# Patient Record
Sex: Male | Born: 1960 | Race: Black or African American | Hispanic: No | Smoking: Current every day smoker
Health system: Southern US, Community
[De-identification: ages and names within clinical notes are randomized; demographics above are authoritative.]

## PROBLEM LIST (undated history)

## (undated) DIAGNOSIS — K219 Gastro-esophageal reflux disease without esophagitis: Secondary | ICD-10-CM

## (undated) DIAGNOSIS — I639 Cerebral infarction, unspecified: Secondary | ICD-10-CM

## (undated) DIAGNOSIS — I10 Essential (primary) hypertension: Secondary | ICD-10-CM

## (undated) DIAGNOSIS — E119 Type 2 diabetes mellitus without complications: Secondary | ICD-10-CM

## (undated) DIAGNOSIS — E7800 Pure hypercholesterolemia, unspecified: Secondary | ICD-10-CM

## (undated) DIAGNOSIS — E785 Hyperlipidemia, unspecified: Secondary | ICD-10-CM

## (undated) DIAGNOSIS — F32A Depression, unspecified: Secondary | ICD-10-CM

## (undated) DIAGNOSIS — F419 Anxiety disorder, unspecified: Secondary | ICD-10-CM

## (undated) DIAGNOSIS — Z87442 Personal history of urinary calculi: Secondary | ICD-10-CM

## (undated) DIAGNOSIS — R0600 Dyspnea, unspecified: Secondary | ICD-10-CM

## (undated) HISTORY — PX: CAROTID STENT: SHX1301

## (undated) HISTORY — PX: EYE SURGERY: SHX253

---

## 2019-09-04 DIAGNOSIS — I639 Cerebral infarction, unspecified: Secondary | ICD-10-CM

## 2019-09-04 HISTORY — DX: Cerebral infarction, unspecified: I63.9

## 2020-04-20 ENCOUNTER — Emergency Department (HOSPITAL_COMMUNITY): Payer: Medicaid Other

## 2020-04-20 ENCOUNTER — Emergency Department (HOSPITAL_COMMUNITY): Payer: Medicaid Other | Admitting: Registered Nurse

## 2020-04-20 ENCOUNTER — Encounter (HOSPITAL_COMMUNITY)
Admission: EM | Disposition: A | Discharge status: Rehabilitation Facility | Payer: Self-pay | Source: Home / Self Care | Attending: Neurology

## 2020-04-20 ENCOUNTER — Inpatient Hospital Stay (HOSPITAL_COMMUNITY)
Admission: EM | Admit: 2020-04-20 | Discharge: 2020-04-23 | DRG: 023 | Disposition: A | Discharge status: Rehabilitation Facility | Payer: Medicaid Other | Attending: Neurology | Admitting: Neurology

## 2020-04-20 ENCOUNTER — Encounter (HOSPITAL_COMMUNITY): Payer: Self-pay | Admitting: Neurology

## 2020-04-20 ENCOUNTER — Inpatient Hospital Stay (HOSPITAL_COMMUNITY): Payer: Medicaid Other

## 2020-04-20 DIAGNOSIS — E876 Hypokalemia: Secondary | ICD-10-CM | POA: Diagnosis not present

## 2020-04-20 DIAGNOSIS — E1122 Type 2 diabetes mellitus with diabetic chronic kidney disease: Secondary | ICD-10-CM | POA: Diagnosis present

## 2020-04-20 DIAGNOSIS — I129 Hypertensive chronic kidney disease with stage 1 through stage 4 chronic kidney disease, or unspecified chronic kidney disease: Secondary | ICD-10-CM | POA: Diagnosis present

## 2020-04-20 DIAGNOSIS — N1832 Chronic kidney disease, stage 3b: Secondary | ICD-10-CM | POA: Diagnosis present

## 2020-04-20 DIAGNOSIS — F1410 Cocaine abuse, uncomplicated: Secondary | ICD-10-CM | POA: Diagnosis present

## 2020-04-20 DIAGNOSIS — I63511 Cerebral infarction due to unspecified occlusion or stenosis of right middle cerebral artery: Principal | ICD-10-CM | POA: Diagnosis present

## 2020-04-20 DIAGNOSIS — H5347 Heteronymous bilateral field defects: Secondary | ICD-10-CM | POA: Diagnosis present

## 2020-04-20 DIAGNOSIS — F17200 Nicotine dependence, unspecified, uncomplicated: Secondary | ICD-10-CM | POA: Diagnosis present

## 2020-04-20 DIAGNOSIS — R29714 NIHSS score 14: Secondary | ICD-10-CM | POA: Diagnosis present

## 2020-04-20 DIAGNOSIS — G8194 Hemiplegia, unspecified affecting left nondominant side: Secondary | ICD-10-CM | POA: Diagnosis present

## 2020-04-20 DIAGNOSIS — Z20822 Contact with and (suspected) exposure to covid-19: Secondary | ICD-10-CM | POA: Diagnosis present

## 2020-04-20 DIAGNOSIS — E785 Hyperlipidemia, unspecified: Secondary | ICD-10-CM | POA: Diagnosis present

## 2020-04-20 DIAGNOSIS — R531 Weakness: Secondary | ICD-10-CM | POA: Diagnosis present

## 2020-04-20 DIAGNOSIS — I609 Nontraumatic subarachnoid hemorrhage, unspecified: Secondary | ICD-10-CM | POA: Diagnosis present

## 2020-04-20 DIAGNOSIS — R29810 Facial weakness: Secondary | ICD-10-CM | POA: Diagnosis present

## 2020-04-20 DIAGNOSIS — I69354 Hemiplegia and hemiparesis following cerebral infarction affecting left non-dominant side: Secondary | ICD-10-CM | POA: Diagnosis not present

## 2020-04-20 HISTORY — PX: IR CT HEAD LTD: IMG2386

## 2020-04-20 HISTORY — PX: IR US GUIDE VASC ACCESS RIGHT: IMG2390

## 2020-04-20 HISTORY — PX: IR ANGIO EXTRACRAN SEL COM CAROTID INNOMINATE UNI L MOD SED: IMG5355

## 2020-04-20 HISTORY — PX: IR PERCUTANEOUS ART THROMBECTOMY/INFUSION INTRACRANIAL INC DIAG ANGIO: IMG6087

## 2020-04-20 HISTORY — PX: RADIOLOGY WITH ANESTHESIA: SHX6223

## 2020-04-20 LAB — URINALYSIS, ROUTINE W REFLEX MICROSCOPIC
Bacteria, UA: NONE SEEN
Bilirubin Urine: NEGATIVE
Glucose, UA: 150 mg/dL — AB
Ketones, ur: NEGATIVE mg/dL
Leukocytes,Ua: NEGATIVE
Nitrite: NEGATIVE
Protein, ur: >=300 mg/dL — AB
RBC / HPF: >50 RBC/hpf — ABNORMAL HIGH (ref 0–5)
Specific Gravity, Urine: 1.032 — ABNORMAL HIGH (ref 1.005–1.030)
pH: 5 (ref 5.0–8.0)

## 2020-04-20 LAB — RAPID URINE DRUG SCREEN, HOSP PERFORMED
Amphetamines: NOT DETECTED
Barbiturates: NOT DETECTED
Benzodiazepines: NOT DETECTED
Cocaine: POSITIVE — AB
Opiates: NOT DETECTED
Tetrahydrocannabinol: POSITIVE — AB

## 2020-04-20 LAB — CBC
HCT: 39.8 % (ref 39.0–52.0)
HCT: 41.1 % (ref 39.0–52.0)
Hemoglobin: 13.4 g/dL (ref 13.0–17.0)
Hemoglobin: 13.6 g/dL (ref 13.0–17.0)
MCH: 31.4 pg (ref 26.0–34.0)
MCH: 31.7 pg (ref 26.0–34.0)
MCHC: 33.1 g/dL (ref 30.0–36.0)
MCHC: 33.7 g/dL (ref 30.0–36.0)
MCV: 94.1 fL (ref 80.0–100.0)
MCV: 94.9 fL (ref 80.0–100.0)
Platelets: 338 10*3/uL (ref 150–400)
Platelets: 356 10*3/uL (ref 150–400)
RBC: 4.23 MIL/uL (ref 4.22–5.81)
RBC: 4.33 MIL/uL (ref 4.22–5.81)
RDW: 13.3 % (ref 11.5–15.5)
RDW: 13.4 % (ref 11.5–15.5)
WBC: 10.4 10*3/uL (ref 4.0–10.5)
WBC: 7.2 10*3/uL (ref 4.0–10.5)
nRBC: 0 % (ref 0.0–0.2)
nRBC: 0 % (ref 0.0–0.2)

## 2020-04-20 LAB — COMPREHENSIVE METABOLIC PANEL
ALT: 18 U/L (ref 0–44)
ALT: 23 U/L (ref 0–44)
AST: 27 U/L (ref 15–41)
AST: 34 U/L (ref 15–41)
Albumin: 2.6 g/dL — ABNORMAL LOW (ref 3.5–5.0)
Albumin: 2.5 g/dL — ABNORMAL LOW (ref 3.5–5.0)
Alkaline Phosphatase: 67 U/L (ref 38–126)
Alkaline Phosphatase: 68 U/L (ref 38–126)
Anion gap: 7 (ref 5–15)
Anion gap: 7 (ref 5–15)
BUN: 17 mg/dL (ref 6–20)
BUN: 17 mg/dL (ref 6–20)
CO2: 26 mmol/L (ref 22–32)
CO2: 23 mmol/L (ref 22–32)
Calcium: 8.6 mg/dL — ABNORMAL LOW (ref 8.9–10.3)
Calcium: 8 mg/dL — ABNORMAL LOW (ref 8.9–10.3)
Chloride: 102 mmol/L (ref 98–111)
Chloride: 102 mmol/L (ref 98–111)
Creatinine, Ser: 2.1 mg/dL — ABNORMAL HIGH (ref 0.61–1.24)
Creatinine, Ser: 2.01 mg/dL — ABNORMAL HIGH (ref 0.61–1.24)
GFR, Estimated: 36 mL/min — ABNORMAL LOW (ref >60)
GFR, Estimated: 38 mL/min — ABNORMAL LOW (ref >60)
Glucose, Bld: 231 mg/dL — ABNORMAL HIGH (ref 70–99)
Glucose, Bld: 316 mg/dL — ABNORMAL HIGH (ref 70–99)
Potassium: 4.1 mmol/L (ref 3.5–5.1)
Potassium: 4.3 mmol/L (ref 3.5–5.1)
Sodium: 135 mmol/L (ref 135–145)
Sodium: 132 mmol/L — ABNORMAL LOW (ref 135–145)
Total Bilirubin: 0.7 mg/dL (ref 0.3–1.2)
Total Bilirubin: 0.6 mg/dL (ref 0.3–1.2)
Total Protein: 5.9 g/dL — ABNORMAL LOW (ref 6.5–8.1)
Total Protein: 5.4 g/dL — ABNORMAL LOW (ref 6.5–8.1)

## 2020-04-20 LAB — APTT: aPTT: 32 seconds (ref 24–36)

## 2020-04-20 LAB — RESP PANEL BY RT-PCR (FLU A&B, COVID) ARPGX2
Influenza A by PCR: NEGATIVE
Influenza B by PCR: NEGATIVE
SARS Coronavirus 2 by RT PCR: NEGATIVE

## 2020-04-20 LAB — ECHOCARDIOGRAM COMPLETE BUBBLE STUDY
Area-P 1/2: 2.87 cm2
Calc EF: 48.1 %
S' Lateral: 2.5 cm
Single Plane A2C EF: 49.6 %
Single Plane A4C EF: 47 %

## 2020-04-20 LAB — ETHANOL: Alcohol, Ethyl (B): <10 mg/dL (ref <10)

## 2020-04-20 LAB — LIPID PANEL
Cholesterol: 229 mg/dL — ABNORMAL HIGH (ref 0–200)
HDL: 83 mg/dL (ref >40)
LDL Cholesterol: 98 mg/dL (ref 0–99)
Total CHOL/HDL Ratio: 2.8 RATIO
Triglycerides: 238 mg/dL — ABNORMAL HIGH (ref <150)
VLDL: 48 mg/dL — ABNORMAL HIGH (ref 0–40)

## 2020-04-20 LAB — DIFFERENTIAL
Abs Immature Granulocytes: 0.02 10*3/uL (ref 0.00–0.07)
Basophils Absolute: 0 10*3/uL (ref 0.0–0.1)
Basophils Relative: 0 %
Eosinophils Absolute: 0.2 10*3/uL (ref 0.0–0.5)
Eosinophils Relative: 3 %
Immature Granulocytes: 0 %
Lymphocytes Relative: 22 %
Lymphs Abs: 1.6 10*3/uL (ref 0.7–4.0)
Monocytes Absolute: 0.5 10*3/uL (ref 0.1–1.0)
Monocytes Relative: 7 %
Neutro Abs: 4.8 10*3/uL (ref 1.7–7.7)
Neutrophils Relative %: 68 %

## 2020-04-20 LAB — I-STAT CHEM 8, ED
BUN: 19 mg/dL (ref 6–20)
Calcium, Ion: 1.05 mmol/L — ABNORMAL LOW (ref 1.15–1.40)
Chloride: 101 mmol/L (ref 98–111)
Creatinine, Ser: 1.9 mg/dL — ABNORMAL HIGH (ref 0.61–1.24)
Glucose, Bld: 227 mg/dL — ABNORMAL HIGH (ref 70–99)
HCT: 40 % (ref 39.0–52.0)
Hemoglobin: 13.6 g/dL (ref 13.0–17.0)
Potassium: 3.9 mmol/L (ref 3.5–5.1)
Sodium: 139 mmol/L (ref 135–145)
TCO2: 26 mmol/L (ref 22–32)

## 2020-04-20 LAB — GLUCOSE, CAPILLARY
Glucose-Capillary: 242 mg/dL — ABNORMAL HIGH (ref 70–99)
Glucose-Capillary: 278 mg/dL — ABNORMAL HIGH (ref 70–99)
Glucose-Capillary: 268 mg/dL — ABNORMAL HIGH (ref 70–99)
Glucose-Capillary: 170 mg/dL — ABNORMAL HIGH (ref 70–99)
Glucose-Capillary: 62 mg/dL — ABNORMAL LOW (ref 70–99)

## 2020-04-20 LAB — CBG MONITORING, ED: Glucose-Capillary: 208 mg/dL — ABNORMAL HIGH (ref 70–99)

## 2020-04-20 LAB — PROTIME-INR
INR: 0.9 (ref 0.8–1.2)
Prothrombin Time: 11.7 seconds (ref 11.4–15.2)

## 2020-04-20 LAB — HEMOGLOBIN A1C
Hgb A1c MFr Bld: 7.3 % — ABNORMAL HIGH (ref 4.8–5.6)
Mean Plasma Glucose: 162.81 mg/dL

## 2020-04-20 LAB — MRSA PCR SCREENING: MRSA by PCR: NEGATIVE

## 2020-04-20 SURGERY — IR WITH ANESTHESIA
Anesthesia: General

## 2020-04-20 MED ORDER — VERAPAMIL HCL 2.5 MG/ML IV SOLN
INTRAVENOUS | Status: AC
  Filled 2020-04-20: qty 2

## 2020-04-20 MED ORDER — NITROGLYCERIN 1 MG/10 ML FOR IR/CATH LAB
INTRA_ARTERIAL | Status: AC
  Filled 2020-04-20: qty 10

## 2020-04-20 MED ORDER — LACTATED RINGERS IV SOLN: INTRAVENOUS | Status: DC | PRN

## 2020-04-20 MED ORDER — ASPIRIN 81 MG PO CHEW
81.0000 mg | CHEWABLE_TABLET | Freq: Every day | ORAL | Status: DC
  Administered 2020-04-21: 81 mg via ORAL
  Filled 2020-04-20: qty 1

## 2020-04-20 MED ORDER — CLEVIDIPINE BUTYRATE 0.5 MG/ML IV EMUL
INTRAVENOUS | Status: AC
  Filled 2020-04-20: qty 50

## 2020-04-20 MED ORDER — ESMOLOL HCL 100 MG/10ML IV SOLN
INTRAVENOUS | Status: DC | PRN
  Administered 2020-04-20 (×3): 20 mg via INTRAVENOUS

## 2020-04-20 MED ORDER — IOHEXOL 300 MG/ML  SOLN: 150.0000 mL | Freq: Once | INTRAMUSCULAR | Status: DC | PRN

## 2020-04-20 MED ORDER — FENTANYL CITRATE (PF) 250 MCG/5ML IJ SOLN
INTRAMUSCULAR | Status: AC
  Filled 2020-04-20: qty 5

## 2020-04-20 MED ORDER — IOHEXOL 350 MG/ML SOLN
100.0000 mL | Freq: Once | INTRAVENOUS | Status: AC | PRN
  Administered 2020-04-20: 100 mL via INTRAVENOUS

## 2020-04-20 MED ORDER — ASPIRIN 300 MG RE SUPP: 300.0000 mg | Freq: Once | RECTAL | Status: DC

## 2020-04-20 MED ORDER — CHLORHEXIDINE GLUCONATE CLOTH 2 % EX PADS
6.0000 | MEDICATED_PAD | Freq: Every day | CUTANEOUS | Status: DC
  Administered 2020-04-20 – 2020-04-23 (×4): 6 via TOPICAL

## 2020-04-20 MED ORDER — ROCURONIUM BROMIDE 10 MG/ML (PF) SYRINGE
PREFILLED_SYRINGE | INTRAVENOUS | Status: DC | PRN
  Administered 2020-04-20: 20 mg via INTRAVENOUS
  Administered 2020-04-20: 10 mg via INTRAVENOUS
  Administered 2020-04-20: 50 mg via INTRAVENOUS

## 2020-04-20 MED ORDER — DEXAMETHASONE SODIUM PHOSPHATE 10 MG/ML IJ SOLN
INTRAMUSCULAR | Status: DC | PRN
  Administered 2020-04-20: 10 mg via INTRAVENOUS

## 2020-04-20 MED ORDER — CLOPIDOGREL BISULFATE 300 MG PO TABS
ORAL_TABLET | ORAL | Status: AC
  Filled 2020-04-20: qty 1

## 2020-04-20 MED ORDER — CLEVIDIPINE BUTYRATE 0.5 MG/ML IV EMUL
0.0000 mg/h | INTRAVENOUS | Status: DC
  Administered 2020-04-20: 16 mg/h via INTRAVENOUS
  Administered 2020-04-20: 18 mg/h via INTRAVENOUS
  Administered 2020-04-20: 16 mg/h via INTRAVENOUS
  Administered 2020-04-20: 15 mg/h via INTRAVENOUS
  Administered 2020-04-21 (×5): 16 mg/h via INTRAVENOUS
  Administered 2020-04-21: 4 mg/h via INTRAVENOUS
  Administered 2020-04-22: 1 mg/h via INTRAVENOUS
  Filled 2020-04-20 (×10): qty 100
  Filled 2020-04-20: qty 50
  Filled 2020-04-20 (×2): qty 100
  Filled 2020-04-20: qty 50
  Filled 2020-04-20: qty 100

## 2020-04-20 MED ORDER — SODIUM CHLORIDE 0.9 % IV SOLN: INTRAVENOUS | Status: DC | PRN

## 2020-04-20 MED ORDER — INSULIN ASPART 100 UNIT/ML ~~LOC~~ SOLN
0.0000 [IU] | SUBCUTANEOUS | Status: DC
  Administered 2020-04-20: 3 [IU] via SUBCUTANEOUS
  Administered 2020-04-20: 8 [IU] via SUBCUTANEOUS
  Administered 2020-04-21 (×4): 2 [IU] via SUBCUTANEOUS
  Administered 2020-04-22: 5 [IU] via SUBCUTANEOUS
  Administered 2020-04-22 – 2020-04-23 (×2): 3 [IU] via SUBCUTANEOUS
  Administered 2020-04-23 (×2): 2 [IU] via SUBCUTANEOUS

## 2020-04-20 MED ORDER — ACETAMINOPHEN 160 MG/5ML PO SOLN: 650.0000 mg | ORAL | Status: DC | PRN

## 2020-04-20 MED ORDER — IOHEXOL 240 MG/ML SOLN
150.0000 mL | Freq: Once | INTRAMUSCULAR | Status: AC | PRN
  Administered 2020-04-20: 25 mL via INTRA_ARTERIAL

## 2020-04-20 MED ORDER — SUGAMMADEX SODIUM 200 MG/2ML IV SOLN
INTRAVENOUS | Status: DC | PRN
  Administered 2020-04-20: 200 mg via INTRAVENOUS

## 2020-04-20 MED ORDER — FENTANYL CITRATE (PF) 100 MCG/2ML IJ SOLN: 25.0000 ug | INTRAMUSCULAR | Status: DC | PRN

## 2020-04-20 MED ORDER — SENNOSIDES-DOCUSATE SODIUM 8.6-50 MG PO TABS
1.0000 | ORAL_TABLET | Freq: Every evening | ORAL | Status: DC | PRN
  Administered 2020-04-21 – 2020-04-22 (×2): 1 via ORAL
  Filled 2020-04-20 (×2): qty 1

## 2020-04-20 MED ORDER — ACETAMINOPHEN 325 MG PO TABS
650.0000 mg | ORAL_TABLET | ORAL | Status: DC | PRN
  Administered 2020-04-21 – 2020-04-22 (×2): 650 mg via ORAL
  Filled 2020-04-20 (×2): qty 2

## 2020-04-20 MED ORDER — LIDOCAINE 2% (20 MG/ML) 5 ML SYRINGE
INTRAMUSCULAR | Status: DC | PRN
  Administered 2020-04-20: 50 mg via INTRAVENOUS

## 2020-04-20 MED ORDER — CLEVIDIPINE BUTYRATE 0.5 MG/ML IV EMUL
INTRAVENOUS | Status: DC | PRN
  Administered 2020-04-20: 1 mg/h via INTRAVENOUS

## 2020-04-20 MED ORDER — ACETAMINOPHEN 650 MG RE SUPP: 650.0000 mg | RECTAL | Status: DC | PRN

## 2020-04-20 MED ORDER — SODIUM CHLORIDE 0.9 % IV SOLN: INTRAVENOUS | Status: DC

## 2020-04-20 MED ORDER — TIROFIBAN HCL IN NACL 5-0.9 MG/100ML-% IV SOLN
INTRAVENOUS | Status: AC
  Filled 2020-04-20: qty 100

## 2020-04-20 MED ORDER — ONDANSETRON HCL 4 MG/2ML IJ SOLN
INTRAMUSCULAR | Status: DC | PRN
  Administered 2020-04-20: 4 mg via INTRAVENOUS

## 2020-04-20 MED ORDER — HYDRALAZINE HCL 25 MG PO TABS
25.0000 mg | ORAL_TABLET | Freq: Every day | ORAL | Status: DC
  Administered 2020-04-20 – 2020-04-21 (×2): 25 mg via ORAL
  Filled 2020-04-20 (×2): qty 1

## 2020-04-20 MED ORDER — SODIUM CHLORIDE 0.9% FLUSH
10.0000 mL | Freq: Once | INTRAVENOUS | Status: AC
  Administered 2020-04-20: 10 mL via INTRAVENOUS

## 2020-04-20 MED ORDER — DEXTROSE 50 % IV SOLN
INTRAVENOUS | Status: AC
  Administered 2020-04-20: 25 mL
  Filled 2020-04-20: qty 50

## 2020-04-20 MED ORDER — FENTANYL CITRATE (PF) 250 MCG/5ML IJ SOLN
INTRAMUSCULAR | Status: DC | PRN
  Administered 2020-04-20: 50 ug via INTRAVENOUS

## 2020-04-20 MED ORDER — PHENYLEPHRINE 40 MCG/ML (10ML) SYRINGE FOR IV PUSH (FOR BLOOD PRESSURE SUPPORT)
PREFILLED_SYRINGE | INTRAVENOUS | Status: DC | PRN
  Administered 2020-04-20 (×2): 80 ug via INTRAVENOUS

## 2020-04-20 MED ORDER — PHENYLEPHRINE HCL-NACL 10-0.9 MG/250ML-% IV SOLN
INTRAVENOUS | Status: DC | PRN
  Administered 2020-04-20: 25 ug/min via INTRAVENOUS

## 2020-04-20 MED ORDER — TICAGRELOR 90 MG PO TABS
ORAL_TABLET | ORAL | Status: AC
  Filled 2020-04-20: qty 2

## 2020-04-20 MED ORDER — CEFAZOLIN SODIUM-DEXTROSE 2-3 GM-%(50ML) IV SOLR
INTRAVENOUS | Status: DC | PRN
  Administered 2020-04-20: 2 g via INTRAVENOUS

## 2020-04-20 MED ORDER — CEFAZOLIN SODIUM-DEXTROSE 2-4 GM/100ML-% IV SOLN
INTRAVENOUS | Status: AC
  Filled 2020-04-20: qty 100

## 2020-04-20 MED ORDER — PROPOFOL 10 MG/ML IV BOLUS
INTRAVENOUS | Status: DC | PRN
  Administered 2020-04-20: 200 mg via INTRAVENOUS
  Administered 2020-04-20: 25 mg via INTRAVENOUS

## 2020-04-20 MED ORDER — STROKE: EARLY STAGES OF RECOVERY BOOK: Freq: Once | Status: DC

## 2020-04-20 MED ORDER — CANGRELOR TETRASODIUM 50 MG IV SOLR
INTRAVENOUS | Status: AC
  Filled 2020-04-20: qty 50

## 2020-04-20 MED ORDER — ASPIRIN 81 MG PO CHEW
CHEWABLE_TABLET | ORAL | Status: AC
  Filled 2020-04-20: qty 1

## 2020-04-20 MED ORDER — AMLODIPINE BESYLATE 10 MG PO TABS
10.0000 mg | ORAL_TABLET | Freq: Every day | ORAL | Status: DC
  Administered 2020-04-20 – 2020-04-23 (×4): 10 mg via ORAL
  Filled 2020-04-20 (×4): qty 1

## 2020-04-20 MED ORDER — IOHEXOL 240 MG/ML SOLN
150.0000 mL | Freq: Once | INTRAMUSCULAR | Status: AC | PRN
  Administered 2020-04-20: 75 mL via INTRA_ARTERIAL

## 2020-04-20 MED ORDER — EPTIFIBATIDE 20 MG/10ML IV SOLN
INTRAVENOUS | Status: AC
  Filled 2020-04-20: qty 10

## 2020-04-20 MED ORDER — IOHEXOL 240 MG/ML SOLN
INTRAMUSCULAR | Status: AC
  Filled 2020-04-20: qty 200

## 2020-04-20 MED ORDER — ENOXAPARIN SODIUM 40 MG/0.4ML ~~LOC~~ SOLN: 40.0000 mg | SUBCUTANEOUS | Status: DC

## 2020-04-20 MED ORDER — SUCCINYLCHOLINE CHLORIDE 200 MG/10ML IV SOSY
PREFILLED_SYRINGE | INTRAVENOUS | Status: DC | PRN
  Administered 2020-04-20: 100 mg via INTRAVENOUS

## 2020-04-20 MED ORDER — CLEVIDIPINE BUTYRATE 0.5 MG/ML IV EMUL
INTRAVENOUS | Status: AC
  Administered 2020-04-20: 15 mg
  Filled 2020-04-20: qty 50
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
## 2020-04-20 NOTE — ED Notes (Signed)
Patient taken to IR

## 2020-04-20 NOTE — Evaluation (Signed)
Clinical/Bedside Swallow Evaluation Patient Details  Name: Nataniel Pessolano MRN: [id number] Date of Birth: [birth year]  Today's Date: 04/20/2020 Time: SLP Start Time (ACUTE ONLY): [id number] SLP Stop Time (ACUTE ONLY): 1601 SLP Time Calculation (min) (ACUTE ONLY): 13.88 min  Past Medical History:  Past Medical History:  Diagnosis Date  . Stroke [facility]) 09/2019   Past Surgical History:  Past Surgical History:  Procedure Laterality Date  . IR ANGIO EXTRACRAN SEL COM CAROTID INNOMINATE UNI L MOD SED  04/20/2020  . IR CT HEAD LTD  04/20/2020  . IR PERCUTANEOUS ART THROMBECTOMY/INFUSION INTRACRANIAL INC DIAG ANGIO  04/20/2020  . IR US GUIDE VASC ACCESS RIGHT  04/20/2020   HPI:  Pt is a 60 y.o. male with PMH significant for prior stroke in august 2021 with very mild residual deficit after finishing rehab. Pt presented with acute onset left-sided weakness. CT head 3/18: Asymmetric hyperdensity involving right M2/M3 branches, concerning for thrombus. CTA 3/18: abrupt occlusion of the proximal right ICA just beyond the right bifurcation. 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 Pt s/p cerebral arteriogram with emergent mechanical thrombectomy of right ICA bifurcation occlusion and right MCA M2 occlusion and revascularization on 3/18. Pt passed the [facility] swallow screen on 3/18 and swallow evaluation was subsequently ordered due to anterior spillage of puree as noted by provider.   Assessment / Plan / Recommendation Clinical Impression  Pt was seen for bedside swallow evaluation. He reported anterior spillage and oral residue with since prior CVA, but denied any s/sx of aspiration with p.o. intake. Oral mechanism exam was significant for left-sided facial weakness and reduced lingual ROM. Dentition was reduced, but adequate for mastication.  He demonstrated symptoms of oral phase dysphagia characterized by left-sided anterior spillage of thin liquids via cup  and mild oral residue. Mastication was [facility] and complete oral clearance was facilitated with secondary swallows and/or a liquid wash. Pt reported that hard lettuce occasionally "gets hung up", but no symptoms of pharyngeal dysphagia were observed during the evaluation even when challenged. A regular texture diet with thin liquids is recommended at this time and SLP will follow to assess diet tolerance. SLP Visit Diagnosis: Dysphagia, unspecified (R13.10)    Aspiration Risk  Mild aspiration risk    Diet Recommendation Regular;Thin liquid   Liquid Administration via: Cup;Straw Medication Administration: Whole meds with puree Supervision: Staff to assist with self feeding Compensations: Slow rate;Small sips/bites Postural Changes: Seated upright at 90 degrees    Other  Recommendations Oral Care Recommendations: Oral care BID   Follow up Recommendations  (TBD)      Frequency and Duration min 1 x/week  1 week       Prognosis Prognosis for Safe Diet Advancement: Good Barriers to Reach Goals: Time post onset      Swallow Study   General Date of Onset: 04/19/20 HPI: Pt is a 60 y.o. male with PMH significant for prior stroke in august 2021 with very mild residual deficit after finishing rehab. Pt presented with acute onset left-sided weakness. CT head 3/18: Asymmetric hyperdensity involving right M2/M3 branches, concerning for thrombus. CTA 3/18: abrupt occlusion of the proximal right ICA just beyond the right bifurcation. 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 Pt s/p cerebral arteriogram with emergent mechanical thrombectomy of right ICA bifurcation occlusion and right MCA M2 occlusion and revascularization on 3/18. Pt passed the [facility] swallow screen on 3/18 and swallow evaluation was subsequently ordered due  to anterior spillage of puree as noted by provider. Type of Study: Bedside Swallow Evaluation Previous Swallow Assessment:  None Diet Prior to this Study: Regular;Thin liquids Temperature Spikes Noted: No Respiratory Status: Room air History of Recent Intubation: No Behavior/Cognition: Alert;Cooperative;Pleasant mood Oral Cavity Assessment: Within Functional Limits Oral Care Completed by SLP: No Oral Cavity - Dentition: Missing dentition Vision: Functional for self-feeding Self-Feeding Abilities: Needs assist Patient Positioning: Upright in bed Baseline Vocal Quality: Normal Volitional Cough: Strong Volitional Swallow: Able to elicit    Oral/Motor/Sensory Function Overall Oral Motor/Sensory Function: Mild impairment Facial ROM: Suspected CN VII (facial) dysfunction;Reduced left Facial Symmetry: Abnormal symmetry left;Suspected CN VII (facial) dysfunction Facial Strength: Reduced left;Suspected CN VII (facial) dysfunction Facial Sensation: Within Functional Limits Lingual ROM: Reduced left;Suspected CN XII (hypoglossal) dysfunction Lingual Symmetry: Abnormal symmetry left;Suspected CN XII (hypoglossal) dysfunction Lingual Strength: Reduced;Suspected CN XII (hypoglossal) dysfunction Lingual Sensation: Within Functional Limits Velum: Within Functional Limits Mandible: Within Functional Limits   Ice Chips Ice chips: Within functional limits Presentation: Spoon   Thin Liquid Thin Liquid: Impaired Presentation: Straw;Cup Oral Phase Impairments: Reduced labial seal Oral Phase Functional Implications: Left anterior spillage    Nectar Thick Nectar Thick Liquid: Not tested   Honey Thick Honey Thick Liquid: Not tested   Puree Puree: Impaired Presentation: Spoon Oral Phase Impairments: Reduced labial seal Oral Phase Functional Implications:  (residue in left lateral sulcus)   Solid     Solid: Within functional limits Presentation: Self Fed     Shanika I. Hardin Negus, [place], [place] Office number [phone] Pager [phone]   Horton Marshall 04/20/2020,4:43  PM

## 2020-04-20 NOTE — Progress Notes (Signed)
  Echocardiogram 2D Echocardiogram with bubble has been performed.  Bobby Branch M 04/20/2020, 3:49 PM

## 2020-04-20 NOTE — Anesthesia Preprocedure Evaluation (Addendum)
Anesthesia Evaluation  Patient identified by MRN, date of birth, ID band Patient awake    Reviewed: Allergy & Precautions, H&P , NPO status , Patient's Chart, lab work & pertinent test results  Airway Mallampati: II  TM Distance: >3 FB Neck ROM: Full    Dental no notable dental hx. (+) Teeth Intact, Dental Advisory Given   Pulmonary neg pulmonary ROS,    Pulmonary exam normal breath sounds clear to auscultation       Cardiovascular negative cardio ROS   Rhythm:Regular Rate:Normal     Neuro/Psych CVA, Residual Symptoms negative psych ROS   GI/Hepatic negative GI ROS, Neg liver ROS,   Endo/Other  negative endocrine ROS  Renal/GU negative Renal ROS  negative genitourinary   Musculoskeletal   Abdominal   Peds  Hematology negative hematology ROS (+)   Anesthesia Other Findings   Reproductive/Obstetrics negative OB ROS                            Anesthesia Physical Anesthesia Plan  ASA: III and emergent  Anesthesia Plan: General   Post-op Pain Management:    Induction: Intravenous, Rapid sequence and Cricoid pressure planned  PONV Risk Score and Plan: 2 and Ondansetron and Dexamethasone  Airway Management Planned: Oral ETT  Additional Equipment: Arterial line  Intra-op Plan:   Post-operative Plan: Extubation in OR  Informed Consent: I have reviewed the patients History and Physical, chart, labs and discussed the procedure including the risks, benefits and alternatives for the proposed anesthesia with the patient or authorized representative who has indicated his/her understanding and acceptance.     Dental advisory given  Plan Discussed with: CRNA  Anesthesia Plan Comments:        Anesthesia Quick Evaluation

## 2020-04-20 NOTE — Progress Notes (Signed)
NeuroInterventional Radiology  Pre-Procedure Note  History: Bobby Branch a 60 y.o.malewith PMH significant for prior stroke in august 2021 with very mild residual deficit after finishing rehab who presents with acute onset Left sided weakness with extinction and a LKW of 1830.  Patient was dropped off at his apartment at [place] and was found down by family and [employer] with left sided weakness. He was brought in as a stroke code.   NIHSS:  14  CT ASPECTS: 10 CTA:   Tandem occlusion, right ICA and right MCA/M2 CTP:   Core (CBF) = 15cc, Penumbra (Tmax) = 82cc   I have discussed the case with Dr. Lorrin Goodell of Stroke Neurology.  Given the patient's symptoms, imaging findings, baseline function, we agree they are an appropriate candidate for attempt for mechanical thrombectomy.    The risks and benefits of the procedure were discussed with the patient/patient's family, with specific risks including: bleeding, infection, arterial injury/dissection, contrast reaction, kidney injury, need for further procedure/surgery, neurologic deficit, 10-15% risk of intracranial hemorrhage, cardiopulmonary collapse, death. All questions were answered.  The patient/family would like to proceed with attempt at thrombectomy.   Given the right ICA occlusion, there will likely be need for acute carotid stent.   Plan for cerebral angiogram and attempt at mechanical thrombectomy.   Signed,   Dulcy Fanny. Earleen Newport, DO
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
## 2020-04-20 NOTE — Progress Notes (Signed)
OT Cancellation Note  Patient Details Name: Bobby Branch MRN: [id number] DOB: 06/16/1960   Cancelled Treatment:    Reason Eval/Treat Not Completed: Patient not medically ready;Patient at procedure or test/ unavailable (IR)  WARD,HILLARY 04/20/2020, 8:43 AM  Maurie Boettcher, OT/L   Acute OT Clinical Specialist Acute Rehabilitation Services Pager [phone] Office [phone]

## 2020-04-20 NOTE — Anesthesia Postprocedure Evaluation (Signed)
Anesthesia Post Note  Patient: Bobby Branch  Procedure(s) Performed: IR WITH ANESTHESIA (N/A )     Patient location during evaluation: PACU Anesthesia Type: General Level of consciousness: awake and alert Pain management: pain level controlled Vital Signs Assessment: post-procedure vital signs reviewed and stable Respiratory status: spontaneous breathing, nonlabored ventilation, respiratory function stable and patient connected to nasal cannula oxygen Cardiovascular status: blood pressure returned to baseline and stable Postop Assessment: no apparent nausea or vomiting Anesthetic complications: no   No complications documented.  Last Vitals:  Vitals:   04/20/20 0709 04/20/20 0724  BP: 136/82 137/78  Pulse: 84 86  Resp: 14 12  Temp:    SpO2: 100% 100%    Last Pain:  Vitals:   04/20/20 0709  TempSrc:   PainSc: 0-No pain                 Osie Merkin,W. EDMOND

## 2020-04-20 NOTE — Anesthesia Procedure Notes (Signed)
Arterial Line Insertion Start/End3/18/2022 1:25 AM, 04/20/2020 1:28 AM Performed by: Jearld Pies, CRNA, CRNA  Patient location: OOR procedure area. Emergency situation Patient sedated Right, radial was placed Catheter size: 20 G Hand hygiene performed  and Seldinger technique used Allen's test indicative of satisfactory collateral circulation Attempts: 1 Procedure performed without using ultrasound guided technique. Following insertion, dressing applied and Biopatch. Post procedure assessment: normal  Patient tolerated the procedure well with no immediate complications.

## 2020-04-20 NOTE — Progress Notes (Signed)
Referring Physician(s): Code stroke- Donnetta Simpers (neurology)  Supervising Physician: Corrie Mckusick  Patient Status:  [facility] - In-pt  Chief Complaint: Stroke follow-up  Subjective:  History of acute CVA s/p cerebral arteriogram with emergent mechanical thrombectomy of right ICA bifurcation occlusion and right MCA M2 occlusion achieving a TICI 3 revascularization via right femoral approach 04/20/2020 by Dr. Earleen Newport. Patient laying in bed resting comfortably. He responds to voice and follows simple commands. No complaints. Left facial droop. Can spontaneously move right side, minimal movements of left side (left hand grip intact, LLE withdraws from pain). Right femoral puncture site c/d/i.   Allergies: Patient has no known allergies.  Medications: Prior to Admission medications   Not on File     Vital Signs: BP 137/78 (BP Location: Right Arm)   Pulse 76   Temp 97.6 F (36.4 C)   Resp 14   SpO2 98%   Physical Exam Vitals and nursing note reviewed.  Constitutional:      General: He is not in acute distress. Pulmonary:     Effort: Pulmonary effort is normal. No respiratory distress.  Skin:    General: Skin is warm and dry.     Comments: Right femoral puncture site soft without active bleeding or hematoma.  Neurological:     Mental Status: He is alert.     Comments: Alert, awake, and oriented x3. Speech and comprehension intact. PERRL bilaterally. EOMs intact bilaterally without nystagmus or subjective diplopia. Left facial droop. minimal movements of left side (left hand grip intact, LLE withdraws from pain). Distal pulses (DPs) 2+ bilaterally.     Imaging: IR CT Head [facility]  Result Date: 04/20/2020 INDICATION: 60 year old male with history acute right MCA syndrome, presents for attempt at mechanical thrombectomy and treatment. EXAM: ULTRASOUND-GUIDED ACCESS RIGHT COMMON FEMORAL ARTERY RIGHT-SIDED CERVICAL AND CEREBRAL ANGIOGRAM LEFT-SIDED CERVICAL ANGIOGRAM  BALLOON ANGIOPLASTY OCCLUDED RIGHT CERVICAL ICA MECHANICAL THROMBECTOMY RIGHT MCA M2 DIVISION ANGIO-SEAL FOR HEMOSTASIS COMPARISON:  CT imaging same day MEDICATIONS: 300 mg rectal aspirin ANESTHESIA/SEDATION: The anesthesia team was present to provide general endotracheal tube anesthesia and for patient monitoring during the procedure. Intubation was performed in negative pressure Bay in neuro IR holding. Left radial arterial line was performed by the anesthesia team. Interventional neuro radiology nursing staff was also present. CONTRAST:  100 cc IV contrast FLUOROSCOPY TIME:  Fluoroscopy Time: 18 minutes 12 seconds (889 mGy). COMPLICATIONS: SIR LEVEL B - Normal therapy, includes overnight admission for observation. Right-sided sylvian subarachnoid hemorrhage TECHNIQUE: Informed written consent was obtained from the patient's family after a thorough discussion of the procedural risks, benefits and alternatives. Specific risks discussed include: Bleeding, infection, contrast reaction, kidney injury/failure, need for further procedure/surgery, arterial injury or dissection, embolization to new territory, intracranial hemorrhage (10-15% risk), neurologic deterioration, cardiopulmonary collapse, death. All questions were addressed. Maximal Sterile Barrier Technique was utilized including during the procedure including caps, mask, sterile gowns, sterile gloves, sterile drape, hand hygiene and skin antiseptic. A timeout was performed prior to the initiation of the procedure. The anesthesia team was present to provide general endotracheal tube anesthesia and for patient monitoring during the procedure. Interventional neuro radiology nursing staff was also present. FINDINGS: Initial Findings: Right common carotid artery:  Normal course caliber and contour. Right external carotid artery: Patent with antegrade flow. Right internal carotid artery: Right ICA occluded at the cervical ICA. Calcified plaque present at the bulb.  Right MCA: After initial crossing and angioplasty of the cervical ICA, repeat angiogram was performed at the skull  base. This demonstrated a transient M1 occlusion, which then again migrated distally into the inferior division. Initial perfusion of the targeted vessel is TICI 0: No perfusion or antegrade flow beyond site of occlusion Right ACA:  A 1 segment patent. Completion Findings: Right MCA: Patent. The targeted artery, inferior division, dominant artery to the parietal region, final score of TICI 3: Complete perfusion of the territory Flat panel CT demonstrates small volume subarachnoid hemorrhage in the right sylvian sulci. Final angiogram of the cervical ICA after balloon angioplasty demonstrates persisting stenosis, measured 42% by NASCET CT criteria. TICI 3 Flat panel CT Findings: Left common carotid artery:  Normal course caliber and contour. Left external carotid artery: Patent with antegrade flow. Left internal carotid artery: Irregular string sign at the proximal left cervical ICA, with high-grade stenosis. NASCET criteria measurement is 79% stenosis PROCEDURE: The anesthesia team was present to provide general endotracheal tube anesthesia and for patient monitoring during the procedure. Intubation was performed in negative pressure Bay in neuro IR holding. Interventional neuro radiology nursing staff was also present. Ultrasound survey of the right inguinal region was performed with images stored and sent to PACs. 11 blade scalpel was used to make a small incision. Blunt dissection was performed with US guidance. A micropuncture needle was used access the right common femoral artery under ultrasound. With excellent arterial blood flow returned, an .018 micro wire was passed through the needle, observed to enter the abdominal aorta under fluoroscopy. The needle was removed, and a micropuncture sheath was placed over the wire. The inner dilator and wire were removed, and an 035 wire was advanced under  fluoroscopy into the abdominal aorta. The sheath was removed and a 25cm 63F straight vascular sheath was placed. The dilator was removed and the sheath was flushed. Sheath was attached to pressurized and heparinized saline bag for constant forward flow. 125 cm parents stent diagnostic catheter was used to select the innominate artery. The catheter was advanced into the common carotid artery using a roadrunner diagnostic wire. Wire was removed and angiogram was performed in the distal common carotid artery. The catheter was then directed towards the external carotid artery and a rose in wire was placed. A 95cm 087 "Walrus" balloon guide was then advanced on the Rose an wire. Once the balloon guide was position in the distal cervical ICA, rose in wire was removed. Baseline angiogram was again performed with roadmap achieved. A rapid transit microcatheter with a coaxial synchro soft wire was used to probe and cross the proximal cervical ICA occlusion. The wire was advanced distally with the rapid transit catheter, to the distal cervical ICA. Wire was removed and initial angiogram was performed. This proved a luminal position. A exchange length, 300 cm transcend wire with a floppy tip was then placed and the rapid transit was removed. Standard balloon angioplasty with a 4 mm by 40 mm Viatrac was performed at the carotid bulb. The balloon was inflated to 10.2 atmosphere. The balloon was deflated and the balloon guide was then advanced on the wire and the balloon shaft. A distal position of the cervical ICA was achieved. The balloon and the transcend wire were removed. Adequate flush was achieved at the balloon guide and then a new diagnostic angiogram was performed for road mapping of the intracranial occlusion. We identified the target as inferior division, M2, with a dominant perfusion to the parietal lobe. Copious back flush was performed and the balloon catheter was attached to heparinized and pressurized saline bag  for  forward flow. A second coaxial system was then advanced through the balloon catheter, which included the selected intermediate catheter, microcatheter, and microwire. In this scenario, the set up included a zoom 55 intermediate catheter, a Trevo Provue18 microcatheter, and 014 synchro soft wire. This system was advanced through the balloon guide catheter under the road-map function, with adequate back-flush at the rotating hemostatic valve at that back end of the balloon guide. Microcatheter and the intermediate catheter system were advanced through the terminal ICA and MCA to the level of the inferior division M2 occlusion. The micro wire was then carefully advanced through the occluded segment. Microcatheter was then manipulated through the occluded segment and the wire was removed with saline drip at the hub. Blood was then aspirated through the hub of the microcatheter, and a gentle contrast injection was performed confirming intraluminal position. A rotating hemostatic valve was then attached to the back end of the microcatheter, and a pressurized and heparinized saline bag was attached to the catheter. 3 x 20 solitaire device was then selected. Back flush was achieved at the rotating hemostatic valve, and then the device was gently advanced through the microcatheter to the distal end. The retriever was then unsheathed by withdrawing the microcatheter under fluoroscopy. Once the retriever was completely unsheathed, the microcatheter was carefully stripped from the delivery device. Control angiogram was performed from the intermediate catheter. A 3 minute time interval was observed. The balloon at the balloon guide catheter was then inflated under fluoroscopy for proximal flow arrest. Constant aspiration using the proprietary engine was then performed at the intermediate catheter, as the retriever was gently and slowly withdrawn with fluoroscopic observation. Once the retriever was "corked" within the tip of  the intermediate catheter, both were removed from the system. Free aspiration was confirmed at the hub of the balloon guide catheter, with free blood return confirmed. The balloon was then deflated, and a control angiogram was performed. Persisting occlusion of the inferior division was observed. We elected to perform a second pass. Second pass: The same coaxial system was advanced using a zoom 55 intermediate catheter, a Trevo Provue18 microcatheter, and 014 synchro soft wire. This system was advanced through the balloon guide catheter under the road-map function, with adequate back-flush at the rotating hemostatic valve at that back end of the balloon guide. Microcatheter and the intermediate catheter system were advanced through the terminal ICA and MCA to the level of the inferior division M2 occlusion. The micro wire and catheter were then removed. A direct aspiration strategy was then used with the zoom 55 catheter. The proprietary engine was turned on and the catheter was advanced slowly until there was no longer aspiration of blood products. Approximately 20 seconds-30 seconds was observed and the catheter was removed. Adequate aspiration was then performed at the hub of the balloon guide and another angiogram was performed. Restoration of flow was confirmed. Transcend wire was then advanced through the balloon guide and the balloon guide was withdrawn to the common carotid artery. Angiogram of the cervical ICA was performed. Flat panel CT was performed. After the imaging was performed of the head and the angiogram of the cervical ICA was reviewed, results were discussed with the neurology team. At this point, given the estimated 40% narrowing of the cervical ICA and the subarachnoid hemorrhage, we elected to terminate the case for observation, and surveillance of the carotid disease. The balloon guide and the wire were removed. A left-sided cervical angiogram was then performed using a JB 1 catheter.  Catheter was removed. The skin at the puncture site was then cleaned with Chlorhexidine. The 8 French sheath was removed and an 64F angioseal was deployed. Patient was extubated. Patient tolerated the procedure well and remained hemodynamically stable throughout. No complications were encountered and no significant blood loss encountered. IMPRESSION: Status post ultrasound guided access right common femoral artery for bilateral cervical/cerebral angiogram, and treatment of right-sided cervical ICA/MCA tandem occlusion, treating the cervical ICA occlusion with 4 mm balloon angioplasty, and restoring TICI 3 perfusion to the inferior division M2 branch with 2 passes of combination stent retriever and direct aspiration. Angio-Seal for closure. Signed, Dulcy Fanny. Dellia Nims, [facility] Radiology PLAN: Patient extubated. ICU status Target systolic blood pressure of 120-140 Right hip straight time 6 hours Frequent neurovascular checks Repeat neurologic imaging with CT and/MRI at the discretion of neurology team Continue aspirin antiplatelets Carotid duplex for carotid surveillance Electronically Signed   By: Corrie Mckusick D.O.   On: 04/20/2020 05:29   IR US Guide Vasc Access Right  Result Date: 04/20/2020 INDICATION: 60 year old male with history acute right MCA syndrome, presents for attempt at mechanical thrombectomy and treatment. EXAM: ULTRASOUND-GUIDED ACCESS RIGHT COMMON FEMORAL ARTERY RIGHT-SIDED CERVICAL AND CEREBRAL ANGIOGRAM LEFT-SIDED CERVICAL ANGIOGRAM BALLOON ANGIOPLASTY OCCLUDED RIGHT CERVICAL ICA MECHANICAL THROMBECTOMY RIGHT MCA M2 DIVISION ANGIO-SEAL FOR HEMOSTASIS COMPARISON:  CT imaging same day MEDICATIONS: 300 mg rectal aspirin ANESTHESIA/SEDATION: The anesthesia team was present to provide general endotracheal tube anesthesia and for patient monitoring during the procedure. Intubation was performed in negative pressure Bay in neuro IR holding.  Left radial arterial line was performed by the anesthesia team. Interventional neuro radiology nursing staff was also present. CONTRAST:  100 cc IV contrast FLUOROSCOPY TIME:  Fluoroscopy Time: 18 minutes 12 seconds (889 mGy). COMPLICATIONS: SIR LEVEL B - Normal therapy, includes overnight admission for observation. Right-sided sylvian subarachnoid hemorrhage TECHNIQUE: Informed written consent was obtained from the patient's family after a thorough discussion of the procedural risks, benefits and alternatives. Specific risks discussed include: Bleeding, infection, contrast reaction, kidney injury/failure, need for further procedure/surgery, arterial injury or dissection, embolization to new territory, intracranial hemorrhage (10-15% risk), neurologic deterioration, cardiopulmonary collapse, death. All questions were addressed. Maximal Sterile Barrier Technique was utilized including during the procedure including caps, mask, sterile gowns, sterile gloves, sterile drape, hand hygiene and skin antiseptic. A timeout was performed prior to the initiation of the procedure. The anesthesia team was present to provide general endotracheal tube anesthesia and for patient monitoring during the procedure. Interventional neuro radiology nursing staff was also present. FINDINGS: Initial Findings: Right common carotid artery:  Normal course caliber and contour. Right external carotid artery: Patent with antegrade flow. Right internal carotid artery: Right ICA occluded at the cervical ICA. Calcified plaque present at the bulb. Right MCA: After initial crossing and angioplasty of the cervical ICA, repeat angiogram was performed at the skull base. This demonstrated a transient M1 occlusion, which then again migrated distally into the inferior division. Initial perfusion of the targeted vessel is TICI 0: No perfusion or antegrade flow beyond site of occlusion Right ACA:  A 1 segment patent. Completion Findings: Right MCA: Patent.  The targeted artery, inferior division, dominant artery to the parietal region, final score of TICI 3: Complete perfusion of the territory Flat panel CT demonstrates small volume subarachnoid hemorrhage in the right sylvian sulci. Final angiogram of the cervical ICA after balloon angioplasty demonstrates persisting stenosis, measured 42% by NASCET CT criteria. TICI 3 Flat panel  CT Findings: Left common carotid artery:  Normal course caliber and contour. Left external carotid artery: Patent with antegrade flow. Left internal carotid artery: Irregular string sign at the proximal left cervical ICA, with high-grade stenosis. NASCET criteria measurement is 79% stenosis PROCEDURE: The anesthesia team was present to provide general endotracheal tube anesthesia and for patient monitoring during the procedure. Intubation was performed in negative pressure Bay in neuro IR holding. Interventional neuro radiology nursing staff was also present. Ultrasound survey of the right inguinal region was performed with images stored and sent to PACs. 11 blade scalpel was used to make a small incision. Blunt dissection was performed with US guidance. A micropuncture needle was used access the right common femoral artery under ultrasound. With excellent arterial blood flow returned, an .018 micro wire was passed through the needle, observed to enter the abdominal aorta under fluoroscopy. The needle was removed, and a micropuncture sheath was placed over the wire. The inner dilator and wire were removed, and an 035 wire was advanced under fluoroscopy into the abdominal aorta. The sheath was removed and a 25cm 69F straight vascular sheath was placed. The dilator was removed and the sheath was flushed. Sheath was attached to pressurized and heparinized saline bag for constant forward flow. 125 cm parents stent diagnostic catheter was used to select the innominate artery. The catheter was advanced into the common carotid artery using a  roadrunner diagnostic wire. Wire was removed and angiogram was performed in the distal common carotid artery. The catheter was then directed towards the external carotid artery and a rose in wire was placed. A 95cm 087 "Walrus" balloon guide was then advanced on the Rose an wire. Once the balloon guide was position in the distal cervical ICA, rose in wire was removed. Baseline angiogram was again performed with roadmap achieved. A rapid transit microcatheter with a coaxial synchro soft wire was used to probe and cross the proximal cervical ICA occlusion. The wire was advanced distally with the rapid transit catheter, to the distal cervical ICA. Wire was removed and initial angiogram was performed. This proved a luminal position. A exchange length, 300 cm transcend wire with a floppy tip was then placed and the rapid transit was removed. Standard balloon angioplasty with a 4 mm by 40 mm Viatrac was performed at the carotid bulb. The balloon was inflated to 10.2 atmosphere. The balloon was deflated and the balloon guide was then advanced on the wire and the balloon shaft. A distal position of the cervical ICA was achieved. The balloon and the transcend wire were removed. Adequate flush was achieved at the balloon guide and then a new diagnostic angiogram was performed for road mapping of the intracranial occlusion. We identified the target as inferior division, M2, with a dominant perfusion to the parietal lobe. Copious back flush was performed and the balloon catheter was attached to heparinized and pressurized saline bag for forward flow. A second coaxial system was then advanced through the balloon catheter, which included the selected intermediate catheter, microcatheter, and microwire. In this scenario, the set up included a zoom 55 intermediate catheter, a Trevo Provue18 microcatheter, and 014 synchro soft wire. This system was advanced through the balloon guide catheter under the road-map function, with  adequate back-flush at the rotating hemostatic valve at that back end of the balloon guide. Microcatheter and the intermediate catheter system were advanced through the terminal ICA and MCA to the level of the inferior division M2 occlusion. The micro wire was then carefully advanced  through the occluded segment. Microcatheter was then manipulated through the occluded segment and the wire was removed with saline drip at the hub. Blood was then aspirated through the hub of the microcatheter, and a gentle contrast injection was performed confirming intraluminal position. A rotating hemostatic valve was then attached to the back end of the microcatheter, and a pressurized and heparinized saline bag was attached to the catheter. 3 x 20 solitaire device was then selected. Back flush was achieved at the rotating hemostatic valve, and then the device was gently advanced through the microcatheter to the distal end. The retriever was then unsheathed by withdrawing the microcatheter under fluoroscopy. Once the retriever was completely unsheathed, the microcatheter was carefully stripped from the delivery device. Control angiogram was performed from the intermediate catheter. A 3 minute time interval was observed. The balloon at the balloon guide catheter was then inflated under fluoroscopy for proximal flow arrest. Constant aspiration using the proprietary engine was then performed at the intermediate catheter, as the retriever was gently and slowly withdrawn with fluoroscopic observation. Once the retriever was "corked" within the tip of the intermediate catheter, both were removed from the system. Free aspiration was confirmed at the hub of the balloon guide catheter, with free blood return confirmed. The balloon was then deflated, and a control angiogram was performed. Persisting occlusion of the inferior division was observed. We elected to perform a second pass. Second pass: The same coaxial system was advanced using a  zoom 55 intermediate catheter, a Trevo Provue18 microcatheter, and 014 synchro soft wire. This system was advanced through the balloon guide catheter under the road-map function, with adequate back-flush at the rotating hemostatic valve at that back end of the balloon guide. Microcatheter and the intermediate catheter system were advanced through the terminal ICA and MCA to the level of the inferior division M2 occlusion. The micro wire and catheter were then removed. A direct aspiration strategy was then used with the zoom 55 catheter. The proprietary engine was turned on and the catheter was advanced slowly until there was no longer aspiration of blood products. Approximately 20 seconds-30 seconds was observed and the catheter was removed. Adequate aspiration was then performed at the hub of the balloon guide and another angiogram was performed. Restoration of flow was confirmed. Transcend wire was then advanced through the balloon guide and the balloon guide was withdrawn to the common carotid artery. Angiogram of the cervical ICA was performed. Flat panel CT was performed. After the imaging was performed of the head and the angiogram of the cervical ICA was reviewed, results were discussed with the neurology team. At this point, given the estimated 40% narrowing of the cervical ICA and the subarachnoid hemorrhage, we elected to terminate the case for observation, and surveillance of the carotid disease. The balloon guide and the wire were removed. A left-sided cervical angiogram was then performed using a JB 1 catheter. Catheter was removed. The skin at the puncture site was then cleaned with Chlorhexidine. The 8 French sheath was removed and an 63F angioseal was deployed. Patient was extubated. Patient tolerated the procedure well and remained hemodynamically stable throughout. No complications were encountered and no significant blood loss encountered. IMPRESSION: Status post ultrasound guided access right  common femoral artery for bilateral cervical/cerebral angiogram, and treatment of right-sided cervical ICA/MCA tandem occlusion, treating the cervical ICA occlusion with 4 mm balloon angioplasty, and restoring TICI 3 perfusion to the inferior division M2 branch with 2 passes of combination stent retriever and direct  aspiration. Angio-Seal for closure. Signed, Dulcy Fanny. Dellia Nims, [facility] Radiology PLAN: Patient extubated. ICU status Target systolic blood pressure of 120-140 Right hip straight time 6 hours Frequent neurovascular checks Repeat neurologic imaging with CT and/MRI at the discretion of neurology team Continue aspirin antiplatelets Carotid duplex for carotid surveillance Electronically Signed   By: Corrie Mckusick D.O.   On: 04/20/2020 05:29   CT CEREBRAL PERFUSION W CONTRAST  Result Date: 04/20/2020 CLINICAL DATA:  Initial evaluation for acute stroke, left-sided weakness. EXAM: CT ANGIOGRAPHY HEAD AND NECK CT PERFUSION BRAIN TECHNIQUE: Multidetector CT imaging of the head and neck was performed using the standard protocol during bolus administration of intravenous contrast. Multiplanar CT image reconstructions and MIPs were obtained to evaluate the vascular anatomy. Carotid stenosis measurements (when applicable) are obtained utilizing NASCET criteria, using the distal internal carotid diameter as the denominator. Multiphase CT imaging of the brain was performed following IV bolus contrast injection. Subsequent parametric perfusion maps were calculated using RAPID software. CONTRAST:  147m OMNIPAQUE IOHEXOL 350 MG/ML SOLN COMPARISON:  None. FINDINGS: CTA NECK FINDINGS Aortic arch: Visualized aortic arch normal in caliber with normal branch pattern. No hemodynamically significant stenosis about the origin of the great vessels. Visualized subclavian arteries widely patent. Right carotid system: Right CCA patent from its origin to the bifurcation  without stenosis. There is abrupt occlusion of the proximal right ICA just beyond the right bifurcation. Right ICA remains occluded within the neck. Left carotid system: Left CCA patent from its origin to the bifurcation without stenosis. There is severe near occlusive stenosis of the proximal left ICA just beyond the left bifurcation. A radiographic string sign is present. Stenosis begins just distal to the bifurcation, and measures approximately 2.1 cm in length (series 510, image 399). Left ICA otherwise patent distally to the skull base without stenosis, dissection, or occlusion. Vertebral arteries: Both vertebral arteries arise from the subclavian arteries. Vertebral arteries patent within the neck without stenosis, dissection or occlusion. Skeleton: Exaggeration of the normal thoracic kyphosis. No visible acute osseous finding. No discrete or worrisome osseous lesions. Other neck: No other acute soft tissue abnormality within the neck. No mass or adenopathy. Poor dentition noted. Upper chest: Visualized upper chest demonstrates no acute finding. Review of the MIP images confirms the above findings CTA HEAD FINDINGS Anterior circulation: Right ICA remains occluded at the skull base. Distal reconstitution at the cavernous segment via collateral flow across the circle-of-Willis. Right M1 segment perfused to the right MCA bifurcation. There is downstream occlusion of a distal right M2/M3 branch, inferior division (series 510, image 181). Some attenuated flow seen distally, which could be related to subocclusive thrombus and/or collateralization. Remainder of the right MCA branches perfused. Petrous left ICA widely patent. Atheromatous change within the cavernous left ICA without significant stenosis. A1 segments patent bilaterally. Normal anterior communicating artery complex. Anterior cerebral arteries patent to their distal aspects without stenosis. Left M1 widely patent. Normal left MCA bifurcation. Distal left  MCA branches well perfused. Posterior circulation: Both V4 segments patent to the vertebrobasilar junction without stenosis. Left vertebral slightly dominant. Both PICA origins patent and normal. Basilar patent to its distal aspect without stenosis. Superior cerebellar arteries patent bilaterally. Both PCA supplied via the basilar as well as small bilateral posterior communicating arteries. PCAs well perfused to their distal aspects. Venous sinuses: Grossly patent allowing for timing the contrast bolus. Anatomic variants: None significant.  No intracranial aneurysm. Review of the MIP images confirms the above  findings CT Brain Perfusion Findings: ASPECTS: 10. CBF (<30%) Volume: 67m Perfusion (Tmax>6.0s) volume: 864mMismatch Volume: 6724mnfarction Location:Acute core infarct involving the posterior right MCA distribution at the right frontoparietal region. 67 cc surrounding penumbra. IMPRESSION: CTA HEAD AND NECK IMPRESSION: 1. Positive CTA for with abrupt occlusion of the proximal right ICA just beyond the right bifurcation. Distal reconstitution at the cavernous segment via collateral flow across the circle-of-Willis. Subsequent downstream occlusion at a distal right M2/M3 branch, inferior division. 2. 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 3. Additional mild atheromatous change elsewhere about the major arterial vasculature of the head and neck. No other high-grade or correctable stenosis. 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 Critical Value/emergent results were called by telephone at the time of interpretation on 04/20/2020 at 12:30 am to [facility] verbally [facility] these results. Electronically Signed   By: BenJeannine BogaD.   On: 04/20/2020 01:24   IR PERCUTANEOUS ART THROMBECTOMY/INFUSION INTRACRANIAL INC DIAG  ANGIO  Result Date: 04/20/2020 INDICATION: [age]ar old male with history acute right MCA syndrome, presents for attempt at mechanical thrombectomy and treatment. EXAM: ULTRASOUND-GUIDED ACCESS RIGHT COMMON FEMORAL ARTERY RIGHT-SIDED CERVICAL AND CEREBRAL ANGIOGRAM LEFT-SIDED CERVICAL ANGIOGRAM BALLOON ANGIOPLASTY OCCLUDED RIGHT CERVICAL ICA MECHANICAL THROMBECTOMY RIGHT MCA M2 DIVISION ANGIO-SEAL FOR HEMOSTASIS COMPARISON:  CT imaging same day MEDICATIONS: 300 mg rectal aspirin ANESTHESIA/SEDATION: The anesthesia team was present to provide general endotracheal tube anesthesia and for patient monitoring during the procedure. Intubation was performed in negative pressure [place] in neuro IR holding. Left radial arterial line was performed by the anesthesia team. Interventional neuro radiology nursing staff was also present. CONTRAST:  100 cc IV contrast FLUOROSCOPY TIME:  Fluoroscopy Time: 18 minutes 12 seconds (889 mGy). COMPLICATIONS: SIR LEVEL B - Normal therapy, includes overnight admission for observation. Right-sided sylvian subarachnoid hemorrhage TECHNIQUE: Informed written consent was obtained from the patient's family after a thorough discussion of the procedural risks, benefits and alternatives. Specific risks discussed include: Bleeding, infection, contrast reaction, kidney injury/failure, need for further procedure/surgery, arterial injury or dissection, embolization to new territory, intracranial hemorrhage (10-15% risk), neurologic deterioration, cardiopulmonary collapse, death. All questions were addressed. Maximal Sterile Barrier Technique was utilized including during the procedure including caps, mask, sterile gowns, sterile gloves, sterile drape, hand hygiene and skin antiseptic. A timeout was performed prior to the initiation of the procedure. The anesthesia team was present to provide general endotracheal tube anesthesia and for patient monitoring during the procedure. Interventional neuro  radiology nursing staff was also present. FINDINGS: Initial Findings: Right common carotid artery:  Normal course caliber and contour. Right external carotid artery: Patent with antegrade flow. Right internal carotid artery: Right ICA occluded at the cervical ICA. Calcified plaque present at the bulb. Right MCA: After initial crossing and angioplasty of the cervical ICA, repeat angiogram was performed at the skull base. This demonstrated a transient M1 occlusion, which then again migrated distally into the inferior division. Initial perfusion of the targeted vessel is TICI 0: No perfusion or antegrade flow beyond site of occlusion Right ACA:  A 1 segment patent. Completion Findings: Right MCA: Patent. The targeted artery, inferior division, dominant artery to the parietal region, final score of TICI 3: Complete perfusion of the territory Flat panel CT demonstrates small volume subarachnoid hemorrhage in the right sylvian sulci. Final angiogram of the cervical ICA after balloon angioplasty demonstrates persisting  stenosis, measured 42% by NASCET CT criteria. TICI 3 Flat panel CT Findings: Left common carotid artery:  Normal course caliber and contour. Left external carotid artery: Patent with antegrade flow. Left internal carotid artery: Irregular string sign at the proximal left cervical ICA, with high-grade stenosis. NASCET criteria measurement is 79% stenosis PROCEDURE: The anesthesia team was present to provide general endotracheal tube anesthesia and for patient monitoring during the procedure. Intubation was performed in negative pressure [place] in neuro IR holding. Interventional neuro radiology nursing staff was also present. Ultrasound survey of the right inguinal region was performed with images stored and sent to PACs. 11 blade scalpel was used to make a small incision. Blunt dissection was performed with US guidance. A micropuncture needle was used access the right common femoral artery under ultrasound.  With excellent arterial blood flow returned, an .018 micro wire was passed through the needle, observed to enter the abdominal aorta under fluoroscopy. The needle was removed, and a micropuncture sheath was placed over the wire. The inner dilator and wire were removed, and an 035 wire was advanced under fluoroscopy into the abdominal aorta. The sheath was removed and a 25cm 43F straight vascular sheath was placed. The dilator was removed and the sheath was flushed. Sheath was attached to pressurized and heparinized saline bag for constant forward flow. 125 cm parents stent diagnostic catheter was used to select the innominate artery. The catheter was advanced into the common carotid artery using a roadrunner diagnostic wire. Wire was removed and angiogram was performed in the distal common carotid artery. The catheter was then directed towards the external carotid artery and a rose in wire was placed. A 95cm 087 "Walrus" balloon guide was then advanced on the Rose an wire. Once the balloon guide was position in the distal cervical ICA, rose in wire was removed. Baseline angiogram was again performed with roadmap achieved. A rapid transit microcatheter with a coaxial synchro soft wire was used to probe and cross the proximal cervical ICA occlusion. The wire was advanced distally with the rapid transit catheter, to the distal cervical ICA. Wire was removed and initial angiogram was performed. This proved a luminal position. A exchange length, 300 cm transcend wire with a floppy tip was then placed and the rapid transit was removed. Standard balloon angioplasty with a 4 mm by 40 mm Viatrac was performed at the carotid bulb. The balloon was inflated to 10.2 atmosphere. The balloon was deflated and the balloon guide was then advanced on the wire and the balloon shaft. A distal position of the cervical ICA was achieved. The balloon and the transcend wire were removed. Adequate flush was achieved at the balloon guide and  then a new diagnostic angiogram was performed for road mapping of the intracranial occlusion. We identified the target as inferior division, M2, with a dominant perfusion to the parietal lobe. Copious back flush was performed and the balloon catheter was attached to heparinized and pressurized saline bag for forward flow. A second coaxial system was then advanced through the balloon catheter, which included the selected intermediate catheter, microcatheter, and microwire. In this scenario, the set up included a zoom 55 intermediate catheter, a Trevo Provue18 microcatheter, and 014 synchro soft wire. This system was advanced through the balloon guide catheter under the road-map function, with adequate back-flush at the rotating hemostatic valve at that back end of the balloon guide. Microcatheter and the intermediate catheter system were advanced through the terminal ICA and MCA to the level of the  inferior division M2 occlusion. The micro wire was then carefully advanced through the occluded segment. Microcatheter was then manipulated through the occluded segment and the wire was removed with saline drip at the hub. Blood was then aspirated through the hub of the microcatheter, and a gentle contrast injection was performed confirming intraluminal position. A rotating hemostatic valve was then attached to the back end of the microcatheter, and a pressurized and heparinized saline bag was attached to the catheter. 3 x 20 solitaire device was then selected. Back flush was achieved at the rotating hemostatic valve, and then the device was gently advanced through the microcatheter to the distal end. The retriever was then unsheathed by withdrawing the microcatheter under fluoroscopy. Once the retriever was completely unsheathed, the microcatheter was carefully stripped from the delivery device. Control angiogram was performed from the intermediate catheter. A 3 minute time interval was observed. The balloon at the  balloon guide catheter was then inflated under fluoroscopy for proximal flow arrest. Constant aspiration using the proprietary engine was then performed at the intermediate catheter, as the retriever was gently and slowly withdrawn with fluoroscopic observation. Once the retriever was "corked" within the tip of the intermediate catheter, both were removed from the system. Free aspiration was confirmed at the hub of the balloon guide catheter, with free blood return confirmed. The balloon was then deflated, and a control angiogram was performed. Persisting occlusion of the inferior division was observed. We elected to perform a second pass. Second pass: The same coaxial system was advanced using a zoom 55 intermediate catheter, a Trevo Provue18 microcatheter, and 014 synchro soft wire. This system was advanced through the balloon guide catheter under the road-map function, with adequate back-flush at the rotating hemostatic valve at that back end of the balloon guide. Microcatheter and the intermediate catheter system were advanced through the terminal ICA and MCA to the level of the inferior division M2 occlusion. The micro wire and catheter were then removed. A direct aspiration strategy was then used with the zoom 55 catheter. The proprietary engine was turned on and the catheter was advanced slowly until there was no longer aspiration of blood products. Approximately 20 seconds-30 seconds was observed and the catheter was removed. Adequate aspiration was then performed at the hub of the balloon guide and another angiogram was performed. Restoration of flow was confirmed. Transcend wire was then advanced through the balloon guide and the balloon guide was withdrawn to the common carotid artery. Angiogram of the cervical ICA was performed. Flat panel CT was performed. After the imaging was performed of the head and the angiogram of the cervical ICA was reviewed, results were discussed with the neurology team. At  this point, given the estimated 40% narrowing of the cervical ICA and the subarachnoid hemorrhage, we elected to terminate the case for observation, and surveillance of the carotid disease. The balloon guide and the wire were removed. A left-sided cervical angiogram was then performed using a JB 1 catheter. Catheter was removed. The skin at the puncture site was then cleaned with Chlorhexidine. The 8 French sheath was removed and an 100F angioseal was deployed. Patient was extubated. Patient tolerated the procedure well and remained hemodynamically stable throughout. No complications were encountered and no significant blood loss encountered. IMPRESSION: Status post ultrasound guided access right common femoral artery for bilateral cervical/cerebral angiogram, and treatment of right-sided cervical ICA/MCA tandem occlusion, treating the cervical ICA occlusion with 4 mm balloon angioplasty, and restoring TICI 3 perfusion to the inferior division  M2 branch with 2 passes of combination stent retriever and direct aspiration. Angio-Seal for closure. Signed, Dulcy Fanny. Dellia Nims, [facility] PLAN: Patient extubated. ICU status Target systolic blood pressure of 120-140 Right hip straight time 6 hours Frequent neurovascular checks Repeat neurologic imaging with CT and/MRI at the discretion of neurology team Continue aspirin antiplatelets Carotid duplex for carotid surveillance Electronically Signed   By: Corrie Mckusick D.O.   On: 04/20/2020 05:29   CT HEAD CODE STROKE WO CONTRAST  Result Date: 04/20/2020 CLINICAL DATA:  Code stroke.  Initial evaluation for acute stroke. EXAM: CT HEAD WITHOUT CONTRAST TECHNIQUE: Contiguous axial images were obtained from the base of the skull through the vertex without intravenous contrast. COMPARISON:  None. FINDINGS: Brain: Cerebral volume within normal limits. Few small remote lacunar infarcts present at the at the left basal  ganglia. Associated wallerian degeneration on the right. No acute intracranial hemorrhage. No convincing acute or evolving large vessel territory ischemia. No mass lesion, midline shift or mass effect. No hydrocephalus or extra-axial fluid collection. Vascular: Asymmetric hyperdensity at the level of distal right M2/M3 branches at the right sylvian fissure, concerning for thrombus (series 3, image 16). Skull: Scalp soft tissues and calvarium within normal limits. Sinuses/Orbits: Right gaze noted. Sequelae of prior ORIF noted at the left face. Mild chronic mucosal thickening noted within the ethmoidal air cells and maxillary sinuses. No mastoid effusion. Other: None. ASPECTS [facility] Stroke Program Early CT Score) - Ganglionic level infarction (caudate, lentiform nuclei, internal capsule, insula, M1-M3 cortex): 7 - Supraganglionic infarction (M4-M6 cortex): 3 Total score (0-10 with 10 being normal): 10 IMPRESSION: 1. Asymmetric hyperdensity involving right M2/M3 branches, concerning for thrombus. Further evaluation with dedicated CTA recommended. No intracranial hemorrhage. 2. ASPECTS is 10. 3. Few remote lacunar infarcts about the bilateral basal ganglia. Critical Value/emergent results were called by telephone at the time of interpretation on 04/20/2020 at 12:30 am to provider Dr. Lorrin Goodell, who verbally acknowledged these results. Electronically Signed   By: Jeannine Boga M.D.   On: 04/20/2020 00:58   CT ANGIO HEAD CODE STROKE  Result Date: 04/20/2020 CLINICAL DATA:  Initial evaluation for acute stroke, left-sided weakness. EXAM: CT ANGIOGRAPHY HEAD AND NECK CT PERFUSION BRAIN TECHNIQUE: Multidetector CT imaging of the head and neck was performed using the standard protocol during bolus administration of intravenous contrast. Multiplanar CT image reconstructions and MIPs were obtained to evaluate the vascular anatomy. Carotid stenosis measurements (when applicable) are obtained utilizing NASCET  criteria, using the distal internal carotid diameter as the denominator. Multiphase CT imaging of the brain was performed following IV bolus contrast injection. Subsequent parametric perfusion maps were calculated using RAPID software. CONTRAST:  154m OMNIPAQUE IOHEXOL 350 MG/ML SOLN COMPARISON:  None. FINDINGS: CTA NECK FINDINGS Aortic arch: Visualized aortic arch normal in caliber with normal branch pattern. No hemodynamically significant stenosis about the origin of the great vessels. Visualized subclavian arteries widely patent. Right carotid system: Right CCA patent from its origin to the bifurcation without stenosis. There is abrupt occlusion of the proximal right ICA just beyond the right bifurcation. Right ICA remains occluded within the neck. Left carotid system: Left CCA patent from its origin to the bifurcation without stenosis. There is severe near occlusive stenosis of the proximal left ICA just beyond the left bifurcation. A radiographic string sign is present. Stenosis begins just distal to the bifurcation, and measures approximately 2.1 cm in length (series 510, image 399). Left ICA otherwise patent distally to  the skull base without stenosis, dissection, or occlusion. Vertebral arteries: Both vertebral arteries arise from the subclavian arteries. Vertebral arteries patent within the neck without stenosis, dissection or occlusion. Skeleton: Exaggeration of the normal thoracic kyphosis. No visible acute osseous finding. No discrete or worrisome osseous lesions. Other neck: No other acute soft tissue abnormality within the neck. No mass or adenopathy. Poor dentition noted. Upper chest: Visualized upper chest demonstrates no acute finding. Review of the MIP images confirms the above findings CTA HEAD FINDINGS Anterior circulation: Right ICA remains occluded at the skull base. Distal reconstitution at the cavernous segment via collateral flow across the circle-of-Willis. Right M1 segment perfused to the  right MCA bifurcation. There is downstream occlusion of a distal right M2/M3 branch, inferior division (series 510, image 181). Some attenuated flow seen distally, which could be related to subocclusive thrombus and/or collateralization. Remainder of the right MCA branches perfused. Petrous left ICA widely patent. Atheromatous change within the cavernous left ICA without significant stenosis. A1 segments patent bilaterally. Normal anterior communicating artery complex. Anterior cerebral arteries patent to their distal aspects without stenosis. Left M1 widely patent. Normal left MCA bifurcation. Distal left MCA branches well perfused. Posterior circulation: Both V4 segments patent to the vertebrobasilar junction without stenosis. Left vertebral slightly dominant. Both PICA origins patent and normal. Basilar patent to its distal aspect without stenosis. Superior cerebellar arteries patent bilaterally. Both PCA supplied via the basilar as well as small bilateral posterior communicating arteries. PCAs well perfused to their distal aspects. Venous sinuses: Grossly patent allowing for timing the contrast bolus. Anatomic variants: None significant.  No intracranial aneurysm. Review of the MIP images confirms the above findings CT Brain Perfusion Findings: ASPECTS: 10. CBF (<30%) Volume: 33m Perfusion (Tmax>6.0s) volume: 827mMismatch Volume: 6779mnfarction Location:Acute core infarct involving the posterior right MCA distribution at the right frontoparietal region. 67 cc surrounding penumbra. IMPRESSION: CTA HEAD AND NECK IMPRESSION: 1. Positive CTA for with abrupt occlusion of the proximal right ICA just beyond the right bifurcation. Distal reconstitution at the cavernous segment via collateral flow across the circle-of-Willis. Subsequent downstream occlusion at a distal right M2/M3 branch, inferior division. 2. 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 3.  Additional mild atheromatous change elsewhere about the major arterial vasculature of the head and neck. No other high-grade or correctable stenosis. 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 Critical Value/emergent results were called by telephone at the time of interpretation on 04/20/2020 at 12:30 am to provider [facility]who verbally acknowledged these results. Electronically Signed   By: BenJeannine BogaD.   On: 04/20/2020 01:24   CT ANGIO NECK CODE STROKE  Result Date: 04/20/2020 CLINICAL DATA:  Initial evaluation for acute stroke, left-sided weakness. EXAM: CT ANGIOGRAPHY HEAD AND NECK CT PERFUSION BRAIN TECHNIQUE: Multidetector CT imaging of the head and neck was performed using the standard protocol during bolus administration of intravenous contrast. Multiplanar CT image reconstructions and MIPs were obtained to evaluate the vascular anatomy. Carotid stenosis measurements (when applicable) are obtained utilizing NASCET criteria, using the distal internal carotid diameter as the denominator. Multiphase CT imaging of the brain was performed following IV bolus contrast injection. Subsequent parametric perfusion maps were calculated using RAPID software. CONTRAST:  100m58mNIPAQUE IOHEXOL 350 MG/ML SOLN COMPARISON:  None. FINDINGS: CTA NECK FINDINGS Aortic arch: Visualized aortic arch normal in caliber with normal branch pattern. No  hemodynamically significant stenosis about the origin of the great vessels. Visualized subclavian arteries widely patent. Right carotid system: Right CCA patent from its origin to the bifurcation without stenosis. There is abrupt occlusion of the proximal right ICA just beyond the right bifurcation. Right ICA remains occluded within the neck. Left carotid system: Left CCA patent from its origin to the bifurcation without stenosis. There is severe near occlusive  stenosis of the proximal left ICA just beyond the left bifurcation. A radiographic string sign is present. Stenosis begins just distal to the bifurcation, and measures approximately 2.1 cm in length (series 510, image 399). Left ICA otherwise patent distally to the skull base without stenosis, dissection, or occlusion. Vertebral arteries: Both vertebral arteries arise from the subclavian arteries. Vertebral arteries patent within the neck without stenosis, dissection or occlusion. Skeleton: Exaggeration of the normal thoracic kyphosis. No visible acute osseous finding. No discrete or worrisome osseous lesions. Other neck: No other acute soft tissue abnormality within the neck. No mass or adenopathy. Poor dentition noted. Upper chest: Visualized upper chest demonstrates no acute finding. Review of the MIP images confirms the above findings CTA HEAD FINDINGS Anterior circulation: Right ICA remains occluded at the skull base. Distal reconstitution at the cavernous segment via collateral flow across the circle-of-Willis. Right M1 segment perfused to the right MCA bifurcation. There is downstream occlusion of a distal right M2/M3 branch, inferior division (series 510, image 181). Some attenuated flow seen distally, which could be related to subocclusive thrombus and/or collateralization. Remainder of the right MCA branches perfused. Petrous left ICA widely patent. Atheromatous change within the cavernous left ICA without significant stenosis. A1 segments patent bilaterally. Normal anterior communicating artery complex. Anterior cerebral arteries patent to their distal aspects without stenosis. Left M1 widely patent. Normal left MCA bifurcation. Distal left MCA branches well perfused. Posterior circulation: Both V4 segments patent to the vertebrobasilar junction without stenosis. Left vertebral slightly dominant. Both PICA origins patent and normal. Basilar patent to its distal aspect without stenosis. Superior cerebellar  arteries patent bilaterally. Both PCA supplied via the basilar as well as small bilateral posterior communicating arteries. PCAs well perfused to their distal aspects. Venous sinuses: Grossly patent allowing for timing the contrast bolus. Anatomic variants: None significant.  No intracranial aneurysm. Review of the MIP images confirms the above findings CT Brain Perfusion Findings: ASPECTS: 10. CBF (<30%) Volume: 78m Perfusion (Tmax>6.0s) volume: 876mMismatch Volume: 6758mnfarction Location:Acute core infarct involving the posterior right MCA distribution at the right frontoparietal region. 67 cc surrounding penumbra. IMPRESSION: CTA HEAD AND NECK IMPRESSION: 1. Positive CTA for with abrupt occlusion of the proximal right ICA just beyond the right bifurcation. Distal reconstitution at the cavernous segment via collateral flow across the circle-of-Willis. Subsequent downstream occlusion at a distal right M2/M3 branch, inferior division. 2. 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 3. Additional mild atheromatous change elsewhere about the major arterial vasculature of the head and neck. No other high-grade or correctable stenosis. 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 Critical Value/emergent results were called by telephone at the time of interpretation on 04/20/2020 at 12:30 am to provider [facility] these results. Electronically Signed   By: BenJeannine BogaD.   On: 04/20/2020 01:24   IR ANGIO EXTRACRAN SEL COM CAROTID INNOMINATE UNI L MOD SED  Result Date: 04/20/2020 INDICATION: [age]ar old male with history acute  right MCA syndrome, presents for attempt at mechanical thrombectomy and treatment. EXAM: ULTRASOUND-GUIDED ACCESS RIGHT COMMON FEMORAL ARTERY RIGHT-SIDED CERVICAL AND CEREBRAL ANGIOGRAM LEFT-SIDED  CERVICAL ANGIOGRAM BALLOON ANGIOPLASTY OCCLUDED RIGHT CERVICAL ICA MECHANICAL THROMBECTOMY RIGHT MCA M2 DIVISION ANGIO-SEAL FOR HEMOSTASIS COMPARISON:  CT imaging same day MEDICATIONS: 300 mg rectal aspirin ANESTHESIA/SEDATION: The anesthesia team was present to provide general endotracheal tube anesthesia and for patient monitoring during the procedure. Intubation was performed in negative pressure [place] in neuro IR holding. Left radial arterial line was performed by the anesthesia team. Interventional neuro radiology nursing staff was also present. CONTRAST:  100 cc IV contrast FLUOROSCOPY TIME:  Fluoroscopy Time: 18 minutes 12 seconds (889 mGy). COMPLICATIONS: SIR LEVEL B - Normal therapy, includes overnight admission for observation. Right-sided sylvian subarachnoid hemorrhage TECHNIQUE: Informed written consent was obtained from the patient's family after a thorough discussion of the procedural risks, benefits and alternatives. Specific risks discussed include: Bleeding, infection, contrast reaction, kidney injury/failure, need for further procedure/surgery, arterial injury or dissection, embolization to new territory, intracranial hemorrhage (10-15% risk), neurologic deterioration, cardiopulmonary collapse, death. All questions were addressed. Maximal Sterile Barrier Technique was utilized including during the procedure including caps, mask, sterile gowns, sterile gloves, sterile drape, hand hygiene and skin antiseptic. A timeout was performed prior to the initiation of the procedure. The anesthesia team was present to provide general endotracheal tube anesthesia and for patient monitoring during the procedure. Interventional neuro radiology nursing staff was also present. FINDINGS: Initial Findings: Right common carotid artery:  Normal course caliber and contour. Right external carotid artery: Patent with antegrade flow. Right internal carotid artery: Right ICA occluded at the cervical ICA. Calcified plaque  present at the bulb. Right MCA: After initial crossing and angioplasty of the cervical ICA, repeat angiogram was performed at the skull base. This demonstrated a transient M1 occlusion, which then again migrated distally into the inferior division. Initial perfusion of the targeted vessel is TICI 0: No perfusion or antegrade flow beyond site of occlusion Right ACA:  A 1 segment patent. Completion Findings: Right MCA: Patent. The targeted artery, inferior division, dominant artery to the parietal region, final score of TICI 3: Complete perfusion of the territory Flat panel CT demonstrates small volume subarachnoid hemorrhage in the right sylvian sulci. Final angiogram of the cervical ICA after balloon angioplasty demonstrates persisting stenosis, measured 42% by NASCET CT criteria. TICI 3 Flat panel CT Findings: Left common carotid artery:  Normal course caliber and contour. Left external carotid artery: Patent with antegrade flow. Left internal carotid artery: Irregular string sign at the proximal left cervical ICA, with high-grade stenosis. NASCET criteria measurement is 79% stenosis PROCEDURE: The anesthesia team was present to provide general endotracheal tube anesthesia and for patient monitoring during the procedure. Intubation was performed in negative pressure [place] in neuro IR holding. Interventional neuro radiology nursing staff was also present. Ultrasound survey of the right inguinal region was performed with images stored and sent to PACs. 11 blade scalpel was used to make a small incision. Blunt dissection was performed with US guidance. A micropuncture needle was used access the right common femoral artery under ultrasound. With excellent arterial blood flow returned, an .018 micro wire was passed through the needle, observed to enter the abdominal aorta under fluoroscopy. The needle was removed, and a micropuncture sheath was placed over the wire. The inner dilator and wire were removed, and an 035 wire  was advanced under fluoroscopy into the abdominal aorta. The sheath was removed and a 25cm 68F  straight vascular sheath was placed. The dilator was removed and the sheath was flushed. Sheath was attached to pressurized and heparinized saline bag for constant forward flow. 125 cm parents stent diagnostic catheter was used to select the innominate artery. The catheter was advanced into the common carotid artery using a roadrunner diagnostic wire. Wire was removed and angiogram was performed in the distal common carotid artery. The catheter was then directed towards the external carotid artery and a rose in wire was placed. A 95cm 087 "Walrus" balloon guide was then advanced on the Rose an wire. Once the balloon guide was position in the distal cervical ICA, rose in wire was removed. Baseline angiogram was again performed with roadmap achieved. A rapid transit microcatheter with a coaxial synchro soft wire was used to probe and cross the proximal cervical ICA occlusion. The wire was advanced distally with the rapid transit catheter, to the distal cervical ICA. Wire was removed and initial angiogram was performed. This proved a luminal position. A exchange length, 300 cm transcend wire with a floppy tip was then placed and the rapid transit was removed. Standard balloon angioplasty with a 4 mm by 40 mm Viatrac was performed at the carotid bulb. The balloon was inflated to 10.2 atmosphere. The balloon was deflated and the balloon guide was then advanced on the wire and the balloon shaft. A distal position of the cervical ICA was achieved. The balloon and the transcend wire were removed. Adequate flush was achieved at the balloon guide and then a new diagnostic angiogram was performed for road mapping of the intracranial occlusion. We identified the target as inferior division, M2, with a dominant perfusion to the parietal lobe. Copious back flush was performed and the balloon catheter was attached to heparinized and  pressurized saline bag for forward flow. A second coaxial system was then advanced through the balloon catheter, which included the selected intermediate catheter, microcatheter, and microwire. In this scenario, the set up included a zoom 55 intermediate catheter, a Trevo Provue18 microcatheter, and 014 synchro soft wire. This system was advanced through the balloon guide catheter under the road-map function, with adequate back-flush at the rotating hemostatic valve at that back end of the balloon guide. Microcatheter and the intermediate catheter system were advanced through the terminal ICA and MCA to the level of the inferior division M2 occlusion. The micro wire was then carefully advanced through the occluded segment. Microcatheter was then manipulated through the occluded segment and the wire was removed with saline drip at the hub. Blood was then aspirated through the hub of the microcatheter, and a gentle contrast injection was performed confirming intraluminal position. A rotating hemostatic valve was then attached to the back end of the microcatheter, and a pressurized and heparinized saline bag was attached to the catheter. 3 x 20 solitaire device was then selected. Back flush was achieved at the rotating hemostatic valve, and then the device was gently advanced through the microcatheter to the distal end. The retriever was then unsheathed by withdrawing the microcatheter under fluoroscopy. Once the retriever was completely unsheathed, the microcatheter was carefully stripped from the delivery device. Control angiogram was performed from the intermediate catheter. A 3 minute time interval was observed. The balloon at the balloon guide catheter was then inflated under fluoroscopy for proximal flow arrest. Constant aspiration using the proprietary engine was then performed at the intermediate catheter, as the retriever was gently and slowly withdrawn with fluoroscopic observation. Once the retriever was  "corked" within the tip  of the intermediate catheter, both were removed from the system. Free aspiration was confirmed at the hub of the balloon guide catheter, with free blood return confirmed. The balloon was then deflated, and a control angiogram was performed. Persisting occlusion of the inferior division was observed. We elected to perform a second pass. Second pass: The same coaxial system was advanced using a zoom 55 intermediate catheter, a Trevo Provue18 microcatheter, and 014 synchro soft wire. This system was advanced through the balloon guide catheter under the road-map function, with adequate back-flush at the rotating hemostatic valve at that back end of the balloon guide. Microcatheter and the intermediate catheter system were advanced through the terminal ICA and MCA to the level of the inferior division M2 occlusion. The micro wire and catheter were then removed. A direct aspiration strategy was then used with the zoom 55 catheter. The proprietary engine was turned on and the catheter was advanced slowly until there was no longer aspiration of blood products. Approximately 20 seconds-30 seconds was observed and the catheter was removed. Adequate aspiration was then performed at the hub of the balloon guide and another angiogram was performed. Restoration of flow was confirmed. Transcend wire was then advanced through the balloon guide and the balloon guide was withdrawn to the common carotid artery. Angiogram of the cervical ICA was performed. Flat panel CT was performed. After the imaging was performed of the head and the angiogram of the cervical ICA was reviewed, results were discussed with the neurology team. At this point, given the estimated 40% narrowing of the cervical ICA and the subarachnoid hemorrhage, we elected to terminate the case for observation, and surveillance of the carotid disease. The balloon guide and the wire were removed. A left-sided cervical angiogram was then performed  using a JB 1 catheter. Catheter was removed. The skin at the puncture site was then cleaned with Chlorhexidine. The 8 French sheath was removed and an 32F angioseal was deployed. Patient was extubated. Patient tolerated the procedure well and remained hemodynamically stable throughout. No complications were encountered and no significant blood loss encountered. IMPRESSION: Status post ultrasound guided access right common femoral artery for bilateral cervical/cerebral angiogram, and treatment of right-sided cervical ICA/MCA tandem occlusion, treating the cervical ICA occlusion with 4 mm balloon angioplasty, and restoring TICI 3 perfusion to the inferior division M2 branch with 2 passes of combination stent retriever and direct aspiration. Angio-Seal for closure. Signed, Dulcy Fanny. Dellia Nims, [facility] PLAN: Patient extubated. ICU status Target systolic blood pressure of 120-140 Right hip straight time 6 hours Frequent neurovascular checks Repeat neurologic imaging with CT and/MRI at the discretion of neurology team Continue aspirin antiplatelets Carotid duplex for carotid surveillance Electronically Signed   By: Corrie Mckusick D.O.   On: 04/20/2020 05:29    Labs:  CBC: Recent Labs    04/20/20 0011 04/20/20 0022 04/20/20 0500  WBC 7.2  --  10.4  HGB 13.6 13.6 13.4  HCT 41.1 40.0 39.8  PLT 338  --  356    COAGS: Recent Labs    04/20/20 0011  INR 0.9  APTT 32    BMP: Recent Labs    04/20/20 0011 04/20/20 0022 04/20/20 0500  NA 135 139 132*  K 4.1 3.9 4.3  CL 102 101 102  CO2 26  --  23  GLUCOSE 231* 227* 316*  BUN '17 19 17  '$ CALCIUM 8.6*  --  8.0*  CREATININE 2.10* 1.90* 2.01*  GFRNONAA  36*  --  38*    LIVER FUNCTION TESTS: Recent Labs    04/20/20 0011 04/20/20 0500  BILITOT 0.7 0.6  AST 27 34  ALT 18 23  ALKPHOS 63 68  PROT 5.9* 5.4*  ALBUMIN 2.6* 2.5*    Assessment and Plan:  History of acute CVA s/p  cerebral arteriogram with emergent mechanical thrombectomy of right ICA bifurcation occlusion and right MCA M2 occlusion achieving a TICI 3 revascularization via right femoral approach 04/20/2020 by Dr. Earleen Newport. Dr. Earleen Newport at bedside, discussed procedure with patient. All questions answered and concerns addressed. Patient's condition stable- awake and alert, follows simple commands, can spontaneously move right side, minimal movements of left side (left hand grip intact, LLE withdraws from pain). Right femoral puncture site stable, distal pulses (DPs) 2+ bilaterally. Cerebral arteriogram revealed underlying right ICA stenosis (42%), stent was not placed during this procedure secondary to [facility]- recommend possible revascularization this admission at discretion of neurology (please consult NIR if this is desired during this admission). For MR brain today. Further plans per neurology- appreciate and agree with management. NIR to follow.   Electronically Signed: Earley Abide, PA-C 04/20/2020, 1:18 PM   I spent a total of 15 Minutes at the the patient's bedside AND on the patient's hospital floor or unit, greater than 50% of which was counseling/coordinating care for CVA s/p revascularization.

## 2020-04-20 NOTE — Transfer of Care (Signed)
Immediate Anesthesia Transfer of Care Note  Patient: Bobby Branch  Procedure(s) Performed: IR WITH ANESTHESIA (N/A )  Patient Location: PACU  Anesthesia Type:General  Level of Consciousness: drowsy, patient cooperative and responds to stimulation  Airway & Oxygen Therapy: Patient Spontanous Breathing and Patient connected to face mask oxygen  Post-op Assessment: Report given to RN and Post -op Vital signs reviewed and stable  Post vital signs: Reviewed and stable  Last Vitals:  Vitals Value Taken Time  BP 140/82   Temp    Pulse 100   Resp 16   SpO2 100     Last Pain:  Vitals:   04/20/20 0053  TempSrc:   PainSc: 0-No pain     Report to Chip RN in PACU, Cleviprex gtt maintained for SBP control of goal 120-140 mmHg, VSS. Patient currently moving right side only. MD team aware. Patient responds via voice and follows commands.    Complications: No complications documented.

## 2020-04-20 NOTE — Progress Notes (Signed)
STROKE TEAM PROGRESS NOTE   Dorrian Thornes is a 60 y.o. 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 CTH with ASPECTS of 10. ATS with R M2/3 occlusion, R ICA occlusion with distal intracranial reconstitution, L ICA string sign at the bifurcation. Outside tPA at arrival. Taken to IR for thrombectomy.   INTERVAL HISTORY No acute events since arrival.  Patient is extubated and seems to be breathing well.  He is eating applesauce.  Blood pressure adequately controlled.  Patient is seen in PACU. He is tearful and anxious regarding his status.   Vitals:   04/20/20 0709 04/20/20 0724 04/20/20 0739 04/20/20 0749  BP: 136/82 137/78    Pulse: 84 86  98  Resp: '14 12  12  '$ Temp:   97.6 F (36.4 C)   TempSrc:      SpO2: 100% 100%  100%   CBC:  Recent Labs  Lab 04/20/20 0011 04/20/20 0022 04/20/20 0500  WBC 7.2  --  10.4  NEUTROABS 4.8  --   --   HGB 13.6 13.6 13.4  HCT 41.1 40.0 39.8  MCV 94.9  --  94.1  PLT 338  --  [id number]   Basic Metabolic Panel:  Recent Labs  Lab 04/20/20 0011 04/20/20 0022 04/20/20 0500  NA 135 139 132*  K 4.1 3.9 4.3  CL 102 101 102  CO2 26  --  23  GLUCOSE 231* 227* 316*  BUN '17 19 17  '$ CREATININE 2.10* 1.90* 2.01*  CALCIUM 8.6*  --  8.0*   Lipid Panel:  Recent Labs  Lab 04/20/20 0500  CHOL 229*  TRIG 238*  HDL 83  CHOLHDL 2.8  VLDL 48*  LDLCALC 98   HgbA1c: No results for input(s): HGBA1C in the last 168 hours. Urine Drug Screen: No results for input(s): LABOPIA, COCAINSCRNUR, LABBENZ, AMPHETMU, THCU, LABBARB in the last 168 hours.  Alcohol Level  Recent Labs  Lab 04/20/20 0011  ETH <10    IMAGING past 24 hours IR CT [facility]  Result Date: 04/20/2020 INDICATION: 60 year old male with history acute right MCA syndrome, presents for attempt  at mechanical thrombectomy and treatment. EXAM: ULTRASOUND-GUIDED ACCESS RIGHT COMMON FEMORAL ARTERY RIGHT-SIDED CERVICAL AND CEREBRAL ANGIOGRAM LEFT-SIDED CERVICAL ANGIOGRAM BALLOON ANGIOPLASTY OCCLUDED RIGHT CERVICAL ICA MECHANICAL THROMBECTOMY RIGHT MCA M2 DIVISION ANGIO-SEAL FOR HEMOSTASIS COMPARISON:  CT imaging same day MEDICATIONS: 300 mg rectal aspirin ANESTHESIA/SEDATION: The anesthesia team was present to provide general endotracheal tube anesthesia and for patient monitoring during the procedure. Intubation was performed in negative pressure Bay in neuro IR holding. Left radial arterial line was performed by the anesthesia team. Interventional neuro radiology nursing staff was also present. CONTRAST:  100 cc IV contrast FLUOROSCOPY TIME:  Fluoroscopy Time: 18 minutes 12 seconds (889 mGy). COMPLICATIONS: SIR LEVEL B - Normal therapy, includes overnight admission for observation. Right-sided sylvian subarachnoid hemorrhage TECHNIQUE: Informed written consent was obtained from the patient's family after a thorough discussion of the procedural risks, benefits and alternatives. Specific risks discussed include: Bleeding, infection, contrast reaction, kidney injury/failure, need for further procedure/surgery, arterial injury or dissection, embolization to new territory, intracranial hemorrhage (10-15% risk), neurologic deterioration, cardiopulmonary collapse, death. All questions were addressed. Maximal Sterile Barrier Technique was utilized including during the procedure including caps, mask, sterile gowns, sterile gloves,  sterile drape, hand hygiene and skin antiseptic. A timeout was performed prior to the initiation of the procedure. The anesthesia team was present to provide general endotracheal tube anesthesia and for patient monitoring during the procedure. Interventional neuro radiology nursing staff was also present. FINDINGS: Initial Findings: Right common carotid artery:  Normal course caliber and  contour. Right external carotid artery: Patent with antegrade flow. Right internal carotid artery: Right ICA occluded at the cervical ICA. Calcified plaque present at the bulb. Right MCA: After initial crossing and angioplasty of the cervical ICA, repeat angiogram was performed at the skull base. This demonstrated a transient M1 occlusion, which then again migrated distally into the inferior division. Initial perfusion of the targeted vessel is TICI 0: No perfusion or antegrade flow beyond site of occlusion Right ACA:  A 1 segment patent. Completion Findings: Right MCA: Patent. The targeted artery, inferior division, dominant artery to the parietal region, final score of TICI 3: Complete perfusion of the territory Flat panel CT demonstrates small volume subarachnoid hemorrhage in the right sylvian sulci. Final angiogram of the cervical ICA after balloon angioplasty demonstrates persisting stenosis, measured 42% by NASCET CT criteria. TICI 3 Flat panel CT Findings: Left common carotid artery:  Normal course caliber and contour. Left external carotid artery: Patent with antegrade flow. Left internal carotid artery: Irregular string sign at the proximal left cervical ICA, with high-grade stenosis. NASCET criteria measurement is 79% stenosis PROCEDURE: The anesthesia team was present to provide general endotracheal tube anesthesia and for patient monitoring during the procedure. Intubation was performed in negative pressure Bay in neuro IR holding. Interventional neuro radiology nursing staff was also present. Ultrasound survey of the right inguinal region was performed with images stored and sent to PACs. 11 blade scalpel was used to make a small incision. Blunt dissection was performed with US guidance. A micropuncture needle was used access the right common femoral artery under ultrasound. With excellent arterial blood flow returned, an .018 micro wire was passed through the needle, observed to enter the abdominal  aorta under fluoroscopy. The needle was removed, and a micropuncture sheath was placed over the wire. The inner dilator and wire were removed, and an 035 wire was advanced under fluoroscopy into the abdominal aorta. The sheath was removed and a 25cm 56F straight vascular sheath was placed. The dilator was removed and the sheath was flushed. Sheath was attached to pressurized and heparinized saline bag for constant forward flow. 125 cm parents stent diagnostic catheter was used to select the innominate artery. The catheter was advanced into the common carotid artery using a roadrunner diagnostic wire. Wire was removed and angiogram was performed in the distal common carotid artery. The catheter was then directed towards the external carotid artery and a rose in wire was placed. A 95cm 087 "Walrus" balloon guide was then advanced on the Rose an wire. Once the balloon guide was position in the distal cervical ICA, rose in wire was removed. Baseline angiogram was again performed with roadmap achieved. A rapid transit microcatheter with a coaxial synchro soft wire was used to probe and cross the proximal cervical ICA occlusion. The wire was advanced distally with the rapid transit catheter, to the distal cervical ICA. Wire was removed and initial angiogram was performed. This proved a luminal position. A exchange length, 300 cm transcend wire with a floppy tip was then placed and the rapid transit was removed. Standard balloon angioplasty with a 4 mm by 40 mm Viatrac was performed at the carotid  bulb. The balloon was inflated to 10.2 atmosphere. The balloon was deflated and the balloon guide was then advanced on the wire and the balloon shaft. A distal position of the cervical ICA was achieved. The balloon and the transcend wire were removed. Adequate flush was achieved at the balloon guide and then a new diagnostic angiogram was performed for road mapping of the intracranial occlusion. We identified the target as  inferior division, M2, with a dominant perfusion to the parietal lobe. Copious back flush was performed and the balloon catheter was attached to heparinized and pressurized saline bag for forward flow. A second coaxial system was then advanced through the balloon catheter, which included the selected intermediate catheter, microcatheter, and microwire. In this scenario, the set up included a zoom 55 intermediate catheter, a Trevo Provue18 microcatheter, and 014 synchro soft wire. This system was advanced through the balloon guide catheter under the road-map function, with adequate back-flush at the rotating hemostatic valve at that back end of the balloon guide. Microcatheter and the intermediate catheter system were advanced through the terminal ICA and MCA to the level of the inferior division M2 occlusion. The micro wire was then carefully advanced through the occluded segment. Microcatheter was then manipulated through the occluded segment and the wire was removed with saline drip at the hub. Blood was then aspirated through the hub of the microcatheter, and a gentle contrast injection was performed confirming intraluminal position. A rotating hemostatic valve was then attached to the back end of the microcatheter, and a pressurized and heparinized saline bag was attached to the catheter. 3 x 20 solitaire device was then selected. Back flush was achieved at the rotating hemostatic valve, and then the device was gently advanced through the microcatheter to the distal end. The retriever was then unsheathed by withdrawing the microcatheter under fluoroscopy. Once the retriever was completely unsheathed, the microcatheter was carefully stripped from the delivery device. Control angiogram was performed from the intermediate catheter. A 3 minute time interval was observed. The balloon at the balloon guide catheter was then inflated under fluoroscopy for proximal flow arrest. Constant aspiration using the proprietary  engine was then performed at the intermediate catheter, as the retriever was gently and slowly withdrawn with fluoroscopic observation. Once the retriever was "corked" within the tip of the intermediate catheter, both were removed from the system. Free aspiration was confirmed at the hub of the balloon guide catheter, with free blood return confirmed. The balloon was then deflated, and a control angiogram was performed. Persisting occlusion of the inferior division was observed. We elected to perform a second pass. Second pass: The same coaxial system was advanced using a zoom 55 intermediate catheter, a Trevo Provue18 microcatheter, and 014 synchro soft wire. This system was advanced through the balloon guide catheter under the road-map function, with adequate back-flush at the rotating hemostatic valve at that back end of the balloon guide. Microcatheter and the intermediate catheter system were advanced through the terminal ICA and MCA to the level of the inferior division M2 occlusion. The micro wire and catheter were then removed. A direct aspiration strategy was then used with the zoom 55 catheter. The proprietary engine was turned on and the catheter was advanced slowly until there was no longer aspiration of blood products. Approximately 20 seconds-30 seconds was observed and the catheter was removed. Adequate aspiration was then performed at the hub of the balloon guide and another angiogram was performed. Restoration of flow was confirmed. Transcend wire was then advanced  through the balloon guide and the balloon guide was withdrawn to the common carotid artery. Angiogram of the cervical ICA was performed. Flat panel CT was performed. After the imaging was performed of the head and the angiogram of the cervical ICA was reviewed, results were discussed with the neurology team. At this point, given the estimated 40% narrowing of the cervical ICA and the subarachnoid hemorrhage, we elected to terminate the  case for observation, and surveillance of the carotid disease. The balloon guide and the wire were removed. A left-sided cervical angiogram was then performed using a JB 1 catheter. Catheter was removed. The skin at the puncture site was then cleaned with Chlorhexidine. The 8 French sheath was removed and an 39F angioseal was deployed. Patient was extubated. Patient tolerated the procedure well and remained hemodynamically stable throughout. No complications were encountered and no significant blood loss encountered. IMPRESSION: Status post ultrasound guided access right common femoral artery for bilateral cervical/cerebral angiogram, and treatment of right-sided cervical ICA/MCA tandem occlusion, treating the cervical ICA occlusion with 4 mm balloon angioplasty, and restoring TICI 3 perfusion to the inferior division M2 branch with 2 passes of combination stent retriever and direct aspiration. Angio-Seal for closure. Signed, Dulcy Fanny. Dellia Nims, [place] Radiology PLAN: Patient extubated. ICU status Target systolic blood pressure of 120-140 Right hip straight time 6 hours Frequent neurovascular checks Repeat neurologic imaging with CT and/MRI at the discretion of neurology team Continue aspirin antiplatelets Carotid duplex for carotid surveillance Electronically Signed   By: Corrie Mckusick D.O.   On: 04/20/2020 05:29   IR US Guide Vasc Access Right  Result Date: 04/20/2020 INDICATION: 60 year old male with history acute right MCA syndrome, presents for attempt at mechanical thrombectomy and treatment. EXAM: ULTRASOUND-GUIDED ACCESS RIGHT COMMON FEMORAL ARTERY RIGHT-SIDED CERVICAL AND CEREBRAL ANGIOGRAM LEFT-SIDED CERVICAL ANGIOGRAM BALLOON ANGIOPLASTY OCCLUDED RIGHT CERVICAL ICA MECHANICAL THROMBECTOMY RIGHT MCA M2 DIVISION ANGIO-SEAL FOR HEMOSTASIS COMPARISON:  CT imaging same day MEDICATIONS: 300 mg rectal aspirin ANESTHESIA/SEDATION: The anesthesia team  was present to provide general endotracheal tube anesthesia and for patient monitoring during the procedure. Intubation was performed in negative pressure Bay in neuro IR holding. Left radial arterial line was performed by the anesthesia team. Interventional neuro radiology nursing staff was also present. CONTRAST:  100 cc IV contrast FLUOROSCOPY TIME:  Fluoroscopy Time: 18 minutes 12 seconds (889 mGy). COMPLICATIONS: SIR LEVEL B - Normal therapy, includes overnight admission for observation. Right-sided sylvian subarachnoid hemorrhage TECHNIQUE: Informed written consent was obtained from the patient's family after a thorough discussion of the procedural risks, benefits and alternatives. Specific risks discussed include: Bleeding, infection, contrast reaction, kidney injury/failure, need for further procedure/surgery, arterial injury or dissection, embolization to new territory, intracranial hemorrhage (10-15% risk), neurologic deterioration, cardiopulmonary collapse, death. All questions were addressed. Maximal Sterile Barrier Technique was utilized including during the procedure including caps, mask, sterile gowns, sterile gloves, sterile drape, hand hygiene and skin antiseptic. A timeout was performed prior to the initiation of the procedure. The anesthesia team was present to provide general endotracheal tube anesthesia and for patient monitoring during the procedure. Interventional neuro radiology nursing staff was also present. FINDINGS: Initial Findings: Right common carotid artery:  Normal course caliber and contour. Right external carotid artery: Patent with antegrade flow. Right internal carotid artery: Right ICA occluded at the cervical ICA. Calcified plaque present at the bulb. Right MCA: After initial crossing and angioplasty of the cervical ICA, repeat angiogram was performed at the skull base.  This demonstrated a transient M1 occlusion, which then again migrated distally into the inferior division.  Initial perfusion of the targeted vessel is TICI 0: No perfusion or antegrade flow beyond site of occlusion Right ACA:  A 1 segment patent. Completion Findings: Right MCA: Patent. The targeted artery, inferior division, dominant artery to the parietal region, final score of TICI 3: Complete perfusion of the territory Flat panel CT demonstrates small volume subarachnoid hemorrhage in the right sylvian sulci. Final angiogram of the cervical ICA after balloon angioplasty demonstrates persisting stenosis, measured 42% by NASCET CT criteria. TICI 3 Flat panel CT Findings: Left common carotid artery:  Normal course caliber and contour. Left external carotid artery: Patent with antegrade flow. Left internal carotid artery: Irregular string sign at the proximal left cervical ICA, with high-grade stenosis. NASCET criteria measurement is 79% stenosis PROCEDURE: The anesthesia team was present to provide general endotracheal tube anesthesia and for patient monitoring during the procedure. Intubation was performed in negative pressure Bay in neuro IR holding. Interventional neuro radiology nursing staff was also present. Ultrasound survey of the right inguinal region was performed with images stored and sent to PACs. 11 blade scalpel was used to make a small incision. Blunt dissection was performed with US guidance. A micropuncture needle was used access the right common femoral artery under ultrasound. With excellent arterial blood flow returned, an .018 micro wire was passed through the needle, observed to enter the abdominal aorta under fluoroscopy. The needle was removed, and a micropuncture sheath was placed over the wire. The inner dilator and wire were removed, and an 035 wire was advanced under fluoroscopy into the abdominal aorta. The sheath was removed and a 25cm 64F straight vascular sheath was placed. The dilator was removed and the sheath was flushed. Sheath was attached to pressurized and heparinized saline bag for  constant forward flow. 125 cm parents stent diagnostic catheter was used to select the innominate artery. The catheter was advanced into the common carotid artery using a roadrunner diagnostic wire. Wire was removed and angiogram was performed in the distal common carotid artery. The catheter was then directed towards the external carotid artery and a rose in wire was placed. A 95cm 087 "Walrus" balloon guide was then advanced on the Rose an wire. Once the balloon guide was position in the distal cervical ICA, rose in wire was removed. Baseline angiogram was again performed with roadmap achieved. A rapid transit microcatheter with a coaxial synchro soft wire was used to probe and cross the proximal cervical ICA occlusion. The wire was advanced distally with the rapid transit catheter, to the distal cervical ICA. Wire was removed and initial angiogram was performed. This proved a luminal position. A exchange length, 300 cm transcend wire with a floppy tip was then placed and the rapid transit was removed. Standard balloon angioplasty with a 4 mm by 40 mm Viatrac was performed at the carotid bulb. The balloon was inflated to 10.2 atmosphere. The balloon was deflated and the balloon guide was then advanced on the wire and the balloon shaft. A distal position of the cervical ICA was achieved. The balloon and the transcend wire were removed. Adequate flush was achieved at the balloon guide and then a new diagnostic angiogram was performed for road mapping of the intracranial occlusion. We identified the target as inferior division, M2, with a dominant perfusion to the parietal lobe. Copious back flush was performed and the balloon catheter was attached to heparinized and pressurized saline bag for forward  flow. A second coaxial system was then advanced through the balloon catheter, which included the selected intermediate catheter, microcatheter, and microwire. In this scenario, the set up included a zoom 55 intermediate  catheter, a Trevo Provue18 microcatheter, and 014 synchro soft wire. This system was advanced through the balloon guide catheter under the road-map function, with adequate back-flush at the rotating hemostatic valve at that back end of the balloon guide. Microcatheter and the intermediate catheter system were advanced through the terminal ICA and MCA to the level of the inferior division M2 occlusion. The micro wire was then carefully advanced through the occluded segment. Microcatheter was then manipulated through the occluded segment and the wire was removed with saline drip at the hub. Blood was then aspirated through the hub of the microcatheter, and a gentle contrast injection was performed confirming intraluminal position. A rotating hemostatic valve was then attached to the back end of the microcatheter, and a pressurized and heparinized saline bag was attached to the catheter. 3 x 20 solitaire device was then selected. Back flush was achieved at the rotating hemostatic valve, and then the device was gently advanced through the microcatheter to the distal end. The retriever was then unsheathed by withdrawing the microcatheter under fluoroscopy. Once the retriever was completely unsheathed, the microcatheter was carefully stripped from the delivery device. Control angiogram was performed from the intermediate catheter. A 3 minute time interval was observed. The balloon at the balloon guide catheter was then inflated under fluoroscopy for proximal flow arrest. Constant aspiration using the proprietary engine was then performed at the intermediate catheter, as the retriever was gently and slowly withdrawn with fluoroscopic observation. Once the retriever was "corked" within the tip of the intermediate catheter, both were removed from the system. Free aspiration was confirmed at the hub of the balloon guide catheter, with free blood return confirmed. The balloon was then deflated, and a control angiogram was  performed. Persisting occlusion of the inferior division was observed. We elected to perform a second pass. Second pass: The same coaxial system was advanced using a zoom 55 intermediate catheter, a Trevo Provue18 microcatheter, and 014 synchro soft wire. This system was advanced through the balloon guide catheter under the road-map function, with adequate back-flush at the rotating hemostatic valve at that back end of the balloon guide. Microcatheter and the intermediate catheter system were advanced through the terminal ICA and MCA to the level of the inferior division M2 occlusion. The micro wire and catheter were then removed. A direct aspiration strategy was then used with the zoom 55 catheter. The proprietary engine was turned on and the catheter was advanced slowly until there was no longer aspiration of blood products. Approximately 20 seconds-30 seconds was observed and the catheter was removed. Adequate aspiration was then performed at the hub of the balloon guide and another angiogram was performed. Restoration of flow was confirmed. Transcend wire was then advanced through the balloon guide and the balloon guide was withdrawn to the common carotid artery. Angiogram of the cervical ICA was performed. Flat panel CT was performed. After the imaging was performed of the head and the angiogram of the cervical ICA was reviewed, results were discussed with the neurology team. At this point, given the estimated 40% narrowing of the cervical ICA and the subarachnoid hemorrhage, we elected to terminate the case for observation, and surveillance of the carotid disease. The balloon guide and the wire were removed. A left-sided cervical angiogram was then performed using a JB 1 catheter.  Catheter was removed. The skin at the puncture site was then cleaned with Chlorhexidine. The 8 French sheath was removed and an 10F angioseal was deployed. Patient was extubated. Patient tolerated the procedure well and remained  hemodynamically stable throughout. No complications were encountered and no significant blood loss encountered. IMPRESSION: Status post ultrasound guided access right common femoral artery for bilateral cervical/cerebral angiogram, and treatment of right-sided cervical ICA/MCA tandem occlusion, treating the cervical ICA occlusion with 4 mm balloon angioplasty, and restoring TICI 3 perfusion to the inferior division M2 branch with 2 passes of combination stent retriever and direct aspiration. Angio-Seal for closure. Signed, Dulcy Fanny. Dellia Nims, [place] [facility] [facility] Radiology PLAN: Patient extubated. ICU status Target systolic blood pressure of 120-140 Right hip straight time 6 hours Frequent neurovascular checks Repeat neurologic imaging with CT and/MRI at the discretion of neurology team Continue aspirin antiplatelets Carotid duplex for carotid surveillance Electronically Signed   By: Corrie Mckusick D.O.   On: 04/20/2020 05:29   CT CEREBRAL PERFUSION W CONTRAST  Result Date: 04/20/2020 CLINICAL DATA:  Initial evaluation for acute stroke, left-sided weakness. EXAM: CT ANGIOGRAPHY HEAD AND NECK CT PERFUSION BRAIN TECHNIQUE: Multidetector CT imaging of the head and neck was performed using the standard protocol during bolus administration of intravenous contrast. Multiplanar CT image reconstructions and MIPs were obtained to evaluate the vascular anatomy. Carotid stenosis measurements (when applicable) are obtained utilizing NASCET criteria, using the distal internal carotid diameter as the denominator. Multiphase CT imaging of the brain was performed following IV bolus contrast injection. Subsequent parametric perfusion maps were calculated using RAPID software. CONTRAST:  117m OMNIPAQUE IOHEXOL 350 MG/ML SOLN COMPARISON:  None. FINDINGS: CTA NECK FINDINGS Aortic arch: Visualized aortic arch normal in caliber with normal branch pattern. No hemodynamically significant  stenosis about the origin of the great vessels. Visualized subclavian arteries widely patent. Right carotid system: Right CCA patent from its origin to the bifurcation without stenosis. There is abrupt occlusion of the proximal right ICA just beyond the right bifurcation. Right ICA remains occluded within the neck. Left carotid system: Left CCA patent from its origin to the bifurcation without stenosis. There is severe near occlusive stenosis of the proximal left ICA just beyond the left bifurcation. A radiographic string sign is present. Stenosis begins just distal to the bifurcation, and measures approximately 2.1 cm in length (series 510, image 399). Left ICA otherwise patent distally to the skull base without stenosis, dissection, or occlusion. Vertebral arteries: Both vertebral arteries arise from the subclavian arteries. Vertebral arteries patent within the neck without stenosis, dissection or occlusion. Skeleton: Exaggeration of the normal thoracic kyphosis. No visible acute osseous finding. No discrete or worrisome osseous lesions. Other neck: No other acute soft tissue abnormality within the neck. No mass or adenopathy. Poor dentition noted. Upper chest: Visualized upper chest demonstrates no acute finding. Review of the MIP images confirms the above findings CTA HEAD FINDINGS Anterior circulation: Right ICA remains occluded at the skull base. Distal reconstitution at the cavernous segment via collateral flow across the circle-of-Willis. Right M1 segment perfused to the right MCA bifurcation. There is downstream occlusion of a distal right M2/M3 branch, inferior division (series 510, image 181). Some attenuated flow seen distally, which could be related to subocclusive thrombus and/or collateralization. Remainder of the right MCA branches perfused. Petrous left ICA widely patent. Atheromatous change within the cavernous left ICA without significant stenosis. A1 segments patent bilaterally. Normal anterior  communicating artery complex. Anterior cerebral arteries patent  to their distal aspects without stenosis. Left M1 widely patent. Normal left MCA bifurcation. Distal left MCA branches well perfused. Posterior circulation: Both V4 segments patent to the vertebrobasilar junction without stenosis. Left vertebral slightly dominant. Both PICA origins patent and normal. Basilar patent to its distal aspect without stenosis. Superior cerebellar arteries patent bilaterally. Both PCA supplied via the basilar as well as small bilateral posterior communicating arteries. PCAs well perfused to their distal aspects. Venous sinuses: Grossly patent allowing for timing the contrast bolus. Anatomic variants: None significant.  No intracranial aneurysm. Review of the MIP images confirms the above findings CT Brain Perfusion Findings: ASPECTS: 10. CBF (<30%) Volume: 66m Perfusion (Tmax>6.0s) volume: 823mMismatch Volume: 6739mnfarction Location:Acute core infarct involving the posterior right MCA distribution at the right frontoparietal region. 67 cc surrounding penumbra. IMPRESSION: CTA HEAD AND NECK IMPRESSION: 1. Positive CTA for with abrupt occlusion of the proximal right ICA just beyond the right bifurcation. Distal reconstitution at the cavernous segment via collateral flow across the circle-of-Willis. Subsequent downstream occlusion at a distal right M2/M3 branch, inferior division. 2. 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 3. Additional mild atheromatous change elsewhere about the major arterial vasculature of the head and neck. No other high-grade or correctable stenosis. 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 Critical Value/emergent results were called by telephone at the time of interpretation on 04/20/2020 at 12:30 am to [facility]  acknowledged these results. Electronically Signed   By: BenJeannine BogaD.   On: 04/20/2020 01:24   IR PERCUTANEOUS ART THROMBECTOMY/INFUSION INTRACRANIAL INC DIAG ANGIO  Result Date: 04/20/2020 INDICATION: [age]ar old male with history acute right MCA syndrome, presents for attempt at mechanical thrombectomy and treatment. EXAM: ULTRASOUND-GUIDED ACCESS RIGHT COMMON FEMORAL ARTERY RIGHT-SIDED CERVICAL AND CEREBRAL ANGIOGRAM LEFT-SIDED CERVICAL ANGIOGRAM BALLOON ANGIOPLASTY OCCLUDED RIGHT CERVICAL ICA MECHANICAL THROMBECTOMY RIGHT MCA M2 DIVISION ANGIO-SEAL FOR HEMOSTASIS COMPARISON:  CT imaging same day MEDICATIONS: 300 mg rectal aspirin ANESTHESIA/SEDATION: The anesthesia team was present to provide general endotracheal tube anesthesia and for patient monitoring during the procedure. Intubation was performed in negative pressure [place] in neuro IR holding. Left radial arterial line was performed by the anesthesia team. Interventional neuro radiology nursing staff was also present. CONTRAST:  100 cc IV contrast FLUOROSCOPY TIME:  Fluoroscopy Time: 18 minutes 12 seconds (889 mGy). COMPLICATIONS: SIR LEVEL B - Normal therapy, includes overnight admission for observation. Right-sided sylvian subarachnoid hemorrhage TECHNIQUE: Informed written consent was obtained from the patient's family after a thorough discussion of the procedural risks, benefits and alternatives. Specific risks discussed include: Bleeding, infection, contrast reaction, kidney injury/failure, need for further procedure/surgery, arterial injury or dissection, embolization to new territory, intracranial hemorrhage (10-15% risk), neurologic deterioration, cardiopulmonary collapse, death. All questions were addressed. Maximal Sterile Barrier Technique was utilized including during the procedure including caps, mask, sterile gowns, sterile gloves, sterile drape, hand hygiene and skin antiseptic. A timeout was performed prior to the initiation of  the procedure. The anesthesia team was present to provide general endotracheal tube anesthesia and for patient monitoring during the procedure. Interventional neuro radiology nursing staff was also present. FINDINGS: Initial Findings: Right common carotid artery:  Normal course caliber and contour. Right external carotid artery: Patent with antegrade flow. Right internal carotid artery: Right ICA occluded at the cervical ICA. Calcified plaque present at the bulb. Right MCA: After initial crossing and angioplasty  of the cervical ICA, repeat angiogram was performed at the skull base. This demonstrated a transient M1 occlusion, which then again migrated distally into the inferior division. Initial perfusion of the targeted vessel is TICI 0: No perfusion or antegrade flow beyond site of occlusion Right ACA:  A 1 segment patent. Completion Findings: Right MCA: Patent. The targeted artery, inferior division, dominant artery to the parietal region, final score of TICI 3: Complete perfusion of the territory Flat panel CT demonstrates small volume subarachnoid hemorrhage in the right sylvian sulci. Final angiogram of the cervical ICA after balloon angioplasty demonstrates persisting stenosis, measured 42% by NASCET CT criteria. TICI 3 Flat panel CT Findings: Left common carotid artery:  Normal course caliber and contour. Left external carotid artery: Patent with antegrade flow. Left internal carotid artery: Irregular string sign at the proximal left cervical ICA, with high-grade stenosis. NASCET criteria measurement is 79% stenosis PROCEDURE: The anesthesia team was present to provide general endotracheal tube anesthesia and for patient monitoring during the procedure. Intubation was performed in negative pressure [place] in neuro IR holding. Interventional neuro radiology nursing staff was also present. Ultrasound survey of the right inguinal region was performed with images stored and sent to PACs. 11 blade scalpel was used to  make a small incision. Blunt dissection was performed with US guidance. A micropuncture needle was used access the right common femoral artery under ultrasound. With excellent arterial blood flow returned, an .018 micro wire was passed through the needle, observed to enter the abdominal aorta under fluoroscopy. The needle was removed, and a micropuncture sheath was placed over the wire. The inner dilator and wire were removed, and an 035 wire was advanced under fluoroscopy into the abdominal aorta. The sheath was removed and a 25cm 91F straight vascular sheath was placed. The dilator was removed and the sheath was flushed. Sheath was attached to pressurized and heparinized saline bag for constant forward flow. 125 cm parents stent diagnostic catheter was used to select the innominate artery. The catheter was advanced into the common carotid artery using a roadrunner diagnostic wire. Wire was removed and angiogram was performed in the distal common carotid artery. The catheter was then directed towards the external carotid artery and a rose in wire was placed. A 95cm 087 "Walrus" balloon guide was then advanced on the Rose an wire. Once the balloon guide was position in the distal cervical ICA, rose in wire was removed. Baseline angiogram was again performed with roadmap achieved. A rapid transit microcatheter with a coaxial synchro soft wire was used to probe and cross the proximal cervical ICA occlusion. The wire was advanced distally with the rapid transit catheter, to the distal cervical ICA. Wire was removed and initial angiogram was performed. This proved a luminal position. A exchange length, 300 cm transcend wire with a floppy tip was then placed and the rapid transit was removed. Standard balloon angioplasty with a 4 mm by 40 mm Viatrac was performed at the carotid bulb. The balloon was inflated to 10.2 atmosphere. The balloon was deflated and the balloon guide was then advanced on the wire and the balloon  shaft. A distal position of the cervical ICA was achieved. The balloon and the transcend wire were removed. Adequate flush was achieved at the balloon guide and then a new diagnostic angiogram was performed for road mapping of the intracranial occlusion. We identified the target as inferior division, M2, with a dominant perfusion to the parietal lobe. Copious back flush was performed and the  balloon catheter was attached to heparinized and pressurized saline bag for forward flow. A second coaxial system was then advanced through the balloon catheter, which included the selected intermediate catheter, microcatheter, and microwire. In this scenario, the set up included a zoom 55 intermediate catheter, a Trevo Provue18 microcatheter, and 014 synchro soft wire. This system was advanced through the balloon guide catheter under the road-map function, with adequate back-flush at the rotating hemostatic valve at that back end of the balloon guide. Microcatheter and the intermediate catheter system were advanced through the terminal ICA and MCA to the level of the inferior division M2 occlusion. The micro wire was then carefully advanced through the occluded segment. Microcatheter was then manipulated through the occluded segment and the wire was removed with saline drip at the hub. Blood was then aspirated through the hub of the microcatheter, and a gentle contrast injection was performed confirming intraluminal position. A rotating hemostatic valve was then attached to the back end of the microcatheter, and a pressurized and heparinized saline bag was attached to the catheter. 3 x 20 solitaire device was then selected. Back flush was achieved at the rotating hemostatic valve, and then the device was gently advanced through the microcatheter to the distal end. The retriever was then unsheathed by withdrawing the microcatheter under fluoroscopy. Once the retriever was completely unsheathed, the microcatheter was carefully  stripped from the delivery device. Control angiogram was performed from the intermediate catheter. A 3 minute time interval was observed. The balloon at the balloon guide catheter was then inflated under fluoroscopy for proximal flow arrest. Constant aspiration using the proprietary engine was then performed at the intermediate catheter, as the retriever was gently and slowly withdrawn with fluoroscopic observation. Once the retriever was "corked" within the tip of the intermediate catheter, both were removed from the system. Free aspiration was confirmed at the hub of the balloon guide catheter, with free blood return confirmed. The balloon was then deflated, and a control angiogram was performed. Persisting occlusion of the inferior division was observed. We elected to perform a second pass. Second pass: The same coaxial system was advanced using a zoom 55 intermediate catheter, a Trevo Provue18 microcatheter, and 014 synchro soft wire. This system was advanced through the balloon guide catheter under the road-map function, with adequate back-flush at the rotating hemostatic valve at that back end of the balloon guide. Microcatheter and the intermediate catheter system were advanced through the terminal ICA and MCA to the level of the inferior division M2 occlusion. The micro wire and catheter were then removed. A direct aspiration strategy was then used with the zoom 55 catheter. The proprietary engine was turned on and the catheter was advanced slowly until there was no longer aspiration of blood products. Approximately 20 seconds-30 seconds was observed and the catheter was removed. Adequate aspiration was then performed at the hub of the balloon guide and another angiogram was performed. Restoration of flow was confirmed. Transcend wire was then advanced through the balloon guide and the balloon guide was withdrawn to the common carotid artery. Angiogram of the cervical ICA was performed. Flat panel CT was  performed. After the imaging was performed of the head and the angiogram of the cervical ICA was reviewed, results were discussed with the neurology team. At this point, given the estimated 40% narrowing of the cervical ICA and the subarachnoid hemorrhage, we elected to terminate the case for observation, and surveillance of the carotid disease. The balloon guide and the wire were removed.  A left-sided cervical angiogram was then performed using a JB 1 catheter. Catheter was removed. The skin at the puncture site was then cleaned with Chlorhexidine. The 8 French sheath was removed and an 29F angioseal was deployed. Patient was extubated. Patient tolerated the procedure well and remained hemodynamically stable throughout. No complications were encountered and no significant blood loss encountered. IMPRESSION: Status post ultrasound guided access right common femoral artery for bilateral cervical/cerebral angiogram, and treatment of right-sided cervical ICA/MCA tandem occlusion, treating the cervical ICA occlusion with 4 mm balloon angioplasty, and restoring TICI 3 perfusion to the inferior division M2 branch with 2 passes of combination stent retriever and direct aspiration. Angio-Seal for closure. Signed, Dulcy Fanny. Dellia Nims, [place] [facility] Radiology PLAN: Patient extubated. ICU status Target systolic blood pressure of 120-140 Right hip straight time 6 hours Frequent neurovascular checks Repeat neurologic imaging with CT and/MRI at the discretion of neurology team Continue aspirin antiplatelets Carotid duplex for carotid surveillance Electronically Signed   By: Corrie Mckusick D.O.   On: 04/20/2020 05:29   CT HEAD CODE STROKE WO CONTRAST  Result Date: 04/20/2020 CLINICAL DATA:  Code stroke.  Initial evaluation for acute stroke. EXAM: CT HEAD WITHOUT CONTRAST TECHNIQUE: Contiguous axial images were obtained from the base of the skull through the vertex without  intravenous contrast. COMPARISON:  None. FINDINGS: Brain: Cerebral volume within normal limits. Few small remote lacunar infarcts present at the at the left basal ganglia. Associated wallerian degeneration on the right. No acute intracranial hemorrhage. No convincing acute or evolving large vessel territory ischemia. No mass lesion, midline shift or mass effect. No hydrocephalus or extra-axial fluid collection. Vascular: Asymmetric hyperdensity at the level of distal right M2/M3 branches at the right sylvian fissure, concerning for thrombus (series 3, image 16). Skull: Scalp soft tissues and calvarium within normal limits. Sinuses/Orbits: Right gaze noted. Sequelae of prior ORIF noted at the left face. Mild chronic mucosal thickening noted within the ethmoidal air cells and maxillary sinuses. No mastoid effusion. Other: None. ASPECTS [facility] Stroke Program Early CT Score) - Ganglionic level infarction (caudate, lentiform nuclei, internal capsule, insula, M1-M3 cortex): 7 - Supraganglionic infarction (M4-M6 cortex): 3 Total score (0-10 with 10 being normal): 10 IMPRESSION: 1. Asymmetric hyperdensity involving right M2/M3 branches, concerning for thrombus. Further evaluation with dedicated CTA recommended. No intracranial hemorrhage. 2. ASPECTS is 10. 3. Few remote lacunar infarcts about the bilateral basal ganglia. Critical Value/emergent results were called by telephone at the time of interpretation on 04/20/2020 at 12:30 am to provider Dr. Lorrin Goodell, who verbally acknowledged these results. Electronically Signed   By: Jeannine Boga M.D.   On: 04/20/2020 00:58   CT ANGIO HEAD CODE STROKE  Result Date: 04/20/2020 CLINICAL DATA:  Initial evaluation for acute stroke, left-sided weakness. EXAM: CT ANGIOGRAPHY HEAD AND NECK CT PERFUSION BRAIN TECHNIQUE: Multidetector CT imaging of the head and neck was performed using the standard protocol during bolus administration of intravenous contrast. Multiplanar CT  image reconstructions and MIPs were obtained to evaluate the vascular anatomy. Carotid stenosis measurements (when applicable) are obtained utilizing NASCET criteria, using the distal internal carotid diameter as the denominator. Multiphase CT imaging of the brain was performed following IV bolus contrast injection. Subsequent parametric perfusion maps were calculated using RAPID software. CONTRAST:  122m OMNIPAQUE IOHEXOL 350 MG/ML SOLN COMPARISON:  None. FINDINGS: CTA NECK FINDINGS Aortic arch: Visualized aortic arch normal in caliber with normal branch pattern. No hemodynamically significant stenosis about the origin of the  great vessels. Visualized subclavian arteries widely patent. Right carotid system: Right CCA patent from its origin to the bifurcation without stenosis. There is abrupt occlusion of the proximal right ICA just beyond the right bifurcation. Right ICA remains occluded within the neck. Left carotid system: Left CCA patent from its origin to the bifurcation without stenosis. There is severe near occlusive stenosis of the proximal left ICA just beyond the left bifurcation. A radiographic string sign is present. Stenosis begins just distal to the bifurcation, and measures approximately 2.1 cm in length (series 510, image 399). Left ICA otherwise patent distally to the skull base without stenosis, dissection, or occlusion. Vertebral arteries: Both vertebral arteries arise from the subclavian arteries. Vertebral arteries patent within the neck without stenosis, dissection or occlusion. Skeleton: Exaggeration of the normal thoracic kyphosis. No visible acute osseous finding. No discrete or worrisome osseous lesions. Other neck: No other acute soft tissue abnormality within the neck. No mass or adenopathy. Poor dentition noted. Upper chest: Visualized upper chest demonstrates no acute finding. Review of the MIP images confirms the above findings CTA HEAD FINDINGS Anterior circulation: Right ICA remains  occluded at the skull base. Distal reconstitution at the cavernous segment via collateral flow across the circle-of-Willis. Right M1 segment perfused to the right MCA bifurcation. There is downstream occlusion of a distal right M2/M3 branch, inferior division (series 510, image 181). Some attenuated flow seen distally, which could be related to subocclusive thrombus and/or collateralization. Remainder of the right MCA branches perfused. Petrous left ICA widely patent. Atheromatous change within the cavernous left ICA without significant stenosis. A1 segments patent bilaterally. Normal anterior communicating artery complex. Anterior cerebral arteries patent to their distal aspects without stenosis. Left M1 widely patent. Normal left MCA bifurcation. Distal left MCA branches well perfused. Posterior circulation: Both V4 segments patent to the vertebrobasilar junction without stenosis. Left vertebral slightly dominant. Both PICA origins patent and normal. Basilar patent to its distal aspect without stenosis. Superior cerebellar arteries patent bilaterally. Both PCA supplied via the basilar as well as small bilateral posterior communicating arteries. PCAs well perfused to their distal aspects. Venous sinuses: Grossly patent allowing for timing the contrast bolus. Anatomic variants: None significant.  No intracranial aneurysm. Review of the MIP images confirms the above findings CT Brain Perfusion Findings: ASPECTS: 10. CBF (<30%) Volume: 55m Perfusion (Tmax>6.0s) volume: 833mMismatch Volume: 6753mnfarction Location:Acute core infarct involving the posterior right MCA distribution at the right frontoparietal region. 67 cc surrounding penumbra. IMPRESSION: CTA HEAD AND NECK IMPRESSION: 1. Positive CTA for with abrupt occlusion of the proximal right ICA just beyond the right bifurcation. Distal reconstitution at the cavernous segment via collateral flow across the circle-of-Willis. Subsequent downstream occlusion at a  distal right M2/M3 branch, inferior division. 2. 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 3. Additional mild atheromatous change elsewhere about the major arterial vasculature of the head and neck. No other high-grade or correctable stenosis. 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 Critical Value/emergent results were called by telephone at the time of interpretation on 04/20/2020 at 12:30 am to provider [facility] Bradfordwho verbally acknowledged these results. Electronically Signed   By: BenJeannine BogaD.   On: 04/20/2020 01:24   CT ANGIO NECK CODE STROKE  Result Date: 04/20/2020 CLINICAL DATA:  Initial evaluation for acute stroke, left-sided weakness. EXAM: CT ANGIOGRAPHY HEAD AND NECK CT PERFUSION BRAIN TECHNIQUE:  Multidetector CT imaging of the head and neck was performed using the standard protocol during bolus administration of intravenous contrast. Multiplanar CT image reconstructions and MIPs were obtained to evaluate the vascular anatomy. Carotid stenosis measurements (when applicable) are obtained utilizing NASCET criteria, using the distal internal carotid diameter as the denominator. Multiphase CT imaging of the brain was performed following IV bolus contrast injection. Subsequent parametric perfusion maps were calculated using RAPID software. CONTRAST:  140m OMNIPAQUE IOHEXOL 350 MG/ML SOLN COMPARISON:  None. FINDINGS: CTA NECK FINDINGS Aortic arch: Visualized aortic arch normal in caliber with normal branch pattern. No hemodynamically significant stenosis about the origin of the great vessels. Visualized subclavian arteries widely patent. Right carotid system: Right CCA patent from its origin to the bifurcation without stenosis. There is abrupt occlusion of the proximal right ICA just beyond the right bifurcation. Right ICA remains  occluded within the neck. Left carotid system: Left CCA patent from its origin to the bifurcation without stenosis. There is severe near occlusive stenosis of the proximal left ICA just beyond the left bifurcation. A radiographic string sign is present. Stenosis begins just distal to the bifurcation, and measures approximately 2.1 cm in length (series 510, image 399). Left ICA otherwise patent distally to the skull base without stenosis, dissection, or occlusion. Vertebral arteries: Both vertebral arteries arise from the subclavian arteries. Vertebral arteries patent within the neck without stenosis, dissection or occlusion. Skeleton: Exaggeration of the normal thoracic kyphosis. No visible acute osseous finding. No discrete or worrisome osseous lesions. Other neck: No other acute soft tissue abnormality within the neck. No mass or adenopathy. Poor dentition noted. Upper chest: Visualized upper chest demonstrates no acute finding. Review of the MIP images confirms the above findings CTA HEAD FINDINGS Anterior circulation: Right ICA remains occluded at the skull base. Distal reconstitution at the cavernous segment via collateral flow across the circle-of-Willis. Right M1 segment perfused to the right MCA bifurcation. There is downstream occlusion of a distal right M2/M3 branch, inferior division (series 510, image 181). Some attenuated flow seen distally, which could be related to subocclusive thrombus and/or collateralization. Remainder of the right MCA branches perfused. Petrous left ICA widely patent. Atheromatous change within the cavernous left ICA without significant stenosis. A1 segments patent bilaterally. Normal anterior communicating artery complex. Anterior cerebral arteries patent to their distal aspects without stenosis. Left M1 widely patent. Normal left MCA bifurcation. Distal left MCA branches well perfused. Posterior circulation: Both V4 segments patent to the vertebrobasilar junction without  stenosis. Left vertebral slightly dominant. Both PICA origins patent and normal. Basilar patent to its distal aspect without stenosis. Superior cerebellar arteries patent bilaterally. Both PCA supplied via the basilar as well as small bilateral posterior communicating arteries. PCAs well perfused to their distal aspects. Venous sinuses: Grossly patent allowing for timing the contrast bolus. Anatomic variants: None significant.  No intracranial aneurysm. Review of the MIP images confirms the above findings CT Brain Perfusion Findings: ASPECTS: 10. CBF (<30%) Volume: 134mPerfusion (Tmax>6.0s) volume: 828mismatch Volume: 24m76mfarction Location:Acute core infarct involving the posterior right MCA distribution at the right frontoparietal region. 67 cc surrounding penumbra. IMPRESSION: CTA HEAD AND NECK IMPRESSION: 1. Positive CTA for with abrupt occlusion of the proximal right ICA just beyond the right bifurcation. Distal reconstitution at the cavernous segment via collateral flow across the circle-of-Willis. Subsequent downstream occlusion at a distal right M2/M3 branch, inferior division. 2. 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 3. Additional mild atheromatous  change elsewhere about the major arterial vasculature of the head and neck. No other high-grade or correctable stenosis. 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 Critical Value/emergent results were called by telephone at the time of interpretation on 04/20/2020 at 12:30 am to provider [facility] , who verbally acknowledged these results. Electronically Signed   By: Jeannine Boga M.D.   On: 04/20/2020 01:24   IR ANGIO EXTRACRAN SEL COM CAROTID INNOMINATE UNI L MOD SED  Result Date: 04/20/2020 INDICATION: 60 year old male with history acute right MCA syndrome, presents for attempt at  mechanical thrombectomy and treatment. EXAM: ULTRASOUND-GUIDED ACCESS RIGHT COMMON FEMORAL ARTERY RIGHT-SIDED CERVICAL AND CEREBRAL ANGIOGRAM LEFT-SIDED CERVICAL ANGIOGRAM BALLOON ANGIOPLASTY OCCLUDED RIGHT CERVICAL ICA MECHANICAL THROMBECTOMY RIGHT MCA M2 DIVISION ANGIO-SEAL FOR HEMOSTASIS COMPARISON:  CT imaging same day MEDICATIONS: 300 mg rectal aspirin ANESTHESIA/SEDATION: The anesthesia team was present to provide general endotracheal tube anesthesia and for patient monitoring during the procedure. Intubation was performed in negative pressure [place] in neuro IR holding. Left radial arterial line was performed by the anesthesia team. Interventional neuro radiology nursing staff was also present. CONTRAST:  100 cc IV contrast FLUOROSCOPY TIME:  Fluoroscopy Time: 18 minutes 12 seconds (889 mGy). COMPLICATIONS: SIR LEVEL B - Normal therapy, includes overnight admission for observation. Right-sided sylvian subarachnoid hemorrhage TECHNIQUE: Informed written consent was obtained from the patient's family after a thorough discussion of the procedural risks, benefits and alternatives. Specific risks discussed include: Bleeding, infection, contrast reaction, kidney injury/failure, need for further procedure/surgery, arterial injury or dissection, embolization to new territory, intracranial hemorrhage (10-15% risk), neurologic deterioration, cardiopulmonary collapse, death. All questions were addressed. Maximal Sterile Barrier Technique was utilized including during the procedure including caps, mask, sterile gowns, sterile gloves, sterile drape, hand hygiene and skin antiseptic. A timeout was performed prior to the initiation of the procedure. The anesthesia team was present to provide general endotracheal tube anesthesia and for patient monitoring during the procedure. Interventional neuro radiology nursing staff was also present. FINDINGS: Initial Findings: Right common carotid artery:  Normal course caliber and  contour. Right external carotid artery: Patent with antegrade flow. Right internal carotid artery: Right ICA occluded at the cervical ICA. Calcified plaque present at the bulb. Right MCA: After initial crossing and angioplasty of the cervical ICA, repeat angiogram was performed at the skull base. This demonstrated a transient M1 occlusion, which then again migrated distally into the inferior division. Initial perfusion of the targeted vessel is TICI 0: No perfusion or antegrade flow beyond site of occlusion Right ACA:  A 1 segment patent. Completion Findings: Right MCA: Patent. The targeted artery, inferior division, dominant artery to the parietal region, final score of TICI 3: Complete perfusion of the territory Flat panel CT demonstrates small volume subarachnoid hemorrhage in the right sylvian sulci. Final angiogram of the cervical ICA after balloon angioplasty demonstrates persisting stenosis, measured 42% by NASCET CT criteria. TICI 3 Flat panel CT Findings: Left common carotid artery:  Normal course caliber and contour. Left external carotid artery: Patent with antegrade flow. Left internal carotid artery: Irregular string sign at the proximal left cervical ICA, with high-grade stenosis. NASCET criteria measurement is 79% stenosis PROCEDURE: The anesthesia team was present to provide general endotracheal tube anesthesia and for patient monitoring during the procedure. Intubation was performed in negative pressure [place] in neuro IR holding. Interventional neuro radiology nursing staff was also present. Ultrasound survey of the right inguinal region was  performed with images stored and sent to PACs. 11 blade scalpel was used to make a small incision. Blunt dissection was performed with US guidance. A micropuncture needle was used access the right common femoral artery under ultrasound. With excellent arterial blood flow returned, an .018 micro wire was passed through the needle, observed to enter the abdominal  aorta under fluoroscopy. The needle was removed, and a micropuncture sheath was placed over the wire. The inner dilator and wire were removed, and an 035 wire was advanced under fluoroscopy into the abdominal aorta. The sheath was removed and a 25cm 21F straight vascular sheath was placed. The dilator was removed and the sheath was flushed. Sheath was attached to pressurized and heparinized saline bag for constant forward flow. 125 cm parents stent diagnostic catheter was used to select the innominate artery. The catheter was advanced into the common carotid artery using a roadrunner diagnostic wire. Wire was removed and angiogram was performed in the distal common carotid artery. The catheter was then directed towards the external carotid artery and a rose in wire was placed. A 95cm 087 "Walrus" balloon guide was then advanced on the Rose an wire. Once the balloon guide was position in the distal cervical ICA, rose in wire was removed. Baseline angiogram was again performed with roadmap achieved. A rapid transit microcatheter with a coaxial synchro soft wire was used to probe and cross the proximal cervical ICA occlusion. The wire was advanced distally with the rapid transit catheter, to the distal cervical ICA. Wire was removed and initial angiogram was performed. This proved a luminal position. A exchange length, 300 cm transcend wire with a floppy tip was then placed and the rapid transit was removed. Standard balloon angioplasty with a 4 mm by 40 mm Viatrac was performed at the carotid bulb. The balloon was inflated to 10.2 atmosphere. The balloon was deflated and the balloon guide was then advanced on the wire and the balloon shaft. A distal position of the cervical ICA was achieved. The balloon and the transcend wire were removed. Adequate flush was achieved at the balloon guide and then a new diagnostic angiogram was performed for road mapping of the intracranial occlusion. We identified the target as  inferior division, M2, with a dominant perfusion to the parietal lobe. Copious back flush was performed and the balloon catheter was attached to heparinized and pressurized saline bag for forward flow. A second coaxial system was then advanced through the balloon catheter, which included the selected intermediate catheter, microcatheter, and microwire. In this scenario, the set up included a zoom 55 intermediate catheter, a Trevo Provue18 microcatheter, and 014 synchro soft wire. This system was advanced through the balloon guide catheter under the road-map function, with adequate back-flush at the rotating hemostatic valve at that back end of the balloon guide. Microcatheter and the intermediate catheter system were advanced through the terminal ICA and MCA to the level of the inferior division M2 occlusion. The micro wire was then carefully advanced through the occluded segment. Microcatheter was then manipulated through the occluded segment and the wire was removed with saline drip at the hub. Blood was then aspirated through the hub of the microcatheter, and a gentle contrast injection was performed confirming intraluminal position. A rotating hemostatic valve was then attached to the back end of the microcatheter, and a pressurized and heparinized saline bag was attached to the catheter. 3 x 20 solitaire device was then selected. Back flush was achieved at the rotating hemostatic valve, and then  the device was gently advanced through the microcatheter to the distal end. The retriever was then unsheathed by withdrawing the microcatheter under fluoroscopy. Once the retriever was completely unsheathed, the microcatheter was carefully stripped from the delivery device. Control angiogram was performed from the intermediate catheter. A 3 minute time interval was observed. The balloon at the balloon guide catheter was then inflated under fluoroscopy for proximal flow arrest. Constant aspiration using the proprietary  engine was then performed at the intermediate catheter, as the retriever was gently and slowly withdrawn with fluoroscopic observation. Once the retriever was "corked" within the tip of the intermediate catheter, both were removed from the system. Free aspiration was confirmed at the hub of the balloon guide catheter, with free blood return confirmed. The balloon was then deflated, and a control angiogram was performed. Persisting occlusion of the inferior division was observed. We elected to perform a second pass. Second pass: The same coaxial system was advanced using a zoom 55 intermediate catheter, a Trevo Provue18 microcatheter, and 014 synchro soft wire. This system was advanced through the balloon guide catheter under the road-map function, with adequate back-flush at the rotating hemostatic valve at that back end of the balloon guide. Microcatheter and the intermediate catheter system were advanced through the terminal ICA and MCA to the level of the inferior division M2 occlusion. The micro wire and catheter were then removed. A direct aspiration strategy was then used with the zoom 55 catheter. The proprietary engine was turned on and the catheter was advanced slowly until there was no longer aspiration of blood products. Approximately 20 seconds-30 seconds was observed and the catheter was removed. Adequate aspiration was then performed at the hub of the balloon guide and another angiogram was performed. Restoration of flow was confirmed. Transcend wire was then advanced through the balloon guide and the balloon guide was withdrawn to the common carotid artery. Angiogram of the cervical ICA was performed. Flat panel CT was performed. After the imaging was performed of the head and the angiogram of the cervical ICA was reviewed, results were discussed with the neurology team. At this point, given the estimated 40% narrowing of the cervical ICA and the subarachnoid hemorrhage, we elected to terminate the  case for observation, and surveillance of the carotid disease. The balloon guide and the wire were removed. A left-sided cervical angiogram was then performed using a JB 1 catheter. Catheter was removed. The skin at the puncture site was then cleaned with Chlorhexidine. The 8 French sheath was removed and an 43F angioseal was deployed. Patient was extubated. Patient tolerated the procedure well and remained hemodynamically stable throughout. No complications were encountered and no significant blood loss encountered. IMPRESSION: Status post ultrasound guided access right common femoral artery for bilateral cervical/cerebral angiogram, and treatment of right-sided cervical ICA/MCA tandem occlusion, treating the cervical ICA occlusion with 4 mm balloon angioplasty, and restoring TICI 3 perfusion to the inferior division M2 branch with 2 passes of combination stent retriever and direct aspiration. Angio-Seal for closure. Signed, Dulcy Fanny. Dellia Nims, [facility] Radiology PLAN: Patient extubated. ICU status Target systolic blood pressure of 120-140 Right hip straight time 6 hours Frequent neurovascular checks Repeat neurologic imaging with CT and/MRI at the discretion of neurology team Continue aspirin antiplatelets Carotid duplex for carotid surveillance Electronically Signed   By: Corrie Mckusick D.O.   On: 04/20/2020 05:29   PHYSICAL EXAM   Middle aged male lying on bed feeding self applesauce with right  hand. Tearful and anxious.  Constitutional: Calm, appropriate for condition  Cardiovascular: Normal RR Respiratory: No increased WOB    Mental status: Oriented to person, place,situation.  No aphasia apraxia or dysarthria.  Follows commands well. Intact attention, registration and recall Speech: Fluent with naming intact. Cranial nerves: Right gaze preference but able to look to the left of midline.  Decreased blink to threat on the left compared to the  right., Left lower facial weakness, tongue midline, diminished shoulder shrug on the left  Motor: Normal bulk and tone. Left sided hemiplegia with 1/5 left upper extremity and 2/5 left lower extremity strength.  Right hemibody full strength.   Sensory: Decreased sensation to light touch on the left. Left sided extinction noted with bilateral tactile stimulation to BLE not noted in BUE.  Coordination:  Gait: Deferred    ASSESSMENT/PLAN Avin Bonton is a 60 y.o. 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 CTH with ASPECTS of 10. ATS with R M2/3 occlusion, R ICA occlusion with distal intracranial reconstitution, L ICA string sign at the bifurcation. Outside tPA at arrival. Taken to IR for thrombectomy.  Stroke:  Right MCA stroke with right M2/3 occlusion s/p emergent cerebral angiogram with  successful thrombectomy achieving a TICI 3 revascularization.  Small volume SAH in the sylvian sulci at completion.   CT Head without contrast: CTH was negative for a large hypodensity concerning for a large territory infarct or hyperdensity concerning for an ICH. ASPECTs of 10.  CT angio Head and Neck with contrast: R M2/3 thrombus R ICA occlusion with distal intracranial reconstitution L ICA string sign at the bifurcation.  MRI Brain - pending  2D Echo  -Pending  LDL 98 HgbA1c  Lab Results  Component Value Date   HGBA1C 7.3 (H) 04/20/2020     VTE prophylaxis - consider 3/19    Swallow status, NPO pending formal speech eval.    Diet   Diet NPO time specified   On ASA 81 mg and Plavix '75mg'$  daily PTA  Plan likely need aspirin and Plavix following follow-up imaging with no significant hemoorhage  Therapy recommendations:  TBD  Disposition:  TBD  Hypertension  Home meds:       Stable . Permissive hypertension (OK if < 220/120) but gradually normalize in 5-7 days . Long-term BP goal normotensive  Hyperlipidemia  Home meds:  Atorvastatin '40mg'$  daily  LDL 98, goal < 70  High intensity statin: continue home atorvastatin  Continue statin at discharge  Diabetes type II Uncontrolled  Home meds:    HgbA1c 7.3 not quite at goal < 7.0  CBGs Recent Labs    04/20/20 0012  GLUCAP 208*      SSI  s/p cerebral arteriogram with emergent mechanical thrombectomy right ICA bifurcation occlusion and right MCA M2 occlusion achieving a TICI 3 revascularization via right femoral approach 04/20/2020 by Dr. Earleen Newport. -recommend possible revascularization this admission at discretion of neurology (please consult NIR if this is desired during this admission). -NIR following  Other Stroke Risk Factors Advanced Age >/= 19  Hx stroke/TIA   Other Active Problems     Hospital day # 0 I have personally obtained history,examined this patient, reviewed notes, independently viewed imaging studies, participated in medical decision making and plan of care.ROS completed by me  personally and pertinent positives fully documented  I have made any additions or clarifications directly to the above note. Agree with note above.  Patient presented with right M2 occlusion from proximal right ICA stenosis underwent successful right M2 thrombectomy with right proximal ICA angioplasty.  Panel CT chest shows possible small subarachnoid hemorrhage.  Continue close neurological monitoring with strict blood pressure control with systolic goal below [id number] for the post 24 hours.  Check MRI as an of the brain later.  Continue close neurological monitoring as per post IR protocol.  Discussed with patient RN and answered questions. This patient is critically ill and at significant risk of neurological worsening, death and care requires constant monitoring of vital signs, hemodynamics,respiratory and  cardiac monitoring, extensive review of multiple databases, frequent neurological assessment, discussion with family, other specialists and medical decision making of high complexity.I have made any additions or clarifications directly to the above note.This critical care time does not reflect procedure time, or teaching time or supervisory time of PA/NP/Med Resident etc but could involve care discussion time.  I spent 35 minutes of neurocritical care time  in the care of  this patient.      Antony Contras, MD Medical Director [facility] Pager: [phone] 04/20/2020 4:54 PM   To contact Stroke Continuity provider, please refer to [email or web address] After hours, contact General Neurology

## 2020-04-20 NOTE — Evaluation (Signed)
Speech Language Pathology Evaluation Patient Details Name: Bobby Branch MRN: [id number] DOB: 06/26/1960 Today's Date: 04/20/2020 Time: [id number] SLP Time Calculation (min) (ACUTE ONLY): 21 min  Problem List:  Patient Active Problem List   Diagnosis Date Noted  . Acute right MCA stroke ([place]) 04/20/2020   Past Medical History:  Past Medical History:  Diagnosis Date  . Stroke [facility]) 09/2019   Past Surgical History:  Past Surgical History:  Procedure Laterality Date  . IR ANGIO EXTRACRAN SEL COM CAROTID INNOMINATE UNI L MOD SED  04/20/2020  . IR CT HEAD LTD  04/20/2020  . IR PERCUTANEOUS ART THROMBECTOMY/INFUSION INTRACRANIAL INC DIAG ANGIO  04/20/2020  . IR US GUIDE VASC ACCESS RIGHT  04/20/2020   HPI:  Pt is a 60 y.o. male with PMH significant for prior stroke in august 2021 with very mild residual deficit after finishing rehab. Pt presented with acute onset left-sided weakness. CT head 3/18: Asymmetric hyperdensity involving right M2/M3 branches, concerning for thrombus. CTA 3/18: abrupt occlusion of the proximal right ICA just beyond the right bifurcation. 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 Pt s/p cerebral arteriogram with emergent mechanical thrombectomy of right ICA bifurcation occlusion and right MCA M2 occlusion and revascularization on 3/18. Pt passed the [facility] swallow screen on 3/18 and swallow evaluation was subsequently ordered due to anterior spillage of puree as noted by provider.   Assessment / Plan / Recommendation Clinical Impression  Pt participated in speech/language/cognition evaluation with his significant other, Chastity, present for the evaluation. Pt reported dysarthria at baseline and stated that he received SLP services for this in [place] following the last CVA. Pt stated that he was given a HEP for dysarthria, but that he has been noncompliant with this. Pt stated that his speech and language skills are  currently at baseline and he denied any acute changes in cognitive-linguistic skills. Pt reported that he has a ninth-grade education, but has a GED. 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 [facility] Mental Status Examination was completed to evaluate the pt's cognitive-linguistic skills. He achieved a score of 23/30 which is below the normal limits of 27 or more out of 30 and is suggestive of a mild impairment. He exhibited difficulty in the areas of awareness, attention, memory, and executive function. He also presented with mild to moderate dysarthria characterized by reduced articulatory precision, reduced respiratory support, and a harsh vocal quality. Speech intelligibility was negatively impacted at the conversational level, but this is reported to be his baseline per the pt and his significant other. Skilled SLP services are clinically indicated at this time to improve cognitive-linguistic function.    SLP Assessment  SLP Recommendation/Assessment: Patient needs continued Speech Lanaguage Pathology Services SLP Visit Diagnosis: Cognitive communication deficit (R41.841)    Follow Up Recommendations   (TBD)    Frequency and Duration min 2x/week  2 weeks      SLP Evaluation Cognition  Overall Cognitive Status: Impaired/Different from baseline Arousal/Alertness: Awake/alert Orientation Level: Oriented X4 Attention: Focused;Sustained Focused Attention: Impaired Focused Attention Impairment: Verbal complex Sustained Attention: Impaired Sustained Attention Impairment: Verbal complex Memory: Impaired Memory Impairment: Retrieval deficit;Decreased recall of new information (Immediate: 5/5; delayed: 4/5; with cue: 1/1) Awareness: Impaired Awareness Impairment: Emergent impairment Problem Solving: Appears intact Executive Function: Sequencing;Organizing Sequencing: Impaired Sequencing Impairment: Verbal complex (Clock  drawing: 0/4) Organizing: Impaired Organizing Impairment: Verbal complex (Backward digit span: 1/2 with  self-correction)       Comprehension  Auditory Comprehension Overall Auditory Comprehension: Appears within functional limits for tasks assessed Yes/No Questions: Within Functional Limits Commands: Within Functional Limits Conversation: Complex    Expression Expression Primary Mode of Expression: Verbal Verbal Expression Overall Verbal Expression: Appears within functional limits for tasks assessed Initiation: No impairment Naming: No impairment Pragmatics: Impairment Impairments: Topic maintenance Interfering Components: Attention   Oral / Motor  Oral Motor/Sensory Function Overall Oral Motor/Sensory Function: Mild impairment Facial ROM: Suspected CN VII (facial) dysfunction;Reduced left Facial Symmetry: Abnormal symmetry left;Suspected CN VII (facial) dysfunction Facial Strength: Reduced left;Suspected CN VII (facial) dysfunction Facial Sensation: Within Functional Limits Lingual ROM: Reduced left;Suspected CN XII (hypoglossal) dysfunction Lingual Symmetry: Abnormal symmetry left;Suspected CN XII (hypoglossal) dysfunction Lingual Strength: Reduced;Suspected CN XII (hypoglossal) dysfunction Lingual Sensation: Within Functional Limits Velum: Within Functional Limits Mandible: Within Functional Limits Motor Speech Overall Motor Speech: Impaired at baseline Respiration: Impaired Level of Impairment: Conversation Phonation: Hoarse (rough vocal quality) Resonance: Within functional limits Articulation: Impaired Level of Impairment: Conversation Intelligibility: Intelligibility reduced Word: 75-100% accurate Phrase: 75-100% accurate Sentence: 75-100% accurate Conversation: 75-100% accurate Motor Planning: Witnin functional limits Motor Speech Errors: Aware;Consistent   Shanika I. Hardin Negus, [place], [place] Office number [phone] Pager  [place] 04/20/2020, 5:01 PM

## 2020-04-20 NOTE — ED Notes (Signed)
NIH completed 0010

## 2020-04-20 NOTE — Progress Notes (Signed)
Okay to remove  NGT per Dr. Earleen Newport.

## 2020-04-20 NOTE — ED Provider Notes (Signed)
[facility] EMERGENCY DEPARTMENT Provider Note  CSN: [id number] Arrival date & time: 04/20/20 0009  Chief Complaint(s) Code Stroke  HPI Bobby Branch is a 60 y.o. male who presented by EMS as a code stroke for left-sided weakness and facial droop.  Last known normal 1830 p.m. patient has a history of prior strokes.  Not currently on blood thinners.  Remainder of history, ROS, and physical exam limited due to patient's condition (acuity of condition). Additional information was obtained from EMS.   Level V Caveat.    HPI  Past Medical History No past medical history on file. There are no problems to display for this patient.  Home Medication(s) Prior to Admission medications   Not on File                                                                                                                                    Past Surgical History ** The histories are not reviewed yet. Please review them in the "History" navigator section and refresh this [place]. Family History No family history on file.  Social History   Allergies Patient has no allergy information on record.  Review of Systems Review of Systems  Unable to perform ROS: Acuity of condition    Physical Exam Vital Signs  I have reviewed the triage vital signs BP (!) 145/81 (BP Location: Right Arm)   Pulse 98   Temp (!) 97.3 F (36.3 C) (Temporal)   Resp 15   SpO2 98%   Physical Exam Vitals reviewed.  Constitutional:      General: He is not in acute distress.    Appearance: He is well-developed. He is not diaphoretic.  HENT:     Head: Normocephalic and atraumatic.     Nose: Nose normal.  Eyes:     General: No scleral icterus.       Right eye: No discharge.        Left eye: No discharge.     Conjunctiva/sclera: Conjunctivae normal.     Pupils: Pupils are equal, round, and reactive to light.  Cardiovascular:     Rate and Rhythm: Normal rate and regular rhythm.     Heart sounds:  No murmur heard. No friction rub. No gallop.   Pulmonary:     Effort: Pulmonary effort is normal. No respiratory distress.     Breath sounds: Normal breath sounds. No stridor. No rales.  Abdominal:     General: There is no distension.     Palpations: Abdomen is soft.     Tenderness: There is no abdominal tenderness.  Musculoskeletal:        General: No tenderness.     Cervical back: Normal range of motion and neck supple.  Skin:    General: Skin is warm and dry.     Findings: No erythema or rash.  Neurological:     Mental Status: He is alert and oriented  to person, place, and time.     Comments: Patient appears to have left-sided neglect. Left facial droop with left-sided arm and leg weakness. Detailed neuro exam by neurology.      ED Results and Treatments Labs (all labs ordered are listed, but only abnormal results are displayed) Labs Reviewed  I-STAT CHEM 8, ED - Abnormal; Notable for the following components:      Result Value   Creatinine, Ser 1.90 (*)    Glucose, Bld 227 (*)    Calcium, Ion 1.05 (*)    All other components within normal limits  CBG MONITORING, ED - Abnormal; Notable for the following components:   Glucose-Capillary 208 (*)    All other components within normal limits  RESP PANEL BY RT-PCR (FLU A&B, COVID) ARPGX2  CBC  DIFFERENTIAL  ETHANOL  PROTIME-INR  APTT  COMPREHENSIVE METABOLIC PANEL  RAPID URINE DRUG SCREEN, HOSP PERFORMED  URINALYSIS, ROUTINE W REFLEX MICROSCOPIC                                                                                                                         EKG  EKG Interpretation  Date/Time:    Ventricular Rate:    PR Interval:    QRS Duration:   QT Interval:    QTC Calculation:   R Axis:     Text Interpretation:        Radiology No results found.  Pertinent labs & imaging results that were available during my care of the patient were reviewed by me and considered in my medical decision making (see  chart for details).  Medications Ordered in ED Medications  iohexol (OMNIPAQUE) 350 MG/ML injection 100 mL (100 mLs Intravenous Contrast Given 04/20/20 0035)                                                                                                                                    Procedures Procedures  (including critical care time)  Medical Decision Making / ED Course I have reviewed the nursing notes for this encounter and the patient's prior records (if available in EHR or on provided paperwork).   Bobby Branch was evaluated in Emergency Department on 04/20/2020 for the symptoms described in the history of present illness. He was evaluated in the context of the global COVID-19 pandemic, which necessitated consideration that the patient might be at risk for infection with the SARS-CoV-2 virus  that causes COVID-19. Institutional protocols and algorithms that pertain to the evaluation of patients at risk for COVID-19 are in a state of rapid change based on information released by regulatory bodies including the CDC and federal and state organizations. These policies and algorithms were followed during the patient's care in the ED.  Code stroke out of the window for TPA but patient does have evidence of LVO. Taken to CT and CTA notable for right M2/M3 occlusion. Neurology to admit for possible IR thrombectomy.      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  Despite best efforts to proofread,  errors can occur which can change the documentation meaning.   Fatima Blank, MD 04/20/20 [phone]

## 2020-04-20 NOTE — Progress Notes (Signed)
PT Cancellation Note  Patient Details Name: Bobby Branch MRN: [id number] DOB: 10/02/1960   Cancelled Treatment:    Reason Eval/Treat Not Completed: Medical issues which prohibited therapy;Active bedrest order Pt with current bedrest orders. Will follow up as activity orders updated and as schedule allows.   Bobby Branch, DPT  Acute Rehabilitation Services  Pager: [phone] Office: [phone]    Rudean Hitt 04/20/2020, 9:33 AM

## 2020-04-20 NOTE — Procedures (Signed)
Neuro-Interventional Radiology  Post Cerebral Angiogram Procedure Note  Operator:    Dr. Earleen Newport Assistant:   None  History:   Bobby Branch is a 60 y.o. male with PMH significant for prior stroke in august 2021 with very mild residual deficit after finishing rehab who presents with acute onset Left sided weakness with extinction and a LKW of 1830.  Patient was dropped off at his apartment at [place] and was found down by family and [employer] with left sided weakness. He was brought in as a stroke code.  NIHSS:   14 Site of occlusion:  Tandem occlusion, right ICA bifurcation, right MCA/M2   Procedure: US guided Right CFA access Cervical & Cerebral Angiogram Right ICA occlusion PTA, 48m x 477mMechanical Thrombectomy, right MCA/M2 Deployment of Angioseal Flat Panel CT in NIR  First Pass Device:  Solitaire 36m42m 32m536mZoom 55 local aspiration  Result   TICI 2a  Second Pass Device: Direct aspiration, Zoom 55  Result   TICI 3  Findings:     Initial --> Occlusion of the right ICA at the bifurcation. Right inferior division (dominant to parietal) M2 occlusion,   Final --> M2 open and flowing after 2 passes, TICI 3 final  Small volume SAH in the sylvian sulci at completion.  No stain parenchymal hemorrhage.   Right ICA stenosis measured 42% by NASCET at completion after the PTA.  Thus, we elected not to stent at this time, secondary to the SA[facility], with irregular string sign.   DP and PT pulses are strong doppler at start and finish  Anesthesia:   GETA  EBL:    ~[id number] Complication:  None   Medication: IV tPA administered?: no IA Medication:  No '300mg'$  PR ASA at initiation of case  Recommendations: - right hip straight x 6 hours.  May sit up if needed after 6 hours.  May use reverse trendelenburg as needed.  - Goal SBP 120-140.   - Frequent NV checks - Repeat CT or MRI imaging recommended within 36 hours, discretion of Neurology - Continue daily aspirin,  with consideration of adding second platelet  - NIR to follow.   - Carotid duplex recommended.  If early right restenosis will discuss revascularization  Signed,  JaimDulcy FannygnEarleen Newport

## 2020-04-20 NOTE — Anesthesia Procedure Notes (Signed)
Procedure Name: Intubation Date/Time: 04/20/2020 1:15 AM Performed by: Jearld Pies, CRNA Pre-anesthesia Checklist: Patient identified, Emergency Drugs available, Suction available and Patient being monitored Patient Re-evaluated:Patient Re-evaluated prior to induction Oxygen Delivery Method: Circle System Utilized Preoxygenation: Pre-oxygenation with 100% oxygen Induction Type: IV induction Laryngoscope Size: Glidescope and 4 Grade View: Grade I Tube type: Oral Tube size: 7.5 mm Number of attempts: 1 Airway Equipment and Method: Stylet and Video-laryngoscopy Placement Confirmation: ETT inserted through vocal cords under direct vision,  breath sounds checked- equal and bilateral and CO2 detector Secured at: 22 cm Tube secured with: Tape Dental Injury: Teeth and Oropharynx as per pre-operative assessment  Comments: Glidescope utilized d/t unknown COVID status.

## 2020-04-20 NOTE — Progress Notes (Signed)
Dr Theador Hawthorne made aware of glucose 316, staes will write orders for coverage

## 2020-04-20 NOTE — Consult Note (Signed)
NEUROLOGY CONSULTATION NOTE   Date of service: April 20, 2020 Patient Name: Bobby Branch MRN:  [id number] DOB:  [birth year] Reason for consult: "Stroke code" _ _ _   _ __   _ __ _ _  __ __   _ __   __ _  History of Present Illness  Bobby Branch is a 60 y.o. male with PMH significant for prior stroke in august 2021 with very mild residual deficit after finishing rehab who presents with acute onset Left sided weakness with extinction and a LKW of 1830.  Patient was dropped off at his apartment at [place] and was found down by family and [employer] with left sided weakness. He was brought in as a stroke code.  CTH w/o contrast was negative for a large hypodensity concerning for a large territory infarct or hyperdensity concerning for an ICH, CT angio with a R MCA M2/M3 occlusion, R ICA occlusion proximally with reconstitution intracranially and L ICA string sign at bifurcation. CT Perfusion demonstrated a core of 51m with a mismatch of 623m  NIHSS: 14 MRS: 0 TPA: outside window at presentation Thrombectomy: Dr. WaEarleen Newportiscussed risks and benefits with patient's daughter Ms. Bobby Branch the phone who consented to proceed with thrombectomy.   ROS   Unable to obtain given the acuity of the situation.  Past History   Past Medical History:  [facility]osis [facility]  [facility]   No family history on file. Social History   Socioeconomic History  . Marital status: Single    Spouse name: Not on file  . Number of children: Not on file  . Years of education: Not on file  . Highest education level: Not on file  Occupational History  . Not on file  Tobacco Use  . Smoking status: Not on file  . Smokeless tobacco: Not on file  Substance and Sexual Activity  . Alcohol use: Not on file  . Drug use: Not on file  . Sexual activity: Not on file  Other Topics Concern  . Not on file  Social History Narrative  . Not on file   Social Determinants of Health   Financial Resource Strain: Not  on file  Food Insecurity: Not on file  Transportation Needs: Not on file  Physical Activity: Not on file  Stress: Not on file  Social Connections: Not on file   No Known Allergies  Medications  (Not in a hospital admission)    Vitals   Vitals:   04/20/20 0045 04/20/20 0052  BP: (!) 207/98 (!) 191/106  Pulse: 98 97  Resp: 17 18  Temp:  98.7 F (37.1 C)  TempSrc:  Oral  SpO2: 100% 100%     There is no height or weight on file to calculate BMI.  Physical Exam   General: Laying comfortably in bed; in no acute distress. HENT: Normal oropharynx and mucosa. Normal external appearance of ears and nose. Neck: Supple, no pain or tenderness CV: No JVD. No peripheral edema. Pulmonary: Symmetric Chest rise. Normal respiratory effort. Abdomen: Soft to touch, non-tender. Ext: No cyanosis, edema, or deformity Skin: No rash. Normal palpation of skin.  Musculoskeletal: Normal digits and nails by inspection. No clubbing.  Neurologic Examination  Mental status/Cognition: Alert, oriented to self, place, month and year, good attention. Speech/language: Fluent, comprehension intact, object naming intact, repetition intact. Cranial nerves:   CN II Pupils equal and reactive to light, no blink to threat on the left   CN III,IV,VI R gaze deviation.  CN V    CN VII Left facial droop   CN VIII normal hearing to speech   CN IX & X    CN XI    CN XII midline tongue protrusion   Motor:  Muscle bulk: poor, tone hypotonic on the left, tremor none Mvmt Root Nerve  Muscle Right Left Comments  SA C5/6 Ax Deltoid 5 1   EF C5/6 Mc Biceps 5 0   EE C6/7/8 Rad Triceps 5 0   WF C6/7 Med FCR 5    WE C7/8 PIN ECU 5    F Ab C8/T1 U ADM/FDI 5 0   HF L1/2/3 Fem Illopsoas 5 0   KE L2/3/4 Fem Quad     DF L4/5 D Peron Tib Ant 5 0   PF S1/2 Tibial Grc/Sol 5 0    Reflexes:  Right Left Comments  Pectoralis      Biceps (C5/6) 2 2   Brachioradialis (C5/6) 2 2    Triceps (C6/7) 2 2    Patellar  (L3/4) 2 2    Achilles (S1)      Hoffman      Plantar     Jaw jerk    Sensation:  Light touch Decreased in LUE and LLE   Pin prick    Temperature    Vibration   Proprioception    Coordination/Complex Motor:  - Finger to Nose intact on the right - Heel to shin intact on the right. - Rapid alternating movement are normal on the right. - Gait: Deferred.  Labs   CBC:  Recent Labs  Lab 04/20/20 0011 04/20/20 0022  WBC 7.2  --   NEUTROABS 4.8  --   HGB 13.6 13.6  HCT 41.1 40.0  MCV 94.9  --   PLT 338  --     Basic Metabolic Panel:  Lab Results  Component Value Date   NA 139 04/20/2020   K 3.9 04/20/2020   CO2 26 04/20/2020   GLUCOSE 227 (H) 04/20/2020   BUN 19 04/20/2020   CREATININE 1.90 (H) 04/20/2020   CALCIUM 8.6 (L) 04/20/2020   GFRNONAA 36 (L) 04/20/2020   Lipid Panel: No results found for: LDLCALC HgbA1c: No results found for: HGBA1C Urine Drug Screen: No results found for: LABOPIA, COCAINSCRNUR, LABBENZ, AMPHETMU, THCU, LABBARB  Alcohol Level     Component Value Date/Time   ETH <10 04/20/2020 0011    CT Head without contrast: CTH was negative for a large hypodensity concerning for a large territory infarct or hyperdensity concerning for an ICH. ASPECTs of 10.  CT angio Head and Neck with contrast: R M2/3 thrombus R ICA occlusion with distal intracranial reconstitution L ICA string sign at the bifurcation.  MRI Brain pending  Impression   Bobby Branch is a 60 y.o. 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 CTH with ASPECTS of 10. ATS with R M2/3 occlusion, R ICA occlusion with distal intracranial reconstitution, L ICA string sign at the bifurcation. Outside tPA at arrival. Taken to IR for thrombectomy.  Recommendations  Plan:  - Admit  to ICU after IR. - Frequent Neuro checks per stroke unit protocol - Brain imaging with MRI Brain without contrast is pending. - TTE with bubble study is pending. - Recommend obtaining Lipid panel  with LDL - Please start statin if LDL > 70 - Recommend HbA1c - Antithrombotic - Aspirin suppository '300mg'$  given prior to thrombectomy. - Recommend DVT ppx - SBP goal - per IR after thrombectomy. - Recommend Telemetry monitoring for arrythmia - Recommend bedside swallow screen prior to PO intake. - Stroke education booklet - Recommend PT/OT/SLP consult ______________________________________________________________________  This patient is critically ill and at significant risk of neurological worsening, death and care requires constant monitoring of vital signs, hemodynamics,respiratory and cardiac monitoring, neurological assessment, discussion with family, other specialists and medical decision making of high complexity. I spent 60 minutes of neurocritical care time  in the care of  this patient. This was time spent independent of any time provided by nurse practitioner or PA.  Donnetta Simpers [facility] Pager Number [phone] 04/20/2020  1:48 AM  Thank you for the opportunity to take part in the care of this patient. If you have any further questions, please contact the neurology consultation attending.  Signed,  [place] Pager Number [phone] _ _ _   _ __   _ __ _ _  __ __   _ __   __ _

## 2020-04-21 ENCOUNTER — Inpatient Hospital Stay (HOSPITAL_COMMUNITY): Payer: Medicaid Other

## 2020-04-21 ENCOUNTER — Encounter (HOSPITAL_COMMUNITY): Payer: Self-pay

## 2020-04-21 DIAGNOSIS — I6523 Occlusion and stenosis of bilateral carotid arteries: Secondary | ICD-10-CM

## 2020-04-21 DIAGNOSIS — I63511 Cerebral infarction due to unspecified occlusion or stenosis of right middle cerebral artery: Secondary | ICD-10-CM

## 2020-04-21 LAB — BASIC METABOLIC PANEL
Anion gap: 6 (ref 5–15)
BUN: 19 mg/dL (ref 6–20)
CO2: 23 mmol/L (ref 22–32)
Calcium: 7.9 mg/dL — ABNORMAL LOW (ref 8.9–10.3)
Chloride: 105 mmol/L (ref 98–111)
Creatinine, Ser: 2.49 mg/dL — ABNORMAL HIGH (ref 0.61–1.24)
GFR, Estimated: 29 mL/min — ABNORMAL LOW (ref >60)
Glucose, Bld: 141 mg/dL — ABNORMAL HIGH (ref 70–99)
Potassium: 3.4 mmol/L — ABNORMAL LOW (ref 3.5–5.1)
Sodium: 134 mmol/L — ABNORMAL LOW (ref 135–145)

## 2020-04-21 LAB — GLUCOSE, CAPILLARY
Glucose-Capillary: 139 mg/dL — ABNORMAL HIGH (ref 70–99)
Glucose-Capillary: 142 mg/dL — ABNORMAL HIGH (ref 70–99)
Glucose-Capillary: 145 mg/dL — ABNORMAL HIGH (ref 70–99)
Glucose-Capillary: 106 mg/dL — ABNORMAL HIGH (ref 70–99)
Glucose-Capillary: 137 mg/dL — ABNORMAL HIGH (ref 70–99)
Glucose-Capillary: 103 mg/dL — ABNORMAL HIGH (ref 70–99)

## 2020-04-21 LAB — CBC
HCT: 37.5 % — ABNORMAL LOW (ref 39.0–52.0)
Hemoglobin: 13.1 g/dL (ref 13.0–17.0)
MCH: 32.1 pg (ref 26.0–34.0)
MCHC: 34.9 g/dL (ref 30.0–36.0)
MCV: 91.9 fL (ref 80.0–100.0)
Platelets: 364 10*3/uL (ref 150–400)
RBC: 4.08 MIL/uL — ABNORMAL LOW (ref 4.22–5.81)
RDW: 13.5 % (ref 11.5–15.5)
WBC: 13.9 10*3/uL — ABNORMAL HIGH (ref 4.0–10.5)
nRBC: 0 % (ref 0.0–0.2)

## 2020-04-21 LAB — TSH: TSH: 2.164 u[IU]/mL (ref 0.350–4.500)

## 2020-04-21 MED ORDER — ORAL CARE MOUTH RINSE
15.0000 mL | Freq: Two times a day (BID) | OROMUCOSAL | Status: DC
  Administered 2020-04-21 – 2020-04-23 (×4): 15 mL via OROMUCOSAL

## 2020-04-21 MED ORDER — ALUM & MAG HYDROXIDE-SIMETH 200-200-20 MG/5ML PO SUSP
30.0000 mL | ORAL | Status: DC | PRN
  Administered 2020-04-21: 30 mL via ORAL

## 2020-04-21 MED ORDER — ATORVASTATIN CALCIUM 40 MG PO TABS
40.0000 mg | ORAL_TABLET | Freq: Every day | ORAL | Status: DC
  Administered 2020-04-21 – 2020-04-22 (×2): 40 mg via ORAL
  Filled 2020-04-21 (×2): qty 1

## 2020-04-21 MED ORDER — ESCITALOPRAM OXALATE 10 MG PO TABS
10.0000 mg | ORAL_TABLET | Freq: Every day | ORAL | Status: DC
  Administered 2020-04-21 – 2020-04-23 (×3): 10 mg via ORAL
  Filled 2020-04-21 (×3): qty 1

## 2020-04-21 MED ORDER — ALUM & MAG HYDROXIDE-SIMETH 200-200-20 MG/5ML PO SUSP
15.0000 mL | ORAL | Status: DC | PRN
  Filled 2020-04-21: qty 30

## 2020-04-21 MED ORDER — CLOPIDOGREL BISULFATE 75 MG PO TABS
75.0000 mg | ORAL_TABLET | Freq: Every day | ORAL | Status: DC
  Administered 2020-04-21 – 2020-04-23 (×3): 75 mg via ORAL
  Filled 2020-04-21 (×3): qty 1

## 2020-04-21 MED ORDER — FAMOTIDINE 20 MG PO TABS
20.0000 mg | ORAL_TABLET | Freq: Every day | ORAL | Status: DC
  Administered 2020-04-21 – 2020-04-23 (×3): 20 mg via ORAL
  Filled 2020-04-21 (×3): qty 1

## 2020-04-21 MED ORDER — LABETALOL HCL 5 MG/ML IV SOLN
5.0000 mg | INTRAVENOUS | Status: DC | PRN
  Administered 2020-04-22: 10 mg via INTRAVENOUS
  Filled 2020-04-21: qty 4

## 2020-04-21 MED ORDER — POTASSIUM CHLORIDE 10 MEQ/100ML IV SOLN
10.0000 meq | INTRAVENOUS | Status: AC
  Administered 2020-04-21 (×2): 10 meq via INTRAVENOUS
  Filled 2020-04-21 (×2): qty 100

## 2020-04-21 NOTE — Progress Notes (Signed)
VASCULAR LAB    Carotid duplex has been performed.  See CV proc for preliminary results.   Montoya Brandel, RVT 04/21/2020, 5:33 PM

## 2020-04-21 NOTE — Progress Notes (Incomplete Revision)
STROKE TEAM PROGRESS NOTE  Brief HPI Bobby Branch is a 60 y.o. 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 CTH with ASPECTS of 10. ATS with R M2/3 occlusion, R ICA occlusion with distal intracranial reconstitution, L ICA string sign at the bifurcation. Outside tPA at arrival. Taken to IR for thrombectomy.   INTERVAL HISTORY No acute events since yesterday   Patient is extubated and seems to be breathing well.  He is eating applesauce.  Blood pressure adequately controlled on cleviprex drip. Fiance at bedside. Daughter on facetime during my visit. They both report patient called them all night because he could not rest well.  We discussed stroke diagnosis, work up and plan of care. Questions answered.  RN at bedside. She reports patient was very active this morning, sat up and ate breakfast with good appetite.   Vitals:   04/21/20 0630 04/21/20 0635 04/21/20 0640 04/21/20 0645  BP:      Pulse: 84 80 78 79  Resp: 14 20 (!) 27 (!) 29  Temp:      TempSrc:      SpO2: 97% 97% 97% 97%  Weight:      Height:       CBC:  Recent Labs  Lab 04/20/20 0011 04/20/20 0022 04/20/20 0500 04/21/20 0611  WBC 7.2  --  10.4 13.9*  NEUTROABS 4.8  --   --   --   HGB 13.6   < > 13.4 13.1  HCT 41.1   < > 39.8 37.5*  MCV 94.9  --  94.1 91.9  PLT 338  --  356 364   < > = values in this interval not displayed.   Basic Metabolic Panel:  Recent Labs  Lab 04/20/20 0500 04/21/20 0611  NA 132* 134*  K 4.3 3.4*  CL 102 105  CO2 23 23  GLUCOSE 316* 141*  BUN 17 19  CREATININE 2.01* 2.49*  CALCIUM 8.0* 7.9*   Lipid Panel:  Recent Labs  Lab 04/20/20 0500  CHOL 229*  TRIG 238*  HDL 83  CHOLHDL 2.8  VLDL 48*  LDLCALC 98   HgbA1c:  Recent Labs  Lab 04/20/20 0500  HGBA1C 7.3*    Urine Drug Screen:  Recent Labs  Lab 04/20/20 1757  LABOPIA NONE DETECTED  COCAINSCRNUR POSITIVE*  LABBENZ NONE DETECTED  AMPHETMU NONE DETECTED  THCU POSITIVE*  LABBARB NONE DETECTED    Alcohol Level  Recent Labs  Lab 04/20/20 0011  ETH <10    IMAGING past 24 hours MR BRAIN WO CONTRAST  Result Date: 04/21/2020 CLINICAL DATA:  60 year old male code stroke presentation with right ICA/MCA tandem occlusion, abnormal CT Perfusion. Status post Neurointerventional endovascular reperfusion. EXAM: MRI HEAD WITHOUT CONTRAST TECHNIQUE: Multiplanar, multiecho pulse sequences of the brain and surrounding structures were obtained without intravenous contrast. COMPARISON:  CTA head, CTP and NIR 04/20/2020. FINDINGS: Study is intermittently degraded by motion artifact despite repeated imaging attempts. Brain: Patchy and confluent restricted diffusion in the right ACA and right MCA territory (series 7, image 51). Cortical and subcortical white matter involvement. Involvement of the posterior right insula. Scattered additional periventricular and white matter small foci of restricted diffusion in the right hemisphere including some of the anterior occipital lobe. The most confluent area of right MCA involvement in  the posterior MCA region is similar to the predicted infarct core by CTP (series 5, image 87 today). And some of the posterior right ACA involvement was also predicted by CTP. No contralateral left cerebral hemisphere restricted diffusion but there is a small focus of diffusion restriction in the central right cerebellum on series 5, image 59. Cytotoxic edema. Possible petechial hemorrhage right posterosuperior temporal gyrus (series 12, image 9). No other acute intracranial hemorrhage identified. Possible chronic microhemorrhage anterior right inferior frontal gyrus series 12, image 13. No intracranial mass effect. No ventriculomegaly. Chronic appearing lacunar infarcts in the bilateral deep gray  matter nuclei, the right cerebellar peduncle, the bilateral ventral pons, and bilateral cerebellum. Cervicomedullary junction and pituitary are within normal limits. Vascular: Major intracranial vascular flow voids are preserved. Skull and upper cervical spine: Negative. Sinuses/Orbits: Stable. Chronic postoperative changes to the left orbital walls. Other: Trace mastoid fluid. IMPRESSION: 1. Patchy and confluent acute infarcts in the right ACA and right MCA territory. The most confluent appearing similar to predicted infarct core by CTP. Possible petechial blood, Heidelberg class 1a, right superior temporal gyrus. No significant intracranial mass effect. 2. Punctate acute infarct also in the right cerebellum. 3. Chronic lacunar infarcts in the bilateral deep gray nuclei, bilateral brainstem and cerebellum. Electronically Signed   By: Genevie Ann M.D.   On: 04/21/2020 05:59   ECHOCARDIOGRAM COMPLETE BUBBLE STUDY  Result Date: 04/20/2020    ECHOCARDIOGRAM REPORT   Patient Name:   Bobby DEBERARDINIS Date of Exam: 04/20/2020 Medical Rec #:  [id number]    Height: Accession #:    [id number]   Weight: Date of Birth:  09/11/1960    BSA: Patient Age:    51 years     BP:           140/82 mmHg Patient Gender: M            HR:           83 bpm. Exam Location:  Inpatient Procedure: 2D Echo, Cardiac Doppler, Color Doppler and Saline Contrast Bubble            Study Indications:    Stroke 434.91 / I63.9  History:        Patient has no prior history of Echocardiogram examinations.  Sonographer:    Darlina Sicilian RDCS Referring Phys: [id number] [place]  1. Technically suboptima saline microcavitation study; no obvious shunt but cannot completely exclude with this study.  2. Left ventricular ejection fraction, by estimation, is 55 to 60%. The left ventricle has normal function. The left ventricle has no regional wall motion abnormalities. There is mild left ventricular hypertrophy. Left ventricular diastolic parameters are  consistent with Grade I diastolic dysfunction (impaired relaxation).  3. Right ventricular systolic function is normal. The right ventricular size is normal.  4. The mitral valve is normal in structure. No evidence of mitral valve regurgitation. No evidence of mitral stenosis.  5. The aortic valve is tricuspid. Aortic valve regurgitation is not visualized. No aortic stenosis is present.  6. The inferior vena cava is normal in size with greater than 50% respiratory variability, suggesting right atrial pressure of 3 mmHg. FINDINGS  Left Ventricle: Left ventricular ejection fraction, by estimation, is 55 to 60%. The left ventricle has normal function. The left ventricle has no regional wall motion abnormalities. The left ventricular internal cavity size was normal in size. There is  mild left ventricular hypertrophy. Left ventricular diastolic parameters are consistent with Grade I diastolic dysfunction (impaired relaxation).  Right Ventricle: The right ventricular size is normal.Right ventricular systolic function is normal. Left Atrium: Left atrial size was normal in size. Right Atrium: Right atrial size was normal in size. Pericardium: There is no evidence of pericardial effusion. Mitral Valve: The mitral valve is normal in structure. Mild mitral annular calcification. No evidence of mitral valve regurgitation. No evidence of mitral valve stenosis. Tricuspid Valve: The tricuspid valve is normal in structure. Tricuspid valve regurgitation is trivial. No evidence of tricuspid stenosis. Aortic Valve: The aortic valve is tricuspid. Aortic valve regurgitation is not visualized. No aortic stenosis is present. Pulmonic Valve: The pulmonic valve was not well visualized. Pulmonic valve regurgitation is not visualized. No evidence of pulmonic stenosis. Aorta: The aortic root is normal in size and structure. Venous: The inferior vena cava is normal in size with greater than 50% respiratory variability, suggesting right atrial  pressure of 3 mmHg. IAS/Shunts: The interatrial septum is aneurysmal. No atrial level shunt detected by color flow Doppler. Agitated saline contrast was given intravenously to evaluate for intracardiac shunting. Additional Comments: Technically suboptima saline microcavitation study; no obvious shunt but cannot completely exclude with this study.  LEFT VENTRICLE PLAX 2D LVIDd:         3.80 cm      Diastology LVIDs:         2.50 cm      LV e' medial:    6.16 cm/s LV PW:         1.20 cm      LV E/e' medial:  10.8 LV IVS:        1.20 cm      LV e' lateral:   6.97 cm/s LVOT diam:     2.00 cm      LV E/e' lateral: 9.5 LV SV:         67 LVOT Area:     3.14 cm  LV Volumes (MOD) LV vol d, MOD A2C: 104.0 ml LV vol d, MOD A4C: 127.0 ml LV vol s, MOD A2C: 52.4 ml LV vol s, MOD A4C: 67.3 ml LV SV MOD A2C:     51.6 ml LV SV MOD A4C:     127.0 ml LV SV MOD BP:      57.0 ml RIGHT VENTRICLE RV S prime:     17.20 cm/s TAPSE (M-mode): 2.0 cm LEFT ATRIUM             RIGHT ATRIUM LA diam:        3.40 cm RA Area:     7.14 cm LA Vol (A2C):   36.7 ml RA Volume:   9.67 ml LA Vol (A4C):   38.1 ml LA Biplane Vol: 38.0 ml  AORTIC VALVE LVOT Vmax:   114.00 cm/s LVOT Vmean:  70.200 cm/s LVOT VTI:    0.214 m  AORTA Ao Root diam: 3.00 cm MITRAL VALVE MV Area (PHT): 2.87 cm     SHUNTS MV Decel Time: 264 msec     Systemic VTI:  0.21 m MV E velocity: 66.40 cm/s   Systemic Diam: 2.00 cm MV A velocity: 107.00 cm/s MV E/A ratio:  0.62 Kirk Ruths MD Electronically signed by Kirk Ruths MD Signature Date/Time: 04/20/2020/5:20:59 PM    Final    PHYSICAL EXAM   Middle aged male lying in bed in NAD.  Constitutional: Calm, mildly drowsy, arouses easily to verbal stimuli.  Cardiovascular: Regular rate and rhythm on tele  Respiratory: No extra work of breathing on RA.  Mental status: Oriented to person, month/year place,situation.  Follows commands well.  Speech: Fluent with naming intact.+Dysarthria.  Cranial nerves: Right gaze  preference but able to look to the left of midline. Decreased blink to threat on the left compared to the right., Left lower facial weakness, tongue midline, diminished shoulder shrug on the left. Motor: Normal bulk and tone. Left sided hemiplegia with 1/5 left upper extremity and 2/5 left lower extremity strength.  Right hemibody full strength.   Sensory: Decreased sensation to light touch on the left. Left sided extinction noted with bilateral tactile stimulation to BLE not noted in BUE.  Coordination: UTA on the left d/t weakness. Right side intact.  Gait: Deferred   ASSESSMENT/PLAN Bobby Branch is a 60 y.o. 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 CTH with ASPECTS of 10. ATS with R M2/3 occlusion, R ICA occlusion with distal intracranial reconstitution, L ICA string sign at the bifurcation. Outside tPA at arrival. Taken to IR for thrombectomy.  Stroke:  Right MCA stroke with right M2/3 occlusion s/p emergent cerebral angiogram with  successful thrombectomy achieving a TICI 3 revascularization.  Small volume SAH in the sylvian sulci at completion. Cocaine positive on admission.   CT Head without contrast: -CTH was negative for a large hypodensity concerning for a large territory infarct or hyperdensity concerning for an ICH. ASPECTs of 10.  CT angio Head and Neck with contrast: -R M2/3 thrombus -R ICA occlusion with distal intracranial reconstitution -L ICA string sign at the bifurcation.  MRI Brain - Patchy and confluent acute infarcts in the right ACA and right MCA territory. The most confluent appearing similar to predicted infarct core by CTP. Possible petechial blood, Heidelberg class 1a,right superior temporal gyrus. Punctate acute infarct also in the right cerebellum.  Chronic lacunar infarcts in the bilateral deep gray nuclei, bilateral brainstem and cerebellum.  2D Echo  -Technically suboptimal saline microcavitation study; no obvious shunt, EF is 55 to 60%. mild LVH. Grade I Diastolic dysfunction. No thrombus. No wall motion abnormalities. Carotid Doppler study is pending  LDL 98  HgbA1c 7.3  TSH wnl  ANA, Cardiolipin, Beta 2-glycoprotein, Lupus labs                 pending  VTE prophylaxis - consider 3/19. Lovenox '40mg'$  daily   Swallow was validated with regular diet/thin liquids                recommended. Tolerating well per nursing.     Diet   Diet regular Room service appropriate? Yes with Assist; Fluid consistency: Thin   On ASA 81 mg and Plavix '75mg'$  daily PTA  Will likely need aspirin and Plavix following follow-up            imaging with no significant hemorrhage  Therapy recommendations:  TBD  Disposition:  TBD  Hypertension -On cleviprex drip, weaning per RN -Home medications Norvasc 10and hydralazine '25mg'$  daily on board  -blood pressure control with systolic goal below 0000000  -prn BB added   Hyperlipidemia -Home meds:  Atorvastatin '40mg'$  daily -LDL 98, goal < 70 -High intensity statin: continue home atorvastatin -Continue statin at discharge  Diabetes type II Uncontrolled -Home meds:  Metformin 500 BID -HgbA1c 7.3 not quite at goal < 7.0 -CBGs Recent Labs  04/20/20 1947 04/20/20 2315 04/21/20 0330  GLUCAP 170* 62* 139*     -SSI  s/p cerebral arteriogram with emergent mechanical thrombectomy right ICA bifurcation occlusion and right MCA M2 occlusion achieving a TICI 3 revascularization via right femoral approach 04/20/2020 by Dr. Earleen Newport. -recommend possible revascularization this admission at discretion of neurology (please consult NIR if this is desired during this admission). -NIR following  Hypokalemia K+-3.4 Monitor and replete   Other Stroke Risk Factors Advanced Age >/= 36  Hx stroke/TIA   Other  Active Problems    Delila A Bailey-Modzik , NP-C   ATTENDING NOTE: I reviewed above note and agree with the assessment and plan. Pt was seen and examined.   Neuro - awake, alert, eyes open, orientated to age, place, time and people. No aphasia, fluent language, following all simple commands. Able to name and repeat. Mild dysarthria. No gaze palsy, tracking bilaterally, visual field full, PERRL. Left facial droop. Tongue midline. RUE and RLE 5/5, no drift. LUE and LLE withdraw to pain, 2/5 LUE and 3-/5 LLE. Sensation symmetrical bilaterally, right FTN intact, gait not tested.    For detailed assessment and plan, please refer to above as I have made changes wherever appropriate.   Rosalin Hawking, MD PhD Stroke Neurology 04/21/2020 4:07 PM     To contact Stroke Continuity provider, please refer to [email or web address] After hours, contact General Neurology

## 2020-04-21 NOTE — Progress Notes (Signed)
OT Cancellation Note  Patient Details Name: Bobby Branch MRN: [id number] DOB: September 05, 1960   Cancelled Treatment:    Reason Eval/Treat Not Completed: Active bedrest order  Malka So 04/21/2020, 7:42 AM  Nestor Lewandowsky, OTR/L Acute Rehabilitation Services Pager: [phone] Office: [phone]

## 2020-04-21 NOTE — Progress Notes (Addendum)
STROKE TEAM PROGRESS NOTE  Brief HPI Bobby Branch is a 60 y.o. 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 CTH with ASPECTS of 10. ATS with R M2/3 occlusion, R ICA occlusion with distal intracranial reconstitution, L ICA string sign at the bifurcation. Outside tPA at arrival. Taken to IR for thrombectomy.   INTERVAL HISTORY No acute events since yesterday   Patient is extubated and seems to be breathing well.  He is eating applesauce.  Blood pressure adequately controlled on cleviprex drip. Fiance at bedside. Daughter on facetime during my visit. They both report patient called them all night because he could not rest well.  We discussed stroke diagnosis, work up and plan of care. Questions answered.  RN at bedside. She reports patient was very active this morning, sat up and ate breakfast with good appetite.   Vitals:   04/21/20 0630 04/21/20 0635 04/21/20 0640 04/21/20 0645  BP:      Pulse: 84 80 78 79  Resp: 14 20 (!) 27 (!) 29  Temp:      TempSrc:      SpO2: 97% 97% 97% 97%  Weight:      Height:       CBC:  Recent Labs  Lab 04/20/20 0011 04/20/20 0022 04/20/20 0500 04/21/20 0611  WBC 7.2  --  10.4 13.9*  NEUTROABS 4.8  --   --   --   HGB 13.6   < > 13.4 13.1  HCT 41.1   < > 39.8 37.5*  MCV 94.9  --  94.1 91.9  PLT 338  --  356 364   < > = values in this interval not displayed.   Basic Metabolic Panel:  Recent Labs  Lab 04/20/20 0500 04/21/20 0611  NA 132* 134*  K 4.3 3.4*  CL 102 105  CO2 23 23  GLUCOSE 316* 141*  BUN 17 19  CREATININE 2.01* 2.49*  CALCIUM 8.0* 7.9*   Lipid Panel:  Recent Labs  Lab 04/20/20 0500  CHOL 229*  TRIG 238*  HDL 83  CHOLHDL 2.8  VLDL 48*  LDLCALC 98   HgbA1c:  Recent Labs  Lab 04/20/20 0500  HGBA1C 7.3*    Urine Drug Screen:  Recent Labs  Lab 04/20/20 1757  LABOPIA NONE DETECTED  COCAINSCRNUR POSITIVE*  LABBENZ NONE DETECTED  AMPHETMU NONE DETECTED  THCU POSITIVE*  LABBARB NONE DETECTED    Alcohol Level  Recent Labs  Lab 04/20/20 0011  ETH <10    IMAGING past 24 hours MR BRAIN WO CONTRAST  Result Date: 04/21/2020 CLINICAL DATA:  60 year old male code stroke presentation with right ICA/MCA tandem occlusion, abnormal CT Perfusion. Status post Neurointerventional endovascular reperfusion. EXAM: MRI HEAD WITHOUT CONTRAST TECHNIQUE: Multiplanar, multiecho pulse sequences of the brain and surrounding structures were obtained without intravenous contrast. COMPARISON:  CTA head, CTP and NIR 04/20/2020. FINDINGS: Study is intermittently degraded by motion artifact despite repeated imaging attempts. Brain: Patchy and confluent restricted diffusion in the right ACA and right MCA territory (series 7, image 51). Cortical and subcortical white matter involvement. Involvement of the posterior right insula. Scattered additional periventricular and white matter small foci of restricted diffusion in the right hemisphere including some of the anterior occipital lobe. The most confluent area of right MCA involvement in  the posterior MCA region is similar to the predicted infarct core by CTP (series 5, image 87 today). And some of the posterior right ACA involvement was also predicted by CTP. No contralateral left cerebral hemisphere restricted diffusion but there is a small focus of diffusion restriction in the central right cerebellum on series 5, image 59. Cytotoxic edema. Possible petechial hemorrhage right posterosuperior temporal gyrus (series 12, image 9). No other acute intracranial hemorrhage identified. Possible chronic microhemorrhage anterior right inferior frontal gyrus series 12, image 13. No intracranial mass effect. No ventriculomegaly. Chronic appearing lacunar infarcts in the bilateral deep gray  matter nuclei, the right cerebellar peduncle, the bilateral ventral pons, and bilateral cerebellum. Cervicomedullary junction and pituitary are within normal limits. Vascular: Major intracranial vascular flow voids are preserved. Skull and upper cervical spine: Negative. Sinuses/Orbits: Stable. Chronic postoperative changes to the left orbital walls. Other: Trace mastoid fluid. IMPRESSION: 1. Patchy and confluent acute infarcts in the right ACA and right MCA territory. The most confluent appearing similar to predicted infarct core by CTP. Possible petechial blood, Heidelberg class 1a, right superior temporal gyrus. No significant intracranial mass effect. 2. Punctate acute infarct also in the right cerebellum. 3. Chronic lacunar infarcts in the bilateral deep gray nuclei, bilateral brainstem and cerebellum. Electronically Signed   By: Genevie Ann M.D.   On: 04/21/2020 05:59   ECHOCARDIOGRAM COMPLETE BUBBLE STUDY  Result Date: 04/20/2020    ECHOCARDIOGRAM REPORT   Patient Name:   Bobby Branch Date of Exam: 04/20/2020 Medical Rec #:  [id number]    Height: Accession #:    [id number]   Weight: Date of Birth:  December 20, 1960    BSA: Patient Age:    39 years     BP:           140/82 mmHg Patient Gender: M            HR:           83 bpm. Exam Location:  Inpatient Procedure: 2D Echo, Cardiac Doppler, Color Doppler and Saline Contrast Bubble            Study Indications:    Stroke 434.91 / I63.9  History:        Patient has no prior history of Echocardiogram examinations.  Sonographer:    Darlina Sicilian RDCS Referring Phys: [id number] Roberts  1. Technically suboptima saline microcavitation study; no obvious shunt but cannot completely exclude with this study.  2. Left ventricular ejection fraction, by estimation, is 55 to 60%. The left ventricle has normal function. The left ventricle has no regional wall motion abnormalities. There is mild left ventricular hypertrophy. Left ventricular diastolic parameters are  consistent with Grade I diastolic dysfunction (impaired relaxation).  3. Right ventricular systolic function is normal. The right ventricular size is normal.  4. The mitral valve is normal in structure. No evidence of mitral valve regurgitation. No evidence of mitral stenosis.  5. The aortic valve is tricuspid. Aortic valve regurgitation is not visualized. No aortic stenosis is present.  6. The inferior vena cava is normal in size with greater than 50% respiratory variability, suggesting right atrial pressure of 3 mmHg. FINDINGS  Left Ventricle: Left ventricular ejection fraction, by estimation, is 55 to 60%. The left ventricle has normal function. The left ventricle has no regional wall motion abnormalities. The left ventricular internal cavity size was normal in size. There is  mild left ventricular hypertrophy. Left ventricular diastolic parameters are consistent with Grade I diastolic dysfunction (impaired relaxation).  Right Ventricle: The right ventricular size is normal.Right ventricular systolic function is normal. Left Atrium: Left atrial size was normal in size. Right Atrium: Right atrial size was normal in size. Pericardium: There is no evidence of pericardial effusion. Mitral Valve: The mitral valve is normal in structure. Mild mitral annular calcification. No evidence of mitral valve regurgitation. No evidence of mitral valve stenosis. Tricuspid Valve: The tricuspid valve is normal in structure. Tricuspid valve regurgitation is trivial. No evidence of tricuspid stenosis. Aortic Valve: The aortic valve is tricuspid. Aortic valve regurgitation is not visualized. No aortic stenosis is present. Pulmonic Valve: The pulmonic valve was not well visualized. Pulmonic valve regurgitation is not visualized. No evidence of pulmonic stenosis. Aorta: The aortic root is normal in size and structure. Venous: The inferior vena cava is normal in size with greater than 50% respiratory variability, suggesting right atrial  pressure of 3 mmHg. IAS/Shunts: The interatrial septum is aneurysmal. No atrial level shunt detected by color flow Doppler. Agitated saline contrast was given intravenously to evaluate for intracardiac shunting. Additional Comments: Technically suboptima saline microcavitation study; no obvious shunt but cannot completely exclude with this study.  LEFT VENTRICLE PLAX 2D LVIDd:         3.80 cm      Diastology LVIDs:         2.50 cm      LV e' medial:    6.16 cm/s LV PW:         1.20 cm      LV E/e' medial:  10.8 LV IVS:        1.20 cm      LV e' lateral:   6.97 cm/s LVOT diam:     2.00 cm      LV E/e' lateral: 9.5 LV SV:         67 LVOT Area:     3.14 cm  LV Volumes (MOD) LV vol d, MOD A2C: 104.0 ml LV vol d, MOD A4C: 127.0 ml LV vol s, MOD A2C: 52.4 ml LV vol s, MOD A4C: 67.3 ml LV SV MOD A2C:     51.6 ml LV SV MOD A4C:     127.0 ml LV SV MOD BP:      57.0 ml RIGHT VENTRICLE RV S prime:     17.20 cm/s TAPSE (M-mode): 2.0 cm LEFT ATRIUM             RIGHT ATRIUM LA diam:        3.40 cm RA Area:     7.14 cm LA Vol (A2C):   36.7 ml RA Volume:   9.67 ml LA Vol (A4C):   38.1 ml LA Biplane Vol: 38.0 ml  AORTIC VALVE LVOT Vmax:   114.00 cm/s LVOT Vmean:  70.200 cm/s LVOT VTI:    0.214 m  AORTA Ao Root diam: 3.00 cm MITRAL VALVE MV Area (PHT): 2.87 cm     SHUNTS MV Decel Time: 264 msec     Systemic VTI:  0.21 m MV E velocity: 66.40 cm/s   Systemic Diam: 2.00 cm MV A velocity: 107.00 cm/s MV E/A ratio:  0.62 Kirk Ruths MD Electronically signed by Kirk Ruths MD Signature Date/Time: 04/20/2020/5:20:59 PM    Final    PHYSICAL EXAM   Middle aged male lying in bed in NAD.  Constitutional: Calm, mildly drowsy, arouses easily to verbal stimuli.  Cardiovascular: Regular rate and rhythm on tele  Respiratory: No extra work of breathing on RA.  Mental status: Oriented to person, month/year place,situation.  Follows commands well.  Speech: Fluent with naming intact.+Dysarthria.  Cranial nerves: Right gaze  preference but able to look to the left of midline. Decreased blink to threat on the left compared to the right., Left lower facial weakness, tongue midline, diminished shoulder shrug on the left. Motor: Normal bulk and tone. Left sided hemiplegia with 1/5 left upper extremity and 2/5 left lower extremity strength.  Right hemibody full strength.   Sensory: Decreased sensation to light touch on the left. Left sided extinction noted with bilateral tactile stimulation to BLE not noted in BUE.  Coordination: UTA on the left d/t weakness. Right side intact.  Gait: Deferred   ASSESSMENT/PLAN Everet Batterton is a 60 y.o. 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 CTH with ASPECTS of 10. ATS with R M2/3 occlusion, R ICA occlusion with distal intracranial reconstitution, L ICA string sign at the bifurcation. Outside tPA at arrival. Taken to IR for thrombectomy.  Stroke:  Right MCA stroke with right M2/3 occlusion s/p emergent cerebral angiogram with  successful thrombectomy achieving a TICI 3 revascularization.  Small volume SAH in the sylvian sulci at completion. Cocaine positive on admission.   CT Head without contrast: -CTH was negative for a large hypodensity concerning for a large territory infarct or hyperdensity concerning for an ICH. ASPECTs of 10.  CT angio Head and Neck with contrast: -R M2/3 thrombus -R ICA occlusion with distal intracranial reconstitution -L ICA string sign at the bifurcation.  MRI Brain - Patchy and confluent acute infarcts in the right ACA and right MCA territory. The most confluent appearing similar to predicted infarct core by CTP. Possible petechial blood, Heidelberg class 1a,right superior temporal gyrus. Punctate acute infarct also in the right cerebellum.  Chronic lacunar infarcts in the bilateral deep gray nuclei, bilateral brainstem and cerebellum.  2D Echo  -Technically suboptimal saline microcavitation study; no obvious shunt, EF is 55 to 60%. mild LVH. Grade I Diastolic dysfunction. No thrombus. No wall motion abnormalities. Carotid Doppler study is pending  LDL 98  HgbA1c 7.3  TSH wnl  ANA, Cardiolipin, Beta 2-glycoprotein, Lupus labs                 pending  VTE prophylaxis - consider 3/19. Lovenox '40mg'$  daily   Swallow was validated with regular diet/thin liquids                recommended. Tolerating well per nursing.     Diet   Diet regular Room service appropriate? Yes with Assist; Fluid consistency: Thin   On ASA 81 mg and Plavix '75mg'$  daily PTA  Will likely need aspirin and Plavix following follow-up            imaging with no significant hemorrhage  Therapy recommendations:  TBD  Disposition:  TBD  Hypertension -On cleviprex drip, weaning per RN -Home medications Norvasc 10and hydralazine '25mg'$  daily on board  -blood pressure control with systolic goal below 0000000  -prn BB added   Hyperlipidemia -Home meds:  Atorvastatin '40mg'$  daily -LDL 98, goal < 70 -High intensity statin: continue home atorvastatin -Continue statin at discharge  Diabetes type II Uncontrolled -Home meds:  Metformin 500 BID -HgbA1c 7.3 not quite at goal < 7.0 -CBGs Recent Labs  04/20/20 1947 04/20/20 2315 04/21/20 0330  GLUCAP 170* 62* 139*     -SSI  s/p cerebral arteriogram with emergent mechanical thrombectomy right ICA bifurcation occlusion and right MCA M2 occlusion achieving a TICI 3 revascularization via right femoral approach 04/20/2020 by Dr. Earleen Newport. -recommend possible revascularization this admission at discretion of neurology (please consult NIR if this is desired during this admission). -NIR following  Hypokalemia K+-3.4 Monitor and replete   Other Stroke Risk Factors Advanced Age >/= 64  Hx stroke/TIA   Other  Active Problems    Delila A Bailey-Modzik , NP-C   ATTENDING NOTE: I reviewed above note and agree with the assessment and plan. Pt was seen and examined.   60 year old male with history of hypertension, hyperlipidemia, diabetes, stroke in 09/2019 with mild residual deficit admitted for left-sided weakness and numbness, right gaze with left neglect.  CT no acute abnormality.  Status post TPA.  CT head and neck right M2/M3 occlusion and right ICA occlusion with distal reconstitution.  Left ICA string sign.  Status post IR with TICI3 but small SAH.  At end of IR, right ICA about 42% stenosis, no stent placed.  Still has left ICA string sign.  Carotid Doppler today showed right ICA 60 to 79% stenosis, left ICA 80 to 99% stenosis.  MRI showed right MCA patchy small infarct with possible petechial hemorrhage.  However, there is also a small right cerebellum infarct, not sure embolic stroke or related to procedure.  EF 55 to 60%.  LDL 98, A1c 7.3.  Creatinine 2.01.  UDS showed positive for cocaine and THC.  Hypercoagulable work-up pending.  On exam, patient awake, alert, eyes open, orientated to age, place, time and people. No aphasia, fluent language, following all simple commands. Able to name and repeat. Mild dysarthria. No gaze palsy, tracking bilaterally, visual field full, PERRL. Left facial droop. Tongue midline. RUE and RLE 5/5, no drift. LUE and LLE withdraw to pain, 2/5 LUE and 3-/5 LLE. Sensation symmetrical bilaterally, right FTN intact, gait not tested.   Patient stroke likely due to large vessel disease in the setting of right ICA occlusion and left ICA string sign.  Right M2/M3 occlusion likely due to right ICA acute occlusion.  Patient cocaine positive on UDS also suggesting cocaine related vasculopathy.  Uncontrolled diabetes with A1c 7.3 also contribute to the stroke.  Now on aspirin 325, Plavix 75 and Lipitor 40 for stroke treatment.  Cocaine cessation education provided.  Patient will need  to follow-up with IR as outpatient to consider carotid stenting in the near future.  Given bilateral ICA stenosis, long-term BP goal 130-160 before further intervention.  Currently on amlodipine 10 and Cleviprex for BP goal < 160 due to HT.  Will add lisinopril and taper off Cleviprex as able.   Rosalin Hawking, MD PhD Stroke Neurology 04/21/2020 4:07 PM  This patient is critically ill due to right MCA stroke status post TPA and thrombectomy, bilateral ICA occlusion/high-grade stenosis and at significant risk of neurological worsening, death form recurrent stroke, hemorrhagic conversion, seizure. This patient's care requires constant monitoring of vital signs, hemodynamics, respiratory and cardiac monitoring, review of multiple databases, neurological assessment, discussion with family, other specialists and medical decision making of high complexity. I spent 35 minutes of neurocritical care time in the care of this patient.   To contact Stroke Continuity provider, please refer to [email or web address] After hours, contact General Neurology

## 2020-04-22 DIAGNOSIS — F1410 Cocaine abuse, uncomplicated: Secondary | ICD-10-CM

## 2020-04-22 DIAGNOSIS — E7800 Pure hypercholesterolemia, unspecified: Secondary | ICD-10-CM

## 2020-04-22 DIAGNOSIS — E1165 Type 2 diabetes mellitus with hyperglycemia: Secondary | ICD-10-CM

## 2020-04-22 DIAGNOSIS — I10 Essential (primary) hypertension: Secondary | ICD-10-CM

## 2020-04-22 LAB — BASIC METABOLIC PANEL
Anion gap: 8 (ref 5–15)
BUN: 15 mg/dL (ref 6–20)
CO2: 23 mmol/L (ref 22–32)
Calcium: 8.3 mg/dL — ABNORMAL LOW (ref 8.9–10.3)
Chloride: 105 mmol/L (ref 98–111)
Creatinine, Ser: 2.31 mg/dL — ABNORMAL HIGH (ref 0.61–1.24)
GFR, Estimated: 32 mL/min — ABNORMAL LOW (ref >60)
Glucose, Bld: 106 mg/dL — ABNORMAL HIGH (ref 70–99)
Potassium: 3.5 mmol/L (ref 3.5–5.1)
Sodium: 136 mmol/L (ref 135–145)

## 2020-04-22 LAB — GLUCOSE, CAPILLARY
Glucose-Capillary: 100 mg/dL — ABNORMAL HIGH (ref 70–99)
Glucose-Capillary: 110 mg/dL — ABNORMAL HIGH (ref 70–99)
Glucose-Capillary: 111 mg/dL — ABNORMAL HIGH (ref 70–99)
Glucose-Capillary: 151 mg/dL — ABNORMAL HIGH (ref 70–99)
Glucose-Capillary: 226 mg/dL — ABNORMAL HIGH (ref 70–99)
Glucose-Capillary: 97 mg/dL (ref 70–99)

## 2020-04-22 LAB — CBC
HCT: 35.8 % — ABNORMAL LOW (ref 39.0–52.0)
Hemoglobin: 12.4 g/dL — ABNORMAL LOW (ref 13.0–17.0)
MCH: 31.4 pg (ref 26.0–34.0)
MCHC: 34.6 g/dL (ref 30.0–36.0)
MCV: 90.6 fL (ref 80.0–100.0)
Platelets: 324 10*3/uL (ref 150–400)
RBC: 3.95 MIL/uL — ABNORMAL LOW (ref 4.22–5.81)
RDW: 13.5 % (ref 11.5–15.5)
WBC: 11.6 10*3/uL — ABNORMAL HIGH (ref 4.0–10.5)
nRBC: 0 % (ref 0.0–0.2)

## 2020-04-22 LAB — RAPID HIV SCREEN (HIV 1/2 AB+AG)
HIV 1/2 Antibodies: NONREACTIVE
HIV-1 P24 Antigen - HIV24: NONREACTIVE

## 2020-04-22 MED ORDER — LISINOPRIL 20 MG PO TABS
20.0000 mg | ORAL_TABLET | Freq: Every day | ORAL | Status: DC
  Administered 2020-04-22: 20 mg via ORAL
  Filled 2020-04-22: qty 1

## 2020-04-22 MED ORDER — HYDRALAZINE HCL 20 MG/ML IJ SOLN: 10.0000 mg | INTRAMUSCULAR | Status: DC | PRN

## 2020-04-22 MED ORDER — ASPIRIN EC 325 MG PO TBEC
325.0000 mg | DELAYED_RELEASE_TABLET | Freq: Every day | ORAL | Status: DC
  Administered 2020-04-22 – 2020-04-23 (×2): 325 mg via ORAL
  Filled 2020-04-22 (×2): qty 1

## 2020-04-22 MED ORDER — DOCUSATE SODIUM 100 MG PO CAPS
100.0000 mg | ORAL_CAPSULE | Freq: Two times a day (BID) | ORAL | Status: DC
  Administered 2020-04-22 – 2020-04-23 (×3): 100 mg via ORAL
  Filled 2020-04-22 (×3): qty 1

## 2020-04-22 MED ORDER — POLYETHYLENE GLYCOL 3350 17 G PO PACK: 17.0000 g | PACK | Freq: Once | ORAL | Status: DC

## 2020-04-22 NOTE — Progress Notes (Signed)
Rehab Admissions Coordinator Note:  Patient was screened by Cleatrice Burke for appropriateness for an Inpatient Acute Rehab Consult.  At this time, we are recommending Inpatient Rehab consult. I will place order per protocol.  Cleatrice Burke RN MSN 04/22/2020, 4:52 PM  I can be reached at [phone].

## 2020-04-22 NOTE — Progress Notes (Signed)
Pt taken to 3W by RN and NT on telemetry. Phone, phone charger, glasses and all other belongings taken with patient. Patients chart given to Information systems manager.  Patient stable, No change in VS or mentation.

## 2020-04-22 NOTE — Progress Notes (Signed)
STROKE TEAM PROGRESS NOTE   INTERVAL HISTORY No family at the bedside. Pt lying in bed sleeping, easily arousable and orientated. However, still has left hemiplegia. Just off cleviprex, BP 130-140. Will d/c lisinopril, pt BP goal 140-160 if able given his severe b/l carotid stenosis.   Vitals:   04/22/20 0700 04/22/20 0715 04/22/20 0739 04/22/20 1122  BP: (!) 153/90 (!) 162/103    Pulse: 77 86    Resp: 15 18    Temp:   98.7 F (37.1 C) 98.7 F (37.1 C)  TempSrc:   Oral Axillary  SpO2: 98% 99%    Weight:      Height:       CBC:  Recent Labs  Lab 04/20/20 0011 04/20/20 0022 04/21/20 0611 04/22/20 0254  WBC 7.2   < > 13.9* 11.6*  NEUTROABS 4.8  --   --   --   HGB 13.6   < > 13.1 12.4*  HCT 41.1   < > 37.5* 35.8*  MCV 94.9   < > 91.9 90.6  PLT 338   < > 364 324   < > = values in this interval not displayed.   Basic Metabolic Panel:  Recent Labs  Lab 04/21/20 0611 04/22/20 0254  NA 134* 136  K 3.4* 3.5  CL 105 105  CO2 23 23  GLUCOSE 141* 106*  BUN 19 15  CREATININE 2.49* 2.31*  CALCIUM 7.9* 8.3*   Lipid Panel:  Recent Labs  Lab 04/20/20 0500  CHOL 229*  TRIG 238*  HDL 83  CHOLHDL 2.8  VLDL 48*  LDLCALC 98   HgbA1c:  Recent Labs  Lab 04/20/20 0500  HGBA1C 7.3*   Urine Drug Screen:  Recent Labs  Lab 04/20/20 1757  LABOPIA NONE DETECTED  COCAINSCRNUR POSITIVE*  LABBENZ NONE DETECTED  AMPHETMU NONE DETECTED  THCU POSITIVE*  LABBARB NONE DETECTED    Alcohol Level  Recent Labs  Lab 04/20/20 0011  ETH <10    IMAGING past 24 hours No results found. PHYSICAL EXAM   Middle aged male lying in bed in NAD.  Constitutional: Calm, mildly drowsy, arouses easily to verbal stimuli.  Cardiovascular: Regular rate and rhythm on tele  Respiratory: No extra work of breathing on RA.     Neuro: sleepy drowsy but easily arousable, orientated to age, place, time and people. No aphasia, following all simple commands. Able to name and repeat. Mild to  moderate dysarthria. No gaze palsy, tracking bilaterally, visual field full, PERRL. Left facial droop. Tongue midline. RUE and RLE 5/5, no drift. LUE and LLE no spontaneous movement but withdraw to pain, 1/5 LUE and 2/5 LLE. Sensation symmetrical bilaterally, right FTN intact, gait not tested.   ASSESSMENT/PLAN Lal Harpenau is a 60 y.o. 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 CTH with ASPECTS of 10. ATS with R M2/3 occlusion, R ICA occlusion with distal intracranial reconstitution, L ICA string sign at the bifurcation. Outside tPA at arrival. Taken to IR for thrombectomy.  Stroke:  Right MCA stroke with right M2/3 occlusion and right ICA occusion s/p TPA and thrombectomy achieving a TICI 3 right MCA revascularization, likely due to large vessel disease source     CT no acute abnormality.    CT head and neck right M2/M3 occlusion  and right ICA occlusion with distal reconstitution.  Left ICA string sign.    IR with TICI3 right MCA but small SAH.  At end of IR, right ICA about 42% stenosis, no stent placed.  Still has left ICA string sign.    Carotid Doppler post op showed right ICA 60 to 79% stenosis, left ICA 80 to 99% stenosis.    MRI showed right MCA patchy small infarct with possible petechial hemorrhage.  However, there is also a small right cerebellum infarct, not sure embolic stroke or related to procedure.    EF 55 to 60%.    UDS showed positive for cocaine and THC.    Hypercoagulable work-up pending.  LDL 98  HgbA1c 7.3  VTE prophylaxis - Lovenox '40mg'$  daily   On ASA 81 mg and Plavix '75mg'$  daily PTA, now on ASA 325 and plavix 75 DAPT for 3 months   Therapy recommendations:  CIR  Disposition:  TBD  Bilateral carotid stenosis  CT head and neck right ICA occlusion  with distal reconstitution.  Left ICA string sign.    At the end of IR showed right ICA about 42% stenosis. Still has left ICA string sign.    Carotid Doppler post op showed right ICA 60 to 79% stenosis, left ICA 80 to 99% stenosis.    Discussed with Dr. Earleen Newport, will follow up closely as outpt for potential revascularization treatment  Hx of stroke  stroke in 09/2019 with mild residual deficit  Hypertension  On cleviprex drip, weaning per RN  Resumed Home medications Norvasc 10   blood pressure control with systolic goal below 0000000   Long term BP goal 130-160 given b/l carotid stenosis  Hyperlipidemia  Home meds:  Atorvastatin '40mg'$  daily  -LDL 98, goal < 70  Continue home atorvastatin 40  Continue statin at discharge  Diabetes type II Uncontrolled  Home meds:  Metformin 500 BID  HgbA1c 7.3 not quite at goal < 7.0  CBGs  SSI  Close PCP follow up  Cocaine abuse  UDS positive for cocaine  Cocaine cessation education provided  Patient is willing to treat  Other Stroke Risk Factors  UDS positive for THC, cessation education provided   Other Active Problems  Leukocytosis, WBC 13.9-11.6  CKD stage IIIb, creatinine 2.01->2.49-> 2.31  Hospital day #2  This patient is critically ill due to right MCA stroke status post TPA and thrombectomy, bilateral ICA stenosis, CKD, left hemiplegia and at significant risk of neurological worsening, death form recurrent stroke, seizure, hemorrhagic conversion, renal failure, heart failure. This patient's care requires constant monitoring of vital signs, hemodynamics, respiratory and cardiac monitoring, review of multiple databases, neurological assessment, discussion with family, other specialists and medical decision making of high complexity. I spent 30 minutes of neurocritical care time in the care of this patient.  Rosalin Hawking, MD PhD Stroke Neurology 04/22/2020 3:10 PM   To contact Stroke Continuity provider, please  refer to [email or web address] After hours, contact General Neurology

## 2020-04-22 NOTE — Evaluation (Signed)
Physical Therapy Evaluation Patient Details Name: Bobby Branch MRN: [id number] DOB: [birth year] Today's Date: 04/22/2020   History of Present Illness  60 y.o. 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 s/p emergent cerebral angiogram with  successful thrombectomy with small volume SAH sylvian sulci; cocaine +  Clinical Impression   Pt admitted with above diagnosis. Patient had returned to modified independent status from prior CVA. Patient currently requires +2 mod assist for stand-pivot transfer due to LLE weakness and impaired balance. Session focused on pt transferring to [facility] (per RN first time up) and pt then felt he needed to sit for awhile and further testing/mobility deferred. Unclear the level of support he will have on discharge. Pt currently with functional limitations due to the deficits listed below (see PT Problem List). Pt will benefit from skilled PT to increase their independence and safety with mobility to allow discharge to the venue listed below.       Follow Up Recommendations CIR    Equipment Recommendations  Rolling walker with 5" wheels;Wheelchair (measurements PT);Wheelchair cushion (measurements PT)    Recommendations for Other Services Rehab consult;OT consult     Precautions / Restrictions Precautions Precautions: Fall      Mobility  Bed Mobility Overal bed mobility: Needs Assistance Bed Mobility: Supine to Sit     Supine to sit: Mod assist;HOB elevated     General bed mobility comments: scoot to EOB to reach feet to floor max assist with pt ?giving poor effort vs decr motor planning with RUE    Transfers Overall transfer level: Needs assistance Equipment used: None Transfers: Stand Pivot Transfers   Stand pivot transfers: Mod assist;+2 physical assistance       General transfer  comment: blocked Lt knee to come to stand with 2 person assist with use of gait belt; once standing, pt able to maintain nearly full left knee extension in bil LE support however when advancing RLE, left knee supported for safety. pt reaching for far armrest of BSC with RUE appropriately during transfer. Pt wanting to sit on [facility] "a while" and RN in to check on patient and educated on his transfer status for [facility] to recliner.  Ambulation/Gait             General Gait Details: unable  Stairs            Wheelchair Mobility    Modified Rankin (Stroke Patients Only) Modified Rankin (Stroke Patients Only) Pre-Morbid Rankin Score: No significant disability Modified Rankin: Severe disability     Balance Overall balance assessment: Needs assistance Sitting-balance support: No upper extremity supported;Feet supported Sitting balance-Leahy Scale: Poor Sitting balance - Comments: left lean Postural control: Left lateral lean Standing balance support: No upper extremity supported;During functional activity Standing balance-Leahy Scale: Zero Standing balance comment: see transfer                             Pertinent Vitals/Pain Pain Assessment: No/denies pain    Home Living Family/patient expects to be discharged to:: Unsure                 Additional Comments: lives alone    Prior Function Level of Independence: Independent               Hand Dominance  Extremity/Trunk Assessment   Upper Extremity Assessment Upper Extremity Assessment: Defer to OT evaluation    Lower Extremity Assessment Lower Extremity Assessment: LLE deficits/detail LLE Deficits / Details: hip flexion 2-, knee extension 2+, ankle DF 0 LLE Sensation:  (unable to assess due to urgency to get to [facility])    Cervical / Trunk Assessment Cervical / Trunk Assessment: Other exceptions Cervical / Trunk Exceptions: left lean, trunk shortening  Communication   Communication:  Expressive difficulties  Cognition Arousal/Alertness: Awake/alert Behavior During Therapy: Flat affect Overall Cognitive Status: No family/caregiver present to determine baseline cognitive functioning                                 General Comments: slow processing, poor sequencing with extreme difficulty trying to scoot forward in sitting      General Comments      Exercises     Assessment/Plan    PT Assessment Patient needs continued PT services  PT Problem List Decreased strength;Decreased activity tolerance;Decreased balance;Decreased mobility;Decreased cognition;Decreased knowledge of use of DME;Decreased safety awareness;Decreased knowledge of precautions;Impaired sensation;Impaired tone       PT Treatment Interventions DME instruction;Gait training;Functional mobility training;Therapeutic activities;Therapeutic exercise;Balance training;Neuromuscular re-education;Cognitive remediation;Patient/family education    PT Goals (Current goals can be found in the Care Plan section)  Acute Rehab PT Goals Patient Stated Goal: pt only focused on getting to [facility] this session PT Goal Formulation: Patient unable to participate in goal setting Time For Goal Achievement: 05/06/20 Potential to Achieve Goals: Good    Frequency Min 4X/week   Barriers to discharge Decreased caregiver support      Co-evaluation               AM-PAC PT "6 Clicks" Mobility  Outcome Measure Help needed turning from your back to your side while in a flat bed without using bedrails?: A Lot Help needed moving from lying on your back to sitting on the side of a flat bed without using bedrails?: A Lot Help needed moving to and from a bed to a chair (including a wheelchair)?: Total Help needed standing up from a chair using your arms (e.g., wheelchair or bedside chair)?: Total Help needed to walk in hospital room?: Total Help needed climbing 3-5 steps with a railing? : Total 6 Click Score:  8    End of Session Equipment Utilized During Treatment: Gait belt Activity Tolerance: Patient tolerated treatment well Patient left:  (on BSC with RN, family present) Nurse Communication: Mobility status PT Visit Diagnosis: Muscle weakness (generalized) (M62.81);Hemiplegia and hemiparesis Hemiplegia - Right/Left: Left Hemiplegia - dominant/non-dominant: Non-dominant Hemiplegia - caused by: Cerebral infarction    Time: 1128-1140 PT Time Calculation (min) (ACUTE ONLY): 12 min   Charges:   PT Evaluation $PT Eval Moderate Complexity: 1 Mod           Arby Barrette, PT Pager [phone]   Rexanne Mano 04/22/2020, 1:15 PM

## 2020-04-22 NOTE — Consult Note (Signed)
Physical Medicine and Rehabilitation Consult Reason for Consult: Left side weakness Referring Physician: Dr.Xu   HPI: Bobby Branch is a 60 y.o. 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  Per chart review patient lives alone modified independent.  Presented 04/20/2020 with acute onset of left-sided weakness.  Cranial CT scan showed asymmetric hyperdensity involving the right M2/M3 branches, concerning for thrombus.  Few remote lacunar infarcts about the bilateral basal ganglia.  CT angiogram of head and neck as well as CT cerebral perfusion scan showed acute 15 cc core infarct involving the posterior right MCA distribution with a 67 cc surrounding ischemic penumbra.  Patient underwent TICI 3 right MCA revascularization per interventional radiology.  MRI follow-up showed right MCA patchy small infarct with possible petechial hemorrhage.  Carotid Dopplers with right 60 to 79%, left ICA 80 to 99% stenosis.  Echocardiogram with ejection fraction of 55 to 60% no wall motion abnormalities grade 1 diastolic dysfunction.  Admission chemistries glucose 231, creatinine 2.10, hemoglobin 13.6, hemoglobin A1c 7.3, urine drug screen positive cocaine as well as marijuana.  Patient initially maintained on Cleviprex for blood pressure control.  Neurology follow-up aspirin 325 mg daily and Plavix 75 mg daily for 3 months.  In regards to patient's bilateral carotid stenosis Dr. Earleen Newport of interventional radiology to follow-up outpatient for potential revascularization treatment.  Maintain on a regular consistency diet.  Therapy evaluations completed due to patient's left-sided weakness recommendations of physical medicine rehab consult. Patient states that his sister can provide help post discharge. No longer on Cleviprex Review of Systems  Constitutional: Negative for chills and fever.   HENT: Negative for hearing loss.   Eyes: Negative for blurred vision and double vision.  Respiratory: Negative for cough and shortness of breath.   Cardiovascular: Negative for chest pain, palpitations and leg swelling.  Gastrointestinal: Positive for constipation. Negative for heartburn, nausea and vomiting.  Genitourinary: Negative for dysuria and hematuria.  Musculoskeletal: Positive for joint pain and myalgias.  Skin: Negative for rash.  Neurological: Positive for weakness and headaches.  Psychiatric/Behavioral: Positive for depression. The patient has insomnia.   All other systems reviewed and are negative.  Past Medical History:  Diagnosis Date  . Stroke [facility]) 09/2019   Past Surgical History:  Procedure Laterality Date  . IR ANGIO EXTRACRAN SEL COM CAROTID INNOMINATE UNI L MOD SED  04/20/2020  . IR CT HEAD LTD  04/20/2020  . IR PERCUTANEOUS ART THROMBECTOMY/INFUSION INTRACRANIAL INC DIAG ANGIO  04/20/2020  . IR US GUIDE VASC ACCESS RIGHT  04/20/2020   History reviewed. No pertinent family history. Social History:  has no history on file for tobacco use, alcohol use, and drug use. Allergies: No Known Allergies Medications Prior to Admission  Medication Sig Dispense Refill  . amLODipine (NORVASC) 10 MG tablet Take 10 mg by mouth daily.    Marland Kitchen aspirin EC 81 MG tablet Take 81 mg by mouth daily. Swallow whole.    Marland Kitchen atorvastatin (LIPITOR) 40 MG tablet Take 40 mg by mouth daily.    . budesonide (PULMICORT) 0.5 MG/2ML nebulizer solution Take 0.5 mg by nebulization daily.    . clopidogrel (PLAVIX) 75 MG tablet Take 75 mg by mouth daily.    Marland Kitchen docusate sodium (COLACE) 100 MG capsule Take 100 mg by mouth daily as needed for mild constipation.    Marland Kitchen escitalopram (LEXAPRO) 10 MG tablet Take 10 mg by mouth  daily.    . famotidine (PEPCID) 20 MG tablet Take 20 mg by mouth daily.    . hydrALAZINE (APRESOLINE) 25 MG tablet Take 25 mg by mouth daily.    . metFORMIN (GLUCOPHAGE) 500 MG tablet Take  500 mg by mouth 2 (two) times daily with a meal.      Home: Home Living Family/patient expects to be discharged to:: Unsure Additional Comments: lives alone  Lives With: Alone  Functional History: Prior Function Level of Independence: Independent Functional Status:  Mobility: Bed Mobility Overal bed mobility: Needs Assistance Bed Mobility: Supine to Sit Supine to sit: Mod assist,HOB elevated General bed mobility comments: scoot to EOB to reach feet to floor max assist with pt ?giving poor effort vs decr motor planning with RUE Transfers Overall transfer level: Needs assistance Equipment used: None Transfers: Stand Pivot Transfers Stand pivot transfers: Mod assist,+2 physical assistance General transfer comment: blocked Lt knee to come to stand with 2 person assist with use of gait belt; once standing, pt able to maintain nearly full left knee extension in bil LE support however when advancing RLE, left knee supported for safety. pt reaching for far armrest of BSC with RUE appropriately during transfer. Pt wanting to sit on [facility] "a while" and RN in to check on patient and educated on his transfer status for [facility] to recliner. Ambulation/Gait General Gait Details: unable    ADL:    Cognition: Cognition Overall Cognitive Status: No family/caregiver present to determine baseline cognitive functioning Arousal/Alertness: Awake/alert Orientation Level: Oriented X4 Attention: Focused,Sustained Focused Attention: Impaired Focused Attention Impairment: Verbal complex Sustained Attention: Impaired Sustained Attention Impairment: Verbal complex Memory: Impaired Memory Impairment: Retrieval deficit,Decreased recall of new information (Immediate: 5/5; delayed: 4/5; with cue: 1/1) Awareness: Impaired Awareness Impairment: Emergent impairment Problem Solving: Appears intact Executive Function: Sequencing,Organizing Sequencing: Impaired Sequencing Impairment: Verbal complex (Clock  drawing: 0/4) Organizing: Impaired Organizing Impairment: Verbal complex (Backward digit span: 1/2 with self-correction) Cognition Arousal/Alertness: Awake/alert Behavior During Therapy: Flat affect Overall Cognitive Status: No family/caregiver present to determine baseline cognitive functioning General Comments: slow processing, poor sequencing with extreme difficulty trying to scoot forward in sitting  Blood pressure 140/87, pulse 71, temperature 98.3 F (36.8 C), temperature source Oral, resp. rate 16, height '5\' 6"'$  (1.676 m), weight 62.3 kg, SpO2 95 %. Physical Exam Vitals and nursing note reviewed.  Constitutional:      Appearance: He is normal weight.  HENT:     Head: Normocephalic and atraumatic.  Eyes:     General: No visual field deficit.    Extraocular Movements: Extraocular movements intact.     Conjunctiva/sclera: Conjunctivae normal.     Pupils: Pupils are equal, round, and reactive to light.  Cardiovascular:     Rate and Rhythm: Normal rate and regular rhythm.     Heart sounds: Normal heart sounds. No murmur heard.   Pulmonary:     Effort: Pulmonary effort is normal. No respiratory distress.     Breath sounds: Normal breath sounds. No wheezing.  Abdominal:     General: Abdomen is flat. Bowel sounds are normal. There is no distension.     Palpations: Abdomen is soft.  Musculoskeletal:     Comments: No pain with passive range of motion on the left side or with active range of motion on the right side No evidence of joint swelling in the hands or wrist no evidence of joint swelling in the feet or ankles or knees  Skin:    General: Skin is warm  and dry.  Neurological:     Mental Status: He is alert and oriented to person, place, and time.     Cranial Nerves: No dysarthria.     Sensory: No sensory deficit.     Motor: No tremor.     Gait: Gait abnormal.     Comments: Patient is a bit lethargic but arousable.  Makes eye contact with examiner.  Provides his name and  age.  Follows simple commands.  Fair awareness of deficits. Motor strength 0/5 in the left deltoid, bicep, tricep, grip 0/5 in left hip flexor knee extensor ankle dorsiflexor 5/5 in the right deltoid, bicep, tricep, grip 5/5 in the right hip flexor knee extensor ankle dorsiflexor and plantar flexor Sensation intact in the upper and lower limbs to light touch  Psychiatric:        Mood and Affect: Mood normal.        Behavior: Behavior normal.     Results for orders placed or performed during the hospital encounter of 04/20/20 (from the past 24 hour(s))  Glucose, capillary     Status: Abnormal   Collection Time: 04/21/20  7:34 PM  Result Value Ref Range   Glucose-Capillary 137 (H) 70 - 99 mg/dL  Glucose, capillary     Status: Abnormal   Collection Time: 04/21/20 11:22 PM  Result Value Ref Range   Glucose-Capillary 103 (H) 70 - 99 mg/dL  CBC     Status: Abnormal   Collection Time: 04/22/20  2:54 AM  Result Value Ref Range   WBC 11.6 (H) 4.0 - 10.5 K/uL   RBC 3.95 (L) 4.22 - 5.81 MIL/uL   Hemoglobin 12.4 (L) 13.0 - 17.0 g/dL   HCT 35.8 (L) 39.0 - 52.0 %   MCV 90.6 80.0 - 100.0 fL   MCH 31.4 26.0 - 34.0 pg   MCHC 34.6 30.0 - 36.0 g/dL   RDW 13.5 11.5 - 15.5 %   Platelets 324 150 - 400 K/uL   nRBC 0.0 0.0 - 0.2 %  Basic metabolic panel     Status: Abnormal   Collection Time: 04/22/20  2:54 AM  Result Value Ref Range   Sodium 136 135 - 145 mmol/L   Potassium 3.5 3.5 - 5.1 mmol/L   Chloride 105 98 - 111 mmol/L   CO2 23 22 - 32 mmol/L   Glucose, Bld 106 (H) 70 - 99 mg/dL   BUN 15 6 - 20 mg/dL   Creatinine, Ser 2.31 (H) 0.61 - 1.24 mg/dL   Calcium 8.3 (L) 8.9 - 10.3 mg/dL   GFR, Estimated 32 (L) >60 mL/min   Anion gap 8 5 - 15  Rapid HIV screen (HIV 1/2 Ab+Ag)     Status: None   Collection Time: 04/22/20  2:54 AM  Result Value Ref Range   HIV-1 P24 Antigen - HIV24 NON REACTIVE NON REACTIVE   HIV 1/2 Antibodies NON REACTIVE NON REACTIVE   Interpretation (HIV Ag Ab)      A  non reactive test result means that HIV 1 or HIV 2 antibodies and HIV 1 p24 antigen were not detected in the specimen.  Glucose, capillary     Status: Abnormal   Collection Time: 04/22/20  3:39 AM  Result Value Ref Range   Glucose-Capillary 110 (H) 70 - 99 mg/dL  Glucose, capillary     Status: Abnormal   Collection Time: 04/22/20  7:37 AM  Result Value Ref Range   Glucose-Capillary 100 (H) 70 - 99 mg/dL  Glucose,  capillary     Status: Abnormal   Collection Time: 04/22/20 11:21 AM  Result Value Ref Range   Glucose-Capillary 151 (H) 70 - 99 mg/dL  Glucose, capillary     Status: Abnormal   Collection Time: 04/22/20  3:15 PM  Result Value Ref Range   Glucose-Capillary 111 (H) 70 - 99 mg/dL   MR BRAIN WO CONTRAST  Result Date: 04/21/2020 CLINICAL DATA:  60 year old male code stroke presentation with right ICA/MCA tandem occlusion, abnormal CT Perfusion. Status post Neurointerventional endovascular reperfusion. EXAM: MRI HEAD WITHOUT CONTRAST TECHNIQUE: Multiplanar, multiecho pulse sequences of the brain and surrounding structures were obtained without intravenous contrast. COMPARISON:  CTA head, CTP and NIR 04/20/2020. FINDINGS: Study is intermittently degraded by motion artifact despite repeated imaging attempts. Brain: Patchy and confluent restricted diffusion in the right ACA and right MCA territory (series 7, image 51). Cortical and subcortical white matter involvement. Involvement of the posterior right insula. Scattered additional periventricular and white matter small foci of restricted diffusion in the right hemisphere including some of the anterior occipital lobe. The most confluent area of right MCA involvement in the posterior MCA region is similar to the predicted infarct core by CTP (series 5, image 87 today). And some of the posterior right ACA involvement was also predicted by CTP. No contralateral left cerebral hemisphere restricted diffusion but there is a small focus of diffusion  restriction in the central right cerebellum on series 5, image 59. Cytotoxic edema. Possible petechial hemorrhage right posterosuperior temporal gyrus (series 12, image 9). No other acute intracranial hemorrhage identified. Possible chronic microhemorrhage anterior right inferior frontal gyrus series 12, image 13. No intracranial mass effect. No ventriculomegaly. Chronic appearing lacunar infarcts in the bilateral deep gray matter nuclei, the right cerebellar peduncle, the bilateral ventral pons, and bilateral cerebellum. Cervicomedullary junction and pituitary are within normal limits. Vascular: Major intracranial vascular flow voids are preserved. Skull and upper cervical spine: Negative. Sinuses/Orbits: Stable. Chronic postoperative changes to the left orbital walls. Other: Trace mastoid fluid. IMPRESSION: 1. Patchy and confluent acute infarcts in the right ACA and right MCA territory. The most confluent appearing similar to predicted infarct core by CTP. Possible petechial blood, Heidelberg class 1a, right superior temporal gyrus. No significant intracranial mass effect. 2. Punctate acute infarct also in the right cerebellum. 3. Chronic lacunar infarcts in the bilateral deep gray nuclei, bilateral brainstem and cerebellum. Electronically Signed   By: Genevie Ann M.D.   On: 04/21/2020 05:59   VAS US CAROTID  Result Date: 04/21/2020 Carotid Arterial Duplex Study Indications:       CVA, Numbness, Weakness and CTA of neck showed right ICA                    occlusion and left ICA string sign at bifurcation. Risk Factors:      Hypertension, hyperlipidemia, Diabetes, prior CVA. Other Factors:     Polysubstance abuse. Limitations        Today's exam was limited due to the patient's inability or                    unwillingness to cooperate. Comparison Study:  No prior study on file Performing Technologist: Sharion Dove RVS  Examination Guidelines: A complete evaluation includes B-mode imaging, spectral Doppler, color  Doppler, and power Doppler as needed of all accessible portions of each vessel. Bilateral testing is considered an integral part of a complete examination. Limited examinations for reoccurring indications may be performed as noted.  Right Carotid Findings: +----------+--------+--------+--------+------------------+------------------+           PSV cm/sEDV cm/sStenosisPlaque DescriptionComments           +----------+--------+--------+--------+------------------+------------------+ CCA Prox  118     17                                intimal thickening +----------+--------+--------+--------+------------------+------------------+ CCA Distal83      12                                                   +----------+--------+--------+--------+------------------+------------------+ ICA Prox  130     29              heterogenous                         +----------+--------+--------+--------+------------------+------------------+ ICA Mid   409     107     60-79%                                       +----------+--------+--------+--------+------------------+------------------+ ICA Distal107     29                                                   +----------+--------+--------+--------+------------------+------------------+ ECA       183     36                                                   +----------+--------+--------+--------+------------------+------------------+ +----------+--------+-------+--------+-------------------+           PSV cm/sEDV cmsDescribeArm Pressure (mmHG) +----------+--------+-------+--------+-------------------+ [id number]                                        +----------+--------+-------+--------+-------------------+ +---------+--------+--+--------+--+ VertebralPSV cm/s63EDV cm/s25 +---------+--------+--+--------+--+  Left Carotid Findings: +----------+--------+--------+--------+------------------+--------------+           PSV  cm/sEDV cm/sStenosisPlaque DescriptionComments       +----------+--------+--------+--------+------------------+--------------+ CCA Prox  128     11              heterogenous                     +----------+--------+--------+--------+------------------+--------------+ CCA Distal111     18              heterogenous                     +----------+--------+--------+--------+------------------+--------------+ ICA Prox  391     121     80-99%  heterogenous                     +----------+--------+--------+--------+------------------+--------------+ ICA Distal  Not visualized +----------+--------+--------+--------+------------------+--------------+ ECA       183     21                                               +----------+--------+--------+--------+------------------+--------------+ +---------+--------+--+--------+--+ VertebralPSV cm/s83EDV cm/s22 +---------+--------+--+--------+--+   Summary: Right Carotid: Velocities in the right ICA are consistent with a 60-79%                stenosis. Left Carotid: Velocities in the left ICA are consistent with a 80-99% stenosis. Vertebrals:  Bilateral vertebral arteries demonstrate antegrade flow. Subclavians: Normal flow hemodynamics were seen in bilateral subclavian              arteries. *See table(s) above for measurements and observations.     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 Does the need for close, 24 hr/day medical supervision in concert with the patient's rehab needs make it unreasonable for this patient to be served in a less intensive setting? Potentially 2. Co-Morbidities requiring supervision/potential complications: History of substance abuse, positive urinary retention, diabetes, hypertension 3. Due to bladder management, bowel management, safety, skin/wound care, disease management, medication  administration, pain management and patient education, does the patient require 24 hr/day rehab nursing? Potentially 4. Does the patient require coordinated care of a physician, rehab nurse, therapy disciplines of PT, OT, to address physical and functional deficits in the context of the above medical diagnosis(es)? Yes Addressing deficits in the following areas: balance, endurance, locomotion, strength, transferring, bowel/bladder control, bathing, dressing, feeding, toileting and psychosocial support 5. Can the patient actively participate in an intensive therapy program of at least 3 hrs of therapy per day at least 5 days per week? Yes 6. The potential for patient to make measurable gains while on inpatient rehab is good 7. Anticipated functional outcomes upon discharge from inpatient rehab are supervision  with PT, supervision with OT, n/a with SLP. 8. Estimated rehab length of stay to reach the above functional goals is: 10 to 14 days 9. Anticipated discharge destination: Home 10. Overall Rehab/Functional Prognosis: good  RECOMMENDATIONS: This patient's condition is appropriate for continued rehabilitative care in the following setting: CIR Patient has agreed to participate in recommended program. Potentially Note that insurance prior authorization may be required for reimbursement for recommended care.  Comment: Patient indicates that sister can assist post discharge.  OT eval is pending  Cathlyn Parsons, PA-C 04/22/2020   "I have personally performed a face to face diagnostic evaluation of this patient.  Additionally, I have reviewed and concur with the physician assistant's documentation above." Charlett Blake M.D. [facility] [facility] Fellow Am Acad of [employer] Med and Rehab Diplomate Am Board of Electrodiagnostic Med Fellow Am Board of Interventional Pain

## 2020-04-23 ENCOUNTER — Other Ambulatory Visit: Payer: Self-pay

## 2020-04-23 ENCOUNTER — Inpatient Hospital Stay (HOSPITAL_COMMUNITY)
Admission: RE | Admit: 2020-04-23 | Discharge: 2020-05-22 | DRG: 056 | Disposition: A | Discharge status: Home / Self Care | Payer: Medicaid Other | Source: Intra-hospital | Attending: Physical Medicine & Rehabilitation | Admitting: Physical Medicine & Rehabilitation

## 2020-04-23 ENCOUNTER — Encounter (HOSPITAL_COMMUNITY): Payer: Self-pay | Admitting: Interventional Radiology

## 2020-04-23 DIAGNOSIS — N1832 Chronic kidney disease, stage 3b: Secondary | ICD-10-CM

## 2020-04-23 DIAGNOSIS — I69354 Hemiplegia and hemiparesis following cerebral infarction affecting left non-dominant side: Principal | ICD-10-CM

## 2020-04-23 DIAGNOSIS — E1122 Type 2 diabetes mellitus with diabetic chronic kidney disease: Secondary | ICD-10-CM | POA: Diagnosis present

## 2020-04-23 DIAGNOSIS — R059 Cough, unspecified: Secondary | ICD-10-CM | POA: Diagnosis present

## 2020-04-23 DIAGNOSIS — Z7982 Long term (current) use of aspirin: Secondary | ICD-10-CM

## 2020-04-23 DIAGNOSIS — I129 Hypertensive chronic kidney disease with stage 1 through stage 4 chronic kidney disease, or unspecified chronic kidney disease: Secondary | ICD-10-CM | POA: Diagnosis present

## 2020-04-23 DIAGNOSIS — I63511 Cerebral infarction due to unspecified occlusion or stenosis of right middle cerebral artery: Secondary | ICD-10-CM | POA: Diagnosis present

## 2020-04-23 DIAGNOSIS — E785 Hyperlipidemia, unspecified: Secondary | ICD-10-CM | POA: Diagnosis present

## 2020-04-23 DIAGNOSIS — E43 Unspecified severe protein-calorie malnutrition: Secondary | ICD-10-CM | POA: Diagnosis present

## 2020-04-23 DIAGNOSIS — Z7951 Long term (current) use of inhaled steroids: Secondary | ICD-10-CM | POA: Diagnosis not present

## 2020-04-23 DIAGNOSIS — M25512 Pain in left shoulder: Secondary | ICD-10-CM | POA: Diagnosis present

## 2020-04-23 DIAGNOSIS — I6523 Occlusion and stenosis of bilateral carotid arteries: Secondary | ICD-10-CM | POA: Diagnosis present

## 2020-04-23 DIAGNOSIS — F17200 Nicotine dependence, unspecified, uncomplicated: Secondary | ICD-10-CM | POA: Diagnosis present

## 2020-04-23 DIAGNOSIS — F32A Depression, unspecified: Secondary | ICD-10-CM | POA: Diagnosis present

## 2020-04-23 DIAGNOSIS — K5901 Slow transit constipation: Secondary | ICD-10-CM

## 2020-04-23 DIAGNOSIS — E1165 Type 2 diabetes mellitus with hyperglycemia: Secondary | ICD-10-CM | POA: Diagnosis present

## 2020-04-23 DIAGNOSIS — Z681 Body mass index (BMI) 19 or less, adult: Secondary | ICD-10-CM

## 2020-04-23 DIAGNOSIS — Z7984 Long term (current) use of oral hypoglycemic drugs: Secondary | ICD-10-CM

## 2020-04-23 DIAGNOSIS — F1210 Cannabis abuse, uncomplicated: Secondary | ICD-10-CM | POA: Diagnosis present

## 2020-04-23 DIAGNOSIS — E11649 Type 2 diabetes mellitus with hypoglycemia without coma: Secondary | ICD-10-CM | POA: Diagnosis not present

## 2020-04-23 DIAGNOSIS — F1410 Cocaine abuse, uncomplicated: Secondary | ICD-10-CM | POA: Diagnosis present

## 2020-04-23 DIAGNOSIS — M25511 Pain in right shoulder: Secondary | ICD-10-CM | POA: Diagnosis present

## 2020-04-23 DIAGNOSIS — D649 Anemia, unspecified: Secondary | ICD-10-CM | POA: Diagnosis present

## 2020-04-23 DIAGNOSIS — Z79899 Other long term (current) drug therapy: Secondary | ICD-10-CM | POA: Diagnosis not present

## 2020-04-23 DIAGNOSIS — Z7902 Long term (current) use of antithrombotics/antiplatelets: Secondary | ICD-10-CM | POA: Diagnosis not present

## 2020-04-23 DIAGNOSIS — R053 Chronic cough: Secondary | ICD-10-CM

## 2020-04-23 DIAGNOSIS — G8110 Spastic hemiplegia affecting unspecified side: Secondary | ICD-10-CM

## 2020-04-23 DIAGNOSIS — I10 Essential (primary) hypertension: Secondary | ICD-10-CM

## 2020-04-23 DIAGNOSIS — G4700 Insomnia, unspecified: Secondary | ICD-10-CM | POA: Diagnosis present

## 2020-04-23 LAB — GLUCOSE, CAPILLARY
Glucose-Capillary: 92 mg/dL (ref 70–99)
Glucose-Capillary: 90 mg/dL (ref 70–99)
Glucose-Capillary: 134 mg/dL — ABNORMAL HIGH (ref 70–99)
Glucose-Capillary: 110 mg/dL — ABNORMAL HIGH (ref 70–99)
Glucose-Capillary: 186 mg/dL — ABNORMAL HIGH (ref 70–99)
Glucose-Capillary: 160 mg/dL — ABNORMAL HIGH (ref 70–99)

## 2020-04-23 LAB — CBC
HCT: 37 % — ABNORMAL LOW (ref 39.0–52.0)
Hemoglobin: 12.4 g/dL — ABNORMAL LOW (ref 13.0–17.0)
MCH: 31 pg (ref 26.0–34.0)
MCHC: 33.5 g/dL (ref 30.0–36.0)
MCV: 92.5 fL (ref 80.0–100.0)
Platelets: 311 10*3/uL (ref 150–400)
RBC: 4 MIL/uL — ABNORMAL LOW (ref 4.22–5.81)
RDW: 13.5 % (ref 11.5–15.5)
WBC: 9.2 10*3/uL (ref 4.0–10.5)
nRBC: 0 % (ref 0.0–0.2)

## 2020-04-23 LAB — BASIC METABOLIC PANEL
Anion gap: 6 (ref 5–15)
BUN: 13 mg/dL (ref 6–20)
CO2: 24 mmol/L (ref 22–32)
Calcium: 8.4 mg/dL — ABNORMAL LOW (ref 8.9–10.3)
Chloride: 108 mmol/L (ref 98–111)
Creatinine, Ser: 2.11 mg/dL — ABNORMAL HIGH (ref 0.61–1.24)
GFR, Estimated: 36 mL/min — ABNORMAL LOW (ref >60)
Glucose, Bld: 91 mg/dL (ref 70–99)
Potassium: 3.4 mmol/L — ABNORMAL LOW (ref 3.5–5.1)
Sodium: 138 mmol/L (ref 135–145)

## 2020-04-23 LAB — VITAMIN B12: Vitamin B-12: 361 pg/mL (ref 180–914)

## 2020-04-23 MED ORDER — ACETAMINOPHEN 650 MG RE SUPP
650.0000 mg | RECTAL | Status: DC | PRN
Start: 2020-04-23 — End: 2020-05-22

## 2020-04-23 MED ORDER — DOCUSATE SODIUM 100 MG PO CAPS
100.0000 mg | ORAL_CAPSULE | Freq: Two times a day (BID) | ORAL | Status: DC
  Administered 2020-04-23 – 2020-05-22 (×57): 100 mg via ORAL
  Filled 2020-04-23 (×57): qty 1

## 2020-04-23 MED ORDER — FLEET ENEMA 7-19 GM/118ML RE ENEM
1.0000 | ENEMA | Freq: Once | RECTAL | Status: AC
  Administered 2020-04-23: 1 via RECTAL
  Filled 2020-04-23: qty 1

## 2020-04-23 MED ORDER — ACETAMINOPHEN 325 MG PO TABS
650.0000 mg | ORAL_TABLET | ORAL | Status: DC | PRN
  Administered 2020-04-23 – 2020-05-22 (×33): 650 mg via ORAL
  Filled 2020-04-23 (×32): qty 2

## 2020-04-23 MED ORDER — INSULIN ASPART 100 UNIT/ML ~~LOC~~ SOLN
0.0000 [IU] | SUBCUTANEOUS | Status: DC
  Administered 2020-04-23: 3 [IU] via SUBCUTANEOUS
  Administered 2020-04-24: 8 [IU] via SUBCUTANEOUS
  Administered 2020-04-24: 2 [IU] via SUBCUTANEOUS
  Administered 2020-04-25: 5 [IU] via SUBCUTANEOUS
  Administered 2020-04-25: 2 [IU] via SUBCUTANEOUS
  Administered 2020-04-25 – 2020-04-26 (×2): 3 [IU] via SUBCUTANEOUS

## 2020-04-23 MED ORDER — AMLODIPINE BESYLATE 10 MG PO TABS
10.0000 mg | ORAL_TABLET | Freq: Every day | ORAL | Status: DC
  Administered 2020-04-24 – 2020-05-22 (×29): 10 mg via ORAL
  Filled 2020-04-23 (×29): qty 1

## 2020-04-23 MED ORDER — ACETAMINOPHEN 160 MG/5ML PO SOLN
650.0000 mg | ORAL | Status: DC | PRN
  Administered 2020-04-25 – 2020-05-21 (×2): 650 mg
  Filled 2020-04-23 (×3): qty 20.3

## 2020-04-23 MED ORDER — CLOPIDOGREL BISULFATE 75 MG PO TABS
75.0000 mg | ORAL_TABLET | Freq: Every day | ORAL | Status: DC
  Administered 2020-04-24 – 2020-05-22 (×29): 75 mg via ORAL
  Filled 2020-04-23 (×29): qty 1

## 2020-04-23 MED ORDER — ATORVASTATIN CALCIUM 40 MG PO TABS
40.0000 mg | ORAL_TABLET | Freq: Every day | ORAL | Status: DC
  Administered 2020-04-24 – 2020-05-21 (×28): 40 mg via ORAL
  Filled 2020-04-23 (×28): qty 1

## 2020-04-23 MED ORDER — ESCITALOPRAM OXALATE 10 MG PO TABS
10.0000 mg | ORAL_TABLET | Freq: Every day | ORAL | Status: DC
  Administered 2020-04-24 – 2020-05-22 (×29): 10 mg via ORAL
  Filled 2020-04-23 (×29): qty 1

## 2020-04-23 MED ORDER — SENNOSIDES-DOCUSATE SODIUM 8.6-50 MG PO TABS
1.0000 | ORAL_TABLET | Freq: Every evening | ORAL | Status: DC | PRN
  Administered 2020-04-26: 1 via ORAL
  Filled 2020-04-23: qty 1

## 2020-04-23 MED ORDER — FAMOTIDINE 20 MG PO TABS
20.0000 mg | ORAL_TABLET | Freq: Every day | ORAL | Status: DC
  Administered 2020-04-24 – 2020-05-22 (×29): 20 mg via ORAL
  Filled 2020-04-23 (×29): qty 1

## 2020-04-23 MED ORDER — ASPIRIN EC 325 MG PO TBEC
325.0000 mg | DELAYED_RELEASE_TABLET | Freq: Every day | ORAL | Status: DC
  Administered 2020-04-24 – 2020-05-22 (×29): 325 mg via ORAL
  Filled 2020-04-23 (×29): qty 1

## 2020-04-23 NOTE — TOC Transition Note (Signed)
Transition of Care [facility]) - CM/SW Discharge Note   Patient Details  Name: Bobby Branch MRN: [id number] Date of Birth: Nov 24, 1960  [facility] of [facility]) CM/SW Contact:  Pollie Friar, RN Phone Number: 04/23/2020, 12:46 PM   Clinical Narrative:    Patient is discharging to CIR today. Pt will need appt with [facility] prior to d/c from CIR. TOC signing off.   Final next level of care: IP Rehab Facility Barriers to Discharge: Inadequate or no insurance,Barriers Unresolved (comment)   Patient Goals and CMS Choice   CMS Medicare.gov Compare Post Acute Care list provided to:: Patient Choice offered to / list presented to : Patient  Discharge Placement                       Discharge Plan and Services   Discharge Planning Services: CM Consult Post Acute Care Choice: IP Rehab                               Social Determinants of Health (SDOH) Interventions     Readmission Risk Interventions No flowsheet data found.

## 2020-04-23 NOTE — H&P (Signed)
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  Per chart review lives with his girlfriend and modified independent.    Presented 04/20/2020 with acute onset of left-sided weakness.  Cranial CT scan showed asymmetric hyperdensity involving the right M2/M3 branches concerning for thrombus.  Few remote lacunar infarcts about the bilateral basal ganglia.  CT angiogram of head and neck as well as CT cerebral perfusion scan showed acute 15 cc core infarct involving the posterior right MCA distribution with a 67 cc surrounding ischemic penumbra.  Patient underwent TICI 3 right MCA revascularization per interventional radiology.  MRI follow-up showed right MCA patchy small acute infarct with possible petechial hemorrhage.  Carotid Dopplers with right 60 to 79% left ICA 80 to 99% stenosis.  Echocardiogram with ejection fraction of 55 to 60% no wall motion abnormalities grade 1 diastolic dysfunction.  Admission chemistries glucose 231 creatinine 2.10 hemoglobin 13.6 hemoglobin A1c 7.3 urine drug screen positive cocaine as well as marijuana.  Initially maintained on Cleviprex for blood pressure control.  Neurology follow-up presently on aspirin 325 mg daily and Plavix 75 mg daily x3 months.  In regards to patient's bilateral carotid stenosis Dr. Earleen Newport of interventional radiology to follow-up outpatient for potential revascularization treatment.  He is tolerating a regular consistency diet.  Therapy evaluations completed due to patient's left-sided weakness recommendations of physical medicine rehab consult patient was admitted for a comprehensive rehab program. He currently complains of severe left sided weakness.   Review of Systems  Constitutional: Negative for  chills and fever.  HENT: Negative for hearing loss.   Eyes: Negative for blurred vision and double vision.  Respiratory: Negative for cough and shortness of breath.   Cardiovascular: Negative for chest pain and leg swelling.  Gastrointestinal: Positive for constipation. Negative for heartburn, nausea and vomiting.  Genitourinary: Negative for dysuria, flank pain and hematuria.  Musculoskeletal: Positive for joint pain and myalgias.  Skin: Negative for rash.  Neurological: Positive for weakness and headaches.  Psychiatric/Behavioral: Positive for depression. The patient has insomnia.   All other systems reviewed and are negative.  Past Medical History:  Diagnosis Date  . Stroke [facility]) 09/2019   Past Surgical History:  Procedure Laterality Date  . IR ANGIO EXTRACRAN SEL COM CAROTID INNOMINATE UNI L MOD SED  04/20/2020  . IR CT HEAD LTD  04/20/2020  . IR PERCUTANEOUS ART THROMBECTOMY/INFUSION INTRACRANIAL INC DIAG ANGIO  04/20/2020  . IR US GUIDE VASC ACCESS RIGHT  04/20/2020  . RADIOLOGY WITH ANESTHESIA N/A 04/20/2020   Procedure: IR WITH ANESTHESIA;  Surgeon: Luanne Bras, MD;  Location: [facility];  Service: Radiology;  Laterality: N/A;   History reviewed. No pertinent family history. Social History:  has no history on file for tobacco use, alcohol use, and drug use. Allergies: No Known Allergies Medications Prior to Admission  Medication Sig Dispense Refill  . amLODipine (NORVASC) 10 MG tablet Take 10 mg by mouth daily.    Marland Kitchen aspirin EC 81 MG tablet Take 81 mg by mouth daily. Swallow whole.    Marland Kitchen atorvastatin (LIPITOR) 40 MG tablet Take 40 mg by mouth daily.    . budesonide (PULMICORT) 0.5 MG/2ML nebulizer solution Take 0.5 mg by nebulization daily.    . clopidogrel (PLAVIX) 75 MG tablet Take 75 mg  by mouth daily.    Marland Kitchen docusate sodium (COLACE) 100 MG capsule Take 100 mg by mouth daily as needed for mild constipation.    Marland Kitchen escitalopram (LEXAPRO) 10 MG tablet Take 10 mg by mouth daily.     . famotidine (PEPCID) 20 MG tablet Take 20 mg by mouth daily.    . hydrALAZINE (APRESOLINE) 25 MG tablet Take 25 mg by mouth daily.    . metFORMIN (GLUCOPHAGE) 500 MG tablet Take 500 mg by mouth 2 (two) times daily with a meal.      Drug Regimen Review Drug regimen was reviewed and remains appropriate with no significant issues identified  Home: Home Living Family/patient expects to be discharged to:: Inpatient rehab Living Arrangements: Spouse/significant other   Home: Home Living Family/patient expects to be discharged to:: Private residence Living Arrangements: Spouse/significant other Available Help at Discharge: Family,Available 24 hours/day Type of Home: Apartment Home Access: Level entry Home Layout: One level Bathroom Shower/Tub: Chiropodist: Standard Additional Comments: Wife confirming information  Lives With: Alone   Functional History: Prior Function Level of Independence: Independent Comments: Pt performing ADLs  Functional Status:  Mobility: Bed Mobility Overal bed mobility: Needs Assistance Bed Mobility: Supine to Sit Supine to sit: Mod assist,HOB elevated General bed mobility comments: scoot to EOB to reach feet to floor max assist with pt ?giving poor effort vs decr motor planning with RUE Transfers Overall transfer level: Needs assistance Equipment used: None Transfers: Stand Pivot Transfers Stand pivot transfers: Mod assist,+2 physical assistance General transfer comment: blocked Lt knee to come to stand with 2 person assist with use of gait belt; once standing, pt able to maintain nearly full left knee extension in bil LE support however when advancing RLE, left knee supported for safety. pt reaching for far armrest of BSC with RUE appropriately during transfer. Pt wanting to sit on [facility] "a while" and RN in to check on patient and educated on his transfer status for [facility] to recliner. Ambulation/Gait General Gait Details:  unable  ADL:  Cognition: Cognition Overall Cognitive Status: Impaired/Different from baseline Arousal/Alertness: Awake/alert Orientation Level: Oriented X4 Attention: Focused,Sustained Focused Attention: Impaired Focused Attention Impairment: Verbal complex Sustained Attention: Impaired Sustained Attention Impairment: Verbal complex Memory: Impaired Memory Impairment: Retrieval deficit,Decreased recall of new information (Immediate: 5/5; delayed: 4/5; with cue: 1/1) Awareness: Impaired Awareness Impairment: Emergent impairment Problem Solving: Appears intact Executive Function: Sequencing,Organizing Sequencing: Impaired Sequencing Impairment: Verbal complex (Clock drawing: 0/4) Organizing: Impaired Organizing Impairment: Verbal complex (Backward digit span: 1/2 with self-correction) Cognition Arousal/Alertness: Awake/alert Behavior During Therapy: Flat affect Overall Cognitive Status: Impaired/Different from baseline General Comments: slow processing, poor sequencing with extreme difficulty trying to scoot forward in sitting  Physical Exam: Blood pressure (!) 160/90, pulse 84, temperature 98.2 F (36.8 C), temperature source Oral, resp. rate 17, height '5\' 6"'$  (1.676 m), weight 49.2 kg. Physical Exam Gen: no distress, normal appearing HEENT: oral mucosa pink and moist, NCAT Cardio: Reg rate Chest: normal effort, normal rate of breathing Abd: soft, non-distended Ext: no edema Psych: pleasant, normal affect Skin: intact Neurological:     Comments: Patient is alert and oriented x3 and in no acute distress.  Makes eye contact with examiner.  Provides name and age.  Follows commands.  Fair awareness of deficits. 0/5 strength throughout left side, strength intact right side. Sensation intact throughout.     Results for orders placed or performed during the hospital encounter of 04/20/20 (from the past 48 hour(s))  Glucose, capillary  Status: Abnormal   Collection Time:  04/21/20 11:22 PM  Result Value Ref Range   Glucose-Capillary 103 (H) 70 - 99 mg/dL    Comment: Glucose reference range applies only to samples taken after fasting for at least 8 hours.  CBC     Status: Abnormal   Collection Time: 04/22/20  2:54 AM  Result Value Ref Range   WBC 11.6 (H) 4.0 - 10.5 K/uL   RBC 3.95 (L) 4.22 - 5.81 MIL/uL   Hemoglobin 12.4 (L) 13.0 - 17.0 g/dL   HCT 35.8 (L) 39.0 - 52.0 %   MCV 90.6 80.0 - 100.0 fL   MCH 31.4 26.0 - 34.0 pg   MCHC 34.6 30.0 - 36.0 g/dL   RDW 13.5 11.5 - 15.5 %   Platelets 324 150 - 400 K/uL   nRBC 0.0 0.0 - 0.2 %    Comment: Performed at [facility], [place] [address] [place], [place] [id number]  Basic metabolic panel     Status: Abnormal   Collection Time: 04/22/20  2:54 AM  Result Value Ref Range   Sodium 136 135 - 145 mmol/L   Potassium 3.5 3.5 - 5.1 mmol/L   Chloride 105 98 - 111 mmol/L   CO2 23 22 - 32 mmol/L   Glucose, Bld 106 (H) 70 - 99 mg/dL    Comment: Glucose reference range applies only to samples taken after fasting for at least 8 hours.   BUN 15 6 - 20 mg/dL   Creatinine, Ser 2.31 (H) 0.61 - 1.24 mg/dL   Calcium 8.3 (L) 8.9 - 10.3 mg/dL   GFR, Estimated 32 (L) >60 mL/min    Comment: (NOTE) Calculated using the CKD-EPI Creatinine Equation (2021)    Anion gap 8 5 - 15    Comment: Performed at [facility] [place] [address], [place], [place] [address]  Rapid HIV screen (HIV 1/2 Ab+Ag)     Status: None   Collection Time: 04/22/20  2:54 AM  Result Value Ref Range   HIV-1 P24 Antigen - HIV24 NON REACTIVE NON REACTIVE    Comment: (NOTE) Detection of p24 may be inhibited by biotin in the sample, causing false negative results in acute infection.    HIV 1/2 Antibodies NON REACTIVE NON REACTIVE   Interpretation (HIV Ag Ab)      A non reactive test result means that HIV 1 or HIV 2 antibodies and HIV 1 p24 antigen were not detected in the specimen.    Comment: Performed at [facility], [place] [address] [place], [place] [address]  Glucose, capillary     Status: Abnormal   Collection Time: 04/22/20  3:39 AM  Result Value Ref Range   Glucose-Capillary 110 (H) 70 - 99 mg/dL    Comment: Glucose reference range applies only to samples taken after fasting for at least 8 hours.  Glucose, capillary     Status: Abnormal   Collection Time: 04/22/20  7:37 AM  Result Value Ref Range   Glucose-Capillary 100 (H) 70 - 99 mg/dL    Comment: Glucose reference range applies only to samples taken after fasting for at least 8 hours.  Glucose, capillary     Status: Abnormal   Collection Time: 04/22/20 11:21 AM  Result Value Ref Range   Glucose-Capillary 151 (H) 70 - 99 mg/dL    Comment: Glucose reference range applies only to samples taken after fasting for at least 8 hours.  Glucose, capillary  Status: Abnormal   Collection Time: 04/22/20  3:15 PM  Result Value Ref Range   Glucose-Capillary 111 (H) 70 - 99 mg/dL    Comment: Glucose reference range applies only to samples taken after fasting for at least 8 hours.  Glucose, capillary     Status: Abnormal   Collection Time: 04/22/20  8:06 PM  Result Value Ref Range   Glucose-Capillary 226 (H) 70 - 99 mg/dL    Comment: Glucose reference range applies only to samples taken after fasting for at least 8 hours.  Glucose, capillary     Status: None   Collection Time: 04/22/20 11:51 PM  Result Value Ref Range   Glucose-Capillary 97 70 - 99 mg/dL    Comment: Glucose reference range applies only to samples taken after fasting for at least 8 hours.  CBC     Status: Abnormal   Collection Time: 04/23/20  3:45 AM  Result Value Ref Range   WBC 9.2 4.0 - 10.5 K/uL   RBC 4.00 (L) 4.22 - 5.81 MIL/uL   Hemoglobin 12.4 (L) 13.0 - 17.0 g/dL   HCT 37.0 (L) 39.0 - 52.0 %   MCV 92.5 80.0 - 100.0 fL   MCH 31.0 26.0 - 34.0 pg   MCHC 33.5 30.0 - 36.0 g/dL   RDW 13.5 11.5 - 15.5 %   Platelets 311 150 - 400 K/uL   nRBC 0.0 0.0 - 0.2 %    Comment: Performed at [facility], [place] [address], [place], [place] [id number]  Basic metabolic panel     Status: Abnormal   Collection Time: 04/23/20  3:45 AM  Result Value Ref Range   Sodium 138 135 - 145 mmol/L   Potassium 3.4 (L) 3.5 - 5.1 mmol/L   Chloride 108 98 - 111 mmol/L   CO2 24 22 - 32 mmol/L   Glucose, Bld 91 70 - 99 mg/dL    Comment: Glucose reference range applies only to samples taken after fasting for at least 8 hours.   BUN 13 6 - 20 mg/dL   Creatinine, Ser 2.11 (H) 0.61 - 1.24 mg/dL   Calcium 8.4 (L) 8.9 - 10.3 mg/dL   GFR, Estimated 36 (L) >60 mL/min    Comment: (NOTE) Calculated using the CKD-EPI Creatinine Equation (2021)    Anion gap 6 5 - 15    Comment: Performed at [place] [address], [place], [place] [address]  Vitamin B12     Status: None   Collection Time: 04/23/20  3:45 AM  Result Value Ref Range   Vitamin B-12 361 180 - 914 pg/mL    Comment: (NOTE) This assay is not validated for testing neonatal or myeloproliferative syndrome specimens for Vitamin B12 levels. Performed at [facility], [place] [address] [place], [place] [address]   Glucose, capillary     Status: None   Collection Time: 04/23/20  4:01 AM  Result Value Ref Range   Glucose-Capillary 92 70 - 99 mg/dL    Comment: Glucose reference range applies only to samples taken after fasting for at least 8 hours.  Glucose, capillary     Status: None   Collection Time: 04/23/20  6:52 AM  Result Value Ref Range   Glucose-Capillary 90 70 - 99 mg/dL    Comment: Glucose reference range applies only to samples taken after fasting for at least 8 hours.  Glucose, capillary     Status: Abnormal   Collection Time: 04/23/20  8:32  AM  Result Value Ref Range   Glucose-Capillary 134 (H) 70 - 99 mg/dL    Comment: Glucose reference range applies only to samples taken after fasting for at least 8 hours.  Glucose, capillary     Status: Abnormal   Collection Time: 04/23/20 12:19 PM  Result Value Ref Range    Glucose-Capillary 110 (H) 70 - 99 mg/dL    Comment: Glucose reference range applies only to samples taken after fasting for at least 8 hours.  Glucose, capillary     Status: Abnormal   Collection Time: 04/23/20  4:02 PM  Result Value Ref Range   Glucose-Capillary 186 (H) 70 - 99 mg/dL    Comment: Glucose reference range applies only to samples taken after fasting for at least 8 hours.   No results found.     Medical Problem List and Plan: 1.  Left-sided weakness secondary to right MCA distribution infarction with right M2/3 occlusion status post thrombectomy revascularization as well as history of CVA August 2021 with very mild residual deficits  -patient may shower  -ELOS/Goals: minA in 10-14 days 2.  Impaired mobility: -DVT/anticoagulation: SCDs  -antiplatelet therapy: Continue Aspirin 325 mg daily and Plavix 75 mg daily x3 months 3. Pain Management: Tylenol as needed 4. Depression: Continue Lexapro 10 mg daily  -antipsychotic agents: N/A 5. Neuropsych: This patient is capable of making decisions on his own behalf. 6. Skin/Wound Care: Routine skin checks 7. Fluids/Electrolytes/Nutrition: Routine in and outs with follow-up chemistries 8.  Hypertension.  BP has been labile- continue Norvasc 10 mg daily.  Monitor with increased mobility 9.  Diabetes mellitus.  Hemoglobin A1c 7.3.  SSI.  Patient on Glucophage 500 mg twice daily prior to admission.  Resume as needed 10.  Hyperlipidemia.  Lipitor 11.  Polysubstance abuse.  Urine drug screen positive cocaine as well as marijuana.  Provide counseling 12.  CKD stage III.  Follow-up chemistries  I have personally performed a face to face diagnostic evaluation, including, but not limited to relevant history and physical exam findings, of this patient and developed relevant assessment and plan.  Additionally, I have reviewed and concur with the physician assistant's documentation above.  Izora Ribas, MD 04/23/2020   Lavon Paganini Angiulli,  PA-C

## 2020-04-23 NOTE — Progress Notes (Signed)
Inpatient Rehabilitation Medication Review by a Pharmacist  A complete drug regimen review was completed for this patient to identify any potential clinically significant medication issues.  Clinically significant medication issues were identified:  no  Check AMION for pharmacist assigned to patient if future medication questions/issues arise during this admission.  Pharmacist comments:   Time spent performing this drug regimen review (minutes):  [address]   Llana Aliment 04/23/2020 6:47 PM

## 2020-04-23 NOTE — TOC CAGE-AID Note (Signed)
Transition of Care [facility]) - CAGE-AID Screening   Patient Details  Name: Bobby Branch MRN: [id number] Date of Birth: [birth year]  [facility]) CM/SW Contact:    Pollie Friar, RN Phone Number: 04/23/2020, 12:47 PM   Clinical Narrative: Patient states he feels he needs some resources to assist in keeping him from using illicit drugs. CM provided him resources for inpatient/ outpatient counseling.   CAGE-AID Screening:    Have You Ever Felt You Ought to Cut Down on Your Drinking or Drug Use?: Yes Have People Annoyed You By Critizing Your Drinking Or Drug Use?: No Have You Felt Bad Or Guilty About Your Drinking Or Drug Use?: Yes Have You Ever Had a Drink or Used Drugs First Thing In The Morning to Steady Your Nerves or to Get Rid of a Hangover?: No CAGE-AID Score: 2  Substance Abuse Education Offered: Yes  Substance abuse interventions: Scientist, clinical (histocompatibility and immunogenetics)

## 2020-04-23 NOTE — Progress Notes (Signed)
Inpatient Rehabilitation  Patient information reviewed and entered into eRehab system by Melissa M. Bowie, M.A., CCC/SLP, PPS Coordinator.  Information including medical coding, functional ability and quality indicators will be reviewed and updated through discharge.    

## 2020-04-23 NOTE — Plan of Care (Signed)
  Problem: Education: Goal: Knowledge of disease or condition will improve Outcome: Adequate for Discharge Goal: Knowledge of secondary prevention will improve Outcome: Adequate for Discharge Goal: Knowledge of patient specific risk factors addressed and post discharge goals established will improve Outcome: Adequate for Discharge Goal: Individualized Educational Video(s) Outcome: Adequate for Discharge   Problem: Coping: Goal: Will verbalize positive feelings about self Outcome: Adequate for Discharge Goal: Will identify appropriate support needs Outcome: Adequate for Discharge   Problem: Health Behavior/Discharge Planning: Goal: Ability to manage health-related needs will improve Outcome: Adequate for Discharge   Problem: Self-Care: Goal: Ability to participate in self-care as condition permits will improve Outcome: Adequate for Discharge   Problem: Education: Goal: Knowledge of General Education information will improve Description: Including pain rating scale, medication(s)/side effects and non-pharmacologic comfort measures Outcome: Adequate for Discharge   Problem: Health Behavior/Discharge Planning: Goal: Ability to manage health-related needs will improve Outcome: Adequate for Discharge   Problem: Clinical Measurements: Goal: Ability to maintain clinical measurements within normal limits will improve Outcome: Adequate for Discharge Goal: Will remain free from infection Outcome: Adequate for Discharge Goal: Diagnostic test results will improve Outcome: Adequate for Discharge Goal: Respiratory complications will improve Outcome: Adequate for Discharge Goal: Cardiovascular complication will be avoided Outcome: Adequate for Discharge   Problem: Activity: Goal: Risk for activity intolerance will decrease Outcome: Adequate for Discharge   Problem: Nutrition: Goal: Adequate nutrition will be maintained Outcome: Adequate for Discharge   Problem: Coping: Goal:  Level of anxiety will decrease Outcome: Adequate for Discharge   Problem: Elimination: Goal: Will not experience complications related to bowel motility Outcome: Adequate for Discharge Goal: Will not experience complications related to urinary retention Outcome: Adequate for Discharge   Problem: Pain Managment: Goal: General experience of comfort will improve Outcome: Adequate for Discharge   Problem: Safety: Goal: Ability to remain free from injury will improve Outcome: Adequate for Discharge   Problem: Skin Integrity: Goal: Risk for impaired skin integrity will decrease Outcome: Adequate for Discharge

## 2020-04-23 NOTE — Progress Notes (Signed)
Kirsteins, Luanna Salk, MD  Physician  Physical Medicine and Rehabilitation  Consult Note      Signed  Date of Service:  04/23/2020  5:20 AM      Related encounter: ED to Hosp-Admission (Current) from 04/20/2020 in [facility]       Signed      Expand All Collapse All     Show:Clear all '[x]'$ Manual'[x]'$ Template'[]'$ Copied  Added by: '[x]'$ Angiulli, Lavon Paganini, PA-C'[x]'$ Kirsteins, Luanna Salk, MD   '[]'$ Hover for details           Physical Medicine and Rehabilitation Consult Reason for Consult: Left side weakness Referring Physician: Dr.Xu     HPI: Bobby Branch is a 60 y.o. 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  Per chart review patient lives alone modified independent.  Presented 04/20/2020 with acute onset of left-sided weakness.  Cranial CT scan showed asymmetric hyperdensity involving the right M2/M3 branches, concerning for thrombus.  Few remote lacunar infarcts about the bilateral basal ganglia.  CT angiogram of head and neck as well as CT cerebral perfusion scan showed acute 15 cc core infarct involving the posterior right MCA distribution with a 67 cc surrounding ischemic penumbra.  Patient underwent TICI 3 right MCA revascularization per interventional radiology.  MRI follow-up showed right MCA patchy small infarct with possible petechial hemorrhage.  Carotid Dopplers with right 60 to 79%, left ICA 80 to 99% stenosis.  Echocardiogram with ejection fraction of 55 to 60% no wall motion abnormalities grade 1 diastolic dysfunction.  Admission chemistries glucose 231, creatinine 2.10, hemoglobin 13.6, hemoglobin A1c 7.3, urine drug screen positive cocaine as well as marijuana.  Patient initially maintained on Cleviprex for blood pressure control.  Neurology follow-up aspirin 325 mg daily and Plavix 75 mg daily for 3 months.  In regards to patient's  bilateral carotid stenosis Dr. Earleen Newport of interventional radiology to follow-up outpatient for potential revascularization treatment.  Maintain on a regular consistency diet.  Therapy evaluations completed due to patient's left-sided weakness recommendations of physical medicine rehab consult. Patient states that his sister can provide help post discharge. No longer on Cleviprex Review of Systems  Constitutional: Negative for chills and fever.  HENT: Negative for hearing loss.   Eyes: Negative for blurred vision and double vision.  Respiratory: Negative for cough and shortness of breath.   Cardiovascular: Negative for chest pain, palpitations and leg swelling.  Gastrointestinal: Positive for constipation. Negative for heartburn, nausea and vomiting.  Genitourinary: Negative for dysuria and hematuria.  Musculoskeletal: Positive for joint pain and myalgias.  Skin: Negative for rash.  Neurological: Positive for weakness and headaches.  Psychiatric/Behavioral: Positive for depression. The patient has insomnia.   All other systems reviewed and are negative.       Past Medical History:  Diagnosis Date  . Stroke [facility]) 09/2019         Past Surgical History:  Procedure Laterality Date  . IR ANGIO EXTRACRAN SEL COM CAROTID INNOMINATE UNI L MOD SED   04/20/2020  . IR CT HEAD LTD   04/20/2020  . IR PERCUTANEOUS ART THROMBECTOMY/INFUSION INTRACRANIAL INC DIAG ANGIO   04/20/2020  . IR US GUIDE VASC ACCESS RIGHT   04/20/2020    History reviewed. No pertinent family history. Social History:  has no history on file for tobacco use, alcohol use, and drug use. Allergies: No Known Allergies       Medications Prior to Admission  Medication Sig Dispense Refill  . amLODipine (NORVASC) 10 MG tablet Take 10 mg by mouth daily.      Marland Kitchen aspirin EC 81 MG tablet Take 81 mg by mouth daily. Swallow whole.      Marland Kitchen atorvastatin (LIPITOR) 40 MG tablet Take 40 mg by mouth daily.      . budesonide (PULMICORT) 0.5 MG/2ML  nebulizer solution Take 0.5 mg by nebulization daily.      . clopidogrel (PLAVIX) 75 MG tablet Take 75 mg by mouth daily.      Marland Kitchen docusate sodium (COLACE) 100 MG capsule Take 100 mg by mouth daily as needed for mild constipation.      Marland Kitchen escitalopram (LEXAPRO) 10 MG tablet Take 10 mg by mouth daily.      . famotidine (PEPCID) 20 MG tablet Take 20 mg by mouth daily.      . hydrALAZINE (APRESOLINE) 25 MG tablet Take 25 mg by mouth daily.      . metFORMIN (GLUCOPHAGE) 500 MG tablet Take 500 mg by mouth 2 (two) times daily with a meal.          Home: Home Living Family/patient expects to be discharged to:: Unsure Additional Comments: lives alone  Lives With: Alone  Functional History: Prior Function Level of Independence: Independent Functional Status:  Mobility: Bed Mobility Overal bed mobility: Needs Assistance Bed Mobility: Supine to Sit Supine to sit: Mod assist,HOB elevated General bed mobility comments: scoot to EOB to reach feet to floor max assist with pt ?giving poor effort vs decr motor planning with RUE Transfers Overall transfer level: Needs assistance Equipment used: None Transfers: Stand Pivot Transfers Stand pivot transfers: Mod assist,+2 physical assistance General transfer comment: blocked Lt knee to come to stand with 2 person assist with use of gait belt; once standing, pt able to maintain nearly full left knee extension in bil LE support however when advancing RLE, left knee supported for safety. pt reaching for far armrest of BSC with RUE appropriately during transfer. Pt wanting to sit on [facility] "a while" and RN in to check on patient and educated on his transfer status for [facility] to recliner. Ambulation/Gait General Gait Details: unable   ADL:   Cognition: Cognition Overall Cognitive Status: No family/caregiver present to determine baseline cognitive functioning Arousal/Alertness: Awake/alert Orientation Level: Oriented X4 Attention: Focused,Sustained Focused  Attention: Impaired Focused Attention Impairment: Verbal complex Sustained Attention: Impaired Sustained Attention Impairment: Verbal complex Memory: Impaired Memory Impairment: Retrieval deficit,Decreased recall of new information (Immediate: 5/5; delayed: 4/5; with cue: 1/1) Awareness: Impaired Awareness Impairment: Emergent impairment Problem Solving: Appears intact Executive Function: Sequencing,Organizing Sequencing: Impaired Sequencing Impairment: Verbal complex (Clock drawing: 0/4) Organizing: Impaired Organizing Impairment: Verbal complex (Backward digit span: 1/2 with self-correction) Cognition Arousal/Alertness: Awake/alert Behavior During Therapy: Flat affect Overall Cognitive Status: No family/caregiver present to determine baseline cognitive functioning General Comments: slow processing, poor sequencing with extreme difficulty trying to scoot forward in sitting   Blood pressure 140/87, pulse 71, temperature 98.3 F (36.8 C), temperature source Oral, resp. rate 16, height '5\' 6"'$  (1.676 m), weight 62.3 kg, SpO2 95 %. Physical Exam Vitals and nursing note reviewed.  Constitutional:      Appearance: He is normal weight.  HENT:     Head: Normocephalic and atraumatic.  Eyes:     General: No visual field deficit.    Extraocular Movements: Extraocular movements intact.     Conjunctiva/sclera: Conjunctivae normal.     Pupils: Pupils are equal, round, and reactive to light.  Cardiovascular:     Rate and Rhythm: Normal rate and regular rhythm.     Heart sounds: Normal heart sounds. No murmur heard.    Pulmonary:     Effort: Pulmonary effort is normal. No respiratory distress.     Breath sounds: Normal breath sounds. No wheezing.  Abdominal:     General: Abdomen is flat. Bowel sounds are normal. There is no distension.     Palpations: Abdomen is soft.  Musculoskeletal:     Comments: No pain with passive range of motion on the left side or with active range of motion on  the right side No evidence of joint swelling in the hands or wrist no evidence of joint swelling in the feet or ankles or knees  Skin:    General: Skin is warm and dry.  Neurological:     Mental Status: He is alert and oriented to person, place, and time.     Cranial Nerves: No dysarthria.     Sensory: No sensory deficit.     Motor: No tremor.     Gait: Gait abnormal.     Comments: Patient is a bit lethargic but arousable.  Makes eye contact with examiner.  Provides his name and age.  Follows simple commands.  Fair awareness of deficits. Motor strength 0/5 in the left deltoid, bicep, tricep, grip 0/5 in left hip flexor knee extensor ankle dorsiflexor 5/5 in the right deltoid, bicep, tricep, grip 5/5 in the right hip flexor knee extensor ankle dorsiflexor and plantar flexor Sensation intact in the upper and lower limbs to light touch  Psychiatric:        Mood and Affect: Mood normal.        Behavior: Behavior normal.        Lab Results Last 24 Hours        Results for orders placed or performed during the hospital encounter of 04/20/20 (from the past 24 hour(s))  Glucose, capillary     Status: Abnormal    Collection Time: 04/21/20  7:34 PM  Result Value Ref Range    Glucose-Capillary 137 (H) 70 - 99 mg/dL  Glucose, capillary     Status: Abnormal    Collection Time: 04/21/20 11:22 PM  Result Value Ref Range    Glucose-Capillary 103 (H) 70 - 99 mg/dL  CBC     Status: Abnormal    Collection Time: 04/22/20  2:54 AM  Result Value Ref Range    WBC 11.6 (H) 4.0 - 10.5 K/uL    RBC 3.95 (L) 4.22 - 5.81 MIL/uL    Hemoglobin 12.4 (L) 13.0 - 17.0 g/dL    HCT 35.8 (L) 39.0 - 52.0 %    MCV 90.6 80.0 - 100.0 fL    MCH 31.4 26.0 - 34.0 pg    MCHC 34.6 30.0 - 36.0 g/dL    RDW 13.5 11.5 - 15.5 %    Platelets 324 150 - 400 K/uL    nRBC 0.0 0.0 - 0.2 %  Basic metabolic panel     Status: Abnormal    Collection Time: 04/22/20  2:54 AM  Result Value Ref Range    Sodium 136 135 - 145 mmol/L     Potassium 3.5 3.5 - 5.1 mmol/L    Chloride 105 98 - 111 mmol/L    CO2 23 22 - 32 mmol/L    Glucose, Bld 106 (H) 70 - 99 mg/dL    BUN 15 6 - 20 mg/dL    Creatinine, Ser 2.31 (  H) 0.61 - 1.24 mg/dL    Calcium 8.3 (L) 8.9 - 10.3 mg/dL    GFR, Estimated 32 (L) >60 mL/min    Anion gap 8 5 - 15  Rapid HIV screen (HIV 1/2 Ab+Ag)     Status: None    Collection Time: 04/22/20  2:54 AM  Result Value Ref Range    HIV-1 P24 Antigen - HIV24 NON REACTIVE NON REACTIVE    HIV 1/2 Antibodies NON REACTIVE NON REACTIVE    Interpretation (HIV Ag Ab)          A non reactive test result means that HIV 1 or HIV 2 antibodies and HIV 1 p24 antigen were not detected in the specimen.  Glucose, capillary     Status: Abnormal    Collection Time: 04/22/20  3:39 AM  Result Value Ref Range    Glucose-Capillary 110 (H) 70 - 99 mg/dL  Glucose, capillary     Status: Abnormal    Collection Time: 04/22/20  7:37 AM  Result Value Ref Range    Glucose-Capillary 100 (H) 70 - 99 mg/dL  Glucose, capillary     Status: Abnormal    Collection Time: 04/22/20 11:21 AM  Result Value Ref Range    Glucose-Capillary 151 (H) 70 - 99 mg/dL  Glucose, capillary     Status: Abnormal    Collection Time: 04/22/20  3:15 PM  Result Value Ref Range    Glucose-Capillary 111 (H) 70 - 99 mg/dL       Imaging Results (Last 48 hours)  MR BRAIN WO CONTRAST   Result Date: 04/21/2020 CLINICAL DATA:  60 year old male code stroke presentation with right ICA/MCA tandem occlusion, abnormal CT Perfusion. Status post Neurointerventional endovascular reperfusion. EXAM: MRI HEAD WITHOUT CONTRAST TECHNIQUE: Multiplanar, multiecho pulse sequences of the brain and surrounding structures were obtained without intravenous contrast. COMPARISON:  CTA head, CTP and NIR 04/20/2020. FINDINGS: Study is intermittently degraded by motion artifact despite repeated imaging attempts. Brain: Patchy and confluent restricted diffusion in the right ACA and right MCA  territory (series 7, image 51). Cortical and subcortical white matter involvement. Involvement of the posterior right insula. Scattered additional periventricular and white matter small foci of restricted diffusion in the right hemisphere including some of the anterior occipital lobe. The most confluent area of right MCA involvement in the posterior MCA region is similar to the predicted infarct core by CTP (series 5, image 87 today). And some of the posterior right ACA involvement was also predicted by CTP. No contralateral left cerebral hemisphere restricted diffusion but there is a small focus of diffusion restriction in the central right cerebellum on series 5, image 59. Cytotoxic edema. Possible petechial hemorrhage right posterosuperior temporal gyrus (series 12, image 9). No other acute intracranial hemorrhage identified. Possible chronic microhemorrhage anterior right inferior frontal gyrus series 12, image 13. No intracranial mass effect. No ventriculomegaly. Chronic appearing lacunar infarcts in the bilateral deep gray matter nuclei, the right cerebellar peduncle, the bilateral ventral pons, and bilateral cerebellum. Cervicomedullary junction and pituitary are within normal limits. Vascular: Major intracranial vascular flow voids are preserved. Skull and upper cervical spine: Negative. Sinuses/Orbits: Stable. Chronic postoperative changes to the left orbital walls. Other: Trace mastoid fluid. IMPRESSION: 1. Patchy and confluent acute infarcts in the right ACA and right MCA territory. The most confluent appearing similar to predicted infarct core by CTP. Possible petechial blood, Heidelberg class 1a, right superior temporal gyrus. No significant intracranial mass effect. 2. Punctate acute infarct also in the right cerebellum.  3. Chronic lacunar infarcts in the bilateral deep gray nuclei, bilateral brainstem and cerebellum. Electronically Signed   By: Genevie Ann M.D.   On: 04/21/2020 05:59    VAS US CAROTID    Result Date: 04/21/2020 Carotid Arterial Duplex Study Indications:       CVA, Numbness, Weakness and CTA of neck showed right ICA                    occlusion and left ICA string sign at bifurcation. Risk Factors:      Hypertension, hyperlipidemia, Diabetes, prior CVA. Other Factors:     Polysubstance abuse. Limitations        Today's exam was limited due to the patient's inability or                    unwillingness to cooperate. Comparison Study:  No prior study on file Performing Technologist: Sharion Dove RVS  Examination Guidelines: A complete evaluation includes B-mode imaging, spectral Doppler, color Doppler, and power Doppler as needed of all accessible portions of each vessel. Bilateral testing is considered an integral part of a complete examination. Limited examinations for reoccurring indications may be performed as noted.  Right Carotid Findings: +----------+--------+--------+--------+------------------+------------------+           PSV cm/sEDV cm/sStenosisPlaque DescriptionComments           +----------+--------+--------+--------+------------------+------------------+ CCA Prox  118     17                                intimal thickening +----------+--------+--------+--------+------------------+------------------+ CCA Distal83      12                                                   +----------+--------+--------+--------+------------------+------------------+ ICA Prox  130     29              heterogenous                         +----------+--------+--------+--------+------------------+------------------+ ICA Mid   409     107     60-79%                                       +----------+--------+--------+--------+------------------+------------------+ ICA Distal107     29                                                   +----------+--------+--------+--------+------------------+------------------+ ECA       183     36                                                    +----------+--------+--------+--------+------------------+------------------+ +----------+--------+-------+--------+-------------------+           PSV cm/sEDV cmsDescribeArm Pressure (mmHG) +----------+--------+-------+--------+-------------------+ [id number]                                        +----------+--------+-------+--------+-------------------+ +---------+--------+--+--------+--+  VertebralPSV cm/s63EDV cm/s25 +---------+--------+--+--------+--+  Left Carotid Findings: +----------+--------+--------+--------+------------------+--------------+           PSV cm/sEDV cm/sStenosisPlaque DescriptionComments       +----------+--------+--------+--------+------------------+--------------+ CCA Prox  128     11              heterogenous                     +----------+--------+--------+--------+------------------+--------------+ CCA Distal111     18              heterogenous                     +----------+--------+--------+--------+------------------+--------------+ ICA Prox  391     121     80-99%  heterogenous                     +----------+--------+--------+--------+------------------+--------------+ ICA Distal                                          Not visualized +----------+--------+--------+--------+------------------+--------------+ ECA       183     21                                               +----------+--------+--------+--------+------------------+--------------+ +---------+--------+--+--------+--+ VertebralPSV cm/s83EDV cm/s22 +---------+--------+--+--------+--+   Summary: Right Carotid: Velocities in the right ICA are consistent with a 60-79%                stenosis. Left Carotid: Velocities in the left ICA are consistent with a 80-99% stenosis. Vertebrals:  Bilateral vertebral arteries demonstrate antegrade flow. Subclavians: Normal flow hemodynamics were seen in bilateral subclavian              arteries.  *See table(s) above for measurements and observations.     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 Does the need for close, 24 hr/day medical supervision in concert with the patient's rehab needs make it unreasonable for this patient to be served in a less intensive setting? Potentially 2. Co-Morbidities requiring supervision/potential complications: History of substance abuse, positive urinary retention, diabetes, hypertension 3. Due to bladder management, bowel management, safety, skin/wound care, disease management, medication administration, pain management and patient education, does the patient require 24 hr/day rehab nursing? Potentially 4. Does the patient require coordinated care of a physician, rehab nurse, therapy disciplines of PT, OT, to address physical and functional deficits in the context of the above medical diagnosis(es)? Yes Addressing deficits in the following areas: balance, endurance, locomotion, strength, transferring, bowel/bladder control, bathing, dressing, feeding, toileting and psychosocial support 5. Can the patient actively participate in an intensive therapy program of at least 3 hrs of therapy per day at least 5 days per week? Yes 6. The potential for patient to make measurable gains while on inpatient rehab is good 7. Anticipated functional outcomes upon discharge from inpatient rehab are supervision  with PT, supervision with OT, n/a with SLP. 8. Estimated rehab length of stay to reach the above functional goals is: 10 to 14 days 9. Anticipated discharge destination: Home 10. Overall Rehab/Functional Prognosis: good   RECOMMENDATIONS: This patient's condition is appropriate for continued rehabilitative care in the following  setting: CIR Patient has agreed to participate in recommended program. Potentially Note that insurance prior authorization may be required for reimbursement for recommended  care.   Comment: Patient indicates that sister can assist post discharge.  OT eval is pending   Cathlyn Parsons, PA-C 04/22/2020    "I have personally performed a face to face diagnostic evaluation of this patient.  Additionally, I have reviewed and concur with the physician assistant's documentation above." Charlett Blake M.D. [employer] Fellow Am [employer] of [employer] and Rehab Diplomate Am Board of Electrodiagnostic Med Fellow Am Board of Interventional Pain              Revision History                        Routing History              Note Details  Author Charlett Blake, MD File Time 04/23/2020 10:40 AM  Author Type Physician Status Signed  Last Editor Charlett Blake, MD Service Physical Medicine and Rehabilitation

## 2020-04-23 NOTE — Progress Notes (Signed)
Inpatient Rehabilitation Admissions Coordinator  I met at bedside with patient and girlfriend, Chastity. We discussed goals and expectations of a possible CIR admit. We also discussed daily cost of care and he was provided Estimated daily cost . They are in agreement to admit. I contacted Dr. Erlinda Hong and we will arrange d/c to CIR today.  Danne Baxter, RN, MSN Rehab Admissions Coordinator [phone] 04/23/2020 12:13 PM

## 2020-04-23 NOTE — Progress Notes (Signed)
Patient arrived on unit, oriented to unit. Reviewed medications, therapy schedule, rehab routine and plan of care. States an understanding of information reviewed. No complications noted at this time. Patient reports no pain Bobby Branch [name]  

## 2020-04-23 NOTE — Progress Notes (Signed)
Physical Therapy Treatment Patient Details Name: Bobby Branch MRN: [id number] DOB: 1960/05/28 Today's Date: 04/23/2020    History of Present Illness 60 y.o. 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 s/p emergent cerebral angiogram with  successful thrombectomy with small volume SAH sylvian sulci; cocaine +    PT Comments    Focused session on gait training with pt demonstrating progress. Pt with tendency to lean to L, displaying poor motor planing to weight shift to each leg to allow contralateral leg to advance with a step. Thus, performed pre-gait weight shifting training laterally between each leg. Pt with improved weight shift to R, allowing for L leg to lift. Pt needing extensive assistance to advance L leg and place it when stepping and L knee blocking when in stance phase due to weakness. Pt only able to ambulate up to ~3 ft with modAx2 using a RW before fatiguing at this time. Will continue to follow acutely. Current recommendations remain appropriate.    Follow Up Recommendations  CIR     Equipment Recommendations  Rolling walker with 5" wheels;Wheelchair (measurements PT);Wheelchair cushion (measurements PT)    Recommendations for Other Services       Precautions / Restrictions Precautions Precautions: Fall Restrictions Weight Bearing Restrictions: No    Mobility  Bed Mobility Overal bed mobility: Needs Assistance Bed Mobility: Supine to Sit;Sit to Supine     Supine to sit: Mod assist;HOB elevated Sit to supine: Mod assist   General bed mobility comments: Cued pt to bring legs to EOB with him attempting to hook L with R foot. Mod A for bringing hips towards EOB and to elevate trunk. ModA to manage L leg back into supine.    Transfers Overall transfer level: Needs assistance Equipment used: Rolling  walker (2 wheeled) Transfers: Stand Pivot Transfers;Sit to/from Stand Sit to Stand: Min assist;Mod assist;+2 physical assistance;+2 safety/equipment Stand pivot transfers: Mod assist;Min assist;+2 physical assistance;+2 safety/equipment       General transfer comment: Pt coming to stand 2x from EOB and 1x from recliner, needing modA on L to block L knee and assist with trunk extension to power up to stand and minA on R. ModAx1 with minAx1 to direct buttocks, provide support, and block L knee for stand step to R chair > bed.  Ambulation/Gait Ambulation/Gait assistance: Mod assist;+2 physical assistance;+2 safety/equipment Gait Distance (Feet): 3 Feet (x3 bouts of ~1 ft > ~ 70f > ~1 ft) Assistive device: Rolling walker (2 wheeled) Gait Pattern/deviations: Decreased step length - right;Decreased step length - left;Decreased stride length;Decreased weight shift to right;Decreased weight shift to left;Trunk flexed Gait velocity: reduced Gait velocity interpretation: <1.31 ft/sec, indicative of household ambulator General Gait Details: Pt with trunk flexion, needing repeated cues to improve posture with UEs on RW and modAx2 for stability and physical assistance to shift weight and advance L leg. Also, performed pre-gait training with lateral weight shifting in place. L knee block with L stance otherwise knee buckles. Minimal activation noted to advance L leg, needing maxA to direct and place it with steps.   Stairs             Wheelchair Mobility    Modified Rankin (Stroke Patients Only) Modified Rankin (Stroke Patients Only) Pre-Morbid Rankin Score: No significant disability Modified Rankin: Severe disability     Balance Overall balance assessment: Needs assistance  Sitting-balance support: Feet supported;Bilateral upper extremity supported Sitting balance-Leahy Scale: Poor Sitting balance - Comments: Pt fluctuating between needing min guard-modA, possibly due to lethargy as pt was  sleeping upon arrival. Postural control: Left lateral lean Standing balance support: During functional activity;Bilateral upper extremity supported Standing balance-Leahy Scale: Poor Standing balance comment: Min-modAx2 with bil UEs on RW for static standing balance, L knee block.                            Cognition Arousal/Alertness: Awake/alert Behavior During Therapy: Flat affect Overall Cognitive Status: Impaired/Different from baseline Area of Impairment: Problem solving                             Problem Solving: Slow processing;Difficulty sequencing;Requires verbal cues;Requires tactile cues General Comments: slow processing, poor sequencing, neding max multi-modal cues for gait training.      Exercises      General Comments        Pertinent Vitals/Pain Pain Assessment: Faces Faces Pain Scale: No hurt Pain Intervention(s): Monitored during session    Home Living                      Prior Function            PT Goals (current goals can now be found in the care plan section) Acute Rehab PT Goals Patient Stated Goal: improve and go to CIR today PT Goal Formulation: With patient/family Time For Goal Achievement: 05/06/20 Potential to Achieve Goals: Good Progress towards PT goals: Progressing toward goals    Frequency    Min 4X/week      PT Plan Current plan remains appropriate    Co-evaluation              AM-PAC PT "6 Clicks" Mobility   Outcome Measure  Help needed turning from your back to your side while in a flat bed without using bedrails?: A Lot Help needed moving from lying on your back to sitting on the side of a flat bed without using bedrails?: A Lot Help needed moving to and from a bed to a chair (including a wheelchair)?: A Lot Help needed standing up from a chair using your arms (e.g., wheelchair or bedside chair)?: A Lot Help needed to walk in hospital room?: A Lot Help needed climbing 3-5 steps  with a railing? : Total 6 Click Score: 11    End of Session Equipment Utilized During Treatment: Gait belt Activity Tolerance: Patient tolerated treatment well Patient left: in bed;with call bell/phone within reach;with bed alarm set;with family/visitor present   PT Visit Diagnosis: Muscle weakness (generalized) (M62.81);Hemiplegia and hemiparesis;Unsteadiness on feet (R26.81);Other abnormalities of gait and mobility (R26.89);Difficulty in walking, not elsewhere classified (R26.2);Other symptoms and signs involving the nervous system (R29.898) Hemiplegia - Right/Left: Left Hemiplegia - dominant/non-dominant: Non-dominant Hemiplegia - caused by: Cerebral infarction     Time: [id number] PT Time Calculation (min) (ACUTE ONLY): 22 min  Charges:  $Gait Training: 8-22 mins                     Moishe Spice, PT, DPT Acute Rehabilitation Services  Pager: [phone] Office: [place] 04/23/2020, 5:52 PM

## 2020-04-23 NOTE — H&P (Incomplete)
Physical Medicine and Rehabilitation Admission H&P    Chief Complaint  Patient presents with  . 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  Per chart review lives with his girlfriend and modified independent.    Presented 04/20/2020 with acute onset of left-sided weakness.  Cranial CT scan showed asymmetric hyperdensity involving the right M2/M3 branches concerning for thrombus.  Few remote lacunar infarcts about the bilateral basal ganglia.  CT angiogram of head and neck as well as CT cerebral perfusion scan showed acute 15 cc core infarct involving the posterior right MCA distribution with a 67 cc surrounding ischemic penumbra.  Patient underwent TICI 3 right MCA revascularization per interventional radiology.  MRI follow-up showed right MCA patchy small acute infarct with possible petechial hemorrhage.  Carotid Dopplers with right 60 to 79% left ICA 80 to 99% stenosis.  Echocardiogram with ejection fraction of 55 to 60% no wall motion abnormalities grade 1 diastolic dysfunction.  Admission chemistries glucose 231 creatinine 2.10 hemoglobin 13.6 hemoglobin A1c 7.3 urine drug screen positive cocaine as well as marijuana.  Initially maintained on Cleviprex for blood pressure control.  Neurology follow-up presently on aspirin 325 mg daily and Plavix 75 mg daily x3 months.  In regards to patient's bilateral carotid stenosis Dr. Earleen Newport of interventional radiology to follow-up outpatient for potential revascularization treatment.  He is tolerating a regular consistency diet.  Therapy evaluations completed due to patient's left-sided weakness recommendations of physical medicine rehab consult patient was admitted for a comprehensive rehab program.  Review of Systems  Constitutional: Negative for chills and fever.   HENT: Negative for hearing loss.   Eyes: Negative for blurred vision and double vision.  Respiratory: Negative for cough and shortness of breath.   Cardiovascular: Negative for chest pain and leg swelling.  Gastrointestinal: Positive for constipation. Negative for heartburn, nausea and vomiting.  Genitourinary: Negative for dysuria, flank pain and hematuria.  Musculoskeletal: Positive for joint pain and myalgias.  Skin: Negative for rash.  Neurological: Positive for weakness and headaches.  Psychiatric/Behavioral: Positive for depression. The patient has insomnia.   All other systems reviewed and are negative.  Past Medical History:  Diagnosis Date  . Stroke [facility]) 09/2019   Past Surgical History:  Procedure Laterality Date  . IR ANGIO EXTRACRAN SEL COM CAROTID INNOMINATE UNI L MOD SED  04/20/2020  . IR CT HEAD LTD  04/20/2020  . IR PERCUTANEOUS ART THROMBECTOMY/INFUSION INTRACRANIAL INC DIAG ANGIO  04/20/2020  . IR US GUIDE VASC ACCESS RIGHT  04/20/2020  . RADIOLOGY WITH ANESTHESIA N/A 04/20/2020   Procedure: IR WITH ANESTHESIA;  Surgeon: Luanne Bras, MD;  Location: [facility];  Service: Radiology;  Laterality: N/A;   History reviewed. No pertinent family history. Social History:  has no history on file for tobacco use, alcohol use, and drug use. Allergies: No Known Allergies Medications Prior to Admission  Medication Sig Dispense Refill  . amLODipine (NORVASC) 10 MG tablet Take 10 mg by mouth daily.    Marland Kitchen aspirin EC 81 MG tablet Take 81 mg by mouth daily. Swallow whole.    Marland Kitchen atorvastatin (LIPITOR) 40 MG tablet Take 40 mg by mouth daily.    . budesonide (PULMICORT) 0.5 MG/2ML nebulizer solution Take 0.5 mg by nebulization daily.    . clopidogrel (PLAVIX) 75 MG tablet Take 75 mg by mouth  daily.    . docusate sodium (COLACE) 100 MG capsule Take 100 mg by mouth daily as needed for mild constipation.    Marland Kitchen escitalopram (LEXAPRO) 10 MG tablet Take 10 mg by mouth daily.    . famotidine  (PEPCID) 20 MG tablet Take 20 mg by mouth daily.    . hydrALAZINE (APRESOLINE) 25 MG tablet Take 25 mg by mouth daily.    . metFORMIN (GLUCOPHAGE) 500 MG tablet Take 500 mg by mouth 2 (two) times daily with a meal.      Drug Regimen Review Drug regimen was reviewed and remains appropriate with no significant issues identified  Home: Home Living Family/patient expects to be discharged to:: Private residence Living Arrangements: Spouse/significant other Available Help at Discharge: Family,Available 24 hours/day Type of Home: Apartment Home Access: Level entry Home Layout: One level Bathroom Shower/Tub: Chiropodist: Standard Additional Comments: Wife confirming information  Lives With: Alone   Functional History: Prior Function Level of Independence: Independent Comments: Pt performing ADLs  Functional Status:  Mobility: Bed Mobility Overal bed mobility: Needs Assistance Bed Mobility: Supine to Sit Supine to sit: Mod assist,HOB elevated General bed mobility comments: scoot to EOB to reach feet to floor max assist with pt ?giving poor effort vs decr motor planning with RUE Transfers Overall transfer level: Needs assistance Equipment used: None Transfers: Stand Pivot Transfers Stand pivot transfers: Mod assist,+2 physical assistance General transfer comment: blocked Lt knee to come to stand with 2 person assist with use of gait belt; once standing, pt able to maintain nearly full left knee extension in bil LE support however when advancing RLE, left knee supported for safety. pt reaching for far armrest of BSC with RUE appropriately during transfer. Pt wanting to sit on [facility] "a while" and RN in to check on patient and educated on his transfer status for [facility] to recliner. Ambulation/Gait General Gait Details: unable    ADL:    Cognition: Cognition Overall Cognitive Status: Impaired/Different from baseline Arousal/Alertness: Awake/alert Orientation Level:  Oriented X4 Attention: Focused,Sustained Focused Attention: Impaired Focused Attention Impairment: Verbal complex Sustained Attention: Impaired Sustained Attention Impairment: Verbal complex Memory: Impaired Memory Impairment: Retrieval deficit,Decreased recall of new information (Immediate: 5/5; delayed: 4/5; with cue: 1/1) Awareness: Impaired Awareness Impairment: Emergent impairment Problem Solving: Appears intact Executive Function: Sequencing,Organizing Sequencing: Impaired Sequencing Impairment: Verbal complex (Clock drawing: 0/4) Organizing: Impaired Organizing Impairment: Verbal complex (Backward digit span: 1/2 with self-correction) Cognition Arousal/Alertness: Awake/alert Behavior During Therapy: Flat affect Overall Cognitive Status: Impaired/Different from baseline General Comments: slow processing, poor sequencing with extreme difficulty trying to scoot forward in sitting  Physical Exam: Blood pressure 134/76, pulse 70, temperature 98.7 F (37.1 C), temperature source Oral, resp. rate 16, height '5\' 6"'$  (1.676 m), weight 62.3 kg, SpO2 98 %. Physical Exam Neurological:     Comments: Patient is alert in no acute distress.  Makes eye contact with examiner.  Provides name and age.  Follows commands.  Fair awareness of deficits.     Results for orders placed or performed during the hospital encounter of 04/20/20 (from the past 48 hour(s))  Glucose, capillary     Status: Abnormal   Collection Time: 04/21/20 11:42 AM  Result Value Ref Range   Glucose-Capillary 145 (H) 70 - 99 mg/dL    Comment: Glucose reference range applies only to samples taken after fasting for at least 8 hours.  Glucose, capillary     Status: Abnormal   Collection Time: 04/21/20  3:30 PM  Result  Value Ref Range   Glucose-Capillary 106 (H) 70 - 99 mg/dL    Comment: Glucose reference range applies only to samples taken after fasting for at least 8 hours.  Glucose, capillary     Status: Abnormal    Collection Time: 04/21/20  7:34 PM  Result Value Ref Range   Glucose-Capillary 137 (H) 70 - 99 mg/dL    Comment: Glucose reference range applies only to samples taken after fasting for at least 8 hours.  Glucose, capillary     Status: Abnormal   Collection Time: 04/21/20 11:22 PM  Result Value Ref Range   Glucose-Capillary 103 (H) 70 - 99 mg/dL    Comment: Glucose reference range applies only to samples taken after fasting for at least 8 hours.  CBC     Status: Abnormal   Collection Time: 04/22/20  2:54 AM  Result Value Ref Range   WBC 11.6 (H) 4.0 - 10.5 K/uL   RBC 3.95 (L) 4.22 - 5.81 MIL/uL   Hemoglobin 12.4 (L) 13.0 - 17.0 g/dL   HCT 35.8 (L) 39.0 - 52.0 %   MCV 90.6 80.0 - 100.0 fL   MCH 31.4 26.0 - 34.0 pg   MCHC 34.6 30.0 - 36.0 g/dL   RDW 13.5 11.5 - 15.5 %   Platelets 324 150 - 400 K/uL   nRBC 0.0 0.0 - 0.2 %    Comment: Performed at [facility], [place] [address], [place], [place] [id number]  Basic metabolic panel     Status: Abnormal   Collection Time: 04/22/20  2:54 AM  Result Value Ref Range   Sodium 136 135 - 145 mmol/L   Potassium 3.5 3.5 - 5.1 mmol/L   Chloride 105 98 - 111 mmol/L   CO2 23 22 - 32 mmol/L   Glucose, Bld 106 (H) 70 - 99 mg/dL    Comment: Glucose reference range applies only to samples taken after fasting for at least 8 hours.   BUN 15 6 - 20 mg/dL   Creatinine, Ser 2.31 (H) 0.61 - 1.24 mg/dL   Calcium 8.3 (L) 8.9 - 10.3 mg/dL   GFR, Estimated 32 (L) >60 mL/min    Comment: (NOTE) Calculated using the CKD-EPI Creatinine Equation (2021)    Anion gap 8 5 - 15    Comment: Performed at [facility] [address] [place], [place] [address]  Rapid HIV screen (HIV 1/2 Ab+Ag)     Status: None   Collection Time: 04/22/20  2:54 AM  Result Value Ref Range   HIV-1 P24 Antigen - HIV24 NON REACTIVE NON REACTIVE    Comment: (NOTE) Detection of p24 may be inhibited by biotin in the sample, causing false negative results in acute infection.     HIV 1/2 Antibodies NON REACTIVE NON REACTIVE   Interpretation (HIV Ag Ab)      A non reactive test result means that HIV 1 or HIV 2 antibodies and HIV 1 p24 antigen were not detected in the specimen.    Comment: Performed at [facility], [place] [address], [place], [place] [address]  Glucose, capillary     Status: Abnormal   Collection Time: 04/22/20  3:39 AM  Result Value Ref Range   Glucose-Capillary 110 (H) 70 - 99 mg/dL    Comment: Glucose reference range applies only to samples taken after fasting for at least 8 hours.  Glucose, capillary     Status: Abnormal   Collection Time: 04/22/20  7:37 AM  Result Value Ref Range   Glucose-Capillary 100 (H) 70 - 99 mg/dL    Comment: Glucose reference range applies only to samples taken after fasting for at least 8 hours.  Glucose, capillary     Status: Abnormal   Collection Time: 04/22/20 11:21 AM  Result Value Ref Range   Glucose-Capillary 151 (H) 70 - 99 mg/dL    Comment: Glucose reference range applies only to samples taken after fasting for at least 8 hours.  Glucose, capillary     Status: Abnormal   Collection Time: 04/22/20  3:15 PM  Result Value Ref Range   Glucose-Capillary 111 (H) 70 - 99 mg/dL    Comment: Glucose reference range applies only to samples taken after fasting for at least 8 hours.  Glucose, capillary     Status: Abnormal   Collection Time: 04/22/20  8:06 PM  Result Value Ref Range   Glucose-Capillary 226 (H) 70 - 99 mg/dL    Comment: Glucose reference range applies only to samples taken after fasting for at least 8 hours.  Glucose, capillary     Status: None   Collection Time: 04/22/20 11:51 PM  Result Value Ref Range   Glucose-Capillary 97 70 - 99 mg/dL    Comment: Glucose reference range applies only to samples taken after fasting for at least 8 hours.  CBC     Status: Abnormal   Collection Time: 04/23/20  3:45 AM  Result Value Ref Range   WBC 9.2 4.0 - 10.5 K/uL   RBC 4.00 (L) 4.22 - 5.81 MIL/uL    Hemoglobin 12.4 (L) 13.0 - 17.0 g/dL   HCT 37.0 (L) 39.0 - 52.0 %   MCV 92.5 80.0 - 100.0 fL   MCH 31.0 26.0 - 34.0 pg   MCHC 33.5 30.0 - 36.0 g/dL   RDW 13.5 11.5 - 15.5 %   Platelets 311 150 - 400 K/uL   nRBC 0.0 0.0 - 0.2 %    Comment: Performed at [facility], [place] [address] [place], [place] [id number]  Basic metabolic panel     Status: Abnormal   Collection Time: 04/23/20  3:45 AM  Result Value Ref Range   Sodium 138 135 - 145 mmol/L   Potassium 3.4 (L) 3.5 - 5.1 mmol/L   Chloride 108 98 - 111 mmol/L   CO2 24 22 - 32 mmol/L   Glucose, Bld 91 70 - 99 mg/dL    Comment: Glucose reference range applies only to samples taken after fasting for at least 8 hours.   BUN 13 6 - 20 mg/dL   Creatinine, Ser 2.11 (H) 0.61 - 1.24 mg/dL   Calcium 8.4 (L) 8.9 - 10.3 mg/dL   GFR, Estimated 36 (L) >60 mL/min    Comment: (NOTE) Calculated using the CKD-EPI Creatinine Equation (2021)    Anion gap 6 5 - 15    Comment: Performed at [facility] [address] [place], [place] [address]  Vitamin B12     Status: None   Collection Time: 04/23/20  3:45 AM  Result Value Ref Range   Vitamin B-12 361 180 - 914 pg/mL    Comment: (NOTE) This assay is not validated for testing neonatal or myeloproliferative syndrome specimens for Vitamin B12 levels. Performed at [facility], [place] [address], [place], [place] [address]   Glucose, capillary     Status: None   Collection Time: 04/23/20  4:01 AM  Result Value Ref Range   Glucose-Capillary 92  70 - 99 mg/dL    Comment: Glucose reference range applies only to samples taken after fasting for at least 8 hours.  Glucose, capillary     Status: None   Collection Time: 04/23/20  6:52 AM  Result Value Ref Range   Glucose-Capillary 90 70 - 99 mg/dL    Comment: Glucose reference range applies only to samples taken after fasting for at least 8 hours.  Glucose, capillary     Status: Abnormal   Collection Time: 04/23/20  8:32 AM  Result Value  Ref Range   Glucose-Capillary 134 (H) 70 - 99 mg/dL    Comment: Glucose reference range applies only to samples taken after fasting for at least 8 hours.   VAS US CAROTID  Result Date: 04/21/2020 Carotid Arterial Duplex Study Indications:       CVA, Numbness, Weakness and CTA of neck showed right ICA                    occlusion and left ICA string sign at bifurcation. Risk Factors:      Hypertension, hyperlipidemia, Diabetes, prior CVA. Other Factors:     Polysubstance abuse. Limitations        Today's exam was limited due to the patient's inability or                    unwillingness to cooperate. Comparison Study:  No prior study on file Performing Technologist: Sharion Dove RVS  Examination Guidelines: A complete evaluation includes B-mode imaging, spectral Doppler, color Doppler, and power Doppler as needed of all accessible portions of each vessel. Bilateral testing is considered an integral part of a complete examination. Limited examinations for reoccurring indications may be performed as noted.  Right Carotid Findings: +----------+--------+--------+--------+------------------+------------------+           PSV cm/sEDV cm/sStenosisPlaque DescriptionComments           +----------+--------+--------+--------+------------------+------------------+ CCA Prox  118     17                                intimal thickening +----------+--------+--------+--------+------------------+------------------+ CCA Distal83      12                                                   +----------+--------+--------+--------+------------------+------------------+ ICA Prox  130     29              heterogenous                         +----------+--------+--------+--------+------------------+------------------+ ICA Mid   409     107     60-79%                                       +----------+--------+--------+--------+------------------+------------------+ ICA Distal107     29                                                    +----------+--------+--------+--------+------------------+------------------+ ECA       183  36                                                   +----------+--------+--------+--------+------------------+------------------+ +----------+--------+-------+--------+-------------------+           PSV cm/sEDV cmsDescribeArm Pressure (mmHG) +----------+--------+-------+--------+-------------------+ [id number]                                        +----------+--------+-------+--------+-------------------+ +---------+--------+--+--------+--+ VertebralPSV cm/s63EDV cm/s25 +---------+--------+--+--------+--+  Left Carotid Findings: +----------+--------+--------+--------+------------------+--------------+           PSV cm/sEDV cm/sStenosisPlaque DescriptionComments       +----------+--------+--------+--------+------------------+--------------+ CCA Prox  128     11              heterogenous                     +----------+--------+--------+--------+------------------+--------------+ CCA Distal111     18              heterogenous                     +----------+--------+--------+--------+------------------+--------------+ ICA Prox  391     121     80-99%  heterogenous                     +----------+--------+--------+--------+------------------+--------------+ ICA Distal                                          Not visualized +----------+--------+--------+--------+------------------+--------------+ ECA       183     21                                               +----------+--------+--------+--------+------------------+--------------+ +---------+--------+--+--------+--+ VertebralPSV cm/s83EDV cm/s22 +---------+--------+--+--------+--+   Summary: Right Carotid: Velocities in the right ICA are consistent with a 60-79%                stenosis. Left Carotid: Velocities in the left ICA are consistent with a 80-99%  stenosis. Vertebrals:  Bilateral vertebral arteries demonstrate antegrade flow. Subclavians: Normal flow hemodynamics were seen in bilateral subclavian              arteries. *See table(s) above for measurements and observations.     Preliminary        Medical Problem List and Plan: 1.  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  Antithrombotics: -DVT/anticoagulation: SCDs  -antiplatelet therapy: Aspirin 325 mg daily and Plavix 75 mg daily x3 months 3. Pain Management: Tylenol as needed 4. Mood: Lexapro 10 mg daily  -antipsychotic agents: N/A 5. Neuropsych: This patient is capable of making decisions on his own behalf. 6. Skin/Wound Care: Routine skin checks 7. Fluids/Electrolytes/Nutrition: Routine in and outs with follow-up chemistries 8.  Hypertension.  Norvasc 10 mg daily.  Monitor with increased mobility 9.  Diabetes mellitus.  Hemoglobin A1c 7.3.  SSI.  Patient on Glucophage 500 mg twice daily prior to admission.  Resume as needed 10.  Hyperlipidemia.  Lipitor 11.  Polysubstance abuse.  Urine drug screen positive cocaine as well as marijuana.  Provide counseling 12.  CKD stage III.  Follow-up chemistries ***  Cathlyn Parsons, PA-C 04/23/2020

## 2020-04-23 NOTE — TOC Initial Note (Signed)
Transition of Care [facility]) - Initial/Assessment Note    Patient Details  Name: Bobby Branch MRN: [id number] Date of Birth: 08/11/1960  Transition of [facility]) CM/SW Contact:    Pollie Friar, RN Phone Number: 04/23/2020, 11:00 AM  Clinical Narrative:                 Patient from home alone but family at the bedside says he will have 24 hour support at d/c.  Pt has recently moved here from [place] and has not arranged a PcP yet. He is interested in one of the [facility]. CM will follow to arrange closer to d/c.  Pt/family deny issues with transportation.  No DME at home. Pt requesting to see about applying for medicaid. CM has sent [facility] an email to please reach out to the patient.  Recommendations are for CIR. CM following.   Expected Discharge Plan: IP Rehab Facility Barriers to Discharge: Continued Medical Work up,Inadequate or no insurance   Patient Goals and CMS Choice   CMS Medicare.gov Compare Post Acute Care list provided to:: Patient Choice offered to / list presented to : Patient  Expected Discharge Plan and Services Expected Discharge Plan: [place]   Discharge Planning Services: CM Consult Post Acute Care Choice: IP Rehab Living arrangements for the past 2 months: Apartment                                      Prior Living Arrangements/Services Living arrangements for the past 2 months: Apartment Lives with:: Self Patient language and need for interpreter reviewed:: Yes Do you feel safe going back to the place where you live?: Yes      Need for Family Participation in Patient Care: Yes (Comment) Care giver support system in place?: Yes (comment)   Criminal Activity/Legal Involvement Pertinent to Current Situation/Hospitalization: No - Comment as needed  Activities of Daily Living      Permission Sought/Granted                  Emotional Assessment Appearance:: Appears stated age Attitude/Demeanor/Rapport: Engaged Affect (typically  observed): Accepting Orientation: : Oriented to Self,Oriented to Place,Oriented to Situation   Psych Involvement: No (comment)  Admission diagnosis:  Acute right MCA stroke ([place]) [I63.511] Cerebral infarction due to occlusion of right middle cerebral artery ([place]) [I63.511] Patient Active Problem List   Diagnosis Date Noted  . Acute right MCA stroke ([place]) 04/20/2020   PCP:  Patient, No Pcp Per Pharmacy:   [id number] [id number] [id number]: [phone]: [phone]    Social Determinants of Health (SDOH) Interventions    Readmission Risk Interventions No flowsheet data found.

## 2020-04-23 NOTE — Discharge Summary (Addendum)
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 s/p TPA and thrombectomy achieving a TICI 3 right MCA revascularization, likely due to large vessel disease source    Other problems:  Bilateral carotid stenosis  Hx of stroke  HTN  HLD  DM  Cocaine abuse  CKD  Leukocytosis    LABORATORY STUDIES CBC    Component Value Date/Time   WBC 9.2 04/23/2020 0345   RBC 4.00 (L) 04/23/2020 0345   HGB 12.4 (L) 04/23/2020 0345   HCT 37.0 (L) 04/23/2020 0345   PLT 311 04/23/2020 0345   MCV 92.5 04/23/2020 0345   MCH 31.0 04/23/2020 0345   MCHC 33.5 04/23/2020 0345   RDW 13.5 04/23/2020 0345   LYMPHSABS 1.6 04/20/2020 0011   MONOABS 0.5 04/20/2020 0011   EOSABS 0.2 04/20/2020 0011   BASOSABS 0.0 04/20/2020 0011   CMP    Component Value Date/Time   NA 138 04/23/2020 0345   K 3.4 (L) 04/23/2020 0345   CL 108 04/23/2020 0345   CO2 24 04/23/2020 0345   GLUCOSE 91 04/23/2020 0345   BUN 13 04/23/2020 0345   CREATININE 2.11 (H) 04/23/2020 0345   CALCIUM 8.4 (L) 04/23/2020 0345   PROT 5.4 (L) 04/20/2020 0500   ALBUMIN 2.5 (L) 04/20/2020 0500   AST 34 04/20/2020 0500   ALT 23 04/20/2020 0500   ALKPHOS 68 04/20/2020 0500   BILITOT 0.6 04/20/2020 0500   GFRNONAA 36 (L) 04/23/2020 0345   COAGS Lab Results  Component Value Date   INR 0.9 04/20/2020   Lipid Panel    Component Value Date/Time   CHOL 229 (H) 04/20/2020 0500   TRIG 238 (H) 04/20/2020 0500   HDL 83 04/20/2020 0500   CHOLHDL 2.8 04/20/2020 0500   VLDL 48 (H) 04/20/2020 0500   LDLCALC 98 04/20/2020 0500   HgbA1C  Lab Results  Component Value Date   HGBA1C 7.3 (H)  04/20/2020   Urinalysis    Component Value Date/Time   COLORURINE YELLOW 04/20/2020 1757   APPEARANCEUR CLEAR 04/20/2020 1757   LABSPEC 1.032 (H) 04/20/2020 1757   PHURINE 5.0 04/20/2020 1757   GLUCOSEU 150 (A) 04/20/2020 1757   HGBUR MODERATE (A) 04/20/2020 1757   BILIRUBINUR NEGATIVE 04/20/2020 1757   KETONESUR NEGATIVE 04/20/2020 1757   PROTEINUR >=300 (A) 04/20/2020 1757   NITRITE NEGATIVE 04/20/2020 1757   LEUKOCYTESUR NEGATIVE 04/20/2020 1757   Urine Drug Screen     Component Value Date/Time   LABOPIA NONE DETECTED 04/20/2020 1757   COCAINSCRNUR POSITIVE (A) 04/20/2020 1757   LABBENZ NONE DETECTED 04/20/2020 1757   AMPHETMU NONE DETECTED 04/20/2020 1757   THCU POSITIVE (A) 04/20/2020 1757   LABBARB NONE DETECTED 04/20/2020 1757    Alcohol Level    Component Value Date/Time   ETH <10 04/20/2020 0011     SIGNIFICANT DIAGNOSTIC STUDIES MR BRAIN WO CONTRAST  Result Date: 04/21/2020 CLINICAL DATA:  60 year old male code stroke presentation with right ICA/MCA tandem occlusion, abnormal CT Perfusion. Status post Neurointerventional endovascular reperfusion. EXAM: MRI HEAD WITHOUT CONTRAST TECHNIQUE: Multiplanar, multiecho pulse sequences of the brain and surrounding structures were obtained without intravenous contrast.  COMPARISON:  CTA head, CTP and NIR 04/20/2020. FINDINGS: Study is intermittently degraded by motion artifact despite repeated imaging attempts. Brain: Patchy and confluent restricted diffusion in the right ACA and right MCA territory (series 7, image 51). Cortical and subcortical white matter involvement. Involvement of the posterior right insula. Scattered additional periventricular and white matter small foci of restricted diffusion in the right hemisphere including some of the anterior occipital lobe. The most confluent area of right MCA involvement in the posterior MCA region is similar to the predicted infarct core by CTP (series 5, image 87 today). And some  of the posterior right ACA involvement was also predicted by CTP. No contralateral left cerebral hemisphere restricted diffusion but there is a small focus of diffusion restriction in the central right cerebellum on series 5, image 59. Cytotoxic edema. Possible petechial hemorrhage right posterosuperior temporal gyrus (series 12, image 9). No other acute intracranial hemorrhage identified. Possible chronic microhemorrhage anterior right inferior frontal gyrus series 12, image 13. No intracranial mass effect. No ventriculomegaly. Chronic appearing lacunar infarcts in the bilateral deep gray matter nuclei, the right cerebellar peduncle, the bilateral ventral pons, and bilateral cerebellum. Cervicomedullary junction and pituitary are within normal limits. Vascular: Major intracranial vascular flow voids are preserved. Skull and upper cervical spine: Negative. Sinuses/Orbits: Stable. Chronic postoperative changes to the left orbital walls. Other: Trace mastoid fluid. IMPRESSION: 1. Patchy and confluent acute infarcts in the right ACA and right MCA territory. The most confluent appearing similar to predicted infarct core by CTP. Possible petechial blood, Heidelberg class 1a, right superior temporal gyrus. No significant intracranial mass effect. 2. Punctate acute infarct also in the right cerebellum. 3. Chronic lacunar infarcts in the bilateral deep gray nuclei, bilateral brainstem and cerebellum. Electronically Signed   By: Genevie Ann M.D.   On: 04/21/2020 05:59   IR CT Head Ltd  Result Date: 04/20/2020 INDICATION: 60 year old male with history acute right MCA syndrome, presents for attempt at mechanical thrombectomy and treatment. EXAM: ULTRASOUND-GUIDED ACCESS RIGHT COMMON FEMORAL ARTERY RIGHT-SIDED CERVICAL AND CEREBRAL ANGIOGRAM LEFT-SIDED CERVICAL ANGIOGRAM BALLOON ANGIOPLASTY OCCLUDED RIGHT CERVICAL ICA MECHANICAL THROMBECTOMY RIGHT MCA M2 DIVISION ANGIO-SEAL FOR HEMOSTASIS COMPARISON:  CT imaging same day  MEDICATIONS: 300 mg rectal aspirin ANESTHESIA/SEDATION: The anesthesia team was present to provide general endotracheal tube anesthesia and for patient monitoring during the procedure. Intubation was performed in negative pressure Bay in neuro IR holding. Left radial arterial line was performed by the anesthesia team. Interventional neuro radiology nursing staff was also present. CONTRAST:  100 cc IV contrast FLUOROSCOPY TIME:  Fluoroscopy Time: 18 minutes 12 seconds (889 mGy). COMPLICATIONS: SIR LEVEL B - Normal therapy, includes overnight admission for observation. Right-sided sylvian subarachnoid hemorrhage TECHNIQUE: Informed written consent was obtained from the patient's family after a thorough discussion of the procedural risks, benefits and alternatives. Specific risks discussed include: Bleeding, infection, contrast reaction, kidney injury/failure, need for further procedure/surgery, arterial injury or dissection, embolization to new territory, intracranial hemorrhage (10-15% risk), neurologic deterioration, cardiopulmonary collapse, death. All questions were addressed. Maximal Sterile Barrier Technique was utilized including during the procedure including caps, mask, sterile gowns, sterile gloves, sterile drape, hand hygiene and skin antiseptic. A timeout was performed prior to the initiation of the procedure. The anesthesia team was present to provide general endotracheal tube anesthesia and for patient monitoring during the procedure. Interventional neuro radiology nursing staff was also present. FINDINGS: Initial Findings: Right common carotid artery:  Normal course caliber and contour. Right external carotid artery: Patent with antegrade flow.  Right internal carotid artery: Right ICA occluded at the cervical ICA. Calcified plaque present at the bulb. Right MCA: After initial crossing and angioplasty of the cervical ICA, repeat angiogram was performed at the skull base. This demonstrated a transient M1  occlusion, which then again migrated distally into the inferior division. Initial perfusion of the targeted vessel is TICI 0: No perfusion or antegrade flow beyond site of occlusion Right ACA:  A 1 segment patent. Completion Findings: Right MCA: Patent. The targeted artery, inferior division, dominant artery to the parietal region, final score of TICI 3: Complete perfusion of the territory Flat panel CT demonstrates small volume subarachnoid hemorrhage in the right sylvian sulci. Final angiogram of the cervical ICA after balloon angioplasty demonstrates persisting stenosis, measured 42% by NASCET CT criteria. TICI 3 Flat panel CT Findings: Left common carotid artery:  Normal course caliber and contour. Left external carotid artery: Patent with antegrade flow. Left internal carotid artery: Irregular string sign at the proximal left cervical ICA, with high-grade stenosis. NASCET criteria measurement is 79% stenosis PROCEDURE: The anesthesia team was present to provide general endotracheal tube anesthesia and for patient monitoring during the procedure. Intubation was performed in negative pressure Bay in neuro IR holding. Interventional neuro radiology nursing staff was also present. Ultrasound survey of the right inguinal region was performed with images stored and sent to PACs. 11 blade scalpel was used to make a small incision. Blunt dissection was performed with US guidance. A micropuncture needle was used access the right common femoral artery under ultrasound. With excellent arterial blood flow returned, an .018 micro wire was passed through the needle, observed to enter the abdominal aorta under fluoroscopy. The needle was removed, and a micropuncture sheath was placed over the wire. The inner dilator and wire were removed, and an 035 wire was advanced under fluoroscopy into the abdominal aorta. The sheath was removed and a 25cm 33F straight vascular sheath was placed. The dilator was removed and the sheath was  flushed. Sheath was attached to pressurized and heparinized saline bag for constant forward flow. 125 cm parents stent diagnostic catheter was used to select the innominate artery. The catheter was advanced into the common carotid artery using a roadrunner diagnostic wire. Wire was removed and angiogram was performed in the distal common carotid artery. The catheter was then directed towards the external carotid artery and a rose in wire was placed. A 95cm 087 "Walrus" balloon guide was then advanced on the Rose an wire. Once the balloon guide was position in the distal cervical ICA, rose in wire was removed. Baseline angiogram was again performed with roadmap achieved. A rapid transit microcatheter with a coaxial synchro soft wire was used to probe and cross the proximal cervical ICA occlusion. The wire was advanced distally with the rapid transit catheter, to the distal cervical ICA. Wire was removed and initial angiogram was performed. This proved a luminal position. A exchange length, 300 cm transcend wire with a floppy tip was then placed and the rapid transit was removed. Standard balloon angioplasty with a 4 mm by 40 mm Viatrac was performed at the carotid bulb. The balloon was inflated to 10.2 atmosphere. The balloon was deflated and the balloon guide was then advanced on the wire and the balloon shaft. A distal position of the cervical ICA was achieved. The balloon and the transcend wire were removed. Adequate flush was achieved at the balloon guide and then a new diagnostic angiogram was performed for road mapping of the intracranial  occlusion. We identified the target as inferior division, M2, with a dominant perfusion to the parietal lobe. Copious back flush was performed and the balloon catheter was attached to heparinized and pressurized saline bag for forward flow. A second coaxial system was then advanced through the balloon catheter, which included the selected intermediate catheter, microcatheter,  and microwire. In this scenario, the set up included a zoom 55 intermediate catheter, a Trevo Provue18 microcatheter, and 014 synchro soft wire. This system was advanced through the balloon guide catheter under the road-map function, with adequate back-flush at the rotating hemostatic valve at that back end of the balloon guide. Microcatheter and the intermediate catheter system were advanced through the terminal ICA and MCA to the level of the inferior division M2 occlusion. The micro wire was then carefully advanced through the occluded segment. Microcatheter was then manipulated through the occluded segment and the wire was removed with saline drip at the hub. Blood was then aspirated through the hub of the microcatheter, and a gentle contrast injection was performed confirming intraluminal position. A rotating hemostatic valve was then attached to the back end of the microcatheter, and a pressurized and heparinized saline bag was attached to the catheter. 3 x 20 solitaire device was then selected. Back flush was achieved at the rotating hemostatic valve, and then the device was gently advanced through the microcatheter to the distal end. The retriever was then unsheathed by withdrawing the microcatheter under fluoroscopy. Once the retriever was completely unsheathed, the microcatheter was carefully stripped from the delivery device. Control angiogram was performed from the intermediate catheter. A 3 minute time interval was observed. The balloon at the balloon guide catheter was then inflated under fluoroscopy for proximal flow arrest. Constant aspiration using the proprietary engine was then performed at the intermediate catheter, as the retriever was gently and slowly withdrawn with fluoroscopic observation. Once the retriever was "corked" within the tip of the intermediate catheter, both were removed from the system. Free aspiration was confirmed at the hub of the balloon guide catheter, with free blood  return confirmed. The balloon was then deflated, and a control angiogram was performed. Persisting occlusion of the inferior division was observed. We elected to perform a second pass. Second pass: The same coaxial system was advanced using a zoom 55 intermediate catheter, a Trevo Provue18 microcatheter, and 014 synchro soft wire. This system was advanced through the balloon guide catheter under the road-map function, with adequate back-flush at the rotating hemostatic valve at that back end of the balloon guide. Microcatheter and the intermediate catheter system were advanced through the terminal ICA and MCA to the level of the inferior division M2 occlusion. The micro wire and catheter were then removed. A direct aspiration strategy was then used with the zoom 55 catheter. The proprietary engine was turned on and the catheter was advanced slowly until there was no longer aspiration of blood products. Approximately 20 seconds-30 seconds was observed and the catheter was removed. Adequate aspiration was then performed at the hub of the balloon guide and another angiogram was performed. Restoration of flow was confirmed. Transcend wire was then advanced through the balloon guide and the balloon guide was withdrawn to the common carotid artery. Angiogram of the cervical ICA was performed. Flat panel CT was performed. After the imaging was performed of the head and the angiogram of the cervical ICA was reviewed, results were discussed with the neurology team. At this point, given the estimated 40% narrowing of the cervical ICA and the  subarachnoid hemorrhage, we elected to terminate the case for observation, and surveillance of the carotid disease. The balloon guide and the wire were removed. A left-sided cervical angiogram was then performed using a JB 1 catheter. Catheter was removed. The skin at the puncture site was then cleaned with Chlorhexidine. The 8 French sheath was removed and an 71F angioseal was deployed.  Patient was extubated. Patient tolerated the procedure well and remained hemodynamically stable throughout. No complications were encountered and no significant blood loss encountered. IMPRESSION: Status post ultrasound guided access right common femoral artery for bilateral cervical/cerebral angiogram, and treatment of right-sided cervical ICA/MCA tandem occlusion, treating the cervical ICA occlusion with 4 mm balloon angioplasty, and restoring TICI 3 perfusion to the inferior division M2 branch with 2 passes of combination stent retriever and direct aspiration. Angio-Seal for closure. Signed, Dulcy Fanny. Dellia Nims, [facility] Radiology PLAN: Patient extubated. ICU status Target systolic blood pressure of 120-140 Right hip straight time 6 hours Frequent neurovascular checks Repeat neurologic imaging with CT and/MRI at the discretion of neurology team Continue aspirin antiplatelets Carotid duplex for carotid surveillance Electronically Signed   By: Corrie Mckusick D.O.   On: 04/20/2020 05:29   IR US Guide Vasc Access Right  Result Date: 04/20/2020 INDICATION: 60 year old male with history acute right MCA syndrome, presents for attempt at mechanical thrombectomy and treatment. EXAM: ULTRASOUND-GUIDED ACCESS RIGHT COMMON FEMORAL ARTERY RIGHT-SIDED CERVICAL AND CEREBRAL ANGIOGRAM LEFT-SIDED CERVICAL ANGIOGRAM BALLOON ANGIOPLASTY OCCLUDED RIGHT CERVICAL ICA MECHANICAL THROMBECTOMY RIGHT MCA M2 DIVISION ANGIO-SEAL FOR HEMOSTASIS COMPARISON:  CT imaging same day MEDICATIONS: 300 mg rectal aspirin ANESTHESIA/SEDATION: The anesthesia team was present to provide general endotracheal tube anesthesia and for patient monitoring during the procedure. Intubation was performed in negative pressure Bay in neuro IR holding. Left radial arterial line was performed by the anesthesia team. Interventional neuro radiology nursing staff was also present. CONTRAST:  100 cc IV contrast  FLUOROSCOPY TIME:  Fluoroscopy Time: 18 minutes 12 seconds (889 mGy). COMPLICATIONS: SIR LEVEL B - Normal therapy, includes overnight admission for observation. Right-sided sylvian subarachnoid hemorrhage TECHNIQUE: Informed written consent was obtained from the patient's family after a thorough discussion of the procedural risks, benefits and alternatives. Specific risks discussed include: Bleeding, infection, contrast reaction, kidney injury/failure, need for further procedure/surgery, arterial injury or dissection, embolization to new territory, intracranial hemorrhage (10-15% risk), neurologic deterioration, cardiopulmonary collapse, death. All questions were addressed. Maximal Sterile Barrier Technique was utilized including during the procedure including caps, mask, sterile gowns, sterile gloves, sterile drape, hand hygiene and skin antiseptic. A timeout was performed prior to the initiation of the procedure. The anesthesia team was present to provide general endotracheal tube anesthesia and for patient monitoring during the procedure. Interventional neuro radiology nursing staff was also present. FINDINGS: Initial Findings: Right common carotid artery:  Normal course caliber and contour. Right external carotid artery: Patent with antegrade flow. Right internal carotid artery: Right ICA occluded at the cervical ICA. Calcified plaque present at the bulb. Right MCA: After initial crossing and angioplasty of the cervical ICA, repeat angiogram was performed at the skull base. This demonstrated a transient M1 occlusion, which then again migrated distally into the inferior division. Initial perfusion of the targeted vessel is TICI 0: No perfusion or antegrade flow beyond site of occlusion Right ACA:  A 1 segment patent. Completion Findings: Right MCA: Patent. The targeted artery, inferior division, dominant artery to the parietal region, final score of TICI 3: Complete perfusion of the  territory Flat panel CT  demonstrates small volume subarachnoid hemorrhage in the right sylvian sulci. Final angiogram of the cervical ICA after balloon angioplasty demonstrates persisting stenosis, measured 42% by NASCET CT criteria. TICI 3 Flat panel CT Findings: Left common carotid artery:  Normal course caliber and contour. Left external carotid artery: Patent with antegrade flow. Left internal carotid artery: Irregular string sign at the proximal left cervical ICA, with high-grade stenosis. NASCET criteria measurement is 79% stenosis PROCEDURE: The anesthesia team was present to provide general endotracheal tube anesthesia and for patient monitoring during the procedure. Intubation was performed in negative pressure Bay in neuro IR holding. Interventional neuro radiology nursing staff was also present. Ultrasound survey of the right inguinal region was performed with images stored and sent to PACs. 11 blade scalpel was used to make a small incision. Blunt dissection was performed with US guidance. A micropuncture needle was used access the right common femoral artery under ultrasound. With excellent arterial blood flow returned, an .018 micro wire was passed through the needle, observed to enter the abdominal aorta under fluoroscopy. The needle was removed, and a micropuncture sheath was placed over the wire. The inner dilator and wire were removed, and an 035 wire was advanced under fluoroscopy into the abdominal aorta. The sheath was removed and a 25cm 52F straight vascular sheath was placed. The dilator was removed and the sheath was flushed. Sheath was attached to pressurized and heparinized saline bag for constant forward flow. 125 cm parents stent diagnostic catheter was used to select the innominate artery. The catheter was advanced into the common carotid artery using a roadrunner diagnostic wire. Wire was removed and angiogram was performed in the distal common carotid artery. The catheter was then directed towards the external  carotid artery and a rose in wire was placed. A 95cm 087 "Walrus" balloon guide was then advanced on the Rose an wire. Once the balloon guide was position in the distal cervical ICA, rose in wire was removed. Baseline angiogram was again performed with roadmap achieved. A rapid transit microcatheter with a coaxial synchro soft wire was used to probe and cross the proximal cervical ICA occlusion. The wire was advanced distally with the rapid transit catheter, to the distal cervical ICA. Wire was removed and initial angiogram was performed. This proved a luminal position. A exchange length, 300 cm transcend wire with a floppy tip was then placed and the rapid transit was removed. Standard balloon angioplasty with a 4 mm by 40 mm Viatrac was performed at the carotid bulb. The balloon was inflated to 10.2 atmosphere. The balloon was deflated and the balloon guide was then advanced on the wire and the balloon shaft. A distal position of the cervical ICA was achieved. The balloon and the transcend wire were removed. Adequate flush was achieved at the balloon guide and then a new diagnostic angiogram was performed for road mapping of the intracranial occlusion. We identified the target as inferior division, M2, with a dominant perfusion to the parietal lobe. Copious back flush was performed and the balloon catheter was attached to heparinized and pressurized saline bag for forward flow. A second coaxial system was then advanced through the balloon catheter, which included the selected intermediate catheter, microcatheter, and microwire. In this scenario, the set up included a zoom 55 intermediate catheter, a Trevo Provue18 microcatheter, and 014 synchro soft wire. This system was advanced through the balloon guide catheter under the road-map function, with adequate back-flush at the rotating hemostatic valve at that  back end of the balloon guide. Microcatheter and the intermediate catheter system were advanced through the  terminal ICA and MCA to the level of the inferior division M2 occlusion. The micro wire was then carefully advanced through the occluded segment. Microcatheter was then manipulated through the occluded segment and the wire was removed with saline drip at the hub. Blood was then aspirated through the hub of the microcatheter, and a gentle contrast injection was performed confirming intraluminal position. A rotating hemostatic valve was then attached to the back end of the microcatheter, and a pressurized and heparinized saline bag was attached to the catheter. 3 x 20 solitaire device was then selected. Back flush was achieved at the rotating hemostatic valve, and then the device was gently advanced through the microcatheter to the distal end. The retriever was then unsheathed by withdrawing the microcatheter under fluoroscopy. Once the retriever was completely unsheathed, the microcatheter was carefully stripped from the delivery device. Control angiogram was performed from the intermediate catheter. A 3 minute time interval was observed. The balloon at the balloon guide catheter was then inflated under fluoroscopy for proximal flow arrest. Constant aspiration using the proprietary engine was then performed at the intermediate catheter, as the retriever was gently and slowly withdrawn with fluoroscopic observation. Once the retriever was "corked" within the tip of the intermediate catheter, both were removed from the system. Free aspiration was confirmed at the hub of the balloon guide catheter, with free blood return confirmed. The balloon was then deflated, and a control angiogram was performed. Persisting occlusion of the inferior division was observed. We elected to perform a second pass. Second pass: The same coaxial system was advanced using a zoom 55 intermediate catheter, a Trevo Provue18 microcatheter, and 014 synchro soft wire. This system was advanced through the balloon guide catheter under the road-map  function, with adequate back-flush at the rotating hemostatic valve at that back end of the balloon guide. Microcatheter and the intermediate catheter system were advanced through the terminal ICA and MCA to the level of the inferior division M2 occlusion. The micro wire and catheter were then removed. A direct aspiration strategy was then used with the zoom 55 catheter. The proprietary engine was turned on and the catheter was advanced slowly until there was no longer aspiration of blood products. Approximately 20 seconds-30 seconds was observed and the catheter was removed. Adequate aspiration was then performed at the hub of the balloon guide and another angiogram was performed. Restoration of flow was confirmed. Transcend wire was then advanced through the balloon guide and the balloon guide was withdrawn to the common carotid artery. Angiogram of the cervical ICA was performed. Flat panel CT was performed. After the imaging was performed of the head and the angiogram of the cervical ICA was reviewed, results were discussed with the neurology team. At this point, given the estimated 40% narrowing of the cervical ICA and the subarachnoid hemorrhage, we elected to terminate the case for observation, and surveillance of the carotid disease. The balloon guide and the wire were removed. A left-sided cervical angiogram was then performed using a JB 1 catheter. Catheter was removed. The skin at the puncture site was then cleaned with Chlorhexidine. The 8 French sheath was removed and an 51F angioseal was deployed. Patient was extubated. Patient tolerated the procedure well and remained hemodynamically stable throughout. No complications were encountered and no significant blood loss encountered. IMPRESSION: Status post ultrasound guided access right common femoral artery for bilateral cervical/cerebral angiogram, and treatment  of right-sided cervical ICA/MCA tandem occlusion, treating the cervical ICA occlusion with 4  mm balloon angioplasty, and restoring TICI 3 perfusion to the inferior division M2 branch with 2 passes of combination stent retriever and direct aspiration. Angio-Seal for closure. Signed, Dulcy Fanny. Dellia Nims, [facility] Radiology PLAN: Patient extubated. ICU status Target systolic blood pressure of 120-140 Right hip straight time 6 hours Frequent neurovascular checks Repeat neurologic imaging with CT and/MRI at the discretion of neurology team Continue aspirin antiplatelets Carotid duplex for carotid surveillance Electronically Signed   By: Corrie Mckusick D.O.   On: 04/20/2020 05:29   CT CEREBRAL PERFUSION W CONTRAST  Result Date: 04/20/2020 CLINICAL DATA:  Initial evaluation for acute stroke, left-sided weakness. EXAM: CT ANGIOGRAPHY HEAD AND NECK CT PERFUSION BRAIN TECHNIQUE: Multidetector CT imaging of the head and neck was performed using the standard protocol during bolus administration of intravenous contrast. Multiplanar CT image reconstructions and MIPs were obtained to evaluate the vascular anatomy. Carotid stenosis measurements (when applicable) are obtained utilizing NASCET criteria, using the distal internal carotid diameter as the denominator. Multiphase CT imaging of the brain was performed following IV bolus contrast injection. Subsequent parametric perfusion maps were calculated using RAPID software. CONTRAST:  173m OMNIPAQUE IOHEXOL 350 MG/ML SOLN COMPARISON:  None. FINDINGS: CTA NECK FINDINGS Aortic arch: Visualized aortic arch normal in caliber with normal branch pattern. No hemodynamically significant stenosis about the origin of the great vessels. Visualized subclavian arteries widely patent. Right carotid system: Right CCA patent from its origin to the bifurcation without stenosis. There is abrupt occlusion of the proximal right ICA just beyond the right bifurcation. Right ICA remains occluded within the neck. Left carotid system:  Left CCA patent from its origin to the bifurcation without stenosis. There is severe near occlusive stenosis of the proximal left ICA just beyond the left bifurcation. A radiographic string sign is present. Stenosis begins just distal to the bifurcation, and measures approximately 2.1 cm in length (series 510, image 399). Left ICA otherwise patent distally to the skull base without stenosis, dissection, or occlusion. Vertebral arteries: Both vertebral arteries arise from the subclavian arteries. Vertebral arteries patent within the neck without stenosis, dissection or occlusion. Skeleton: Exaggeration of the normal thoracic kyphosis. No visible acute osseous finding. No discrete or worrisome osseous lesions. Other neck: No other acute soft tissue abnormality within the neck. No mass or adenopathy. Poor dentition noted. Upper chest: Visualized upper chest demonstrates no acute finding. Review of the MIP images confirms the above findings CTA HEAD FINDINGS Anterior circulation: Right ICA remains occluded at the skull base. Distal reconstitution at the cavernous segment via collateral flow across the circle-of-Willis. Right M1 segment perfused to the right MCA bifurcation. There is downstream occlusion of a distal right M2/M3 branch, inferior division (series 510, image 181). Some attenuated flow seen distally, which could be related to subocclusive thrombus and/or collateralization. Remainder of the right MCA branches perfused. Petrous left ICA widely patent. Atheromatous change within the cavernous left ICA without significant stenosis. A1 segments patent bilaterally. Normal anterior communicating artery complex. Anterior cerebral arteries patent to their distal aspects without stenosis. Left M1 widely patent. Normal left MCA bifurcation. Distal left MCA branches well perfused. Posterior circulation: Both V4 segments patent to the vertebrobasilar junction without stenosis. Left vertebral slightly dominant. Both PICA  origins patent and normal. Basilar patent to its distal aspect without stenosis. Superior cerebellar arteries patent bilaterally. Both PCA supplied via the basilar as well as small  bilateral posterior communicating arteries. PCAs well perfused to their distal aspects. Venous sinuses: Grossly patent allowing for timing the contrast bolus. Anatomic variants: None significant.  No intracranial aneurysm. Review of the MIP images confirms the above findings CT Brain Perfusion Findings: ASPECTS: 10. CBF (<30%) Volume: 85m Perfusion (Tmax>6.0s) volume: 849mMismatch Volume: 6711mnfarction Location:Acute core infarct involving the posterior right MCA distribution at the right frontoparietal region. 67 cc surrounding penumbra. IMPRESSION: CTA HEAD AND NECK IMPRESSION: 1. Positive CTA for with abrupt occlusion of the proximal right ICA just beyond the right bifurcation. Distal reconstitution at the cavernous segment via collateral flow across the circle-of-Willis. Subsequent downstream occlusion at a distal right M2/M3 branch, inferior division. 2. 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 3. Additional mild atheromatous change elsewhere about the major arterial vasculature of the head and neck. No other high-grade or correctable stenosis. 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 Critical Value/emergent results were called by telephone at the time of interpretation on 04/20/2020 at 12:30 am to [facility] verb[facility] these results. Electronically Signed   By: BenJeannine BogaD.   On: 04/20/2020 01:24   ECHOCARDIOGRAM COMPLETE BUBBLE STUDY  Result Date: 04/20/2020    ECHOCARDIOGRAM REPORT   Patient Name:   JOSQUETIN WAGENBACHte of Exam: 04/20/2020 Medical Rec #:  [id number] Height: Accession #:    [id number]: Date of Birth:   7/1April 07, 1962 BSA: Patient Age:    [age]     BP:           140/82 mmHg Patient Gender: M            HR:           83 bpm. Exam Location:  Inpatient Procedure: 2D Echo, Cardiac Doppler, Color Doppler and Saline Contrast Bubble            Study Indications:    Stroke 434.91 / I63.9  History:        Patient has no prior history of Echocardiogram examinations.  Sonographer:    TifDarlina SicilianCS Referring Phys: [id number]. Technically suboptima saline microcavitation study; no obvious shunt but cannot completely exclude with this study.  2. Left ventricular ejection fraction, by estimation, is 55 to 60%. The left ventricle has normal function. The left ventricle has no regional wall motion abnormalities. There is mild left ventricular hypertrophy. Left ventricular diastolic parameters are consistent with Grade I diastolic dysfunction (impaired relaxation).  3. Right ventricular systolic function is normal. The right ventricular size is normal.  4. The mitral valve is normal in structure. No evidence of mitral valve regurgitation. No evidence of mitral stenosis.  5. The aortic valve is tricuspid. Aortic valve regurgitation is not visualized. No aortic stenosis is present.  6. The inferior vena cava is normal in size with greater than 50% respiratory variability, suggesting right atrial pressure of 3 mmHg. FINDINGS  Left Ventricle: Left ventricular ejection fraction, by estimation, is 55 to 60%. The left ventricle has normal function. The left ventricle has no regional wall motion abnormalities. The left ventricular internal cavity size was normal in size. There is  mild left ventricular hypertrophy. Left ventricular diastolic parameters are consistent with Grade I diastolic dysfunction (impaired relaxation). Right Ventricle: The right ventricular size is normal.Right ventricular systolic function is normal.  Left Atrium: Left atrial size was normal in size. Right Atrium: Right atrial size  was normal in size. Pericardium: There is no evidence of pericardial effusion. Mitral Valve: The mitral valve is normal in structure. Mild mitral annular calcification. No evidence of mitral valve regurgitation. No evidence of mitral valve stenosis. Tricuspid Valve: The tricuspid valve is normal in structure. Tricuspid valve regurgitation is trivial. No evidence of tricuspid stenosis. Aortic Valve: The aortic valve is tricuspid. Aortic valve regurgitation is not visualized. No aortic stenosis is present. Pulmonic Valve: The pulmonic valve was not well visualized. Pulmonic valve regurgitation is not visualized. No evidence of pulmonic stenosis. Aorta: The aortic root is normal in size and structure. Venous: The inferior vena cava is normal in size with greater than 50% respiratory variability, suggesting right atrial pressure of 3 mmHg. IAS/Shunts: The interatrial septum is aneurysmal. No atrial level shunt detected by color flow Doppler. Agitated saline contrast was given intravenously to evaluate for intracardiac shunting. Additional Comments: Technically suboptima saline microcavitation study; no obvious shunt but cannot completely exclude with this study.  LEFT VENTRICLE PLAX 2D LVIDd:         3.80 cm      Diastology LVIDs:         2.50 cm      LV e' medial:    6.16 cm/s LV PW:         1.20 cm      LV E/e' medial:  10.8 LV IVS:        1.20 cm      LV e' lateral:   6.97 cm/s LVOT diam:     2.00 cm      LV E/e' lateral: 9.5 LV SV:         67 LVOT Area:     3.14 cm  LV Volumes (MOD) LV vol d, MOD A2C: 104.0 ml LV vol d, MOD A4C: 127.0 ml LV vol s, MOD A2C: 52.4 ml LV vol s, MOD A4C: 67.3 ml LV SV MOD A2C:     51.6 ml LV SV MOD A4C:     127.0 ml LV SV MOD BP:      57.0 ml RIGHT VENTRICLE RV S prime:     17.20 cm/s TAPSE (M-mode): 2.0 cm LEFT ATRIUM             RIGHT ATRIUM LA diam:        3.40 cm RA Area:     7.14 cm LA Vol (A2C):   36.7 ml RA Volume:   9.67 ml LA Vol (A4C):   38.1 ml LA Biplane Vol: 38.0 ml   AORTIC VALVE LVOT Vmax:   114.00 cm/s LVOT Vmean:  70.200 cm/s LVOT VTI:    0.214 m  AORTA Ao Root diam: 3.00 cm MITRAL VALVE MV Area (PHT): 2.87 cm     SHUNTS MV Decel Time: 264 msec     Systemic VTI:  0.21 m MV E velocity: 66.40 cm/s   Systemic Diam: 2.00 cm MV A velocity: 107.00 cm/s MV E/A ratio:  0.62 Kirk Ruths MD Electronically signed by Kirk Ruths MD Signature Date/Time: 04/20/2020/5:20:59 PM    Final    IR PERCUTANEOUS ART THROMBECTOMY/INFUSION INTRACRANIAL INC DIAG ANGIO  Result Date: 04/20/2020 INDICATION: 60 year old male with history acute right MCA syndrome, presents for attempt at mechanical thrombectomy and treatment. EXAM: ULTRASOUND-GUIDED ACCESS RIGHT COMMON FEMORAL ARTERY RIGHT-SIDED CERVICAL AND CEREBRAL ANGIOGRAM LEFT-SIDED CERVICAL ANGIOGRAM BALLOON ANGIOPLASTY OCCLUDED RIGHT CERVICAL ICA MECHANICAL THROMBECTOMY RIGHT MCA M2  DIVISION ANGIO-SEAL FOR HEMOSTASIS COMPARISON:  CT imaging same day MEDICATIONS: 300 mg rectal aspirin ANESTHESIA/SEDATION: The anesthesia team was present to provide general endotracheal tube anesthesia and for patient monitoring during the procedure. Intubation was performed in negative pressure [place] in neuro IR holding. Left radial arterial line was performed by the anesthesia team. Interventional neuro radiology nursing staff was also present. CONTRAST:  100 cc IV contrast FLUOROSCOPY TIME:  Fluoroscopy Time: 18 minutes 12 seconds (889 mGy). COMPLICATIONS: SIR LEVEL B - Normal therapy, includes overnight admission for observation. Right-sided sylvian subarachnoid hemorrhage TECHNIQUE: Informed written consent was obtained from the patient's family after a thorough discussion of the procedural risks, benefits and alternatives. Specific risks discussed include: Bleeding, infection, contrast reaction, kidney injury/failure, need for further procedure/surgery, arterial injury or dissection, embolization to new territory, intracranial hemorrhage (10-15% risk),  neurologic deterioration, cardiopulmonary collapse, death. All questions were addressed. Maximal Sterile Barrier Technique was utilized including during the procedure including caps, mask, sterile gowns, sterile gloves, sterile drape, hand hygiene and skin antiseptic. A timeout was performed prior to the initiation of the procedure. The anesthesia team was present to provide general endotracheal tube anesthesia and for patient monitoring during the procedure. Interventional neuro radiology nursing staff was also present. FINDINGS: Initial Findings: Right common carotid artery:  Normal course caliber and contour. Right external carotid artery: Patent with antegrade flow. Right internal carotid artery: Right ICA occluded at the cervical ICA. Calcified plaque present at the bulb. Right MCA: After initial crossing and angioplasty of the cervical ICA, repeat angiogram was performed at the skull base. This demonstrated a transient M1 occlusion, which then again migrated distally into the inferior division. Initial perfusion of the targeted vessel is TICI 0: No perfusion or antegrade flow beyond site of occlusion Right ACA:  A 1 segment patent. Completion Findings: Right MCA: Patent. The targeted artery, inferior division, dominant artery to the parietal region, final score of TICI 3: Complete perfusion of the territory Flat panel CT demonstrates small volume subarachnoid hemorrhage in the right sylvian sulci. Final angiogram of the cervical ICA after balloon angioplasty demonstrates persisting stenosis, measured 42% by NASCET CT criteria. TICI 3 Flat panel CT Findings: Left common carotid artery:  Normal course caliber and contour. Left external carotid artery: Patent with antegrade flow. Left internal carotid artery: Irregular string sign at the proximal left cervical ICA, with high-grade stenosis. NASCET criteria measurement is 79% stenosis PROCEDURE: The anesthesia team was present to provide general endotracheal tube  anesthesia and for patient monitoring during the procedure. Intubation was performed in negative pressure [place] in neuro IR holding. Interventional neuro radiology nursing staff was also present. Ultrasound survey of the right inguinal region was performed with images stored and sent to PACs. 11 blade scalpel was used to make a small incision. Blunt dissection was performed with US guidance. A micropuncture needle was used access the right common femoral artery under ultrasound. With excellent arterial blood flow returned, an .018 micro wire was passed through the needle, observed to enter the abdominal aorta under fluoroscopy. The needle was removed, and a micropuncture sheath was placed over the wire. The inner dilator and wire were removed, and an 035 wire was advanced under fluoroscopy into the abdominal aorta. The sheath was removed and a 25cm 34F straight vascular sheath was placed. The dilator was removed and the sheath was flushed. Sheath was attached to pressurized and heparinized saline bag for constant forward flow. 125 cm parents stent diagnostic catheter was used to select the  innominate artery. The catheter was advanced into the common carotid artery using a roadrunner diagnostic wire. Wire was removed and angiogram was performed in the distal common carotid artery. The catheter was then directed towards the external carotid artery and a rose in wire was placed. A 95cm 087 "Walrus" balloon guide was then advanced on the Rose an wire. Once the balloon guide was position in the distal cervical ICA, rose in wire was removed. Baseline angiogram was again performed with roadmap achieved. A rapid transit microcatheter with a coaxial synchro soft wire was used to probe and cross the proximal cervical ICA occlusion. The wire was advanced distally with the rapid transit catheter, to the distal cervical ICA. Wire was removed and initial angiogram was performed. This proved a luminal position. A exchange length, 300  cm transcend wire with a floppy tip was then placed and the rapid transit was removed. Standard balloon angioplasty with a 4 mm by 40 mm Viatrac was performed at the carotid bulb. The balloon was inflated to 10.2 atmosphere. The balloon was deflated and the balloon guide was then advanced on the wire and the balloon shaft. A distal position of the cervical ICA was achieved. The balloon and the transcend wire were removed. Adequate flush was achieved at the balloon guide and then a new diagnostic angiogram was performed for road mapping of the intracranial occlusion. We identified the target as inferior division, M2, with a dominant perfusion to the parietal lobe. Copious back flush was performed and the balloon catheter was attached to heparinized and pressurized saline bag for forward flow. A second coaxial system was then advanced through the balloon catheter, which included the selected intermediate catheter, microcatheter, and microwire. In this scenario, the set up included a zoom 55 intermediate catheter, a Trevo Provue18 microcatheter, and 014 synchro soft wire. This system was advanced through the balloon guide catheter under the road-map function, with adequate back-flush at the rotating hemostatic valve at that back end of the balloon guide. Microcatheter and the intermediate catheter system were advanced through the terminal ICA and MCA to the level of the inferior division M2 occlusion. The micro wire was then carefully advanced through the occluded segment. Microcatheter was then manipulated through the occluded segment and the wire was removed with saline drip at the hub. Blood was then aspirated through the hub of the microcatheter, and a gentle contrast injection was performed confirming intraluminal position. A rotating hemostatic valve was then attached to the back end of the microcatheter, and a pressurized and heparinized saline bag was attached to the catheter. 3 x 20 solitaire device was then  selected. Back flush was achieved at the rotating hemostatic valve, and then the device was gently advanced through the microcatheter to the distal end. The retriever was then unsheathed by withdrawing the microcatheter under fluoroscopy. Once the retriever was completely unsheathed, the microcatheter was carefully stripped from the delivery device. Control angiogram was performed from the intermediate catheter. A 3 minute time interval was observed. The balloon at the balloon guide catheter was then inflated under fluoroscopy for proximal flow arrest. Constant aspiration using the proprietary engine was then performed at the intermediate catheter, as the retriever was gently and slowly withdrawn with fluoroscopic observation. Once the retriever was "corked" within the tip of the intermediate catheter, both were removed from the system. Free aspiration was confirmed at the hub of the balloon guide catheter, with free blood return confirmed. The balloon was then deflated, and a control angiogram was performed.  Persisting occlusion of the inferior division was observed. We elected to perform a second pass. Second pass: The same coaxial system was advanced using a zoom 55 intermediate catheter, a Trevo Provue18 microcatheter, and 014 synchro soft wire. This system was advanced through the balloon guide catheter under the road-map function, with adequate back-flush at the rotating hemostatic valve at that back end of the balloon guide. Microcatheter and the intermediate catheter system were advanced through the terminal ICA and MCA to the level of the inferior division M2 occlusion. The micro wire and catheter were then removed. A direct aspiration strategy was then used with the zoom 55 catheter. The proprietary engine was turned on and the catheter was advanced slowly until there was no longer aspiration of blood products. Approximately 20 seconds-30 seconds was observed and the catheter was removed. Adequate  aspiration was then performed at the hub of the balloon guide and another angiogram was performed. Restoration of flow was confirmed. Transcend wire was then advanced through the balloon guide and the balloon guide was withdrawn to the common carotid artery. Angiogram of the cervical ICA was performed. Flat panel CT was performed. After the imaging was performed of the head and the angiogram of the cervical ICA was reviewed, results were discussed with the neurology team. At this point, given the estimated 40% narrowing of the cervical ICA and the subarachnoid hemorrhage, we elected to terminate the case for observation, and surveillance of the carotid disease. The balloon guide and the wire were removed. A left-sided cervical angiogram was then performed using a JB 1 catheter. Catheter was removed. The skin at the puncture site was then cleaned with Chlorhexidine. The 8 French sheath was removed and an 52F angioseal was deployed. Patient was extubated. Patient tolerated the procedure well and remained hemodynamically stable throughout. No complications were encountered and no significant blood loss encountered. IMPRESSION: Status post ultrasound guided access right common femoral artery for bilateral cervical/cerebral angiogram, and treatment of right-sided cervical ICA/MCA tandem occlusion, treating the cervical ICA occlusion with 4 mm balloon angioplasty, and restoring TICI 3 perfusion to the inferior division M2 branch with 2 passes of combination stent retriever and direct aspiration. Angio-Seal for closure. Signed, Dulcy Fanny. Dellia Nims, [facility] Radiology PLAN: Patient extubated. ICU status Target systolic blood pressure of 120-140 Right hip straight time 6 hours Frequent neurovascular checks Repeat neurologic imaging with CT and/MRI at the discretion of neurology team Continue aspirin antiplatelets Carotid duplex for carotid surveillance Electronically  Signed   By: Corrie Mckusick D.O.   On: 04/20/2020 05:29   CT HEAD CODE STROKE WO CONTRAST  Result Date: 04/20/2020 CLINICAL DATA:  Code stroke.  Initial evaluation for acute stroke. EXAM: CT HEAD WITHOUT CONTRAST TECHNIQUE: Contiguous axial images were obtained from the base of the skull through the vertex without intravenous contrast. COMPARISON:  None. FINDINGS: Brain: Cerebral volume within normal limits. Few small remote lacunar infarcts present at the at the left basal ganglia. Associated wallerian degeneration on the right. No acute intracranial hemorrhage. No convincing acute or evolving large vessel territory ischemia. No mass lesion, midline shift or mass effect. No hydrocephalus or extra-axial fluid collection. Vascular: Asymmetric hyperdensity at the level of distal right M2/M3 branches at the right sylvian fissure, concerning for thrombus (series 3, image 16). Skull: Scalp soft tissues and calvarium within normal limits. Sinuses/Orbits: Right gaze noted. Sequelae of prior ORIF noted at the left face. Mild chronic mucosal thickening noted within the ethmoidal air  cells and maxillary sinuses. No mastoid effusion. Other: None. ASPECTS [facility] Stroke Program Early CT Score) - Ganglionic level infarction (caudate, lentiform nuclei, internal capsule, insula, M1-M3 cortex): 7 - Supraganglionic infarction (M4-M6 cortex): 3 Total score (0-10 with 10 being normal): 10 IMPRESSION: 1. Asymmetric hyperdensity involving right M2/M3 branches, concerning for thrombus. Further evaluation with dedicated CTA recommended. No intracranial hemorrhage. 2. ASPECTS is 10. 3. Few remote lacunar infarcts about the bilateral basal ganglia. Critical Value/emergent results were called by telephone at the time of interpretation on 04/20/2020 at 12:30 am to provider Dr. Lorrin Goodell, who verbally acknowledged these results. Electronically Signed   By: Jeannine Boga M.D.   On: 04/20/2020 00:58   VAS US CAROTID  Result Date:  04/21/2020 Carotid Arterial Duplex Study Indications:       CVA, Numbness, Weakness and CTA of neck showed right ICA                    occlusion and left ICA string sign at bifurcation. Risk Factors:      Hypertension, hyperlipidemia, Diabetes, prior CVA. Other Factors:     Polysubstance abuse. Limitations        Today's exam was limited due to the patient's inability or                    unwillingness to cooperate. Comparison Study:  No prior study on file Performing Technologist: Sharion Dove RVS  Examination Guidelines: A complete evaluation includes B-mode imaging, spectral Doppler, color Doppler, and power Doppler as needed of all accessible portions of each vessel. Bilateral testing is considered an integral part of a complete examination. Limited examinations for reoccurring indications may be performed as noted.  Right Carotid Findings: +----------+--------+--------+--------+------------------+------------------+           PSV cm/sEDV cm/sStenosisPlaque DescriptionComments           +----------+--------+--------+--------+------------------+------------------+ CCA Prox  118     17                                intimal thickening +----------+--------+--------+--------+------------------+------------------+ CCA Distal83      12                                                   +----------+--------+--------+--------+------------------+------------------+ ICA Prox  130     29              heterogenous                         +----------+--------+--------+--------+------------------+------------------+ ICA Mid   409     107     60-79%                                       +----------+--------+--------+--------+------------------+------------------+ ICA Distal107     29                                                   +----------+--------+--------+--------+------------------+------------------+ ECA       183     36                                                    +----------+--------+--------+--------+------------------+------------------+ +----------+--------+-------+--------+-------------------+  PSV cm/sEDV cmsDescribeArm Pressure (mmHG) +----------+--------+-------+--------+-------------------+ [id number]                                        +----------+--------+-------+--------+-------------------+ +---------+--------+--+--------+--+ VertebralPSV cm/s63EDV cm/s25 +---------+--------+--+--------+--+  Left Carotid Findings: +----------+--------+--------+--------+------------------+--------------+           PSV cm/sEDV cm/sStenosisPlaque DescriptionComments       +----------+--------+--------+--------+------------------+--------------+ CCA Prox  128     11              heterogenous                     +----------+--------+--------+--------+------------------+--------------+ CCA Distal111     18              heterogenous                     +----------+--------+--------+--------+------------------+--------------+ ICA Prox  391     121     80-99%  heterogenous                     +----------+--------+--------+--------+------------------+--------------+ ICA Distal                                          Not visualized +----------+--------+--------+--------+------------------+--------------+ ECA       183     21                                               +----------+--------+--------+--------+------------------+--------------+ +---------+--------+--+--------+--+ VertebralPSV cm/s83EDV cm/s22 +---------+--------+--+--------+--+   Summary: Right Carotid: Velocities in the right ICA are consistent with a 60-79%                stenosis. Left Carotid: Velocities in the left ICA are consistent with a 80-99% stenosis. Vertebrals:  Bilateral vertebral arteries demonstrate antegrade flow. Subclavians: Normal flow hemodynamics were seen in bilateral subclavian              arteries. *See table(s)  above for measurements and observations.     Preliminary    CT ANGIO HEAD CODE STROKE  Result Date: 04/20/2020 CLINICAL DATA:  Initial evaluation for acute stroke, left-sided weakness. EXAM: CT ANGIOGRAPHY HEAD AND NECK CT PERFUSION BRAIN TECHNIQUE: Multidetector CT imaging of the head and neck was performed using the standard protocol during bolus administration of intravenous contrast. Multiplanar CT image reconstructions and MIPs were obtained to evaluate the vascular anatomy. Carotid stenosis measurements (when applicable) are obtained utilizing NASCET criteria, using the distal internal carotid diameter as the denominator. Multiphase CT imaging of the brain was performed following IV bolus contrast injection. Subsequent parametric perfusion maps were calculated using RAPID software. CONTRAST:  165m OMNIPAQUE IOHEXOL 350 MG/ML SOLN COMPARISON:  None. FINDINGS: CTA NECK FINDINGS Aortic arch: Visualized aortic arch normal in caliber with normal branch pattern. No hemodynamically significant stenosis about the origin of the great vessels. Visualized subclavian arteries widely patent. Right carotid system: Right CCA patent from its origin to the bifurcation without stenosis. There is abrupt occlusion of the proximal right ICA just beyond the right bifurcation. Right ICA remains occluded within the neck. Left carotid system: Left CCA patent from its origin to the bifurcation without stenosis. There is  severe near occlusive stenosis of the proximal left ICA just beyond the left bifurcation. A radiographic string sign is present. Stenosis begins just distal to the bifurcation, and measures approximately 2.1 cm in length (series 510, image 399). Left ICA otherwise patent distally to the skull base without stenosis, dissection, or occlusion. Vertebral arteries: Both vertebral arteries arise from the subclavian arteries. Vertebral arteries patent within the neck without stenosis, dissection or occlusion. Skeleton:  Exaggeration of the normal thoracic kyphosis. No visible acute osseous finding. No discrete or worrisome osseous lesions. Other neck: No other acute soft tissue abnormality within the neck. No mass or adenopathy. Poor dentition noted. Upper chest: Visualized upper chest demonstrates no acute finding. Review of the MIP images confirms the above findings CTA HEAD FINDINGS Anterior circulation: Right ICA remains occluded at the skull base. Distal reconstitution at the cavernous segment via collateral flow across the circle-of-Willis. Right M1 segment perfused to the right MCA bifurcation. There is downstream occlusion of a distal right M2/M3 branch, inferior division (series 510, image 181). Some attenuated flow seen distally, which could be related to subocclusive thrombus and/or collateralization. Remainder of the right MCA branches perfused. Petrous left ICA widely patent. Atheromatous change within the cavernous left ICA without significant stenosis. A1 segments patent bilaterally. Normal anterior communicating artery complex. Anterior cerebral arteries patent to their distal aspects without stenosis. Left M1 widely patent. Normal left MCA bifurcation. Distal left MCA branches well perfused. Posterior circulation: Both V4 segments patent to the vertebrobasilar junction without stenosis. Left vertebral slightly dominant. Both PICA origins patent and normal. Basilar patent to its distal aspect without stenosis. Superior cerebellar arteries patent bilaterally. Both PCA supplied via the basilar as well as small bilateral posterior communicating arteries. PCAs well perfused to their distal aspects. Venous sinuses: Grossly patent allowing for timing the contrast bolus. Anatomic variants: None significant.  No intracranial aneurysm. Review of the MIP images confirms the above findings CT Brain Perfusion Findings: ASPECTS: 10. CBF (<30%) Volume: 5m Perfusion (Tmax>6.0s) volume: 822mMismatch Volume: 6746mnfarction  Location:Acute core infarct involving the posterior right MCA distribution at the right frontoparietal region. 67 cc surrounding penumbra. IMPRESSION: CTA HEAD AND NECK IMPRESSION: 1. Positive CTA for with abrupt occlusion of the proximal right ICA just beyond the right bifurcation. Distal reconstitution at the cavernous segment via collateral flow across the circle-of-Willis. Subsequent downstream occlusion at a distal right M2/M3 branch, inferior division. 2. 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 3. Additional mild atheromatous change elsewhere about the major arterial vasculature of the head and neck. No other high-grade or correctable stenosis. 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 Critical Value/emergent results were called by telephone at the time of interpretation on 04/20/2020 at 12:30 am to [facility] verbally acknowledged these results. Electronically Signed   By: BenJeannine BogaD.   On: 04/20/2020 01:24   CT ANGIO NECK CODE STROKE  Result Date: 04/20/2020 CLINICAL DATA:  Initial evaluation for acute stroke, left-sided weakness. EXAM: CT ANGIOGRAPHY HEAD AND NECK CT PERFUSION BRAIN TECHNIQUE: Multidetector CT imaging of the head and neck was performed using the standard protocol during bolus administration of intravenous contrast. Multiplanar CT image reconstructions and MIPs were obtained to evaluate the vascular anatomy. Carotid stenosis measurements (when applicable) are obtained utilizing NASCET criteria, using the distal internal carotid diameter as the denominator. Multiphase CT imaging of the brain  was performed following IV bolus contrast injection. Subsequent parametric perfusion maps were calculated using RAPID software. CONTRAST:  139m OMNIPAQUE IOHEXOL 350 MG/ML SOLN COMPARISON:  None. FINDINGS: CTA  NECK FINDINGS Aortic arch: Visualized aortic arch normal in caliber with normal branch pattern. No hemodynamically significant stenosis about the origin of the great vessels. Visualized subclavian arteries widely patent. Right carotid system: Right CCA patent from its origin to the bifurcation without stenosis. There is abrupt occlusion of the proximal right ICA just beyond the right bifurcation. Right ICA remains occluded within the neck. Left carotid system: Left CCA patent from its origin to the bifurcation without stenosis. There is severe near occlusive stenosis of the proximal left ICA just beyond the left bifurcation. A radiographic string sign is present. Stenosis begins just distal to the bifurcation, and measures approximately 2.1 cm in length (series 510, image 399). Left ICA otherwise patent distally to the skull base without stenosis, dissection, or occlusion. Vertebral arteries: Both vertebral arteries arise from the subclavian arteries. Vertebral arteries patent within the neck without stenosis, dissection or occlusion. Skeleton: Exaggeration of the normal thoracic kyphosis. No visible acute osseous finding. No discrete or worrisome osseous lesions. Other neck: No other acute soft tissue abnormality within the neck. No mass or adenopathy. Poor dentition noted. Upper chest: Visualized upper chest demonstrates no acute finding. Review of the MIP images confirms the above findings CTA HEAD FINDINGS Anterior circulation: Right ICA remains occluded at the skull base. Distal reconstitution at the cavernous segment via collateral flow across the circle-of-Willis. Right M1 segment perfused to the right MCA bifurcation. There is downstream occlusion of a distal right M2/M3 branch, inferior division (series 510, image 181). Some attenuated flow seen distally, which could be related to subocclusive thrombus and/or collateralization. Remainder of the right MCA branches perfused. Petrous left ICA widely patent.  Atheromatous change within the cavernous left ICA without significant stenosis. A1 segments patent bilaterally. Normal anterior communicating artery complex. Anterior cerebral arteries patent to their distal aspects without stenosis. Left M1 widely patent. Normal left MCA bifurcation. Distal left MCA branches well perfused. Posterior circulation: Both V4 segments patent to the vertebrobasilar junction without stenosis. Left vertebral slightly dominant. Both PICA origins patent and normal. Basilar patent to its distal aspect without stenosis. Superior cerebellar arteries patent bilaterally. Both PCA supplied via the basilar as well as small bilateral posterior communicating arteries. PCAs well perfused to their distal aspects. Venous sinuses: Grossly patent allowing for timing the contrast bolus. Anatomic variants: None significant.  No intracranial aneurysm. Review of the MIP images confirms the above findings CT Brain Perfusion Findings: ASPECTS: 10. CBF (<30%) Volume: 12mPerfusion (Tmax>6.0s) volume: 8236mismatch Volume: 10m83mfarction Location:Acute core infarct involving the posterior right MCA distribution at the right frontoparietal region. 67 cc surrounding penumbra. IMPRESSION: CTA HEAD AND NECK IMPRESSION: 1. Positive CTA for with abrupt occlusion of the proximal right ICA just beyond the right bifurcation. Distal reconstitution at the cavernous segment via collateral flow across the circle-of-Willis. Subsequent downstream occlusion at a distal right M2/M3 branch, inferior division. 2. 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 3. Additional mild atheromatous change elsewhere about the major arterial vasculature of the head and neck. No other high-grade or correctable stenosis. 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 Critical Value/emergent results  were called by telephone at the time of interpretation on 04/20/2020 at 12:30 am to provider  [facility] , who verbally acknowledged these results. Electronically Signed   By: Jeannine Boga M.D.   On: 04/20/2020 01:24   IR ANGIO EXTRACRAN SEL COM CAROTID INNOMINATE UNI L MOD SED  Result Date: 04/20/2020 INDICATION: 60 year old male with history acute right MCA syndrome, presents for attempt at mechanical thrombectomy and treatment. EXAM: ULTRASOUND-GUIDED ACCESS RIGHT COMMON FEMORAL ARTERY RIGHT-SIDED CERVICAL AND CEREBRAL ANGIOGRAM LEFT-SIDED CERVICAL ANGIOGRAM BALLOON ANGIOPLASTY OCCLUDED RIGHT CERVICAL ICA MECHANICAL THROMBECTOMY RIGHT MCA M2 DIVISION ANGIO-SEAL FOR HEMOSTASIS COMPARISON:  CT imaging same day MEDICATIONS: 300 mg rectal aspirin ANESTHESIA/SEDATION: The anesthesia team was present to provide general endotracheal tube anesthesia and for patient monitoring during the procedure. Intubation was performed in negative pressure [place] in neuro IR holding. Left radial arterial line was performed by the anesthesia team. Interventional neuro radiology nursing staff was also present. CONTRAST:  100 cc IV contrast FLUOROSCOPY TIME:  Fluoroscopy Time: 18 minutes 12 seconds (889 mGy). COMPLICATIONS: SIR LEVEL B - Normal therapy, includes overnight admission for observation. Right-sided sylvian subarachnoid hemorrhage TECHNIQUE: Informed written consent was obtained from the patient's family after a thorough discussion of the procedural risks, benefits and alternatives. Specific risks discussed include: Bleeding, infection, contrast reaction, kidney injury/failure, need for further procedure/surgery, arterial injury or dissection, embolization to new territory, intracranial hemorrhage (10-15% risk), neurologic deterioration, cardiopulmonary collapse, death. All questions were addressed. Maximal Sterile Barrier Technique was utilized including during the procedure including caps, mask, sterile gowns,  sterile gloves, sterile drape, hand hygiene and skin antiseptic. A timeout was performed prior to the initiation of the procedure. The anesthesia team was present to provide general endotracheal tube anesthesia and for patient monitoring during the procedure. Interventional neuro radiology nursing staff was also present. FINDINGS: Initial Findings: Right common carotid artery:  Normal course caliber and contour. Right external carotid artery: Patent with antegrade flow. Right internal carotid artery: Right ICA occluded at the cervical ICA. Calcified plaque present at the bulb. Right MCA: After initial crossing and angioplasty of the cervical ICA, repeat angiogram was performed at the skull base. This demonstrated a transient M1 occlusion, which then again migrated distally into the inferior division. Initial perfusion of the targeted vessel is TICI 0: No perfusion or antegrade flow beyond site of occlusion Right ACA:  A 1 segment patent. Completion Findings: Right MCA: Patent. The targeted artery, inferior division, dominant artery to the parietal region, final score of TICI 3: Complete perfusion of the territory Flat panel CT demonstrates small volume subarachnoid hemorrhage in the right sylvian sulci. Final angiogram of the cervical ICA after balloon angioplasty demonstrates persisting stenosis, measured 42% by NASCET CT criteria. TICI 3 Flat panel CT Findings: Left common carotid artery:  Normal course caliber and contour. Left external carotid artery: Patent with antegrade flow. Left internal carotid artery: Irregular string sign at the proximal left cervical ICA, with high-grade stenosis. NASCET criteria measurement is 79% stenosis PROCEDURE: The anesthesia team was present to provide general endotracheal tube anesthesia and for patient monitoring during the procedure. Intubation was performed in negative pressure [place] in neuro IR holding. Interventional neuro radiology nursing staff was also present. Ultrasound  survey of the right inguinal region was performed with images stored and sent to PACs. 11 blade scalpel was used to make a small incision. Blunt dissection was performed with US guidance. A micropuncture needle was used access the right common femoral artery under ultrasound. With excellent arterial blood flow returned, an .018 micro wire was passed through the needle, observed to enter the  abdominal aorta under fluoroscopy. The needle was removed, and a micropuncture sheath was placed over the wire. The inner dilator and wire were removed, and an 035 wire was advanced under fluoroscopy into the abdominal aorta. The sheath was removed and a 25cm 53F straight vascular sheath was placed. The dilator was removed and the sheath was flushed. Sheath was attached to pressurized and heparinized saline bag for constant forward flow. 125 cm parents stent diagnostic catheter was used to select the innominate artery. The catheter was advanced into the common carotid artery using a roadrunner diagnostic wire. Wire was removed and angiogram was performed in the distal common carotid artery. The catheter was then directed towards the external carotid artery and a rose in wire was placed. A 95cm 087 "Walrus" balloon guide was then advanced on the Rose an wire. Once the balloon guide was position in the distal cervical ICA, rose in wire was removed. Baseline angiogram was again performed with roadmap achieved. A rapid transit microcatheter with a coaxial synchro soft wire was used to probe and cross the proximal cervical ICA occlusion. The wire was advanced distally with the rapid transit catheter, to the distal cervical ICA. Wire was removed and initial angiogram was performed. This proved a luminal position. A exchange length, 300 cm transcend wire with a floppy tip was then placed and the rapid transit was removed. Standard balloon angioplasty with a 4 mm by 40 mm Viatrac was performed at the carotid bulb. The balloon was inflated  to 10.2 atmosphere. The balloon was deflated and the balloon guide was then advanced on the wire and the balloon shaft. A distal position of the cervical ICA was achieved. The balloon and the transcend wire were removed. Adequate flush was achieved at the balloon guide and then a new diagnostic angiogram was performed for road mapping of the intracranial occlusion. We identified the target as inferior division, M2, with a dominant perfusion to the parietal lobe. Copious back flush was performed and the balloon catheter was attached to heparinized and pressurized saline bag for forward flow. A second coaxial system was then advanced through the balloon catheter, which included the selected intermediate catheter, microcatheter, and microwire. In this scenario, the set up included a zoom 55 intermediate catheter, a Trevo Provue18 microcatheter, and 014 synchro soft wire. This system was advanced through the balloon guide catheter under the road-map function, with adequate back-flush at the rotating hemostatic valve at that back end of the balloon guide. Microcatheter and the intermediate catheter system were advanced through the terminal ICA and MCA to the level of the inferior division M2 occlusion. The micro wire was then carefully advanced through the occluded segment. Microcatheter was then manipulated through the occluded segment and the wire was removed with saline drip at the hub. Blood was then aspirated through the hub of the microcatheter, and a gentle contrast injection was performed confirming intraluminal position. A rotating hemostatic valve was then attached to the back end of the microcatheter, and a pressurized and heparinized saline bag was attached to the catheter. 3 x 20 solitaire device was then selected. Back flush was achieved at the rotating hemostatic valve, and then the device was gently advanced through the microcatheter to the distal end. The retriever was then unsheathed by withdrawing the  microcatheter under fluoroscopy. Once the retriever was completely unsheathed, the microcatheter was carefully stripped from the delivery device. Control angiogram was performed from the intermediate catheter. A 3 minute time interval was observed. The balloon at the  balloon guide catheter was then inflated under fluoroscopy for proximal flow arrest. Constant aspiration using the proprietary engine was then performed at the intermediate catheter, as the retriever was gently and slowly withdrawn with fluoroscopic observation. Once the retriever was "corked" within the tip of the intermediate catheter, both were removed from the system. Free aspiration was confirmed at the hub of the balloon guide catheter, with free blood return confirmed. The balloon was then deflated, and a control angiogram was performed. Persisting occlusion of the inferior division was observed. We elected to perform a second pass. Second pass: The same coaxial system was advanced using a zoom 55 intermediate catheter, a Trevo Provue18 microcatheter, and 014 synchro soft wire. This system was advanced through the balloon guide catheter under the road-map function, with adequate back-flush at the rotating hemostatic valve at that back end of the balloon guide. Microcatheter and the intermediate catheter system were advanced through the terminal ICA and MCA to the level of the inferior division M2 occlusion. The micro wire and catheter were then removed. A direct aspiration strategy was then used with the zoom 55 catheter. The proprietary engine was turned on and the catheter was advanced slowly until there was no longer aspiration of blood products. Approximately 20 seconds-30 seconds was observed and the catheter was removed. Adequate aspiration was then performed at the hub of the balloon guide and another angiogram was performed. Restoration of flow was confirmed. Transcend wire was then advanced through the balloon guide and the balloon guide  was withdrawn to the common carotid artery. Angiogram of the cervical ICA was performed. Flat panel CT was performed. After the imaging was performed of the head and the angiogram of the cervical ICA was reviewed, results were discussed with the neurology team. At this point, given the estimated 40% narrowing of the cervical ICA and the subarachnoid hemorrhage, we elected to terminate the case for observation, and surveillance of the carotid disease. The balloon guide and the wire were removed. A left-sided cervical angiogram was then performed using a JB 1 catheter. Catheter was removed. The skin at the puncture site was then cleaned with Chlorhexidine. The 8 French sheath was removed and an 70F angioseal was deployed. Patient was extubated. Patient tolerated the procedure well and remained hemodynamically stable throughout. No complications were encountered and no significant blood loss encountered. IMPRESSION: Status post ultrasound guided access right common femoral artery for bilateral cervical/cerebral angiogram, and treatment of right-sided cervical ICA/MCA tandem occlusion, treating the cervical ICA occlusion with 4 mm balloon angioplasty, and restoring TICI 3 perfusion to the inferior division M2 branch with 2 passes of combination stent retriever and direct aspiration. Angio-Seal for closure. Signed, Dulcy Fanny. Dellia Nims, [facility] Radiology PLAN: Patient extubated. ICU status Target systolic blood pressure of 120-140 Right hip straight time 6 hours Frequent neurovascular checks Repeat neurologic imaging with CT and/MRI at the discretion of neurology team Continue aspirin antiplatelets Carotid duplex for carotid surveillance Electronically Signed   By: Corrie Mckusick D.O.   On: 04/20/2020 05:29       HISTORY OF PRESENT ILLNESS Tennis [place] is a 60 y.o. male with PMH significant for prior stroke in august 2021 with very mild residual deficit after  finishing rehab who presents with acute onset Left sided weakness with extinction and a LKW of 1830.  Patient was dropped off at his apartment at [place] and was found down by family and [employer] with left sided weakness. He was brought  in as a stroke code.  CTH w/o contrast was negative for a large hypodensity concerning for a large territory infarct or hyperdensity concerning for an ICH, CT angio with a R MCA M2/M3 occlusion, R ICA occlusion proximally with reconstitution intracranially and L ICA string sign at bifurcation. CT Perfusion demonstrated a core of 41m with a mismatch of 622m  NIHSS: 14 MRS: 0 TPA: outside window at presentation Thrombectomy: Dr. WaEarleen Newportiscussed risks and benefits with patient's daughter Ms. DaArlyce Dicever the phone who consented to proceed with thrombectomy.  HOSPITAL COURSE JoEd [place] [id number].[email or web address].malewith PMH significant for prior stroke in august 2021 with very mild residual deficit after finishing rehab who presents with acuteR MCA stroke with NIHSS of 14 with left sided weakness+ numbness, R gaze deviationwith extinction and a LKW of 1830. CTH with ASPECTS of 10. ATS with R M2/3 occlusion, R ICA occlusion with distal intracranial reconstitution, L ICA string sign at the bifurcation. Outside tPA at arrival. Taken to IR for thrombectomy.  Stroke:  Right MCA stroke with right M2/3 occlusion and right ICA occusion s/p TPA and thrombectomy achieving a TICI 3 right MCA revascularization, likely due to large vessel disease source     CT no acute abnormality.    CT head and neck right M2/M3 occlusion and right ICA occlusion with distal reconstitution.  Left ICA string sign.    IR with TICI3 right MCA but small SAH.  At end of IR, right ICA about 42% stenosis, no stent placed.  Still has left ICA string sign.    Carotid Doppler post op showed right ICA 60 to 79% stenosis, left ICA 80 to 99% stenosis.    MRI showed right MCA patchy small infarct with possible  petechial hemorrhage.  However, there is also a small right cerebellum infarct, not sure embolic stroke or related to procedure.    EF 55 to 60%.    UDS showed positive for cocaine and THC.    Hypercoagulable work-up pending  LDL 98  HgbA1c 7.3  VTE prophylaxis - Lovenox '40mg'$  daily   On ASA 81 mg and Plavix '75mg'$  daily PTA, now on ASA 325 and plavix 75 DAPT for 3 months and then regimen per Dr. WaEarleen NewportTherapy recommendations:  CIR  Disposition:  TBD  Bilateral carotid stenosis  CT head and neck right ICA occlusion with distal reconstitution.  Left ICA string sign.    At the end of IR showed right ICA about 42% stenosis. Still has left ICA string sign.    Carotid Doppler post op showed right ICA 60 to 79% stenosis, left ICA 80 to 99% stenosis.    Discussed with Dr. WaEarleen Newportwill follow up closely as outpt for potential revascularization treatment. Plan to see him after CIR.  Hx of stroke  stroke in 09/2019 with mild residual deficit  Details unclear  Hypertension  On cleviprex drip, weaning per RN  Resumed Home medications Norvasc 10   blood pressure control with systolic goal below 160000000 Long term BP goal 130-160 given b/l carotid stenosis  Hyperlipidemia  Home meds:  Atorvastatin '40mg'$  daily  LDL 98, goal < 70  Continue home atorvastatin 40  Continue statin at discharge  Diabetes type II Uncontrolled  Home meds:  Metformin 500 BID  HgbA1c 7.3 not quite at goal < 7.0  CBGs  SSI  Close PCP follow up  Cocaine abuse  UDS positive for cocaine  Cocaine cessation education provided  Patient is willing to  treat  Tobacco abuse  Current smoker  Smoking cessation counseling provided  Pt is willing to quit  Other Stroke Risk Factors  UDS positive for THC, cessation education provided   Other Active Problems  Leukocytosis, WBC 13.9-11.6  CKD stage IIIb, creatinine 2.01->2.49-> 2.31->2.11   DISCHARGE EXAM Blood pressure 134/76,  pulse 70, temperature 98.7 F (37.1 C), temperature source Oral, resp. rate 16, height '5\' 6"'$  (1.676 m), weight 62.3 kg, SpO2 98 %.  Constitutional: Calm, mildly drowsy, arouses easily to verbal stimuli.  Cardiovascular: Regular rate and rhythm on tele  Respiratory: No extra work of breathing on RA.    Neuro: sleepy drowsy but easily arousable, orientated to age, place, time and people. No aphasia, following all simple commands. Able to name and repeat. Mild to moderate dysarthria. No gaze palsy, tracking bilaterally, visual field full, PERRL. Left facial droop. Tongue midline. RUE and RLE 5/5, no drift. LUE and LLE no spontaneous movement but withdraw to pain, 1/5 LUE and 2/5 LLE. Sensation symmetrical bilaterally, right FTN intact, gait not tested.   Discharge Diet      Diet   Diet regular Room service appropriate? Yes with Assist; Fluid consistency: Thin   liquids  DISCHARGE PLAN  Disposition:  Transfer to [facility] for ongoing PT, OT and ST  On ASA 81 mg and Plavix '75mg'$  daily PTA, now on ASA 325 and plavix 75 DAPT for 3 months and then regimen per Dr. Earleen Newport  Recommend ongoing stroke risk factor control by Primary Care Physician at time of discharge from inpatient rehabilitation.  Follow-up PCP in 2 weeks following discharge from rehab.  Follow-up in [facility] in 4 weeks following discharge from rehab, office to schedule an appointment.   Follow up with Dr. Earleen Newport as outpt for carotid stenosis after CIR  35 minutes were spent preparing discharge.  Rosalin Hawking, MD PhD Stroke Neurology 04/23/2020 12:13 PM

## 2020-04-23 NOTE — Progress Notes (Signed)
NIR.  History of acute CVA s/p cerebral arteriogram with emergent mechanical thrombectomy of right ICA bifurcation occlusion and right MCA M2 occlusion achieving a TICI 3 revascularization via right femoral approach 04/20/2020 by Dr. Earleen Newport.  Plan to follow-up with Dr. Earleen Newport in clinic 4 weeks after discharge to discuss management of right ICA stenosis (IR schedulers to call patient to set up this appointment). Further plans per neurology- appreciate and agree with management. Please call NIR with questions/concerns.   Bea Graff Catarino Vold, PA-C 04/23/2020, 9:37 AM

## 2020-04-23 NOTE — Progress Notes (Signed)
Bobby Gong, RN  Rehab Admission Coordinator  Physical Medicine and Rehabilitation  PMR Pre-admission      Signed  Date of Service:  04/23/2020 12:53 PM      Related encounter: ED to Hosp-Admission (Current) from 04/20/2020 in [place] [facility]       Signed          Show:Clear all '[x]'$ Manual'[x]'$ Template'[x]'$ Copied  Added by: '[x]'$ Bobby Gong, RN   '[]'$ Hover for details  PMR Admission Coordinator Pre-Admission Assessment   Patient: Bobby Branch is an 60 y.o., male MRN: [id number] DOB: 11/13/1960 Height: '5\' 6"'$  (167.6 cm) Weight: 62.3 kg                                                                                                                                                  Insurance Information   PRIMARY: uninsured. refered for disability and Medicaid applications by [facility] RN CM         Daily cost of care estimator reviewed with patient and his girlfriend, Health and safety inspector per protocol   Financial Counselor:       Phone#:  Referred for disability and Medicaid id applications by TOC RN SM, Vida Roller   The "Data Collection Information Summary" for patients in Inpatient Rehabilitation Facilities with attached "Privacy Act [place] Records" was provided and verbally reviewed with: N/Branch   Emergency Contact Information         Contact Information     Name Relation Home Work [place], [place] "[facility]" Daughter     [phone]    Marena Chancy Significant other     [phone]       Current Medical History  Patient Admitting Diagnosis: CVA   History of Present Illness: : Bobby Branch is Branch 60 year old right-handed male with 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   Presented 04/20/2020 with acute onset of left-sided weakness.  Cranial CT scan showed asymmetric hyper density involving the right M2/M3 branches  concerning for thrombus.  Few remote lacunar infarcts about the bilateral basal ganglia.  CT angiogram of head and neck as well as CT cerebral perfusion scan showed acute 15 cc core infarct involving the posterior right MCA distribution with Branch 67 cc surrounding ischemic penumbra.  Patient underwent TICI 3 right MCA revascularization per interventional radiology.  MRI follow-up showed right MCA patchy small acute infarct with possible petechial hemorrhage.  Carotid Dopplers with right 60 to 79% left ICA 80 to 99% stenosis.  Echocardiogram with ejection fraction of 55 to 60% no wall motion abnormalities grade 1 diastolic dysfunction.  Admission chemistries glucose 231 creatinine 2.10 hemoglobin 13.6 hemoglobin A1c 7.3 urine drug screen positive cocaine as well as marijuana.  Initially maintained on Cleviprex for blood pressure control.  Neurology follow-up presently on aspirin 325 mg daily and Plavix 75 mg daily x3 months.  In regards to patient's bilateral carotid stenosis Dr. Earleen Newport of interventional radiology to follow-up outpatient for potential revascularization treatment.  He is tolerating Branch regular consistency diet.    Complete NIHSS TOTAL: 10 Glasgow Coma Scale Score: 15   Past Medical History      Past Medical History:  Diagnosis Date  . Stroke [facility]) 09/2019      Family History  family history is not on file.   Prior Rehab/Hospitalizations:  Has the patient had prior rehab or hospitalizations prior to admission? Yes   Has the patient had major surgery during 100 days prior to admission? Yes   Current Medications    Current Facility-Administered Medications:  .   stroke: mapping our early stages of recovery book, , Does not apply, Once, Bobby Simpers, Bobby Branch .  acetaminophen (TYLENOL) tablet 650 mg, 650 mg, Oral, Q4H PRN, 650 mg at 04/22/20 1220 **OR** acetaminophen (TYLENOL) 160 MG/5ML solution 650 mg, 650 mg, Per Tube, Q4H PRN **OR** acetaminophen (TYLENOL) suppository 650 mg, 650  mg, Rectal, Q4H PRN, Bobby Simpers, Bobby Branch .  alum & mag hydroxide-simeth (MAALOX/MYLANTA) 200-200-20 MG/5ML suspension 30 mL, 30 mL, Oral, Q4H PRN, Bobby Simpers, Bobby Branch, 30 mL at 04/21/20 0151 .  amLODipine (NORVASC) tablet 10 mg, 10 mg, Oral, Daily, Bobby Branch, Vineet, Bobby Branch, 10 mg at 04/23/20 1036 .  aspirin EC tablet 325 mg, 325 mg, Oral, Daily, Bobby Hawking, Bobby Branch, 325 mg at 04/23/20 1035 .  atorvastatin (LIPITOR) tablet 40 mg, 40 mg, Oral, q1800, Bobby Hawking, Bobby Branch, 40 mg at 04/22/20 1735 .  Chlorhexidine Gluconate Cloth 2 % PADS 6 each, 6 each, Topical, Daily, Bobby Fila, Bobby Branch, 6 each at 04/22/20 2014 .  clevidipine (CLEVIPREX) infusion 0.5 mg/mL, 0-21 mg/hr, Intravenous, Continuous, Bobby Hawking, Bobby Branch, Stopped at 04/22/20 1257 .  clopidogrel (PLAVIX) tablet 75 mg, 75 mg, Oral, Daily, Bobby Hawking, Bobby Branch, 75 mg at 04/23/20 1036 .  docusate sodium (COLACE) capsule 100 mg, 100 mg, Oral, BID, Branch, Ivelisse, NP, 100 mg at 04/23/20 1036 .  escitalopram (LEXAPRO) tablet 10 mg, 10 mg, Oral, Daily, Bobby Hawking, Bobby Branch, 10 mg at 04/23/20 1036 .  famotidine (PEPCID) tablet 20 mg, 20 mg, Oral, Daily, Bobby Hawking, Bobby Branch, 20 mg at 04/23/20 1036 .  insulin aspart (novoLOG) injection 0-15 Units, 0-15 Units, Subcutaneous, Q4H, Bobby Branch, Bobby A, NP, 2 Units at 04/23/20 1157 .  iohexol (OMNIPAQUE) 300 MG/ML solution 150 mL, 150 mL, Intra-arterial, Once PRN, Bobby Mckusick, Bobby Branch .  labetalol (NORMODYNE) injection 5-20 mg, 5-20 mg, Intravenous, Q2H PRN, Bobby Hawking, Bobby Branch, 10 mg at 04/22/20 0752 .  MEDLINE mouth rinse, 15 mL, Mouth Rinse, BID, Bobby Hawking, Bobby Branch, 15 mL at 04/23/20 1036 .  polyethylene glycol (MIRALAX / GLYCOLAX) packet 17 g, 17 g, Oral, Once, Bobby Branch, [place], Bobby Branch .  senna-docusate (Senokot-S) tablet 1 tablet, 1 tablet, Oral, QHS PRN, Bobby Simpers, Bobby Branch, 1 tablet at 04/22/20 2314 .  sodium phosphate (FLEET) 7-19 GM/118ML enema 1 enema, 1 enema, Rectal, Once, Bobby Hawking, Bobby Branch   Patients Current Diet:      Diet Order                      Diet regular Room service appropriate? Yes with Assist; Fluid consistency: Thin  Diet effective now                      Precautions / Restrictions  Precautions Precautions: Fall Restrictions Weight Bearing Restrictions: No    Has the patient had 2 or more falls or Branch fall with injury in the past year?No   Prior Activity Level Community (5-7x/wk): independent; does not drive   Prior Functional Level Prior Function Level of Independence: Independent Comments: Pt performing ADLs. No use of DME for mobility. Girlfriend assisting with IADLs and driving   Self Care: Did the patient need help bathing, dressing, using the toilet or eating?  Needed some help   Indoor Mobility: Did the patient need assistance with walking from room to room (with or without device)? Independent   Stairs: Did the patient need assistance with internal or external stairs (with or without device)? Independent   Functional Cognition: Did the patient need help planning regular tasks such as shopping or remembering to take medications? Needed some help   Home Assistive Devices / Equipment Home Equipment: None   Prior Device Use: Indicate devices/aids used by the patient prior to current illness, exacerbation or injury? none   Current Functional Level Cognition   Arousal/Alertness: Awake/alert Overall Cognitive Status: Impaired/Different from baseline Orientation Level: Oriented X4 General Comments: Pt following simple commands. Demonstrating slow processing. Very motivated Attention: Focused,Sustained Focused Attention: Impaired Focused Attention Impairment: Verbal complex Sustained Attention: Impaired Sustained Attention Impairment: Verbal complex Memory: Impaired Memory Impairment: Retrieval deficit,Decreased recall of new information (Immediate: 5/5; delayed: 4/5; with cue: 1/1) Awareness: Impaired Awareness Impairment: Emergent impairment Problem Solving:  Appears intact Executive Function: Sequencing,Organizing Sequencing: Impaired Sequencing Impairment: Verbal complex (Clock drawing: 0/4) Organizing: Impaired Organizing Impairment: Verbal complex (Backward digit span: 1/2 with self-correction)    Extremity Assessment (includes Sensation/Coordination)   Upper Extremity Assessment: LUE deficits/detail LUE Deficits / Details: No active movement. Noting tendency for flexed position. Tone into flexion at hand and elbow. Fist position and pain with extension of digits, wrist, and elbow. pain with forward flexion over 80* LUE Coordination: decreased fine motor,decreased gross motor  Lower Extremity Assessment: Defer to PT evaluation LLE Deficits / Details: hip flexion 2-, knee extension 2+, ankle DF 0 LLE Sensation:  (unable to assess due to urgency to get to [facility])     ADLs   Overall ADL's : Needs assistance/impaired Eating/Feeding: Moderate assistance,Sitting Grooming: Moderate assistance,Sitting Upper Body Bathing: Moderate assistance,Sitting Upper Body Bathing Details (indicate cue type and reason): Pt washing his chest while sitting at [facility]. Requiring assistance for managing LUE and washing back Lower Body Bathing: Maximal assistance,Sit to/from stand Upper Body Dressing : Maximal assistance,Sitting Lower Body Dressing: Maximal assistance,Sit to/from stand Toilet Transfer: Moderate assistance,+2 for physical assistance,Squat-pivot,BSC Functional mobility during ADLs: Moderate assistance (squat pivot) General ADL Comments: Pt presenting with decreased cognition, balance, strength, and functional use of LUE/LLE.     Mobility   Overal bed mobility: Needs Assistance Bed Mobility: Supine to Sit Supine to sit: Mod assist General bed mobility comments: Cues for use of RLE to hook under LLE and bring to EOB. Mod Branch for bringing hips towards EOB and then elevate trunk.     Transfers   Overall transfer level: Needs assistance Equipment used:  None Transfers: Sit to/from [employer] Sit to Stand: Mod assist Stand pivot transfers: Mod assist,+2 physical assistance Squat pivot transfers: Mod assist General transfer comment: Blocking of L knee. Mod Branch to power up; cues for reaching RUE to recliner arm. Pt then able to facilitating moving of hips to recliner     Ambulation / Gait / Stairs / Emergency planning/management officer   Ambulation/Gait General  Gait Details: unable     Posture / Balance Dynamic Sitting Balance Sitting balance - Comments: Flucuating between poor and fair sitting balance. able to maintain static sitting balance and then pt noting his left lateral lean and correcting with reaching for bedrail using RUE Balance Overall balance assessment: Needs assistance Sitting-balance support: No upper extremity supported,Feet supported Sitting balance-Leahy Scale: Fair Sitting balance - Comments: Flucuating between poor and fair sitting balance. able to maintain static sitting balance and then pt noting his left lateral lean and correcting with reaching for bedrail using RUE Postural control: Left lateral lean Standing balance support: Single extremity supported,During functional activity Standing balance-Leahy Scale: Poor Standing balance comment: see transfer     Special needs/care consideration Hgb A1c 7.3 Positive cocaine and marijuana on admit Recent move 3 months ago form [place] and does not have Branch PCP    Previous Home Environment  Living Arrangements: Spouse/significant other (girlfriend, Health and safety inspector)  Lives With: Significant other (girlfriend, Health and safety inspector) Available Help at Discharge: Family,Available 24 hours/day Type of Home: Apartment Home Layout: One level Home Access: Level entry Bathroom Shower/Tub: Walk-in shower,Tub/shower Personal assistant: Standard Bathroom Accessibility: Yes How Accessible: Accessible via walker Home Care Services: No Additional Comments: Girlfriend confirming information   Discharge  Living Setting Plans for Discharge Living Setting: Apartment,Lives with (comment) (girlfriend, Chastity) Type of Home at Discharge: Apartment Discharge Home Layout: One level Discharge Home Access: Level entry Discharge Bathroom Shower/Tub: Tub/shower unit,Walk-in shower Discharge Bathroom Toilet: Standard Discharge Bathroom Accessibility: Yes How Accessible: Accessible via walker Does the patient have any problems obtaining your medications?: Yes (Describe) (uninsured)   Emergency planning/management officer Information: girlfriend, Health and safety inspector Anticipated Caregiver: girlfriend Anticipated Ambulance person Information: see above Ability/Limitations of Caregiver: none Caregiver Availability: 24/7 Discharge Plan Discussed with Primary Caregiver: Yes Is Caregiver In Agreement with Plan?: Yes Does Caregiver/Family have Issues with Lodging/Transportation while Pt is in Rehab?: No   Goals Patient/Family Goal for Rehab: supervision with PT and OT Expected length of stay: ELOS 10 to 14 days Pt/Family Agrees to Admission and willing to participate: Yes Program Orientation Provided & Reviewed with Pt/Caregiver Including Roles  & Responsibilities: Yes   Decrease burden of Care through IP rehab admission: n/Branch   Possible need for SNF placement upon discharge:not anticipated   Patient Condition: This patient's condition remains as documented in the consult dated 04/23/2020, in which the Rehabilitation Physician determined and documented that the patient's condition is appropriate for intensive rehabilitative care in an inpatient rehabilitation facility. Will admit to inpatient rehab today.   Preadmission Screen Completed By:  Bobby Burke, RN, 04/23/2020 12:53 PM ______________________________________________________________________   Discussed status with Dr. Ranell Patrick on 04/23/2020 at  1258 and received approval for admission today.   Admission Coordinator:  Bobby Branch,  time [id number] Date 04/23/2020             Cosigned by: Bobby Ribas, Bobby Branch at 04/23/2020  1:08 PM    Revision History                    Note Details  Author Bobby Gong, RN File Time 04/23/2020 12:58 PM  Author Type Rehab Admission Coordinator Status Signed  Last Editor Bobby Gong, RN Service Physical Medicine and Rehabilitation

## 2020-04-23 NOTE — PMR Pre-admission (Signed)
PMR Admission Coordinator Pre-Admission Assessment  Patient: Bobby Branch is an 60 y.o., male MRN: [id number] DOB: [birth year] Height: '5\' 6"'$  (167.6 cm) Weight: 62.3 kg              Insurance Information  PRIMARY: uninsured. refered for disability and Medicaid applications by [facility] RN CM        Daily cost of care estimator reviewed with patient and his girlfriend, Health and safety inspector per protocol  Financial Counselor:       Phone#:  Referred for disability and Medicaid id applications by TOC RN SM, Vida Roller  The "Data Collection Information Summary" for patients in Inpatient Rehabilitation Facilities with attached "Privacy Act [place] Records" was provided and verbally reviewed with: N/A  Emergency Contact Information Contact Information    Name Relation Home Work [place], [place] [facility]" Daughter   [phone]   Marena Chancy Significant other   [phone]     Current Medical History  Patient Admitting Diagnosis: CVA  History of Present Illness: : Bobby Branch is a 59 year old right-handed male with 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   Presented 04/20/2020 with acute onset of left-sided weakness.  Cranial CT scan showed asymmetric hyper density involving the right M2/M3 branches concerning for thrombus.  Few remote lacunar infarcts about the bilateral basal ganglia.  CT angiogram of head and neck as well as CT cerebral perfusion scan showed acute 15 cc core infarct involving the posterior right MCA distribution with a 67 cc surrounding ischemic penumbra.  Patient underwent TICI 3 right MCA revascularization per interventional radiology.  MRI follow-up showed right MCA patchy small acute infarct with possible petechial hemorrhage.  Carotid Dopplers with right 60 to 79% left ICA 80 to 99% stenosis.  Echocardiogram with ejection fraction of 55 to 60% no  wall motion abnormalities grade 1 diastolic dysfunction.  Admission chemistries glucose 231 creatinine 2.10 hemoglobin 13.6 hemoglobin A1c 7.3 urine drug screen positive cocaine as well as marijuana.  Initially maintained on Cleviprex for blood pressure control.  Neurology follow-up presently on aspirin 325 mg daily and Plavix 75 mg daily x3 months.  In regards to patient's bilateral carotid stenosis Dr. Earleen Newport of interventional radiology to follow-up outpatient for potential revascularization treatment.  He is tolerating a regular consistency diet.   Complete NIHSS TOTAL: 10 Glasgow Coma Scale Score: 15  Past Medical History  Past Medical History:  Diagnosis Date  . Stroke [facility]) 09/2019    Family History  family history is not on file.  Prior Rehab/Hospitalizations:  Has the patient had prior rehab or hospitalizations prior to admission? Yes  Has the patient had major surgery during 100 days prior to admission? Yes  Current Medications   Current Facility-Administered Medications:  .   stroke: mapping our early stages of recovery book, , Does not apply, Once, Donnetta Simpers, MD .  acetaminophen (TYLENOL) tablet 650 mg, 650 mg, Oral, Q4H PRN, 650 mg at 04/22/20 1220 **OR** acetaminophen (TYLENOL) 160 MG/5ML solution 650 mg, 650 mg, Per Tube, Q4H PRN **OR** acetaminophen (TYLENOL) suppository 650 mg, 650 mg, Rectal, Q4H PRN, Donnetta Simpers, MD .  alum & mag hydroxide-simeth (MAALOX/MYLANTA) 200-200-20 MG/5ML suspension 30 mL, 30 mL, Oral, Q4H PRN, Donnetta Simpers, MD, 30 mL at 04/21/20 0151 .  amLODipine (NORVASC) tablet 10 mg, 10 mg, Oral, Daily, Halford Chessman, Vineet, MD, 10 mg at 04/23/20 1036 .  aspirin EC tablet 325 mg, 325  mg, Oral, Daily, Rosalin Hawking, MD, 325 mg at 04/23/20 1035 .  atorvastatin (LIPITOR) tablet 40 mg, 40 mg, Oral, q1800, Rosalin Hawking, MD, 40 mg at 04/22/20 1735 .  Chlorhexidine Gluconate Cloth 2 % PADS 6 each, 6 each, Topical, Daily, Garvin Fila, MD, 6 each at  04/22/20 2014 .  clevidipine (CLEVIPREX) infusion 0.5 mg/mL, 0-21 mg/hr, Intravenous, Continuous, Rosalin Hawking, MD, Stopped at 04/22/20 1257 .  clopidogrel (PLAVIX) tablet 75 mg, 75 mg, Oral, Daily, Rosalin Hawking, MD, 75 mg at 04/23/20 1036 .  docusate sodium (COLACE) capsule 100 mg, 100 mg, Oral, BID, Olivencia-Simmons, Ivelisse, NP, 100 mg at 04/23/20 1036 .  escitalopram (LEXAPRO) tablet 10 mg, 10 mg, Oral, Daily, Rosalin Hawking, MD, 10 mg at 04/23/20 1036 .  famotidine (PEPCID) tablet 20 mg, 20 mg, Oral, Daily, Rosalin Hawking, MD, 20 mg at 04/23/20 1036 .  insulin aspart (novoLOG) injection 0-15 Units, 0-15 Units, Subcutaneous, Q4H, Bailey-Modzik, Delila A, NP, 2 Units at 04/23/20 1157 .  iohexol (OMNIPAQUE) 300 MG/ML solution 150 mL, 150 mL, Intra-arterial, Once PRN, Corrie Mckusick, DO .  labetalol (NORMODYNE) injection 5-20 mg, 5-20 mg, Intravenous, Q2H PRN, Rosalin Hawking, MD, 10 mg at 04/22/20 0752 .  MEDLINE mouth rinse, 15 mL, Mouth Rinse, BID, Rosalin Hawking, MD, 15 mL at 04/23/20 1036 .  polyethylene glycol (MIRALAX / GLYCOLAX) packet 17 g, 17 g, Oral, Once, Adefeso, Oladapo, DO .  senna-docusate (Senokot-S) tablet 1 tablet, 1 tablet, Oral, QHS PRN, Donnetta Simpers, MD, 1 tablet at 04/22/20 2314 .  sodium phosphate (FLEET) 7-19 GM/118ML enema 1 enema, 1 enema, Rectal, Once, Rosalin Hawking, MD  Patients Current Diet:  Diet Order            Diet regular Room service appropriate? Yes with Assist; Fluid consistency: Thin  Diet effective now                 Precautions / Restrictions Precautions Precautions: Fall Restrictions Weight Bearing Restrictions: No   Has the patient had 2 or more falls or a fall with injury in the past year?No  Prior Activity Level Community (5-7x/wk): independent; does not drive  Prior Functional Level Prior Function Level of Independence: Independent Comments: Pt performing ADLs. No use of DME for mobility. Girlfriend assisting with IADLs and driving  Self  Care: Did the patient need help bathing, dressing, using the toilet or eating?  Needed some help  Indoor Mobility: Did the patient need assistance with walking from room to room (with or without device)? Independent  Stairs: Did the patient need assistance with internal or external stairs (with or without device)? Independent  Functional Cognition: Did the patient need help planning regular tasks such as shopping or remembering to take medications? Needed some help  Home Assistive Devices / Equipment Home Equipment: None  Prior Device Use: Indicate devices/aids used by the patient prior to current illness, exacerbation or injury? none  Current Functional Level Cognition  Arousal/Alertness: Awake/alert Overall Cognitive Status: Impaired/Different from baseline Orientation Level: Oriented X4 General Comments: Pt following simple commands. Demonstrating slow processing. Very motivated Attention: Focused,Sustained Focused Attention: Impaired Focused Attention Impairment: Verbal complex Sustained Attention: Impaired Sustained Attention Impairment: Verbal complex Memory: Impaired Memory Impairment: Retrieval deficit,Decreased recall of new information (Immediate: 5/5; delayed: 4/5; with cue: 1/1) Awareness: Impaired Awareness Impairment: Emergent impairment Problem Solving: Appears intact Executive Function: Sequencing,Organizing Sequencing: Impaired Sequencing Impairment: Verbal complex (Clock drawing: 0/4) Organizing: Impaired Organizing Impairment: Verbal complex (Backward digit span: 1/2 with self-correction)  Extremity Assessment (includes Sensation/Coordination)  Upper Extremity Assessment: LUE deficits/detail LUE Deficits / Details: No active movement. Noting tendency for flexed position. Tone into flexion at hand and elbow. Fist position and pain with extension of digits, wrist, and elbow. pain with forward flexion over 80* LUE Coordination: decreased fine motor,decreased  gross motor  Lower Extremity Assessment: Defer to PT evaluation LLE Deficits / Details: hip flexion 2-, knee extension 2+, ankle DF 0 LLE Sensation:  (unable to assess due to urgency to get to [facility])    ADLs  Overall ADL's : Needs assistance/impaired Eating/Feeding: Moderate assistance,Sitting Grooming: Moderate assistance,Sitting Upper Body Bathing: Moderate assistance,Sitting Upper Body Bathing Details (indicate cue type and reason): Pt washing his chest while sitting at [facility]. Requiring assistance for managing LUE and washing back Lower Body Bathing: Maximal assistance,Sit to/from stand Upper Body Dressing : Maximal assistance,Sitting Lower Body Dressing: Maximal assistance,Sit to/from stand Toilet Transfer: Moderate assistance,+2 for physical assistance,Squat-pivot,BSC Functional mobility during ADLs: Moderate assistance (squat pivot) General ADL Comments: Pt presenting with decreased cognition, balance, strength, and functional use of LUE/LLE.    Mobility  Overal bed mobility: Needs Assistance Bed Mobility: Supine to Sit Supine to sit: Mod assist General bed mobility comments: Cues for use of RLE to hook under LLE and bring to EOB. Mod A for bringing hips towards EOB and then elevate trunk.    Transfers  Overall transfer level: Needs assistance Equipment used: None Transfers: Sit to/from [employer] Sit to Stand: Mod assist Stand pivot transfers: Mod assist,+2 physical assistance Squat pivot transfers: Mod assist General transfer comment: Blocking of L knee. Mod A to power up; cues for reaching RUE to recliner arm. Pt then able to facilitating moving of hips to recliner    Ambulation / Gait / Stairs / Wheelchair Mobility  Ambulation/Gait General Gait Details: unable    Posture / Balance Dynamic Sitting Balance Sitting balance - Comments: Flucuating between poor and fair sitting balance. able to maintain static sitting balance and then pt noting his left lateral  lean and correcting with reaching for bedrail using RUE Balance Overall balance assessment: Needs assistance Sitting-balance support: No upper extremity supported,Feet supported Sitting balance-Leahy Scale: Fair Sitting balance - Comments: Flucuating between poor and fair sitting balance. able to maintain static sitting balance and then pt noting his left lateral lean and correcting with reaching for bedrail using RUE Postural control: Left lateral lean Standing balance support: Single extremity supported,During functional activity Standing balance-Leahy Scale: Poor Standing balance comment: see transfer    Special needs/care consideration Hgb A1c 7.3 Positive cocaine and marijuana on admit Recent move 3 months ago form [place] and does not have a PCP   Previous Home Environment  Living Arrangements: Spouse/significant other (girlfriend, Health and safety inspector)  Lives With: Significant other (girlfriend, Health and safety inspector) Available Help at Discharge: Family,Available 24 hours/day Type of Home: Apartment Home Layout: One level Home Access: Level entry Bathroom Shower/Tub: Walk-in shower,Tub/shower Personal assistant: Standard Bathroom Accessibility: Yes How Accessible: Accessible via walker Home Care Services: No Additional Comments: Girlfriend confirming information  Discharge Living Setting Plans for Discharge Living Setting: Apartment,Lives with (comment) (girlfriend, Chastity) Type of Home at Discharge: Apartment Discharge Home Layout: One level Discharge Home Access: Level entry Discharge Bathroom Shower/Tub: Tub/shower unit,Walk-in shower Discharge Bathroom Toilet: Standard Discharge Bathroom Accessibility: Yes How Accessible: Accessible via walker Does the patient have any problems obtaining your medications?: Yes (Describe) (uninsured)  Emergency planning/management officer Information: girlfriend, Chastity Anticipated Caregiver: girlfriend Anticipated Caregiver's Contact Information: see  above Ability/Limitations of  Caregiver: none Caregiver Availability: 24/7 Discharge Plan Discussed with Primary Caregiver: Yes Is Caregiver In Agreement with Plan?: Yes Does Caregiver/Family have Issues with Lodging/Transportation while Pt is in Rehab?: No  Goals Patient/Family Goal for Rehab: supervision with PT and OT Expected length of stay: ELOS 10 to 14 days Pt/Family Agrees to Admission and willing to participate: Yes Program Orientation Provided & Reviewed with Pt/Caregiver Including Roles  & Responsibilities: Yes  Decrease burden of Care through IP rehab admission: n/a  Possible need for SNF placement upon discharge:not anticipated  Patient Condition: This patient's condition remains as documented in the consult dated 04/23/2020, in which the Rehabilitation Physician determined and documented that the patient's condition is appropriate for intensive rehabilitative care in an inpatient rehabilitation facility. Will admit to inpatient rehab today.  Preadmission Screen Completed By:  Cleatrice Burke, RN, 04/23/2020 12:53 PM ______________________________________________________________________   Discussed status with Dr. Ranell Patrick on 04/23/2020 at  1258 and received approval for admission today.  Admission Coordinator:  Cleatrice Burke, time [id number] Date 04/23/2020

## 2020-04-23 NOTE — Progress Notes (Signed)
Occupational Therapy Evaluation Patient Details Name: Bobby Branch MRN: [id number] DOB: 1960/04/29 Today's Date: 04/23/2020    History of Present Illness 60 y.o. 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 s/p emergent cerebral angiogram with  successful thrombectomy with small volume SAH sylvian sulci; cocaine +   Clinical Impression   PTA, pt was living with his girlfriend and was independent with ADLs. Pt currently requiring Mod-Max A for UB ADLs, Max A for LB ADLs, and Mod A for functional transfers. Pt presenting with decreased processing, balance, strength, and functional use of LUE/LLE. Pt also reporting blurry vision and presents with slight R head turn and gaze; his glasses are at home per girlfriend. Pt high motivated to participate in therapy and girlfriend is very supportive. Pt would benefit from further acute OT to facilitate safe dc. Recommend dc to CIR for further OT to optimize safety, independence with ADLs, and return to PLOF.     Follow Up Recommendations  CIR    Equipment Recommendations  3 in 1 bedside commode;Tub/shower bench;Wheelchair cushion (measurements OT);Wheelchair (measurements OT)    Recommendations for Other Services PT consult     Precautions / Restrictions Precautions Precautions: Fall      Mobility Bed Mobility Overal bed mobility: Needs Assistance Bed Mobility: Supine to Sit     Supine to sit: Mod assist     General bed mobility comments: Cues for use of RLE to hook under LLE and bring to EOB. Mod A for bringing hips towards EOB and then elevate trunk.    Transfers Overall transfer level: Needs assistance Equipment used: None Transfers: Sit to/from [employer] Sit to Stand: Mod assist   Squat pivot transfers: Mod assist     General transfer comment:  Blocking of L knee. Mod A to power up; cues for reaching RUE to recliner arm. Pt then able to facilitating moving of hips to recliner    Balance Overall balance assessment: Needs assistance Sitting-balance support: No upper extremity supported;Feet supported Sitting balance-Leahy Scale: Fair Sitting balance - Comments: Flucuating between poor and fair sitting balance. able to maintain static sitting balance and then pt noting his left lateral lean and correcting with reaching for bedrail using RUE Postural control: Left lateral lean Standing balance support: Single extremity supported;During functional activity Standing balance-Leahy Scale: Poor                             ADL either performed or assessed with clinical judgement   ADL Overall ADL's : Needs assistance/impaired Eating/Feeding: Moderate assistance;Sitting   Grooming: Moderate assistance;Sitting   Upper Body Bathing: Moderate assistance;Sitting Upper Body Bathing Details (indicate cue type and reason): Pt washing his chest while sitting at [facility]. Requiring assistance for managing LUE and washing back Lower Body Bathing: Maximal assistance;Sit to/from stand   Upper Body Dressing : Maximal assistance;Sitting   Lower Body Dressing: Maximal assistance;Sit to/from stand   Toilet Transfer: Moderate assistance;+2 for physical assistance;Squat-pivot;BSC           Functional mobility during ADLs: Moderate assistance (squat pivot) General ADL Comments: Pt presenting with decreased cognition, balance, strength, and functional use of LUE/LLE.     Vision Baseline Vision/History: Wears glasses Wears Glasses: At all times Patient Visual Report: Blurring of vision Vision Assessment?: Yes Eye Alignment: Impaired (comment) (  dysconjugate gaze with convergence) Ocular Range of Motion: Within Functional Limits Alignment/Gaze Preference: Head turned;Gaze right (slight head turn to R) Tracking/Visual Pursuits: Able to  track stimulus in all quads without difficulty Convergence: Impaired (comment) (dysconjugate gaze with convergence) Additional Comments: Carlyle Lipa to track to all four quads. Tendency for head turn and gaze to R. also reporting blurry vision     Perception     Praxis      Pertinent Vitals/Pain Pain Assessment: No/denies pain     Hand Dominance Right   Extremity/Trunk Assessment Upper Extremity Assessment Upper Extremity Assessment: LUE deficits/detail LUE Deficits / Details: No active movement. Noting tendency for flexed position. Tone into flexion at hand and elbow. Fist position and pain with extension of digits, wrist, and elbow. pain with forward flexion over 80* LUE Coordination: decreased fine motor;decreased gross motor   Lower Extremity Assessment Lower Extremity Assessment: Defer to PT evaluation   Cervical / Trunk Assessment Cervical / Trunk Assessment: Other exceptions Cervical / Trunk Exceptions: left lean, trunk shortening   Communication Communication Communication: Expressive difficulties   Cognition Arousal/Alertness: Awake/alert Behavior During Therapy: Flat affect Overall Cognitive Status: Impaired/Different from baseline Area of Impairment: Problem solving                             Problem Solving: Slow processing General Comments: Pt following simple commands. Demonstrating slow processing. Very motivated   General Comments  Girlfriend present throughout and very supportive    Exercises     Shoulder Instructions      Home Living Family/patient expects to be discharged to:: Private residence Living Arrangements: Spouse/significant other Available Help at Discharge: Family;Available 24 hours/day Type of Home: Apartment Home Access: Level entry     Home Layout: One level     Bathroom Shower/Tub: Teacher, early years/pre: Standard     Home Equipment: None   Additional Comments: Girlfriend confirming information       Prior Functioning/Environment Level of Independence: Independent        Comments: Pt performing ADLs. No use of DME for mobility. Girlfriend assisting with IADLs and driving        OT Problem List: Decreased range of motion;Decreased strength;Decreased activity tolerance;Impaired balance (sitting and/or standing);Decreased safety awareness;Decreased knowledge of use of DME or AE;Decreased knowledge of precautions;Impaired UE functional use      OT Treatment/Interventions: Self-care/ADL training;Therapeutic exercise;Energy conservation;DME and/or AE instruction;Therapeutic activities;Patient/family education    OT Goals(Current goals can be found in the care plan section) Acute Rehab OT Goals Patient Stated Goal: Go home OT Goal Formulation: With patient Time For Goal Achievement: 05/07/20 Potential to Achieve Goals: Good  OT Frequency: Min 2X/week   Barriers to D/C:            Co-evaluation              AM-PAC OT "6 Clicks" Daily Activity     Outcome Measure Help from another person eating meals?: A Lot Help from another person taking care of personal grooming?: A Lot Help from another person toileting, which includes using toliet, bedpan, or urinal?: A Lot Help from another person bathing (including washing, rinsing, drying)?: A Lot Help from another person to put on and taking off regular upper body clothing?: A Lot Help from another person to put on and taking off regular lower body clothing?: A Lot 6 Click Score: 12   End of Session Equipment Utilized During Treatment: Gait belt  Nurse Communication: Mobility status  Activity Tolerance: Patient tolerated treatment well Patient left: with call bell/phone within reach;with nursing/sitter in room;with family/visitor present (BSC with NT for bath)  OT Visit Diagnosis: Unsteadiness on feet (R26.81);Other abnormalities of gait and mobility (R26.89);Muscle weakness (generalized) (M62.81);Hemiplegia and  hemiparesis Hemiplegia - Right/Left: Left Hemiplegia - dominant/non-dominant: Non-Dominant Hemiplegia - caused by: Cerebral infarction                Time: [id number] OT Time Calculation (min): 31 min Charges:  OT General Charges $OT Visit: 1 Visit OT Evaluation $OT Eval Moderate Complexity: 1 Mod OT Treatments $Self Care/Home Management : 8-22 mins  Filippa Yarbough MSOT, OTR/L Acute Rehab Pager: [phone] Office: [place] 04/23/2020, 12:38 PM

## 2020-04-24 ENCOUNTER — Encounter (HOSPITAL_COMMUNITY): Payer: Self-pay | Admitting: Physical Medicine & Rehabilitation

## 2020-04-24 LAB — LUPUS ANTICOAGULANT PANEL
DRVVT: 44.2 s (ref 0.0–47.0)
PTT Lupus Anticoagulant: 46.4 s (ref 0.0–51.9)

## 2020-04-24 LAB — COMPREHENSIVE METABOLIC PANEL
ALT: 15 U/L (ref 0–44)
AST: 26 U/L (ref 15–41)
Albumin: 2.1 g/dL — ABNORMAL LOW (ref 3.5–5.0)
Alkaline Phosphatase: 60 U/L (ref 38–126)
Anion gap: 6 (ref 5–15)
BUN: 11 mg/dL (ref 6–20)
CO2: 24 mmol/L (ref 22–32)
Calcium: 8.1 mg/dL — ABNORMAL LOW (ref 8.9–10.3)
Chloride: 108 mmol/L (ref 98–111)
Creatinine, Ser: 1.95 mg/dL — ABNORMAL HIGH (ref 0.61–1.24)
GFR, Estimated: 39 mL/min — ABNORMAL LOW (ref >60)
Glucose, Bld: 88 mg/dL (ref 70–99)
Potassium: 3.5 mmol/L (ref 3.5–5.1)
Sodium: 138 mmol/L (ref 135–145)
Total Bilirubin: 0.5 mg/dL (ref 0.3–1.2)
Total Protein: 5.4 g/dL — ABNORMAL LOW (ref 6.5–8.1)

## 2020-04-24 LAB — CBC WITH DIFFERENTIAL/PLATELET
Abs Immature Granulocytes: 0.03 10*3/uL (ref 0.00–0.07)
Basophils Absolute: 0 10*3/uL (ref 0.0–0.1)
Basophils Relative: 0 %
Eosinophils Absolute: 0.3 10*3/uL (ref 0.0–0.5)
Eosinophils Relative: 4 %
HCT: 37.3 % — ABNORMAL LOW (ref 39.0–52.0)
Hemoglobin: 12.7 g/dL — ABNORMAL LOW (ref 13.0–17.0)
Immature Granulocytes: 0 %
Lymphocytes Relative: 29 %
Lymphs Abs: 2.1 10*3/uL (ref 0.7–4.0)
MCH: 31.4 pg (ref 26.0–34.0)
MCHC: 34 g/dL (ref 30.0–36.0)
MCV: 92.1 fL (ref 80.0–100.0)
Monocytes Absolute: 0.6 10*3/uL (ref 0.1–1.0)
Monocytes Relative: 8 %
Neutro Abs: 4.1 10*3/uL (ref 1.7–7.7)
Neutrophils Relative %: 59 %
Platelets: 306 10*3/uL (ref 150–400)
RBC: 4.05 MIL/uL — ABNORMAL LOW (ref 4.22–5.81)
RDW: 13.5 % (ref 11.5–15.5)
WBC: 7.1 10*3/uL (ref 4.0–10.5)
nRBC: 0 % (ref 0.0–0.2)

## 2020-04-24 LAB — BETA-2-GLYCOPROTEIN I ABS, IGG/M/A
Beta-2 Glyco I IgG: <9 GPI IgG units (ref 0–20)
Beta-2-Glycoprotein I IgA: <9 GPI IgA units (ref 0–25)
Beta-2-Glycoprotein I IgM: <9 GPI IgM units (ref 0–32)

## 2020-04-24 LAB — GLUCOSE, CAPILLARY
Glucose-Capillary: 108 mg/dL — ABNORMAL HIGH (ref 70–99)
Glucose-Capillary: 141 mg/dL — ABNORMAL HIGH (ref 70–99)
Glucose-Capillary: 283 mg/dL — ABNORMAL HIGH (ref 70–99)
Glucose-Capillary: 82 mg/dL (ref 70–99)
Glucose-Capillary: 84 mg/dL (ref 70–99)
Glucose-Capillary: 96 mg/dL (ref 70–99)

## 2020-04-24 LAB — CARDIOLIPIN ANTIBODIES, IGG, IGM, IGA
Anticardiolipin IgA: <9 APL U/mL (ref 0–11)
Anticardiolipin IgG: <9 GPL U/mL (ref 0–14)
Anticardiolipin IgM: <9 MPL U/mL (ref 0–12)

## 2020-04-24 LAB — ANTINUCLEAR ANTIBODIES, IFA: ANA Ab, IFA: NEGATIVE

## 2020-04-24 MED ORDER — ADULT MULTIVITAMIN W/MINERALS CH
1.0000 | ORAL_TABLET | Freq: Every day | ORAL | Status: DC
  Administered 2020-04-24 – 2020-05-22 (×29): 1 via ORAL
  Filled 2020-04-24 (×29): qty 1

## 2020-04-24 MED ORDER — NICOTINE 7 MG/24HR TD PT24
7.0000 mg | MEDICATED_PATCH | Freq: Every day | TRANSDERMAL | Status: DC
  Administered 2020-04-24 – 2020-05-22 (×29): 7 mg via TRANSDERMAL
  Filled 2020-04-24 (×29): qty 1

## 2020-04-24 MED ORDER — GLUCERNA SHAKE PO LIQD
237.0000 mL | Freq: Three times a day (TID) | ORAL | Status: DC
  Administered 2020-04-24 – 2020-05-21 (×65): 237 mL via ORAL
  Filled 2020-04-24 (×5): qty 237

## 2020-04-24 MED ORDER — ALBUTEROL SULFATE (2.5 MG/3ML) 0.083% IN NEBU: 2.5000 mg | INHALATION_SOLUTION | Freq: Four times a day (QID) | RESPIRATORY_TRACT | Status: DC | PRN

## 2020-04-24 NOTE — Progress Notes (Signed)
Inpatient Rehabilitation Care Coordinator Assessment and Plan Patient Details  Name: Eddrick Dilone MRN: [id number] Date of Birth: Dec 31, 1960  Today's Date: 04/24/2020  Hospital Problems: Principal Problem:   Right middle cerebral artery stroke [facility])  Past Medical History:  Past Medical History:  Diagnosis Date  . Stroke [facility]) 09/2019   Past Surgical History:  Past Surgical History:  Procedure Laterality Date  . IR ANGIO EXTRACRAN SEL COM CAROTID INNOMINATE UNI L MOD SED  04/20/2020  . IR CT HEAD LTD  04/20/2020  . IR PERCUTANEOUS ART THROMBECTOMY/INFUSION INTRACRANIAL INC DIAG ANGIO  04/20/2020  . IR US GUIDE VASC ACCESS RIGHT  04/20/2020  . RADIOLOGY WITH ANESTHESIA N/A 04/20/2020   Procedure: IR WITH ANESTHESIA;  Surgeon: Luanne Bras, MD;  Location: [place];  Service: Radiology;  Laterality: N/A;   Social History:  has no history on file for tobacco use, alcohol use, and drug use.  Family / Support Systems Marital Status: Single Spouse/Significant Other: Chasity Children: Copywriter, advertising" Anticipated Caregiver: Health and safety inspector (girlfriend) Ability/Limitations of Caregiver: none Caregiver Availability: 24/7  Social History Preferred language: English Religion:  Read: Yes Write: Yes Employment Status: Disabled Guardian/Conservator: n/a   Abuse/Neglect Abuse/Neglect Assessment Can Be Completed: Yes Physical Abuse: Denies Verbal Abuse: Denies Sexual Abuse: Denies Exploitation of patient/patient's resources: Denies Self-Neglect: Denies  Emotional Status Pt's affect, behavior and adjustment status: yes Recent Psychosocial Issues: n/a Psychiatric History: n/a Substance Abuse History: n/a  Patient / Family Perceptions, Expectations & Goals Pt/Family understanding of illness & functional limitations: yes Premorbid pt/family roles/activities: Previously Indpendent. Some assistance with self care and cognition Anticipated changes in roles/activities/participation: Some  assistance/supervision Pt/family expectations/goals: Supervision  [employer]: None Premorbid Home Care/DME Agencies: None Transportation available at discharge: Family able to transport Resource referrals recommended: Neuropsychology (Coping- CVA)  Discharge Planning Living Arrangements: Spouse/significant other Support Systems: Spouse/significant other,Children Type of Residence: Private residence (1 level, level entry) Administrator, sports: Teacher, adult education Resources: Family Support Financial Screen Referred: Yes (Referred for Disability and Insurance underwriter) Living Expenses: Other (Comment) (Lives with girlfriend) Money Management: Patient Does the patient have any problems obtaining your medications?: No (Will set patient with [place] (if qualifying)) Home Management: Independent Patient/Family Preliminary Plans: Some assistance Care Coordinator Anticipated Follow Up Needs: [place] Additional Notes/Comments: NO PCP (SW Will arrange), Patient Uninsured Expected length of stay: 10-14 Days  Clinical Impression Sw met with patient introduced self, explained rome and addressed questions and concerns. Pt asleep, Chasity at bedside. SW will continue to follow up with questions and concerns   Dyanne Iha 04/24/2020, 12:51 PM

## 2020-04-24 NOTE — Progress Notes (Signed)
Physical Therapy Session Note  Patient Details  Name: Bobby Branch MRN: [id number] Date of Birth: 03-24-1960  Today's Date: 04/24/2020 PT Individual Time: 1356-1510 PT Individual Time Calculation (min): 74 min    Short Term Goals: Week 1:  PT Short Term Goal 1 (Week 1): Pt will perform bed mobility mod assist. PT Short Term Goal 2 (Week 1): Pt will perform bed to chair transfer mod assist. PT Short Term Goal 3 (Week 1): Pt will initiate gait training. PT Short Term Goal 4 (Week 1): Pt will perform sit to stand mod assist.  Skilled Therapeutic Interventions/Progress Updates:  Pt received supine in bed with HOB elevated and fiance in room. Pt agreeable to therapy. RN entered room and gave pt Tylenol to help allieviate LUE pain. Pt lowered to supine and re-educated on how to roll to L sidelying via verbal cuing to bend R knee and push towards L side, and grab/pull L bed rail with RUE. Pt LUE protected due to flaccidity, and min assist provided. Side lying to sit max assist with verbal cuing to push up with RUE. Pt appeared to recall most of of these instructions from morning eval session. Pt able to maintain independent sitting balance, but requires min-mod assist when donning shirt (likely due to dual-task cognitive challenge). LLE tendency to ER in sitting. LLE physically kept in neutral and foot re-placed to increase knee flexion to promote WB, and pt transferred to [facility] towards R max assist with verbal cuing to lean towards L side into therapist and push with RUE and RLE to assist. Pt demonstrates occasional inattention to R UE position (sat on R hand during transfer). Pt wheeled to therapy gym and transferred to mat towards L side with verbal cuing towards R side into therapist and push away from [facility] with RUE. LLE hip kept in neutral. Sit to stand max assist with verbal cuing to extend L knee and tuck hips into posterior pelvic tilt. No quad contraction felt during standing with minimal glute  contraction. Pre-gait NMR via L leg stance and R leg forward/backward stepping. Pt cued to shift weight of hips towards L side; hesitant to step with R leg due to fear. Able to step ~5 steps forward/backward (minimal linear displacement). Performed 1 more time after sitting rest break. Therapist knees used to extend knee from pt's side to prevent knee buckling and therapist hand used to cue posterior pelvic tilt. No quad contraction felt during standing with minimal glute contraction. PASS performed to assess mobility achieving score of 10/36, demonstrating significant mobility impairments. Pt transferred back to [facility] max assist squat pivot towards R side with same cues used previously. Pt wheeled to room and left with in [facility] with fiance present, seat belt alarm turned on, and needs within reach.  Postural Assessment Scale for Stroke Patients (PASS)  Give the subject instructions for each item as written below. When scoring the item, record the lowest response category that applies for each item.  Perform in this order to improve efficiency of test     2  6. Supine to Paretic Side Lateral Instructions: Begin with the subject in supine on a treatment mat. Instruct the subject to roll to the paretic side (lateral movement). Assist as necessary. Evaluate the subject's performance on the amount of help required. Do not consider the quality of performance. (3) Can perform without help (2) Can perform with little help (1) Can perform with much help (0) Cannot perform  1  7. Supine to Nonparetic Side  Lateral Instructions: Begin with the subject in supine on a treatment mat. Instruct the subject to roll to the nonparetic side (lateral movement). Assist as necessary. Evaluate the subject's performance on the amount of help required. Do not consider the quality of performance. (3) Can perform without help (2) Can perform with little help (1) Can perform with much help (0) Cannot perform  1  8. Supine to  Sitting Up on the Edge of the Mat Instructions: Begin with the subject in supine on a treatment mat. Instruct the subject to come to sitting on the edge of the mat. Assist as necessary. Evaluate the subject's performance on the amount of help required. Do not consider the quality of performance. (3) Can perform without help (2) Can perform with little help (1) Can perform with much help (0) Cannot perform  2  1. Sitting Without Support Instructions: Have the subject sit on a bench/mat without back support and with feet flat on the floor. (3) Can sit for 5 minutes without support (2) Can sit for more than 10 seconds without support (1) Can sit with slight support (for example, by 1 hand) (0) Cannot sit  1  10. Sitting to Standing Up Instructions: Begin with the subject sitting on the edge of a treatment mat. Instruct the subject to stand up without support. Assist if necessary. Evaluate the subject's performance on the amount of help required. Do not consider the quality of performance. (3) Can perform without help (2) Can perform with little help (1) Can perform with much help (0) Cannot perform  1  2. Standing With Support Instructions: Have the subject stand, providing support as needed. Evaluate only the ability to stand with or without support. Do not consider the quality of the stance. (3) Can stand with support of only 1 hand (2) Can stand with moderate support of 1 person (1) Can stand with strong support of 2 people (0) Cannot stand, even with support  0  3. Standing Without Support Instructions: Have the subject stand without support. Evaluate only the ability to stand with or without support. Do not consider the quality of the stance. (3) Can stand without support for more than 1 minute and simultaneously perform arm movements at about shoulder level (2) Can stand without support for 1 minute or stands slightly asymmetrically  (1) Can stand without support for 10 seconds or  leans heavily on 1 leg (0) Cannot stand without support  0  12. Standing, Picking Up a Pencil from the Floor Instructions: Begin with the subject standing. Instruct the subject to pick up a pencil fro the floor without support. Assist if necessary. Evaluate the subject's performance on the amount of help required. Do not consider the quality of performance. (3) Can perform without help (2) Can perform with little help (1) Can perform with much help (0) Cannot perform  0  4. Standing on Nonparetic Leg Instructions: Have the subject stand on the nonparetic leg. Evaluate only the ability to bear weight entirely on the nonparetic leg. Do not consider how the subject accomplishes the task. (3) Can stand on nonparetic leg for more than 10 seconds (2) Can stand on nonparetic leg for more than 5 seconds (1) Can stand on nonparetic leg for a few seconds (0) Cannot stand on nonparetic leg  0  5. Standing on Paretic Leg Instructions: Have the subject stand on the paretic leg. Evaluate only the ability to bear weight entirely on the paretic leg. Do not consider how the  subject accomplishes the task. (3) Can stand on paretic leg for more than 10 seconds (2) Can stand on paretic leg for more than 5 seconds (1) Can stand on paretic leg for a few seconds (0) Cannot stand on paretic leg   1  11. Standing Up to Sitting Down Instructions: Begin with the subject standing by edge of a treatment mat. Instruct the subject to sit on edge of mat without support. Assist if necessary. Evaluate the subject's performance on the amount of help required. Do not consider the quality of performance. (3) Can perform without help (2) Can perform with little help (1) Can perform with much help (0) Cannot perform  1  9. Sitting on the Edge of the Mat to Supine Instructions: Begin with the on the edge of a treatment mat. Instruct the subject to return to supine. Assist as necessary. Evaluate the subject's performance on  the amount of help required. Do not consider the quality of performance. (3) Can perform without help (2) Can perform with little help (1) Can perform with much help (0) Cannot perform   Maintaining Posture SUBTOTAL      3              .      ( items 1, 2, 3, 4, 5)  Changing Posture SUBTOTAL      7  .    (Items 6, 7, 8, 9, 10, 11, 12)      TOTAL    10/36      Therapy Documentation Precautions:  Precautions Precautions: Fall Restrictions Weight Bearing Restrictions: No    Therapy/Group: Individual Therapy  Eleonore Chiquito, SPT 04/24/2020, 3:17 PM

## 2020-04-24 NOTE — Progress Notes (Signed)
Orthopedic Tech Progress Note Patient Details:  Bobby Branch 1960/11/09 [id number] Called in order to [facility] for a REHAB COMBO Patient ID: Bobby Branch, male   DOB: 28-Apr-1960, 60 y.o.   MRN: [id number]   [name] 04/24/2020, 9:54 AM

## 2020-04-24 NOTE — Progress Notes (Addendum)
PROGRESS NOTE   Subjective/Complaints: No issues overnight, feels like he gets short winded at imes, used albuterol in past, discussed smoking cessation, pt req nicoderm Also discussed stroke risk with cocaine, pt was drug free for a year with NA in past would like to resume when able   ROS- neg CP, SOB, N/V/D   Objective:   No results found. Recent Labs    04/23/20 0345 04/24/20 0504  WBC 9.2 7.1  HGB 12.4* 12.7*  HCT 37.0* 37.3*  PLT 311 306   Recent Labs    04/23/20 0345 04/24/20 0504  NA 138 138  K 3.4* 3.5  CL 108 108  CO2 24 24  GLUCOSE 91 88  BUN 13 11  CREATININE 2.11* 1.95*  CALCIUM 8.4* 8.1*    Intake/Output Summary (Last 24 hours) at 04/24/2020 0854 Last data filed at 04/24/2020 0500 Gross per 24 hour  Intake 240 ml  Output 650 ml  Net -410 ml        Physical Exam: Vital Signs Blood pressure (!) 174/91, pulse 68, temperature 98.1 F (36.7 C), resp. rate 17, height '5\' 6"'$  (1.676 m), weight 49.2 kg, SpO2 100 %.   General: No acute distress Mood and affect are appropriate Heart: Regular rate and rhythm no rubs murmurs or extra sounds Lungs: Clear to auscultation, breathing unlabored, no rales or wheezes Abdomen: Positive bowel sounds, soft nontender to palpation, nondistended Extremities: No clubbing, cyanosis, or edema Skin: No evidence of breakdown, no evidence of rash Neurologic: Cranial nerves II through XII intact, motor strength is 5/5 in RIght and 0/5 left  deltoid, bicep, tricep, grip, hip flexor, knee extensors, ankle dorsiflexor and plantar flexor Sensory exam reduced  sensation to light touch in LUE Increase in left Finger flexor tone MAS 2 Musculoskeletal: Full range of motion in all 4 extremities. No joint swelling   Assessment/Plan: 1. Functional deficits which require 3+ hours per day of interdisciplinary therapy in a comprehensive inpatient rehab setting.  Physiatrist is  providing close team supervision and 24 hour management of active medical problems listed below.  Physiatrist and rehab team continue to assess barriers to discharge/monitor patient progress toward functional and medical goals  Care Tool:  Bathing              Bathing assist       Upper Body Dressing/Undressing Upper body dressing        Upper body assist      Lower Body Dressing/Undressing Lower body dressing            Lower body assist       Toileting Toileting    Toileting assist       Transfers Chair/bed transfer  Transfers assist           Locomotion Ambulation   Ambulation assist              Walk 10 feet activity   Assist           Walk 50 feet activity   Assist           Walk 150 feet activity   Assist  Walk 10 feet on uneven surface  activity   Assist           Wheelchair     Assist               Wheelchair 50 feet with 2 turns activity    Assist            Wheelchair 150 feet activity     Assist          Blood pressure (!) 174/91, pulse 68, temperature 98.1 F (36.7 C), resp. rate 17, height '5\' 6"'$  (1.676 m), weight 49.2 kg, SpO2 100 %. Medical Problem List and Plan: 1.  Left-sided weakness secondary to right MCA distribution infarction with right M2/3 occlusion status post thrombectomy revascularization as well as history of CVA August 2021 with very mild residual deficits             -patient may shower             -ELOS/Goals: minA in 10-14 days Order [facility]  2.  Impaired mobility: -DVT/anticoagulation: SCDs             -antiplatelet therapy: Continue Aspirin 325 mg daily and Plavix 75 mg daily x3 months 3. Pain Management: Tylenol as needed 4. Depression: Continue Lexapro 10 mg daily             -antipsychotic agents: N/A 5. Neuropsych: This patient is capable of making decisions on his own behalf. 6. Skin/Wound Care: Routine skin checks 7.  Fluids/Electrolytes/Nutrition: Routine in and outs with follow-up chemistries 8.  Hypertension.  BP has been labile- continue Norvasc 10 mg daily.  Monitor with increased mobility 9.  Diabetes mellitus.  Hemoglobin A1c 7.3.  SSI.  Patient on Glucophage 500 mg twice daily prior to admission.  Resume as needed CBG (last 3)  Recent Labs    04/24/20 0005 04/24/20 0420 04/24/20 0758  GLUCAP 84 82 108*   Controlled  10.  Hyperlipidemia.  Lipitor 11.  Polysubstance abuse.  Urine drug screen positive cocaine as well as marijuana.  ask neuropsych to provide  Counseling, pt plans to attend NA as OP  Tobacco abuse add nicoderm patch 73mg 12.  CKD stage III.  BMET stable     LOS: 1 days A FACE TO FACE EVALUATION WAS PERFORMED  ACharlett Blake3/22/2022, 8:54 AM

## 2020-04-24 NOTE — Evaluation (Signed)
Occupational Therapy Assessment and Plan  Patient Details  Name: Bobby Branch MRN: [id number] Date of Birth: 1960/06/29  OT Diagnosis: abnormal posture, cognitive deficits, disturbance of vision, flaccid hemiplegia and hemiparesis and muscle weakness (generalized) Rehab Potential: Rehab Potential (ACUTE ONLY): Good ELOS: 23-26 days   Today's Date: 04/24/2020 OT Individual Time: 0277-4128       Hospital Problem: Principal Problem:   Right middle cerebral artery stroke [facility])   Past Medical History:  Past Medical History:  Diagnosis Date  . Stroke [facility]) 09/2019   Past Surgical History:  Past Surgical History:  Procedure Laterality Date  . IR ANGIO EXTRACRAN SEL COM CAROTID INNOMINATE UNI L MOD SED  04/20/2020  . IR CT HEAD LTD  04/20/2020  . IR PERCUTANEOUS ART THROMBECTOMY/INFUSION INTRACRANIAL INC DIAG ANGIO  04/20/2020  . IR US GUIDE VASC ACCESS RIGHT  04/20/2020  . RADIOLOGY WITH ANESTHESIA N/A 04/20/2020   Procedure: IR WITH ANESTHESIA;  Surgeon: Luanne Bras, MD;  Location: [place];  Service: Radiology;  Laterality: N/A;    Assessment & Plan Clinical Impression: Patient is a 60 y.o. year old male with recent admission to the hospital on 04/20/2020 with acute onset of left-sided weakness.  Cranial CT scan showed asymmetric hyperdensity involving the right M2/M3 branches concerning for thrombus.  Few remote lacunar infarcts about the bilateral basal ganglia.  CT angiogram of head and neck as well as CT cerebral perfusion scan showed acute 15 cc core infarct involving the posterior right MCA distribution with a 67 cc surrounding ischemic penumbra.  Patient underwent TICI 3 right MCA revascularization per interventional radiology.  MRI follow-up showed right MCA patchy small acute infarct with possible petechial hemorrhage. ansferred to CIR on 04/23/2020 .    Patient currently requires max with basic self-care skills secondary to muscle weakness and muscle paralysis, impaired timing  and sequencing, unbalanced muscle activation, decreased coordination and decreased motor planning, field cut, decreased attention to left and left side neglect, decreased initiation, decreased attention, decreased awareness, decreased problem solving, decreased safety awareness and decreased memory and decreased sitting balance, decreased standing balance, decreased postural control, hemiplegia and decreased balance strategies.  Prior to hospitalization, patient could complete ADLs with modified independent .  Patient will benefit from skilled intervention to decrease level of assist with basic self-care skills and increase independence with basic self-care skills prior to discharge home with care partner.  Anticipate patient will require minimal physical assistance and follow up outpatient.  OT - End of Session Activity Tolerance: Decreased this session Endurance Deficit: Yes OT Assessment Rehab Potential (ACUTE ONLY): Good OT Patient demonstrates impairments in the following area(s): Balance;Cognition;Endurance;Motor;Perception;Safety;Sensory;Vision OT Basic ADL's Functional Problem(s): Eating;Grooming;Bathing;Dressing;Toileting OT Transfers Functional Problem(s): Toilet;Tub/Shower OT Additional Impairment(s): Fuctional Use of Upper Extremity OT Plan OT Intensity: Minimum of 1-2 x/day, 45 to 90 minutes OT Frequency: 5 out of 7 days OT Duration/Estimated Length of Stay: 23-26 days OT Treatment/Interventions: Balance/vestibular training;Discharge planning;Functional electrical stimulation;Pain management;Self Care/advanced ADL retraining;Therapeutic Activities;UE/LE Coordination activities;Cognitive remediation/compensation;Disease mangement/prevention;Functional mobility training;Patient/family education;Therapeutic Exercise;Visual/perceptual remediation/compensation;DME/adaptive equipment instruction;Neuromuscular re-education;Psychosocial support;Splinting/orthotics;UE/LE Strength  taining/ROM;Wheelchair propulsion/positioning OT Self Feeding Anticipated Outcome(s): modified independent OT Basic Self-Care Anticipated Outcome(s): supervision to min assist OT Toileting Anticipated Outcome(s): min assist OT Bathroom Transfers Anticipated Outcome(s): min assist OT Recommendation Recommendations for Other Services: Speech consult Patient destination: Home Follow Up Recommendations: 24 hour supervision/assistance;Home health OT Equipment Recommended: To be determined   OT Evaluation Precautions/Restrictions  Precautions Precautions: Fall Precaution Comments: left hemiparesis Restrictions Weight Bearing Restrictions: No  Pain  Slight shoulder pain reported in the left shoulder, no request for medication  Home Living/Prior Functioning Home Living Family/patient expects to be discharged to:: Inpatient rehab Living Arrangements: Spouse/significant other Available Help at Discharge: Family,Available 24 hours/day (lives with girlfriend, reports nephew can help as well) Type of Home: Apartment Home Access: Level entry Home Layout: One level Bathroom Shower/Tub: Government social research officer Accessibility: Yes Additional Comments: Fiance confirming information  Lives With: Significant other IADL History Homemaking Responsibilities: Yes Laundry Responsibility: Secondary Current License: Yes Occupation: On disability (disability pending) Prior Function Level of Independence: Independent with basic ADLs,Independent with homemaking with wheelchair,Independent with gait,Independent with transfers Comments: Pt performing ADLs. No use of DME for mobility. Fiance assisted with driving Vision Baseline Vision/History: Wears glasses Wears Glasses: Reading only (per pt report) Patient Visual Report: Blurring of vision (reports blurring of vision since last CVA but no new changes from this one) Vision Assessment?: Vision impaired- to be further tested  in functional context Alignment/Gaze Preference: Head turned;Gaze right Perception  Perception: Impaired Inattention/Neglect: Does not attend to left side of body Praxis Praxis: Impaired Praxis Impairment Details: Initiation Cognition Overall Cognitive Status: Impaired/Different from baseline Arousal/Alertness: Awake/alert Orientation Level: Person;Place;Situation Person: Oriented Place: Oriented Situation: Oriented Year: 2022 Month: March Day of Week: Correct Memory: Appears intact Immediate Memory Recall: Sock;Blue;Bed Memory Recall Sock: Without Cue Memory Recall Blue: Without Cue Memory Recall Bed: Without Cue Attention: Focused;Sustained Focused Attention: Appears intact Sustained Attention: Impaired Sustained Attention Impairment: Functional basic Awareness: Impaired Awareness Impairment: Emergent impairment;Anticipatory impairment Problem Solving: Impaired Safety/Judgment: Impaired Sensation Sensation Light Touch: Impaired Detail Light Touch Impaired Details: Impaired LUE Hot/Cold: Not tested Proprioception: Impaired Detail Proprioception Impaired Details: Impaired LUE Stereognosis: Not tested Coordination Gross Motor Movements are Fluid and Coordinated: No Fine Motor Movements are Fluid and Coordinated: No Coordination and Movement Description: Pt currently with Brunnstrum stage I-II movement in the left arm and hand.  Slight increased flexor tone noted in the finger flexors. He currently needs hand over hand assist to integrate into bathing tasks as a stabilizer. Motor  Motor Motor: Hemiplegia;Abnormal tone Motor - Skilled Clinical Observations: LUE and LLE hemiparesis  Trunk/Postural Assessment  Cervical Assessment Cervical Assessment: Exceptions to [facility] (cervical flexion with right rotation at rest) Thoracic Assessment Thoracic Assessment: Exceptions to [facility] (thoracic rounding) Lumbar Assessment Lumbar Assessment: Exceptions to WFL (moderate posterior  pelvic tilt in sitting) Postural Control Postural Control: Deficits on evaluation Righting Reactions: Increased LOB to the left with functional reaching during LB bathing tasks from the recliner.  Balance Balance Balance Assessed: Yes Static Sitting Balance Static Sitting - Balance Support: Feet supported Static Sitting - Level of Assistance: 4: Min assist (with initial sitting EOB) Dynamic Sitting Balance Dynamic Sitting - Balance Support: Feet unsupported Dynamic Sitting - Level of Assistance: 3: Mod assist Static Standing Balance Static Standing - Balance Support: During functional activity Static Standing - Level of Assistance: 2: Max assist Dynamic Standing Balance Dynamic Standing - Balance Support: During functional activity Dynamic Standing - Level of Assistance: 2: Max assist Extremity/Trunk Assessment RUE Assessment RUE Assessment: Within Functional Limits General Strength Comments: Strength 4/5 throughout LUE Assessment LUE Assessment: Exceptions to [facility] Passive Range of Motion (PROM) Comments: Slight increased tone noted in the finger flexors General Strength Comments: Brunnstrum stage I-II in the arm and hand.  Flexor tone developing with trace shoulder movement noted.  Max hand over hand assist is needed for integration into selfcare tasks at this time.  Care Tool Care Tool Self Care  Eating   Eating Assist Level: Set up assist    Oral Care    Oral Care Assist Level: Supervision/Verbal cueing    Bathing   Body parts bathed by patient: Left arm;Chest;Abdomen;Right upper leg;Left upper leg;Face Body parts bathed by helper: Front perineal area;Buttocks;Right arm;Right lower leg;Left lower leg   Assist Level: Maximal Assistance - Patient 24 - 49%    Upper Body Dressing(including orthotics)   What is the patient wearing?: Pull over shirt   Assist Level: Maximal Assistance - Patient 25 - 49%    Lower Body Dressing (excluding footwear)   What is the patient  wearing?: Underwear/pull up;Incontinence brief Assist for lower body dressing: Maximal Assistance - Patient 25 - 49%    Putting on/Taking off footwear   What is the patient wearing?: Non-skid slipper socks Assist for footwear: Dependent - Patient 0%       Care Tool Toileting Toileting activity   Assist for toileting: Maximal Assistance - Patient 25 - 49%     Care Tool Bed Mobility Roll left and right activity   Roll left and right assist level: Minimal Assistance - Patient > 75% (Min assist to L side, Max assist to R side)    Sit to lying activity   Sit to lying assist level: Maximal Assistance - Patient 25 - 49%    Lying to sitting edge of bed activity   Lying to sitting edge of bed assist level: Maximal Assistance - Patient 25 - 49%     Care Tool Transfers Sit to stand transfer   Sit to stand assist level: Maximal Assistance - Patient 25 - 49%    Chair/bed transfer   Chair/bed transfer assist level: Maximal Assistance - Patient 25 - 49%     Toilet transfer Toilet transfer activity did not occur: Safety/medical concerns       Care Tool Cognition Expression of Ideas and Wants Expression of Ideas and Wants: Some difficulty - exhibits some difficulty with expressing needs and ideas (e.g, some words or finishing thoughts) or speech is not clear   Understanding Verbal and Non-Verbal Content Understanding Verbal and Non-Verbal Content: Usually understands - understands most conversations, but misses some part/intent of message. Requires cues at times to understand   Memory/Recall Ability *first 3 days only Memory/Recall Ability *first 3 days only: That he or she is in a hospital/hospital unit;Current season    Refer to Care Plan for Long Term Goals  SHORT TERM GOAL WEEK 1 OT Short Term Goal 1 (Week 1): Pt will complete UB bathing sitting unsupported with min assist. OT Short Term Goal 2 (Week 1): Pt will donn a pullover shirt for two consecutive sessions with min assist. OT  Short Term Goal 3 (Week 1): Pt will complete toilet transfer and toileting tasks with no more than mod assist. OT Short Term Goal 4 (Week 1): Pt will integrate the LUE into bathing tasks as a stabilizer with mod assist and mod instructional cueing. OT Short Term Goal 5 (Week 1): Pt will maintain sustained attention to selfcare tasks for 15 mins with no more than min instructional cueing for re-direction.  Recommendations for other services: Therapeutic Recreation  Stress management   Skilled Therapeutic Intervention ADL ADL Eating: Set up Where Assessed-Eating: Bed level Grooming: Supervision/safety Where Assessed-Grooming: Chair Upper Body Bathing: Moderate assistance Where Assessed-Upper Body Bathing: Chair Lower Body Bathing: Maximal assistance Where Assessed-Lower Body Bathing: Chair Upper Body Dressing: Maximal assistance Where Assessed-Upper Body Dressing: Chair Lower Body Dressing: Maximal assistance Where  Assessed-Lower Body Dressing: Chair Toileting: Maximal assistance Where Assessed-Toileting: Bedside Commode Toilet Transfer: Maximal assistance Toilet Transfer Method: Stand pivot Toilet Transfer Equipment: Radiographer, therapeutic: Not assessed Social research officer, government: Not assessed Mobility  Bed Mobility Bed Mobility: Rolling Left;Left Sidelying to Sit Rolling Left: Minimal Assistance - Patient > 75% Left Sidelying to Sit: Maximal Assistance - Patient 25-49% Transfers Sit to Stand: Maximal Assistance - Patient 25-49% Stand to Sit: Maximal Assistance - Patient 25-49%  Session Note:  Pt in bed to start session working on breakfast.  He reports dissatisfaction with breakfast order and this was passed on to the dinning services by therapist.  He attempted transfer to the left EOB to start session, requiring max assist to achieve.  Min assist for sitting balance with max assist needed to transfer stand pivot to the recliner secondary to his bed being soiled with  urine.  He was able to then work on bathing tasks.  Max instructional cueing for sequencing, initiation, and sustained attention to bathing task.  He would repeatedly engage in conversation, but then be unable to continue washing while talking.  Max assist for sit to stand with max hand over hand for use of the LUE to wash the right arm. Max assist for sit to stand with max assist for all dressing.  Pt maintains cervical flexion and head rotation to the right during session, however he would turn his head to the left to grasp washcloth from the therapist.  Finished session with pt remaining in the recliner with safety belt in place and call button and phone in reach.     Discharge Criteria: Patient will be discharged from OT if patient refuses treatment 3 consecutive times without medical reason, if treatment goals not met, if there is a change in medical status, if patient makes no progress towards goals or if patient is discharged from hospital.  The above assessment, treatment plan, treatment alternatives and goals were discussed and mutually agreed upon: by patient  Rimsha Trembley OTR/L 04/24/2020, 9:26 PM

## 2020-04-24 NOTE — Progress Notes (Signed)
Patient ID: Bobby Branch, male   DOB: 1960/09/20, 60 y.o.   MRN: [id number]   Message sent to [employer] to follow up on patient Medicaid and Disability.  [place], [place]

## 2020-04-24 NOTE — Progress Notes (Signed)
Inpatient [place] Individual Statement of Services  Patient Name:  Bobby Branch  Date:  04/24/2020  Welcome to the [facility].  Our goal is to provide you with an individualized program based on your diagnosis and situation, designed to meet your specific needs.  With this comprehensive rehabilitation program, you will be expected to participate in at least 3 hours of rehabilitation therapies Monday-Friday, with modified therapy programming on the weekends.  Your rehabilitation program will include the following services:  Physical Therapy (PT), Occupational Therapy (OT), Speech Therapy (ST), 24 hour per day rehabilitation nursing, Therapeutic Recreaction (TR), Neuropsychology, Care Coordinator, Rehabilitation Medicine, Nutrition Services, Pharmacy Services and Other  Weekly team conferences will be held on Wednesdays to discuss your progress.  Your Inpatient Rehabilitation Care Coordinator will talk with you frequently to get your input and to update you on team discussions.  Team conferences with you and your family in attendance may also be held.  Expected length of stay: 10-14 Days  Overall anticipated outcome: Supervision  Depending on your progress and recovery, your program may change. Your Inpatient Rehabilitation Care Coordinator will coordinate services and will keep you informed of any changes. Your Inpatient Rehabilitation Care Coordinator's name and contact numbers are listed  below.  The following services may also be recommended but are not provided by the [place]:    [place] will be made to provide these services after discharge if needed.  Arrangements include referral to agencies that provide these services.  Your insurance has been verified to be:  UNINSURED Your primary doctor is:  NO PCP  Pertinent information will be shared with  your doctor and your insurance company.  Inpatient Rehabilitation Care Coordinator:  Erlene Quan, [place] or [phone]  Information discussed with and copy given to patient by: Dyanne Iha, 04/24/2020, 10:21 AM

## 2020-04-24 NOTE — Progress Notes (Signed)
Initial Nutrition Assessment  DOCUMENTATION CODES:   Underweight,Severe malnutrition in context of social or environmental circumstances  INTERVENTION:   - Glucerna Shake po TID, each supplement provides 220 kcal and 10 grams of protein  - MVI with minerals daily  - Encourage adequate PO intake  NUTRITION DIAGNOSIS:   Severe Malnutrition related to social / environmental circumstances (hx of polysubstance abuse) as evidenced by severe fat depletion,severe muscle depletion.  GOAL:   Patient will meet greater than or equal to 90% of their needs  MONITOR:   PO intake,Supplement acceptance,Labs,Weight trends  REASON FOR ASSESSMENT:   Other (low BMI)    ASSESSMENT:   60 year old male with PMH of prior CVA in August 2021, DM, CKD stage III, HTN, HLD, polysubstance abuse. Presented 04/20/20 with acute onset of left-sided weakness. Pt found to have right MCA stroke. Admitted to CIR on 3/21.   Discussed pt with RN. Per RN, pt did not eat food brought up from kitchen but did eat food that significant other brought up from the cafeteria.  Pt sleeping soundly at time of RD visit. Spoke with significant other at bedside who reports that pt has a good appetite and typically eats well. She states that pt has had some weight loss but is unsure how much or timeframe. She reports pt's UBW as 130 lbs. Pt's significant other has brought in outside food (soups, tuna) for pt to eat between meals.  RD will order oral nutrition supplements to aid pt in meeting kcal and protein needs during admission. Will also order daily MVI.  No weight history available in chart to review.  Meal Completion: 50%  Medications reviewed and include: colace, pepcid, SSI q 4 hours  Labs reviewed: cholesterol 229, TG 238, creatinine 1.95 (trending down) CBG's: 82-186 x 24 hours  NUTRITION - FOCUSED PHYSICAL EXAM:  Flowsheet Row Most Recent Value  Orbital Region Severe depletion  Upper Arm Region Severe  depletion  Thoracic and Lumbar Region Unable to assess  Buccal Region Severe depletion  Temple Region Moderate depletion  Clavicle Bone Region Severe depletion  Clavicle and Acromion Bone Region Severe depletion  Scapular Bone Region Moderate depletion  Dorsal Hand Moderate depletion  Patellar Region Moderate depletion  Anterior Thigh Region Moderate depletion  Posterior Calf Region Moderate depletion  Edema (RD Assessment) None  Hair Reviewed  Eyes Reviewed  Mouth Reviewed  Skin Reviewed  Nails Reviewed       Diet Order:   Diet Order            Diet Carb Modified Fluid consistency: Thin; Room service appropriate? Yes  Diet effective now                 EDUCATION NEEDS:   No education needs have been identified at this time  Skin:  Skin Assessment: Skin Integrity Issues: Incisions: right thigh  Last BM:  04/23/20 type 7  Height:   Ht Readings from Last 1 Encounters:  04/23/20 '5\' 6"'$  (1.676 m)    Weight:   Wt Readings from Last 1 Encounters:  04/23/20 49.2 kg    BMI:  Body mass index is 17.51 kg/m.  Estimated Nutritional Needs:   Kcal:  1800-2000  Protein:  80-95 grams  Fluid:  1.8 L/day    Gustavus Bryant, MS, RD, LDN Inpatient Clinical Dietitian Please see [facility] for contact information.

## 2020-04-24 NOTE — Evaluation (Signed)
Physical Therapy Assessment and Plan  Patient Details  Name: Bobby Branch MRN: [id number] Date of Birth: January 21, 1961  PT Diagnosis: Abnormal posture, Abnormality of gait, Difficulty walking, Edema, Hemiparesis non-dominant, Hemiplegia non-dominant, Hypotonia, Impaired cognition, Impaired sensation and Pain in joint Rehab Potential: Good ELOS: ~3.5-4 weeks   Today's Date: 04/24/2020 PT Individual Time: 1009-1105 PT Individual Time Calculation (min): 56 min    Hospital Problem: Principal Problem:   Right middle cerebral artery stroke [facility])   Past Medical History:  Past Medical History:  Diagnosis Date  . Stroke [facility]) 09/2019   Past Surgical History:  Past Surgical History:  Procedure Laterality Date  . IR ANGIO EXTRACRAN SEL COM CAROTID INNOMINATE UNI L MOD SED  04/20/2020  . IR CT HEAD LTD  04/20/2020  . IR PERCUTANEOUS ART THROMBECTOMY/INFUSION INTRACRANIAL INC DIAG ANGIO  04/20/2020  . IR US GUIDE VASC ACCESS RIGHT  04/20/2020  . RADIOLOGY WITH ANESTHESIA N/A 04/20/2020   Procedure: IR WITH ANESTHESIA;  Surgeon: Luanne Bras, MD;  Location: [place];  Service: Radiology;  Laterality: N/A;    Assessment & Plan Clinical Impression: Patient is a 60 y.o. year old right-handed male with 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  Per chart review lives with his girlfriend and modified independent.    Presented 04/20/2020 with acute onset of left-sided weakness.  Cranial CT scan showed asymmetric hyperdensity involving the right M2/M3 branches concerning for thrombus.  Few remote lacunar infarcts about the bilateral basal ganglia.  CT angiogram of head and neck as well as CT cerebral perfusion scan showed acute 15 cc core infarct involving the posterior right MCA distribution with a 67 cc surrounding ischemic penumbra.  Patient underwent TICI 3 right MCA  revascularization per interventional radiology.  MRI follow-up showed right MCA patchy small acute infarct with possible petechial hemorrhage.  Carotid Dopplers with right 60 to 79% left ICA 80 to 99% stenosis.  Echocardiogram with ejection fraction of 55 to 60% no wall motion abnormalities grade 1 diastolic dysfunction.  Admission chemistries glucose 231 creatinine 2.10 hemoglobin 13.6 hemoglobin A1c 7.3 urine drug screen positive cocaine as well as marijuana.  Initially maintained on Cleviprex for blood pressure control.  Neurology follow-up presently on aspirin 325 mg daily and Plavix 75 mg daily x3 months.  In regards to patient's bilateral carotid stenosis Dr. Earleen Newport of interventional radiology to follow-up outpatient for potential revascularization treatment.  He is tolerating a regular consistency diet.  Therapy evaluations completed due to patient's left-sided weakness recommendations of physical medicine rehab consult patient was admitted for a comprehensive rehab program. He currently complains of severe left sided weakness. .  Patient transferred to CIR on 04/23/2020 .   Patient currently requires max with mobility secondary to muscle paralysis, decreased cardiorespiratoy endurance, impaired timing and sequencing, abnormal tone, unbalanced muscle activation and decreased motor planning,  , decreased attention to left, decreased attention, decreased awareness, decreased problem solving and decreased safety awareness and decreased sitting balance, decreased standing balance, decreased postural control, hemiplegia and decreased balance strategies.  Prior to hospitalization, patient was independent  with mobility and lived with Significant other in a [place] home. No steps to enter home.    Patient will benefit from skilled PT intervention to maximize safe functional mobility, minimize fall risk and decrease caregiver burden for planned discharge home with 24 hour assist.  Anticipate patient will benefit  from follow up [place] at  discharge.  PT - End of Session Activity Tolerance: Decreased this session;Tolerates 30+ min activity with multiple rests (Fell asleep when PT briefly stepped out of room and pt asleep and lethargic after woken up upon returning)) Endurance Deficit: Yes PT Assessment Rehab Potential (ACUTE/IP ONLY): Good PT Barriers to Discharge: Nutrition means;Incontinence;Behavior PT Patient demonstrates impairments in the following area(s): Balance;Nutrition;Skin Integrity;Behavior;Pain;Edema;Perception;Endurance;Safety;Motor;Sensory PT Transfers Functional Problem(s): Bed Mobility;Bed to Chair;Car;Furniture PT Locomotion Functional Problem(s): Ambulation;Wheelchair Mobility;Stairs PT Plan PT Intensity: Minimum of 1-2 x/day ,45 to 90 minutes PT Frequency: 5 out of 7 days PT Duration Estimated Length of Stay: ~3.5-4 weeks PT Treatment/Interventions: Ambulation/gait training;Cognitive remediation/compensation;DME/adaptive equipment instruction;Functional mobility training;Discharge planning;Pain management;Psychosocial support;Splinting/orthotics;Therapeutic Activities;UE/LE Strength taining/ROM;Visual/perceptual remediation/compensation;Wheelchair propulsion/positioning;UE/LE Coordination activities;Therapeutic Exercise;Stair training;Skin care/wound management;Patient/family education;Neuromuscular re-education;Functional electrical stimulation;Disease management/prevention;Community reintegration;Balance/vestibular training PT Transfers Anticipated Outcome(s): CGA PT Locomotion Anticipated Outcome(s): Min assist with LRAD PT Recommendation Recommendations for Other Services: Neuropsych consult Follow Up Recommendations: 24 hour supervision/assistance;Home health PT Patient destination: Home Equipment Recommended: To be determined   PT Evaluation Precautions/Restrictions Precautions Precautions: Fall;Other (comment) Precaution Comments: L hemiplegia, L  inattention Restrictions Weight Bearing Restrictions: No Pain Pain Assessment Pain Scale: 0-10 Pain Score: 6  Pain Location: Arm Pain Orientation: Left Pain Descriptors / Indicators: Aching Pain Onset: On-going Pain Intervention(s): Repositioned;RN made aware Home Living/Prior Functioning Home Living Living Arrangements: Spouse/significant other Available Help at Discharge: Family;Available 24 hours/day Type of Home: Apartment Home Access: Level entry (Handicap accessible) Home Layout: One level Bathroom Shower/Tub: Walk-in shower;Tub/shower unit Bathroom Toilet: Standard Bathroom Accessibility: Yes Additional Comments: Fiance confirming information  Lives With: Significant other Prior Function Level of Independence: Independent with basic ADLs;Independent with homemaking with ambulation;Independent with gait;Independent with transfers Comments: Pt performing ADLs. No use of DME for mobility. Fiance assisted with driving Vision/Perception  Vision - Assessment Alignment/Gaze Preference: Head turned;Gaze right Perception Perception: Impaired Inattention/Neglect: Does not attend to left side of body;Does not attend to left visual field Praxis Praxis: Impaired Praxis Impairment Details: Motor planning  Cognition Overall Cognitive Status: Difficult to assess Arousal/Alertness: Awake/alert (Initially awake/alert. Fell asleep when PT briefly stepped out of room and pt asleep and lethargic after woken up upon returning) Orientation Level: Oriented X4 Attention: Focused;Sustained Focused Attention: Impaired (Easily distracted with fiance in room) Awareness: Impaired Problem Solving: Impaired Safety/Judgment: Impaired Sensation Sensation Light Touch: Appears Intact (Crude touch intact) Light Touch Impaired Details: Impaired LUE Hot/Cold: Not tested Proprioception: Impaired Detail Proprioception Impaired Details: Impaired LLE Stereognosis: Not tested Coordination Gross  Motor Movements are Fluid and Coordinated: No Fine Motor Movements are Fluid and Coordinated: No Coordination and Movement Description: Gross motor movements impaired due to L hemiplegia, impaired trunk control, L inattention. Heel Shin Test: Unable due to LLE flacidity Motor  Motor Motor: Hemiplegia;Abnormal tone;Abnormal postural alignment and control Motor - Skilled Clinical Observations: LUE and LLE hemiparesis   Trunk/Postural Assessment  Cervical Assessment Cervical Assessment: Exceptions to [facility] (R gaze preference; cervical flexion and R cervical rotation at rest) Thoracic Assessment Thoracic Assessment: Exceptions to [facility] (Excess thoracic kyphosis) Lumbar Assessment Lumbar Assessment: Exceptions to [facility] (Posterior pelvic tilt in sitting) Postural Control Postural Control: Deficits on evaluation Trunk Control: Unable to maintain independent sitting balance (may be due to lethargy) Righting Reactions: Increased LOB to the left with functional reaching during LB bathing tasks from the recliner.  Balance Balance Balance Assessed: Yes Static Sitting Balance Static Sitting - Balance Support: Feet supported Static Sitting - Level of Assistance: 2: Max assist;3: Mod assist Dynamic Sitting Balance Dynamic Sitting - Balance Support:  Feet supported Dynamic Sitting - Level of Assistance: 2: Max assist Static Standing Balance Static Standing - Balance Support: During functional activity Static Standing - Level of Assistance: 2: Max assist Dynamic Standing Balance Dynamic Standing - Balance Support: During functional activity Dynamic Standing - Level of Assistance: 2: Max assist Extremity Assessment  RLE Assessment RLE Assessment: Within Functional Limits Passive Range of Motion (PROM) Comments: WFL Active Range of Motion (AROM) Comments: WFL General Strength Comments: 4/5 throughout LLE Assessment LLE Assessment: Exceptions to [facility] Passive Range of Motion (PROM) Comments: Knee flex  ~20-30 degrees limited Active Range of Motion (AROM) Comments: Flaccid throughout General Strength Comments: 0/5 throughout during supine MMT. No contraction felt. LLE Tone LLE Tone: Flaccid  Care Tool Care Tool Bed Mobility Roll left and right activity   Roll left and right assist level: Maximal Assistance - Patient 25 - 49% (Min assist to L side, Max assist to R side)    Sit to lying activity   Sit to lying assist level: Maximal Assistance - Patient 25 - 49%    Lying to sitting edge of bed activity   Lying to sitting edge of bed assist level: Maximal Assistance - Patient 25 - 49%     Care Tool Transfers Sit to stand transfer   Sit to stand assist level: Maximal Assistance - Patient 25 - 49%    Chair/bed transfer   Chair/bed transfer assist level: Maximal Assistance - Patient 25 - 49%     Toilet transfer Toilet transfer activity did not occur: Safety/medical concerns      Scientist, product/process development transfer activity did not occur: Safety/medical concerns        Care Tool Locomotion Ambulation Ambulation activity did not occur: Safety/medical concerns        Walk 10 feet activity Walk 10 feet activity did not occur: Safety/medical concerns       Walk 50 feet with 2 turns activity Walk 50 feet with 2 turns activity did not occur: Safety/medical concerns      Walk 150 feet activity Walk 150 feet activity did not occur: Safety/medical concerns      Walk 10 feet on uneven surfaces activity Walk 10 feet on uneven surfaces activity did not occur: Safety/medical concerns      Stairs Stair activity did not occur: Safety/medical concerns        Walk up/down 1 step activity Walk up/down 1 step or curb (drop down) activity did not occur: Safety/medical concerns        Walk up/down 4 steps activity   Walk up/down 4 steps activity did not occur: Safety/medical concerns    Walk up/down 12 steps activity Walk up/down 12 steps activity did not occur: Safety/medical concerns       Pick up small objects from floor Pick up small object from the floor (from standing position) activity did not occur: Safety/medical concerns      Wheelchair Will patient use wheelchair at discharge?:  (TBD)         Wheel 50 feet with 2 turns activity      Wheel 150 feet activity        Refer to Care Plan for Long Term Goals  SHORT TERM GOAL WEEK 1 PT Short Term Goal 1 (Week 1): Pt will perform bed mobility mod assist. PT Short Term Goal 2 (Week 1): Pt will perform bed to chair transfer mod assist. PT Short Term Goal 3 (Week 1): Pt will initiate gait training. PT Short Term Goal 4 (  Week 1): Pt will perform sit to stand mod assist.  Recommendations for other services: Neuropsych  Skilled Therapeutic Intervention Evaluation completed (see details above) with patient education regarding purpose of PT evaluation, PT POC and goals, therapy schedule, weekly team meetings, and other CIR information including safety plan and fall risk safety. Pt received lying in bed with fiance in room. Agreeable to therapy and complains of ongoing L arm pain (6/10), but says he is "okay." Pt demonstrated easily distracted attention and is hyperverbose, particularly when conversing with fiance. Therapist retrieved 18x16 manual WC with cushion and LUE half lap tray to increase upright OOB activity tolerance. Pt asleep when therapist returned to room and lethargic upon waking, but agreeable to continue with therapy. Pt instructed to roll to L side via verbal cuing to bend R knee and push towards L side, and grab/pull L bed rail with RUE. Pt LUE protected due to flaccidity, and min assist provided. Side lying to sit max assist with verbal cuing to push up with RUE. LLE tendency to ER in sitting. LLE physically kept in neutral and foot re-placed to increase knee flexion to promote WB, and pt transferred squat pivot to WC towards R max assist with verbal cuing to lean towards L side into therapist and push with RUE  and RLE to assist. Pt left in [place] beside fiance to eat lunch with seat belt alarm turned on and needs within reach. RN notified about LUE pain.  Mobility Bed Mobility Bed Mobility: Rolling Left;Left Sidelying to Sit Rolling Left: Minimal Assistance - Patient > 75% Left Sidelying to Sit: Maximal Assistance - Patient 25-49% Transfers Transfers: Squat Pivot Transfers Sit to Stand: Maximal Assistance - Patient 25-49% Stand to Sit: Maximal Assistance - Patient 25-49% Stand Pivot Transfers: Maximal Assistance - Patient 25 - 49% Squat Pivot Transfers: Maximal Assistance - Patient 25-49% Transfer (Assistive device): None Locomotion  Gait Ambulation: No Gait Gait: No Wheelchair Mobility Wheelchair Mobility: No   Discharge Criteria: Patient will be discharged from PT if patient refuses treatment 3 consecutive times without medical reason, if treatment goals not met, if there is a change in medical status, if patient makes no progress towards goals or if patient is discharged from hospital.  The above assessment, treatment plan, treatment alternatives and goals were discussed and mutually agreed upon: by patient and by family  Eleonore Chiquito 04/24/2020, 8:07 AM

## 2020-04-25 DIAGNOSIS — E43 Unspecified severe protein-calorie malnutrition: Secondary | ICD-10-CM | POA: Insufficient documentation

## 2020-04-25 LAB — GLUCOSE, CAPILLARY
Glucose-Capillary: 116 mg/dL — ABNORMAL HIGH (ref 70–99)
Glucose-Capillary: 141 mg/dL — ABNORMAL HIGH (ref 70–99)
Glucose-Capillary: 169 mg/dL — ABNORMAL HIGH (ref 70–99)
Glucose-Capillary: 199 mg/dL — ABNORMAL HIGH (ref 70–99)
Glucose-Capillary: 217 mg/dL — ABNORMAL HIGH (ref 70–99)
Glucose-Capillary: 56 mg/dL — ABNORMAL LOW (ref 70–99)
Glucose-Capillary: 64 mg/dL — ABNORMAL LOW (ref 70–99)
Glucose-Capillary: 66 mg/dL — ABNORMAL LOW (ref 70–99)
Glucose-Capillary: 84 mg/dL (ref 70–99)

## 2020-04-25 MED ORDER — LIVING WELL WITH DIABETES BOOK
Freq: Once | Status: AC
  Filled 2020-04-25: qty 1

## 2020-04-25 MED ORDER — TRAZODONE HCL 50 MG PO TABS
50.0000 mg | ORAL_TABLET | Freq: Every day | ORAL | Status: DC
  Administered 2020-04-25: 50 mg via ORAL
  Filled 2020-04-25: qty 1

## 2020-04-25 MED ORDER — BLOOD PRESSURE CONTROL BOOK
Freq: Once | Status: AC
  Filled 2020-04-25: qty 1

## 2020-04-25 MED ORDER — DICLOFENAC SODIUM 1 % EX GEL
2.0000 g | Freq: Four times a day (QID) | CUTANEOUS | Status: DC
  Administered 2020-04-25 – 2020-05-22 (×100): 2 g via TOPICAL
  Filled 2020-04-25 (×2): qty 100

## 2020-04-25 NOTE — Progress Notes (Signed)
Physical Therapy Session Note  Patient Details  Name: Bobby Branch MRN: [id number] Date of Birth: [birth year]  Today's Date: 04/25/2020 PT Individual Time: [id number] and [id number] PT Individual Time Calculation (min): 55 min and 36 min  Short Term Goals: Week 1:  PT Short Term Goal 1 (Week 1): Pt will perform bed mobility mod assist. PT Short Term Goal 2 (Week 1): Pt will perform bed to chair transfer mod assist. PT Short Term Goal 3 (Week 1): Pt will initiate gait training. PT Short Term Goal 4 (Week 1): Pt will perform sit to stand mod assist.  Skilled Therapeutic Interventions/Progress Updates:    Session 1  Pt received supine in bed with NT and MD in room. Agreeable to therapy. Pt verbalized still having L shoulder pain and MD prescribed medication gel to help alleviate this. MD and NT left room and therapist assumed care of pt. Therapist assisted pt with pericare and donning clean brief via cuing to roll both ways (min assist to L sidelying via verbal cuing to bend R knee and push towards L side, and grab/pull L bed rail with RUE; max assist to R sidelying with LLE flexion into hooklying). Ted hose and pants donned with verbal cuing to lift LLE but no movement observed; pt cued and able to bridge into sufficient hip extension to complete final step of pant donning. Pt cued to roll into L sidelying min assist with same cues used previously in session. Sidelying to sitting max assist with verbal cuing to push with RUE. LLE tendency to ER in sitting. LLE physically kept in neutral and foot re-placed to increase knee flexion to promote WB, and pt transferred to [facility] towards R squat pivot max assist with verbal cuing to lean towards R side into therapist and therapist facilitating LLE for NMR. Pt wheeled to therapy gym and transferred to mat towards L squat pivot max assist with verbal cuing to lean into his R side towards therapist. LE NMR via STS max assist with verbal cuing to extend L knee and tuck  hips into posterior pelvic tilt. No quad contraction felt during standing with minimal glute contraction. Pt leans heavily into therapist on L side. While in standing, verbally and tactile cuing on upper anterior trunk to "look up and bring shoulders back" to facilitate scapular retraction and thoracic extension for better standing posture, although pt demonstrates little-no ability to perform this motion independently. STS performed 4x with ~30" standing time on each rep. Pt transferred back to [facility] towards R squat pivot max assist with same cues as before and wheeled back to room. NT cleaning bed upon arrival and pt left in [facility] with seat belt alarm turned on and needs within reach.  Session 2 Pt received eating lunch in bed with HOB elevated. Pt did not report pain and agreeable to therapy. Transferred to L sidelying min assist via verbal cuing to bend R knee and push towards L side, and grab/pull L bed rail with RUE. Sidelying to sitting EOB max assist with verbal cuing to push with RUE to bring trunk upright. Pt continues to ER LLE and when corrected to neutral, ankle still remains in supination unless manually moved. LLE physically kept in neutral and foot re-placed to increase knee flexion to promote WB, and STS max assist with verbal cuing to extend L knee and tuck hips into posterior pelvic tilt. While in standing, verbally and tactile cuing on upper anterior trunk to "look up and bring shoulders back" to facilitate scapular  retraction and thoracic extension for better standing posture. Pre-gait NMR via L leg stance and R leg forward/backward stepping (performed 3x5 steps back/forward with sitting rest break). Pt cued to shift weight of hips towards L side. Occasionally tries to lift RLE but gets "stuck" and verbalizes "it just won't move sometimes." Pt achieved greater success when given external cue to step towards (therapist's foot). Therapist knees used to extend knee from pt's side to prevent knee  buckling and therapist hand used to cue posterior pelvic tilt. No L quad contraction felt during standing with minimal glute contraction. Pt transferred back into supine max assist with HOB elevated to continue meal. Bed alarm turned on and needs within reach.  Therapy Documentation Precautions:  Precautions Precautions: Fall,Other (comment) Precaution Comments: L hemiplegia, L inattention Restrictions Weight Bearing Restrictions: No   Therapy/Group: Individual Therapy  Eleonore Chiquito, SPT 04/25/2020, 10:36 AM

## 2020-04-25 NOTE — Patient Care Conference (Signed)
Inpatient RehabilitationTeam Conference and Plan of Care Update Date: 04/25/2020   Time: 10:48 AM    Patient Name: Bobby Branch      Medical Record Number: [id number]  Date of Birth: 1960-10-19 Sex: Male         Room/Bed: 4M05C/4M05C-01 Payor Info: Payor: MEDICAID PENDING / Plan: MEDICAID PENDING / Product Type: *No Product type* /    Admit Date/Time:  04/23/2020  6:20 PM  Primary Diagnosis:  Right middle cerebral artery stroke [facility])  Hospital Problems: Principal Problem:   Right middle cerebral artery stroke [facility]) Active Problems:   Protein-calorie malnutrition, severe    Expected Discharge Date: Expected Discharge Date: 05/22/20  Team Members Present: Physician leading conference: Dr. Leeroy Cha Care Coodinator Present: Dorien Chihuahua, RN, BSN, CRRN;Christina [place], [place] Nurse Present: Dorien Chihuahua, RN PT Present: Page Spiro, PT OT Present: Clyda Greener, OT SLP Present: Jettie Booze, CF-SLP PPS Coordinator present : Ileana Ladd, PT     Current Status/Progress Goal Weekly Team Focus  Bowel/Bladder   Incontinent of bowel and bladder.  Keep patient clean and dry.  Assess for incontinence every round.   Swallow/Nutrition/ Hydration             ADL's   Mod assist for bathing with max assist for dressing.  Max assist for all LB selfcare with max assist for transfers stand pivot.  He demonstrates Brunnstrum stage I-II movement in the left arm and hand.  Increased tone noted in the elbow and digit flexors.  Decreased sustained attention to tasks as well as left inattention  supervision to min assist overall  selfcare retraining, transfer training, balance retraining, DME education NMES, neuromuscular re-education, therapeutic exercise, pt/family education   Mobility   Supine to L sidelying min assist. Supine to R sidelying max assist. Bed/Chair squat pivot transfer max assist. Sit to stand max assist. Unable to progress to gait at this time. L inattention  min assist  overall at ambulatory level  evaluation, pt/family education, bed mobility training, transfer training, L attention, L LE NMR, standing balance, pre-gait training   Communication             Safety/Cognition/ Behavioral Observations            Pain   Pain 0-6 out of 10. Tylenol effective for pain.  Pain level less than or equal to 3.  Assess for pain q shift and PRN.   Skin   Anterior thigh/groin wound healed without complication.  No breakdown or infection.  Assess wound and change dressing as ordered.     Discharge Planning:  pt to d/c home with spouse. Pt uninsured, no PCP   Team Discussion: Left shoulder pain and trouble sleeping limit progress along with poor sustained attention, left inattention with left visual deficit and requires redirection to stay on task . Trace shoulder movement and hip extensors on left. Constipation addressed.  Patient on target to meet rehab goals: yes, currently mod assist for upper body bathing and max assist for lower body care. Max assist for transfers  With min assist goals set for discharge  *See Care Plan and progress notes for long and short-term goals.   Revisions to Treatment Plan:  Add SLP eval for cognition  Teaching Needs: Transfers, toileting, secondary stroke risk management, medications, dietary modifications, etc.  Current Barriers to Discharge: Decreased caregiver support, Home enviroment access/layout and Behavior  Girlfriend able to assist and friends/other family to assist in handicapped accessible apartment  Possible Resolutions to Barriers: Family education  Medical Summary Current Status: left shoulder pain, insomnia, IV in place, anemia, CKD, hypokalemia, prior drug use  Barriers to Discharge: Medical stability  Barriers to Discharge Comments: left shoulder pain, insomnia, IV in place, anemia, CKD, hypokalemia, prior drug use Possible Resolutions to [employer] Focus: continue voltaren gel, start trazodone '50mg'$  HS,  may d/c IV, continue to monitor Hgb and Cr, continue to monitor K+, substance abuse counseling   Continued Need for Acute Rehabilitation Level of Care: The patient requires daily medical management by a physician with specialized training in physical medicine and rehabilitation for the following reasons: Direction of a multidisciplinary physical rehabilitation program to maximize functional independence : Yes Medical management of patient stability for increased activity during participation in an intensive rehabilitation regime.: Yes Analysis of laboratory values and/or radiology reports with any subsequent need for medication adjustment and/or medical intervention. : Yes   I attest that I was present, lead the team conference, and concur with the assessment and plan of the team.   Dorien Chihuahua B 04/25/2020, 2:57 PM

## 2020-04-25 NOTE — Progress Notes (Signed)
Occupational Therapy Session Note  Patient Details  Name: Bobby Branch MRN: [id number] Date of Birth: Jul 20, 1960  Today's Date: 04/25/2020 OT Individual Time: [id number] OT Individual Time Calculation (min): 32 min    Short Term Goals: Week 1:  OT Short Term Goal 1 (Week 1): Pt will complete UB bathing sitting unsupported with min assist. OT Short Term Goal 2 (Week 1): Pt will donn a pullover shirt for two consecutive sessions with min assist. OT Short Term Goal 3 (Week 1): Pt will complete toilet transfer and toileting tasks with no more than mod assist. OT Short Term Goal 4 (Week 1): Pt will integrate the LUE into bathing tasks as a stabilizer with mod assist and mod instructional cueing. OT Short Term Goal 5 (Week 1): Pt will maintain sustained attention to selfcare tasks for 15 mins with no more than min instructional cueing for re-direction.  Skilled Therapeutic Interventions/Progress Updates:    Patient in bed, per nursing had just moved to bed due to fatigue.  He denies pain but notes occ discomfort in left shoulder with ROM/positioning.  He requests left arm exercises.  Completed motor control, inhibition activities and attempts to facilitate extensors in reclined position with support of bed.  Shoulder flexion and abd to 120 without pain - occ pain with trunk mobility toward left side and resolves with rolling away - ongoing tightness of flexors with repositioning.  Lunch tray arrived, assisted with set up and positioning for upright posture with eating - he is able to feed himself with min loss of food and occ cues.  He remained in bed at close of session, bed alarm set and nursing aware to provide intermittent checks while he finishes eating.    Therapy Documentation Precautions:  Precautions Precautions: Fall,Other (comment) Precaution Comments: L hemiplegia, L inattention Restrictions Weight Bearing Restrictions: No   Therapy/Group: Individual Therapy  Carlos Levering 04/25/2020, 7:52 AM

## 2020-04-25 NOTE — Progress Notes (Signed)
Team Conference Report to Patient/Family  Team Conference discussion was reviewed with the patient and caregiver, including goals, any changes in plan of care and target discharge date.  Patient and caregiver express understanding and are in agreement.  The patient has a target discharge date of 05/22/20.  Met with patient in room (called daughter "Kasandra Knudsen" at bedside). Provided updates. Explained to dtr current barriers: pt uninsured, no SNF coverage. Daughter concerned if pt does not meet goals. Pt reports he has friends and his landlord to assist at d/c. SW will cont. to follow p with dtr with weekly updates. Pt and daughter concerned about disability and Medicaid application. SW followed up with financial counselor, called pt by phone. SW will continue to follow up.  [place], [place]  Dyanne Iha 04/25/2020, 1:14 PM

## 2020-04-25 NOTE — Progress Notes (Signed)
PROGRESS NOTE   Subjective/Complaints: Complaining of left shoulder pain Hgb trending upward Cr improving.  Hgb trending upward.   ROS- denies CP, SOB, N/V/D   Objective:   No results found. Recent Labs    04/23/20 0345 04/24/20 0504  WBC 9.2 7.1  HGB 12.4* 12.7*  HCT 37.0* 37.3*  PLT 311 306   Recent Labs    04/23/20 0345 04/24/20 0504  NA 138 138  K 3.4* 3.5  CL 108 108  CO2 24 24  GLUCOSE 91 88  BUN 13 11  CREATININE 2.11* 1.95*  CALCIUM 8.4* 8.1*    Intake/Output Summary (Last 24 hours) at 04/25/2020 1052 Last data filed at 04/25/2020 0826 Gross per 24 hour  Intake 360 ml  Output 700 ml  Net -340 ml        Physical Exam: Vital Signs Blood pressure (!) 172/85, pulse 60, temperature 97.8 F (36.6 C), resp. rate 17, height '5\' 6"'$  (1.676 m), weight 49.2 kg, SpO2 97 %. Gen: no distress, normal appearing HEENT: oral mucosa pink and moist, NCAT Cardio: Reg rate Chest: normal effort, normal rate of breathing Abd: soft, non-distended Ext: no edema Psych: pleasant, normal affect Skin: intact Neurologic: Cranial nerves II through XII intact, motor strength is 5/5 in RIght and 0/5 left  deltoid, bicep, tricep, grip, hip flexor, knee extensors, ankle dorsiflexor and plantar flexor Sensory exam reduced  sensation to light touch in LUE Increase in left Finger flexor tone MAS 2 Musculoskeletal: Full range of motion in all 4 extremities. No joint swelling   Assessment/Plan: 1. Functional deficits which require 3+ hours per day of interdisciplinary therapy in a comprehensive inpatient rehab setting.  Physiatrist is providing close team supervision and 24 hour management of active medical problems listed below.  Physiatrist and rehab team continue to assess barriers to discharge/monitor patient progress toward functional and medical goals  Care Tool:  Bathing    Body parts bathed by patient: Left  arm,Chest,Abdomen,Right upper leg,Left upper leg,Face   Body parts bathed by helper: Front perineal area,Buttocks,Right arm,Right lower leg,Left lower leg     Bathing assist Assist Level: Maximal Assistance - Patient 24 - 49%     Upper Body Dressing/Undressing Upper body dressing   What is the patient wearing?: Pull over shirt    Upper body assist Assist Level: Maximal Assistance - Patient 25 - 49%    Lower Body Dressing/Undressing Lower body dressing      What is the patient wearing?: Incontinence brief     Lower body assist Assist for lower body dressing: Maximal Assistance - Patient 25 - 49%     Toileting Toileting    Toileting assist Assist for toileting: Maximal Assistance - Patient 25 - 49%     Transfers Chair/bed transfer  Transfers assist     Chair/bed transfer assist level: Maximal Assistance - Patient 25 - 49%     Locomotion Ambulation   Ambulation assist   Ambulation activity did not occur: Safety/medical concerns          Walk 10 feet activity   Assist  Walk 10 feet activity did not occur: Safety/medical concerns        Walk  50 feet activity   Assist Walk 50 feet with 2 turns activity did not occur: Safety/medical concerns         Walk 150 feet activity   Assist Walk 150 feet activity did not occur: Safety/medical concerns         Walk 10 feet on uneven surface  activity   Assist Walk 10 feet on uneven surfaces activity did not occur: Safety/medical concerns         Wheelchair     Assist Will patient use wheelchair at discharge?:  (TBD)             Wheelchair 50 feet with 2 turns activity    Assist            Wheelchair 150 feet activity     Assist          Blood pressure (!) 172/85, pulse 60, temperature 97.8 F (36.6 C), resp. rate 17, height '5\' 6"'$  (1.676 m), weight 49.2 kg, SpO2 97 %. Medical Problem List and Plan: 1.  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  Impaired mobility: -DVT/anticoagulation: Continue SCDs             -antiplatelet therapy: Continue Aspirin 325 mg daily and Plavix 75 mg daily x3 months 3. Pain Management: Tylenol as needed. Add voltaren gel for right shoulder pain 4. Depression: Continue Lexapro 10 mg daily             -antipsychotic agents: N/A 5. Neuropsych: This patient is capable of making decisions on his own behalf. 6. Skin/Wound Care: Routine skin checks 7. Fluids/Electrolytes/Nutrition: Routine in and outs with follow-up chemistries 8.  Hypertension.  BP has been labile- continue Norvasc 10 mg daily.  Monitor with increased mobility 9.  Diabetes mellitus.  Hemoglobin A1c 7.3.  SSI.  Patient on Glucophage 500 mg twice daily prior to admission.  Resume as needed CBG (last 3)  Recent Labs    04/25/20 0001 04/25/20 0402 04/25/20 0802  GLUCAP 141* 84 199*   Controlled  10.  Hyperlipidemia.  Lipitor 11.  Polysubstance abuse.  Urine drug screen positive cocaine as well as marijuana.  ask neuropsych to provide  Counseling, pt plans to attend NA as OP  Tobacco abuse add nicoderm patch 42mg 12.  CKD stage III.  BMET stable  13. Anemia: trending upward, continue to monitor.     LOS: 2 days A FACE TO FACE EVALUATION WAS PERFORMED  KClide DeutscherRaulkar 04/25/2020, 10:52 AM

## 2020-04-25 NOTE — Progress Notes (Signed)
Occupational Therapy Session Note  Patient Details  Name: Bobby Branch MRN: [id number] Date of Birth: [birth year]  Today's Date: 04/25/2020 OT Individual Time: 1303-1420 OT Individual Time Calculation (min): 77 min    Short Term Goals: Week 1:  OT Short Term Goal 1 (Week 1): Pt will complete UB bathing sitting unsupported with min assist. OT Short Term Goal 2 (Week 1): Pt will donn a pullover shirt for two consecutive sessions with min assist. OT Short Term Goal 3 (Week 1): Pt will complete toilet transfer and toileting tasks with no more than mod assist. OT Short Term Goal 4 (Week 1): Pt will integrate the LUE into bathing tasks as a stabilizer with mod assist and mod instructional cueing. OT Short Term Goal 5 (Week 1): Pt will maintain sustained attention to selfcare tasks for 15 mins with no more than min instructional cueing for re-direction.  Skilled Therapeutic Interventions/Progress Updates:    Pt worked on showering and dressing during session.  Max assist for rolling to the left side of the bed and for transition to sitting.  He then needed max assist for stand pivot transfer to the wheelchair as well as to the tub bench.  Min to mod assist was needed for sitting balance on the tub bench while bathing secondary to LOB to the left.  He needed max hand over hand assist to integrate the LUE as a gross assist with holding the soap as well as pouring it onto the washcloth.  Max assist was also needed for integrating it for washing the RUE and shoulder.  He required mod assist for UB bathing with max assist for LB bathing sit to stand.  Max instructional cueing for sequencing task as well secondary to internal distraction with conversation.  He transferred out to the sink for dressing with mod assist for donning a pullover shirt following hemi techniques.  Max assist was needed for donning his pants as well.  Finished with total assist for TEDs and max assist to donn his gripper socks.  Noted  increased tone in the LUE elbow flexors, internal rotators, and digit flexors.  Attempted to donn pt's resting hand splint, however it is the incorrect size and is too small.  Notified nursing to get him a larger size.  Therapist placed half lap tray on the left side as well as folded up washcloths in the left hand to assist with positioning.  Call button and phone in reach with safety belt in place.      Therapy Documentation Precautions:  Precautions Precautions: Fall,Other (comment) Precaution Comments: L hemiplegia, L inattention Restrictions Weight Bearing Restrictions: No  Pain: Pain Assessment Pain Scale: Faces Faces Pain Scale: Hurts a little bit Pain Type: Neuropathic pain Pain Location: Shoulder Pain Orientation: Left Pain Descriptors / Indicators: Discomfort Pain Onset: With Activity Pain Intervention(s): Repositioned;Emotional support ADL: See Care Tool Section for some details of mobility and selfcare  Therapy/Group: Individual Therapy  Lashanna Angelo OTR/L 04/25/2020, 3:58 PM

## 2020-04-25 NOTE — Progress Notes (Signed)
Patient ID: Bobby Branch, male   DOB: 02/26/1960, 59 y.o.   MRN: [id number] Met with the patient to review role of the nurse CM and address educational needs. Reviewed previous CVA (pt reports TIA) August 2021 in [place] and patient noted he went to a SNF for 21 days post. Was able to recover and moved to [place]. Has his own apt for about 2.5 weeks before he had the stroke. Reports landlord and friends to assist at discharge. Familiar with co-morbidities DM, HLD and HTN. Reported he had prescriptions for medications and had made dietary modifications to address the issues however he ran out of medications and did not have funding to get additional medications. Friends were not reliable to help transport to physician appointments. Noted DM w A1C of 10.0 at last stroke so now down to 7.3, actions taken to address were working. Reviewed HLD and Triglyceride level of 238 and LDL 98 on lipitor. DAPT for 3 months per MD. Patient given handouts on eating right after a stroke, DM and HTN handbook along with dietary modifications for high triglycerides. Continue to follow along to discharge to address educational needs. Collaborate with SW to facilitate preparation for discharge. [place], Deborah B  

## 2020-04-26 LAB — GLUCOSE, CAPILLARY
Glucose-Capillary: 105 mg/dL — ABNORMAL HIGH (ref 70–99)
Glucose-Capillary: 159 mg/dL — ABNORMAL HIGH (ref 70–99)
Glucose-Capillary: 165 mg/dL — ABNORMAL HIGH (ref 70–99)
Glucose-Capillary: 169 mg/dL — ABNORMAL HIGH (ref 70–99)
Glucose-Capillary: 178 mg/dL — ABNORMAL HIGH (ref 70–99)
Glucose-Capillary: 178 mg/dL — ABNORMAL HIGH (ref 70–99)
Glucose-Capillary: 188 mg/dL — ABNORMAL HIGH (ref 70–99)

## 2020-04-26 MED ORDER — TIZANIDINE HCL 2 MG PO TABS
4.0000 mg | ORAL_TABLET | Freq: Every day | ORAL | Status: DC
  Administered 2020-04-26 – 2020-05-08 (×13): 4 mg via ORAL
  Filled 2020-04-26 (×13): qty 2

## 2020-04-26 MED ORDER — POLYETHYLENE GLYCOL 3350 17 G PO PACK
17.0000 g | PACK | Freq: Two times a day (BID) | ORAL | Status: DC
  Administered 2020-04-26 – 2020-04-30 (×10): 17 g via ORAL
  Filled 2020-04-26 (×9): qty 1

## 2020-04-26 MED ORDER — LIDOCAINE 5 % EX PTCH
1.0000 | MEDICATED_PATCH | CUTANEOUS | Status: DC
  Administered 2020-04-26 – 2020-05-22 (×26): 1 via TRANSDERMAL
  Filled 2020-04-26 (×27): qty 1

## 2020-04-26 MED ORDER — TIZANIDINE HCL 2 MG PO TABS: 4.0000 mg | ORAL_TABLET | Freq: Every day | ORAL | Status: DC

## 2020-04-26 NOTE — IPOC Note (Signed)
Overall Plan of Care [facility]) Patient Details Name: Bobby Branch MRN: [id number] DOB: 1960/02/14  Admitting Diagnosis: Right middle cerebral artery stroke [facility])  Hospital Problems: Principal Problem:   Right middle cerebral artery stroke ([place]) Active Problems:   Protein-calorie malnutrition, severe     Functional Problem List: Nursing Bladder,Bowel,Endurance,Medication Management,Motor,Safety,Sensory  PT Balance,Nutrition,Skin Integrity,Behavior,Pain,Edema,Perception,Endurance,Safety,Motor,Sensory  OT Balance,Cognition,Endurance,Motor,Perception,Safety,Sensory,Vision  SLP Cognition  TR         Basic ADL's: OT Eating,Grooming,Bathing,Dressing,Toileting     Advanced  ADL's: OT       Transfers: PT Bed Mobility,Bed to Chair,Car,Furniture  OT Toilet,Tub/Shower     Locomotion: PT Ambulation,Wheelchair Mobility,Stairs     Additional Impairments: OT Fuctional Use of Upper Extremity  SLP Social Cognition   Problem Solving,Attention,Memory,Awareness  TR      Anticipated Outcomes Item Anticipated Outcome  Self Feeding modified independent  Swallowing      Basic self-care  supervision to min assist  Toileting  min assist   Bathroom Transfers min assist  Bowel/Bladder  To be continent of bowel/bladder with mod I  Transfers  CGA  Locomotion  Min assist with LRAD  Communication     Cognition  Supervision  Pain  no complain of pain  Safety/Judgment  able to call for help and express neeeds with mod I   Therapy Plan: PT Intensity: Minimum of 1-2 x/day ,45 to 90 minutes PT Frequency: 5 out of 7 days PT Duration Estimated Length of Stay: ~3.5-4 weeks OT Intensity: Minimum of 1-2 x/day, 45 to 90 minutes OT Frequency: 5 out of 7 days OT Duration/Estimated Length of Stay: 23-26 days SLP Intensity: Minumum of 1-2 x/day, 30 to 90 minutes SLP Frequency: 3 to 5 out of 7 days SLP Duration/Estimated Length of Stay: 05/22/20   Due to the current state of emergency,  patients may not be receiving their 3-hours of Medicare-mandated therapy.   Team Interventions: Nursing Interventions Patient/Family Education,Bladder Management,Medication Management,Discharge Planning,Bowel Management,Disease Management/Prevention,Cognitive Remediation/Compensation,Dysphagia/Aspiration Development worker, community  PT interventions Ambulation/gait training,Cognitive remediation/compensation,DME/adaptive equipment instruction,Functional mobility training,Discharge planning,Pain management,Psychosocial support,Splinting/orthotics,Therapeutic Activities,UE/LE Strength taining/ROM,Visual/perceptual remediation/compensation,Wheelchair propulsion/positioning,UE/LE Coordination activities,Therapeutic Exercise,Stair training,Skin care/wound management,Patient/family education,Neuromuscular re-education,Functional Insurance risk surveyor  OT Interventions Balance/vestibular training,Discharge planning,Functional electrical stimulation,Pain management,Self Care/advanced ADL retraining,Therapeutic Activities,UE/LE Coordination activities,Cognitive remediation/compensation,Disease mangement/prevention,Functional mobility training,Patient/family education,Therapeutic Exercise,Visual/perceptual remediation/compensation,DME/adaptive equipment instruction,Neuromuscular re-education,Psychosocial support,Splinting/orthotics,UE/LE Strength taining/ROM,Wheelchair propulsion/positioning  SLP Interventions Cognitive remediation/compensation,Internal/external aids,Cueing hierarchy,Patient/family education,Functional tasks  TR Interventions    SW/CM Interventions Discharge Planning,Psychosocial Support,Patient/Family Education,Disease Management/Prevention   Barriers to Discharge MD  Medical stability  Nursing      PT Nutrition means,Incontinence,Behavior    OT      SLP      SW       Team Discharge Planning: Destination:  PT-Home ,OT- Home , SLP-Home Projected Follow-up: PT-24 hour supervision/assistance,Home health PT, OT-  24 hour supervision/assistance,Home health OT, SLP-24 hour supervision/assistance,Other (comment) (follow up ST TBD) Projected Equipment Needs: PT-To be determined, OT- To be determined, SLP-  Equipment Details: PT- , OT-  Patient/family involved in discharge planning: PT- Family member/caregiver,Patient,  OT-Patient, SLP-Patient  MD ELOS: 10-14 days Medical Rehab Prognosis:  Excellent Assessment: Bobby Branch is a 60 year old man admitted to CIR with left-sided weakness secondary to right MCA distribution infarction with right M2/3 occlusion status post thrombectomy revascularization. Active medical issues include left shoulder pain and spasticity, depression, hypertension, type 2 DM, hyperlipidemia, stage III CKD, and anemia.    See Team Conference Notes for weekly updates to the plan of care

## 2020-04-26 NOTE — Plan of Care (Signed)
  Problem: RH BOWEL ELIMINATION Goal: RH STG MANAGE BOWEL WITH ASSISTANCE Description: STG Manage Bowel with Mod I Assistance. Outcome: not Progressing; LBM 3/21; Miralax 2x / day ordered

## 2020-04-26 NOTE — Progress Notes (Signed)
Blood sugar 64 at 2316. Patient asymptomatic. Ate 1/2 banana and drank 1 Glucerna and blood sugar was 66 at 2351. Ate the rest of the banana and a pack of graham crackers and sugar was 178 at 0014.

## 2020-04-26 NOTE — Evaluation (Signed)
Speech Language Pathology Assessment and Plan  Patient Details  Name: Bobby Branch MRN: [id number] Date of Birth: 11-11-1960  SLP Diagnosis: Cognitive Impairments  Rehab Potential: Good ELOS: 05/22/20    Today's Date: 04/26/2020 SLP Individual Time: 2353-6144 SLP Individual Time Calculation (min): 50 min   Hospital Problem: Principal Problem:   Right middle cerebral artery stroke ([place]) Active Problems:   Protein-calorie malnutrition, severe  Past Medical History:  Past Medical History:  Diagnosis Date  . Stroke [facility]) 09/2019   Past Surgical History:  Past Surgical History:  Procedure Laterality Date  . IR ANGIO EXTRACRAN SEL COM CAROTID INNOMINATE UNI L MOD SED  04/20/2020  . IR CT HEAD LTD  04/20/2020  . IR PERCUTANEOUS ART THROMBECTOMY/INFUSION INTRACRANIAL INC DIAG ANGIO  04/20/2020  . IR US GUIDE VASC ACCESS RIGHT  04/20/2020  . RADIOLOGY WITH ANESTHESIA N/A 04/20/2020   Procedure: IR WITH ANESTHESIA;  Surgeon: Luanne Bras, MD;  Location: [facility];  Service: Radiology;  Laterality: N/A;    Assessment / Plan / Recommendation Clinical Impression   HPI: Bobby Branch is a 60 year old right-handed male with 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  Per chart review lives with his girlfriend and modified independent.    Presented 04/20/2020 with acute onset of left-sided weakness.  Cranial CT scan showed asymmetric hyperdensity involving the right M2/M3 branches concerning for thrombus.  Few remote lacunar infarcts about the bilateral basal ganglia.  CT angiogram of head and neck as well as CT cerebral perfusion scan showed acute 15 cc core infarct involving the posterior right MCA distribution with a 67 cc surrounding ischemic penumbra.  Patient underwent TICI 3 right MCA revascularization per interventional radiology.  MRI follow-up showed right MCA patchy  small acute infarct with possible petechial hemorrhage.  Carotid Dopplers with right 60 to 79% left ICA 80 to 99% stenosis.  Echocardiogram with ejection fraction of 55 to 60% no wall motion abnormalities grade 1 diastolic dysfunction.  Admission chemistries glucose 231 creatinine 2.10 hemoglobin 13.6 hemoglobin A1c 7.3 urine drug screen positive cocaine as well as marijuana.  Initially maintained on Cleviprex for blood pressure control.  Neurology follow-up presently on aspirin 325 mg daily and Plavix 75 mg daily x3 months.  In regards to patient's bilateral carotid stenosis Dr. Earleen Newport of interventional radiology to follow-up outpatient for potential revascularization treatment.  He is tolerating a regular consistency diet.  Therapy evaluations completed due to patient's left-sided weakness recommendations of physical medicine rehab consult. Patient was admitted for a comprehensive rehab program 04/23/20. Speech was consulted during weekly conference on 04/25/20 due to PT and OT reports of impaired cognition. Therefore, ST evaluation was completed 04/26/20 with results as follows:  Although no family was present to confirm baseline, pt presents with mild cognitive impairments primarily marked by decreased selective attention, complex problem solving, short term memory, and awareness. Of note, pt does have hx of previous CVA and baseline dysarthria, but he was 90-95% intelligible throughout session. He has been tolerating his regular/thin diet without ST interventions while on CIR. Pt reported that he feels pretty "normal" in his mind, however then provided contradictory statements (ex: reported decreased short term memory). Cognistat evaluation was administered in addition to functional tasks - scores that fell outside of normal limits were all within mild range of impairment and included visual construction, memory, and calculations. His expressive and receptive language skills were [facility].  Recommend  pt receive  skilled ST services while inpatient to address cognitive deficits given high level of independence prior to admission. Pt would benefit from ST interventions to maximize his functional independence and safety prior to discharge.    Skilled Therapeutic Interventions          Cognitive-linguistic evaluation was administered and results were reviewed with pt (please see above for details regarding the results).   SLP Assessment  Patient will need skilled [facility] during CIR admission    Recommendations  Patient destination: Home Follow up Recommendations: 24 hour supervision/assistance;Other (comment) (follow up ST TBD)    SLP Frequency 3 to 5 out of 7 days   SLP Duration  SLP Intensity  SLP Treatment/Interventions 05/22/20  Minumum of 1-2 x/day, 30 to 90 minutes  Cognitive remediation/compensation;Internal/external aids;Cueing hierarchy;Patient/family education;Functional tasks    Pain Pain Assessment Pain Scale: Faces Faces Pain Scale: No hurt      SLP Evaluation Cognition Overall Cognitive Status: No family/caregiver present to determine baseline cognitive functioning Arousal/Alertness: Awake/alert Orientation Level: Oriented X4 Attention: Selective Selective Attention: Impaired Selective Attention Impairment: Verbal basic;Functional basic Memory: Impaired Memory Impairment: Retrieval deficit;Decreased short term memory;Decreased recall of new information Decreased Short Term Memory: Verbal basic;Functional complex Awareness: Impaired Awareness Impairment: Emergent impairment;Anticipatory impairment Problem Solving: Impaired Executive Function: Writer: Impaired Organizing Impairment: Verbal complex;Functional complex Safety/Judgment: Impaired  Comprehension Auditory Comprehension Overall Auditory Comprehension: Appears within functional limits for tasks assessed Conversation: Complex Visual  Recognition/Discrimination Discrimination: Within Function Limits Reading Comprehension Reading Status: Not tested Expression Expression Primary Mode of Expression: Verbal Verbal Expression Overall Verbal Expression: Appears within functional limits for tasks assessed Interfering Components: Attention Written Expression Written Expression: Not tested Oral Motor Oral Motor/Sensory Function Overall Oral Motor/Sensory Function: Mild impairment Facial ROM: Suspected CN VII (facial) dysfunction;Reduced left Facial Symmetry: Abnormal symmetry left;Suspected CN VII (facial) dysfunction Facial Strength: Reduced left;Suspected CN VII (facial) dysfunction Facial Sensation: Within Functional Limits Lingual ROM: Reduced left;Suspected CN XII (hypoglossal) dysfunction Lingual Symmetry: Abnormal symmetry left;Suspected CN XII (hypoglossal) dysfunction Lingual Strength: Reduced;Suspected CN XII (hypoglossal) dysfunction Lingual Sensation: Within Functional Limits Velum: Within Functional Limits Mandible: Within Functional Limits Motor Speech Overall Motor Speech: Impaired at baseline Respiration: Impaired Level of Impairment: Conversation Phonation: Hoarse Resonance: Within functional limits Articulation: Impaired Level of Impairment: Conversation Intelligibility: Intelligibility reduced Word: 75-100% accurate Phrase: 75-100% accurate Sentence: 75-100% accurate Conversation: 75-100% accurate Motor Planning: Witnin functional limits Motor Speech Errors: Aware;Consistent Effective Techniques: Slow rate;Increased vocal intensity;Over-articulate  Care Tool Care Tool Cognition Expression of Ideas and Wants Expression of Ideas and Wants: Some difficulty - exhibits some difficulty with expressing needs and ideas (e.g, some words or finishing thoughts) or speech is not clear   Understanding Verbal and Non-Verbal Content Understanding Verbal and Non-Verbal Content: Usually understands -  understands most conversations, but misses some part/intent of message. Requires cues at times to understand   Memory/Recall Ability *first 3 days only Memory/Recall Ability *first 3 days only: Current season;That he or she is in a hospital/hospital unit;Staff names and faces     Intelligibility: Intelligibility reduced Word: 75-100% accurate Phrase: 75-100% accurate Sentence: 75-100% accurate Conversation: 75-100% accurate   Short Term Goals: Week 1: SLP Short Term Goal 1 (Week 1): Pt will demonstate ability to use compensatory memory strategies for semi-complex tasks/recall with Min A verbal/visual cues. SLP Short Term Goal 2 (Week 1): Pt will selectively attend to tasks with Min A cues for redirection. SLP Short Term Goal 3 (Week 1): Pt will  demonstrate ability to problem solve mildly complex to complex situations with Min A cues. SLP Short Term Goal 4 (Week 1): Pt will detect fuctional errors with Min A cues. SLP Short Term Goal 5 (Week 1): Pt will recall 2 safety precautions and/or therapy techniques with Min A cues.  Refer to Care Plan for Long Term Goals  Recommendations for other services: None   Discharge Criteria: Patient will be discharged from SLP if patient refuses treatment 3 consecutive times without medical reason, if treatment goals not met, if there is a change in medical status, if patient makes no progress towards goals or if patient is discharged from hospital.  The above assessment, treatment plan, treatment alternatives and goals were discussed and mutually agreed upon: by patient  Arbutus Leas 04/26/2020, 9:27 AM

## 2020-04-26 NOTE — Progress Notes (Signed)
Physical Therapy Session Note  Patient Details  Name: Bobby Branch MRN: [id number] Date of Birth: 12-Feb-1960  Today's Date: 04/26/2020 PT Individual Time: 1315-1428 PT Individual Time Calculation (min): 73 min   Short Term Goals: Week 1:  PT Short Term Goal 1 (Week 1): Pt will perform bed mobility mod assist. PT Short Term Goal 2 (Week 1): Pt will perform bed to chair transfer mod assist. PT Short Term Goal 3 (Week 1): Pt will initiate gait training. PT Short Term Goal 4 (Week 1): Pt will perform sit to stand mod assist.  Skilled Therapeutic Interventions/Progress Updates:    Pt received sitting in WC eating lunch. Pt reported having L shoulder pain (unrated), but agreeable to therapy. Nurse entered room to administer oral medications and Lidocaine patch for L shld. Pt wheeled to therapy gym for time management. Hip maintains ER position; LLE physically re-positioned into neutral and foot re-placed to increase knee flexion to promote WB, and pt transferred to mat towards L squat pivot max assist with verbal cuing to lean towards R side into therapist and therapist facilitating LLE WB for NMR. STS max assist with verbal cuing to extend L knee and tuck hips into posterior pelvic tilt. No quad contraction felt during standing with minimal glute contraction. Pt leans heavily into therapist on L side and posteriorly at trunk, and tactile/verbal cuing to shift hips to the L and shoulders to the R. While in standing, verbally and tactile cuing on upper anterior trunk to "look up and bring shoulders back" to facilitate scapular retraction and thoracic extension for better standing posture, although pt demonstrates little-no ability to perform this motion independently. Therapist blocks L knee to prevent buckling and therapist hand used to cue posterior pelvic tilt and L hip ext. In standing, LLE NMR via "mini squats" performed to allow slight knee flexion upon descending, and facilitate quad activation on  concentric portion of movement to extend knee. 0% quad activation observed and therapist leg 100% still required to facilitate this motion. However, severe lateral patellar shift on LLE noted. Pt verbalized needing to use the bathroom and wheeled back to room. NT in room cleaning bed upon arrival but left shortly after. R squat pivot max assist transfer to toilet with same cues/techniques used previously in session. Pt LLE repositioned and transferred to standing max assist to doff pants/briefs and then sat back down. Therapist supervision for anterior trunk leaning to prevent falling on toilet. No BM produced, but bowel smearing present. Due to time constraint, pt transferred onto [facility] max assist, wheeled to bed, and seated onto EOB bed. Pt transferred back into supine max assist. Bed alarm turned on and needs within reach. Pt is quite hyperverbose and needs frequent re-orientiation to tasks throughout session, but is willing to do any interventions that therapist proposes. Pt would benefit from forward/upward reaching tasks in standing in the future and bridging.  Therapy Documentation Precautions:  Precautions Precautions: Fall,Other (comment) Precaution Comments: L hemiplegia, L inattention Restrictions Weight Bearing Restrictions: No    Therapy/Group: Individual Therapy  Eleonore Chiquito, SPT 04/26/2020, 2:33 PM

## 2020-04-26 NOTE — Progress Notes (Signed)
Occupational Therapy Session Note  Patient Details  Name: Bobby Branch MRN: [id number] Date of Birth: [birth year]  Today's Date: 04/26/2020 OT Individual Time: 1004-1105 OT Individual Time Calculation (min): 61 min    Short Term Goals: Week 1:  OT Short Term Goal 1 (Week 1): Pt will complete UB bathing sitting unsupported with min assist. OT Short Term Goal 2 (Week 1): Pt will donn a pullover shirt for two consecutive sessions with min assist. OT Short Term Goal 3 (Week 1): Pt will complete toilet transfer and toileting tasks with no more than mod assist. OT Short Term Goal 4 (Week 1): Pt will integrate the LUE into bathing tasks as a stabilizer with mod assist and mod instructional cueing. OT Short Term Goal 5 (Week 1): Pt will maintain sustained attention to selfcare tasks for 15 mins with no more than min instructional cueing for re-direction.  Skilled Therapeutic Interventions/Progress Updates:    Pt in bed to start session with completion of supine to sit EOB with max assist.  He was able to maintain static sitting balance EOB with supervision.  Had him work on donning new paper scrubs with mod assist to follow hemi technique for donning a pullover top with max assist to donn pull up pants.  Mod assist needed for dynamic sitting balance while working on dressing secondary to increased lean and LOB to the left.  Noted decreased tone in the LUE to start session compared to the end of yesterdays session, while working EOB.  Next, he completed stand pivot transfers to the wheelchair and then down in the gym to the therapy mat.  Therapist applied NMES to the dorsal left forearm to help facilitate decreased digit flexor tone and increase active movement.  Instructed pt to assist with extension along with stimulation.  He needed max instructional cueing to maintain sustained visual attention on the LUE while attempting to open the hand as he was easily distracted by noises and other people in the gym.   Decreased ability to maintain upright anterior pelvic tilt as well throughout and instead falling into posterior pelvic tilt.  He was able to tolerate 14 mins of active stimulation while also working on weightbearing through the LUE as well as PROM elbow extension for pre-reaching.  Intensity was placed at level 31 with rate at 35 PPS and 300 pulse width.  No report of pain during stimulation with not redness noted a conclusion.  Finished session with return to the wheelchair at max assist level for stand pivot transfer.  He was taken back to the room where he remained up in the wheelchair.  He was also educated on self PROM for shoulder flexion, elbow flexion/extension, and wrist and digit extension for the LUE.  Handout was also provided for reference but pt does not have his glasses with him currently.  He was left with the call button and phone in reach and safety alarm belt in place.    Therapy Documentation Precautions:  Precautions Precautions: Fall,Other (comment) Precaution Comments: L hemiplegia, L inattention Restrictions Weight Bearing Restrictions: No Pain: Pain Assessment Pain Scale: Faces Faces Pain Scale: Hurts a little bit Pain Type: Neuropathic pain Pain Location: Shoulder Pain Orientation: Left Pain Descriptors / Indicators: Discomfort Pain Onset: With Activity Pain Intervention(s): Repositioned ADL: See Care Tool Section for some details of mobility and selfcare  Therapy/Group: Individual Therapy  Keyshon Stein OTR/L 04/26/2020, 12:36 PM

## 2020-04-26 NOTE — Progress Notes (Signed)
PROGRESS NOTE   Subjective/Complaints: Left sided shoulder pain and increased tone on that side.  He would like his ashwaghanda that he uses at home.  He was hypoglycemic this morning- d/ced sliding scale.  He is working with [place] on cognitive task.   ROS- denies CP, SOB, N/V/D   Objective:   No results found. Recent Labs    04/24/20 0504  WBC 7.1  HGB 12.7*  HCT 37.3*  PLT 306   Recent Labs    04/24/20 0504  NA 138  K 3.5  CL 108  CO2 24  GLUCOSE 88  BUN 11  CREATININE 1.95*  CALCIUM 8.1*    Intake/Output Summary (Last 24 hours) at 04/26/2020 1135 Last data filed at 04/26/2020 0842 Gross per 24 hour  Intake 360 ml  Output 805 ml  Net -445 ml        Physical Exam: Vital Signs Blood pressure (!) 168/78, pulse 79, temperature 97.9 F (36.6 C), temperature source Oral, resp. rate 16, height '5\' 6"'$  (1.676 m), weight 49.2 kg, SpO2 100 %. Gen: no distress, normal appearing HEENT: oral mucosa pink and moist, NCAT Cardio: Reg rate Chest: normal effort, normal rate of breathing Abd: soft, non-distended Ext: no edema Psych: pleasant, normal affect Skin: intact Neurologic: Cranial nerves II through XII intact, motor strength is 5/5 in RIght and 0/5 left  deltoid, bicep, tricep, grip, hip flexor, knee extensors, ankle dorsiflexor and plantar flexor Sensory exam reduced  sensation to light touch in LUE Increase in left Finger flexor tone MAS 2 Musculoskeletal: Full range of motion in all 4 extremities. No joint swelling   Assessment/Plan: 1. Functional deficits which require 3+ hours per day of interdisciplinary therapy in a comprehensive inpatient rehab setting.  Physiatrist is providing close team supervision and 24 hour management of active medical problems listed below.  Physiatrist and rehab team continue to assess barriers to discharge/monitor patient progress toward functional and medical  goals  Care Tool:  Bathing    Body parts bathed by patient: Left arm,Chest,Abdomen,Right upper leg,Left upper leg,Face   Body parts bathed by helper: Front perineal area,Buttocks,Right arm,Right lower leg,Left lower leg     Bathing assist Assist Level: Maximal Assistance - Patient 24 - 49%     Upper Body Dressing/Undressing Upper body dressing   What is the patient wearing?: Pull over shirt    Upper body assist Assist Level: Maximal Assistance - Patient 25 - 49%    Lower Body Dressing/Undressing Lower body dressing      What is the patient wearing?: Incontinence brief     Lower body assist Assist for lower body dressing: Maximal Assistance - Patient 25 - 49%     Toileting Toileting    Toileting assist Assist for toileting: Moderate Assistance - Patient 50 - 74%     Transfers Chair/bed transfer  Transfers assist     Chair/bed transfer assist level: Maximal Assistance - Patient 25 - 49%     Locomotion Ambulation   Ambulation assist   Ambulation activity did not occur: Safety/medical concerns          Walk 10 feet activity   Assist  Walk 10 feet activity  did not occur: Safety/medical concerns        Walk 50 feet activity   Assist Walk 50 feet with 2 turns activity did not occur: Safety/medical concerns         Walk 150 feet activity   Assist Walk 150 feet activity did not occur: Safety/medical concerns         Walk 10 feet on uneven surface  activity   Assist Walk 10 feet on uneven surfaces activity did not occur: Safety/medical concerns         Wheelchair     Assist Will patient use wheelchair at discharge?:  (TBD)             Wheelchair 50 feet with 2 turns activity    Assist            Wheelchair 150 feet activity     Assist          Blood pressure (!) 168/78, pulse 79, temperature 97.9 F (36.6 C), temperature source Oral, resp. rate 16, height '5\' 6"'$  (1.676 m), weight 49.2 kg, SpO2 100  %. Medical Problem List and Plan: 1.  Left-sided weakness secondary to right MCA distribution infarction with right M2/3 occlusion status post thrombectomy revascularization as well as history of CVA August 2021 with very mild residual deficits             -patient may shower             -ELOS/Goals: minA in 10-14 days Order WHO/PRAFO   -Continue CIR 2.  Impaired mobility: -DVT/anticoagulation: Continue SCDs             -antiplatelet therapy: Continue Aspirin 325 mg daily and Plavix 75 mg daily x3 months 3. Left shoulder pain: Tylenol as needed. Add voltaren gel and lidocaine patch.  4. Depression: Continue Lexapro 10 mg daily             -antipsychotic agents: N/A 5. Neuropsych: This patient is capable of making decisions on his own behalf. 6. Skin/Wound Care: Routine skin checks 7. Fluids/Electrolytes/Nutrition: Routine in and outs with follow-up chemistries 8.  Hypertension.  BP has been labile- continue Norvasc 10 mg daily.  Monitor with increased mobility 9.  Diabetes mellitus.  Hemoglobin A1c 7.3.  SSI.  Patient on Glucophage 500 mg twice daily prior to admission.  Resume as needed CBG (last 3)  Recent Labs    04/26/20 0014 04/26/20 0441 04/26/20 0814  GLUCAP 178* 188* 105*   3/24: hypoglycemic: d/c ISS 10.  Hyperlipidemia.  Lipitor 11.  Polysubstance abuse.  Urine drug screen positive cocaine as well as marijuana.  ask neuropsych to provide  Counseling, pt plans to attend NA as OP  Tobacco abuse add nicoderm patch 106mg 12.  CKD stage III.  BMET stable  13. Anemia: trending upward, continue to monitor.  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 Scheduled at 11pm as per patient's request.     LOS: 3 days A FACE TO FACE EVALUATION WAS PERFORMED  KClide DeutscherRaulkar 04/26/2020, 11:35 AM

## 2020-04-26 NOTE — Plan of Care (Signed)
  Problem: Consults Goal: RH STROKE PATIENT EDUCATION Description: See Patient Education module for education specifics  Outcome: Progressing   Problem: RH BOWEL ELIMINATION Goal: RH STG MANAGE BOWEL WITH ASSISTANCE Description: STG Manage Bowel with Mod I Assistance. Outcome: Progressing Goal: RH STG MANAGE BOWEL W/MEDICATION W/ASSISTANCE Description: STG Manage Bowel with Medication with Mod I Assistance. Outcome: Progressing   Problem: RH BLADDER ELIMINATION Goal: RH STG MANAGE BLADDER WITH ASSISTANCE Description: STG Manage Bladder With Mod I Assistance Outcome: Progressing   Problem: RH SAFETY Goal: RH STG ADHERE TO SAFETY PRECAUTIONS W/ASSISTANCE/DEVICE Description: STG Adhere to Safety Precautions With mod I Assistance/Device. Outcome: Progressing Goal: RH STG DECREASED RISK OF FALL WITH ASSISTANCE Description: STG Decreased Risk of Fall With Mod I Assistance. Outcome: Progressing   Problem: RH COGNITION-NURSING Goal: RH STG USES MEMORY AIDS/STRATEGIES W/ASSIST TO PROBLEM SOLVE Description: STG Uses Memory Aids/Strategies With cues/reminders Assistance to Problem Solve. Outcome: Progressing Goal: RH STG ANTICIPATES NEEDS/CALLS FOR ASSIST W/ASSIST/CUES Description: STG Anticipates Needs/Calls for Assist With Assistance/Cues. Outcome: Progressing   Problem: RH KNOWLEDGE DEFICIT Goal: RH STG INCREASE KNOWLEDGE OF HYPERTENSION Description: Patient and girlfriend will be able to manage secondary stroke risks including HTN, HLD , DM with medications and dietary modifications and stroke prophylaxis using handouts and educational materials independently   Outcome: Progressing

## 2020-04-27 ENCOUNTER — Encounter (HOSPITAL_COMMUNITY): Payer: Self-pay | Admitting: Physical Medicine & Rehabilitation

## 2020-04-27 LAB — GLUCOSE, CAPILLARY
Glucose-Capillary: 178 mg/dL — ABNORMAL HIGH (ref 70–99)
Glucose-Capillary: 111 mg/dL — ABNORMAL HIGH (ref 70–99)
Glucose-Capillary: 196 mg/dL — ABNORMAL HIGH (ref 70–99)

## 2020-04-27 LAB — HOMOCYSTEINE: Homocysteine: 16.8 umol/L — ABNORMAL HIGH (ref 0.0–14.5)

## 2020-04-27 NOTE — Progress Notes (Signed)
PROGRESS NOTE   Subjective/Complaints: Does appear groggier this morning- may be due to Tizanidine at night. He did sleep better.  He has no other complaints.   ROS- denies CP, SOB, N/V/D, +spasticity   Objective:   No results found. No results for input(s): WBC, HGB, HCT, PLT in the last 72 hours. No results for input(s): NA, K, CL, CO2, GLUCOSE, BUN, CREATININE, CALCIUM in the last 72 hours.  Intake/Output Summary (Last 24 hours) at 04/27/2020 1001 Last data filed at 04/27/2020 0700 Gross per 24 hour  Intake 360 ml  Output 1675 ml  Net -1315 ml        Physical Exam: Vital Signs Blood pressure 137/83, pulse 66, temperature 97.9 F (36.6 C), resp. rate 19, height '5\' 6"'$  (1.676 m), weight 49.2 kg, SpO2 98 %. Gen: no distress, normal appearing HEENT: oral mucosa pink and moist, NCAT Cardio: Reg rate Chest: normal effort, normal rate of breathing Abd: soft, non-distended Ext: no edema Psych: pleasant, normal affect Skin: intact Neurologic: Cranial nerves II through XII intact, motor strength is 5/5 in RIght and 0/5 left  deltoid, bicep, tricep, grip, hip flexor, knee extensors, ankle dorsiflexor and plantar flexor Sensory exam reduced  sensation to light touch in LUE Increase in left Finger flexor tone MAS 2 Musculoskeletal: Full range of motion in all 4 extremities. No joint swelling   Assessment/Plan: 1. Functional deficits which require 3+ hours per day of interdisciplinary therapy in a comprehensive inpatient rehab setting.  Physiatrist is providing close team supervision and 24 hour management of active medical problems listed below.  Physiatrist and rehab team continue to assess barriers to discharge/monitor patient progress toward functional and medical goals  Care Tool:  Bathing    Body parts bathed by patient: Left arm,Chest,Abdomen,Right upper leg,Left upper leg,Face   Body parts bathed by helper:  Front perineal area,Buttocks,Right arm,Right lower leg,Left lower leg     Bathing assist Assist Level: Maximal Assistance - Patient 24 - 49%     Upper Body Dressing/Undressing Upper body dressing   What is the patient wearing?: Pull over shirt    Upper body assist Assist Level: Moderate Assistance - Patient 50 - 74%    Lower Body Dressing/Undressing Lower body dressing      What is the patient wearing?: Pants     Lower body assist Assist for lower body dressing: Maximal Assistance - Patient 25 - 49%     Toileting Toileting    Toileting assist Assist for toileting: Moderate Assistance - Patient 50 - 74%     Transfers Chair/bed transfer  Transfers assist     Chair/bed transfer assist level: Maximal Assistance - Patient 25 - 49%     Locomotion Ambulation   Ambulation assist   Ambulation activity did not occur: Safety/medical concerns          Walk 10 feet activity   Assist  Walk 10 feet activity did not occur: Safety/medical concerns        Walk 50 feet activity   Assist Walk 50 feet with 2 turns activity did not occur: Safety/medical concerns         Walk 150 feet activity   Assist  Walk 150 feet activity did not occur: Safety/medical concerns         Walk 10 feet on uneven surface  activity   Assist Walk 10 feet on uneven surfaces activity did not occur: Safety/medical concerns         Wheelchair     Assist Will patient use wheelchair at discharge?:  (TBD)             Wheelchair 50 feet with 2 turns activity    Assist            Wheelchair 150 feet activity     Assist          Blood pressure 137/83, pulse 66, temperature 97.9 F (36.6 C), resp. rate 19, height '5\' 6"'$  (1.676 m), weight 49.2 kg, SpO2 98 %. Medical Problem List and Plan: 1.  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  Impaired mobility: -DVT/anticoagulation: Conitnue SCDs             -antiplatelet therapy: Continue Aspirin 325 mg daily and Plavix 75 mg daily x3 months 3. Left shoulder pain: Tylenol as needed. Add voltaren gel and lidocaine patch.  4. Depression: Continue Lexapro 10 mg daily             -antipsychotic agents: N/A 5. Neuropsych: This patient is capable of making decisions on his own behalf. 6. Skin/Wound Care: Routine skin checks 7. Fluids/Electrolytes/Nutrition: Routine in and outs with follow-up chemistries 8.  Hypertension.  BP has been labile- continue Norvasc 10 mg daily.  Monitor with increased mobility  3/25: BP stable with addition of tizanidine 9.  Diabetes mellitus.  Hemoglobin A1c 7.3.  SSI.  Patient on Glucophage 500 mg twice daily prior to admission.  Resume as needed CBG (last 3)  Recent Labs    04/26/20 1643 04/26/20 1938 04/26/20 2355  GLUCAP 165* 178* 159*   3/24: hypoglycemic: d/c ISS 10.  Hyperlipidemia.  Lipitor 11.  Polysubstance abuse.  Urine drug screen positive cocaine as well as marijuana.  ask neuropsych to provide  Counseling, pt plans to attend [facility] as OP  Tobacco abuse add nicoderm patch 39mg 12.  CKD stage III.  BMET stable  13. Anemia: trending upward, continue to monitor.  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 Scheduled at 11pm as per patient's request. Will not increase during day due to grogginess this morning.     LOS: 4 days A FACE TO FACE EVALUATION WAS PERFORMED  Ranya Fiddler P Ashish Rossetti 04/27/2020, 10:01 AM

## 2020-04-27 NOTE — Progress Notes (Signed)
Occupational Therapy Session Note  Patient Details  Name: Bobby Branch MRN: [id number] Date of Birth: 06-09-1960  Today's Date: 04/27/2020 OT Individual Time: 1405-1505 OT Individual Time Calculation (min): 60 min    Short Term Goals: Week 1:  OT Short Term Goal 1 (Week 1): Pt will complete UB bathing sitting unsupported with min assist. OT Short Term Goal 2 (Week 1): Pt will donn a pullover shirt for two consecutive sessions with min assist. OT Short Term Goal 3 (Week 1): Pt will complete toilet transfer and toileting tasks with no more than mod assist. OT Short Term Goal 4 (Week 1): Pt will integrate the LUE into bathing tasks as a stabilizer with mod assist and mod instructional cueing. OT Short Term Goal 5 (Week 1): Pt will maintain sustained attention to selfcare tasks for 15 mins with no more than min instructional cueing for re-direction.  Skilled Therapeutic Interventions/Progress Updates:    Pt completed supine to sit EOB with max assist and then stand pivot transfer to the wheelchair as well.  Took him down to the therapy gym where he transferred to the therapy mat at the same level.  Worked on neck stretches to left upper trap and levator secondary to increased pain in the area.  Also performed scapular mobilizations for adduction, elevation, and depression.  Positioned LUE in elbow extension and weightbearing beside of him on the mat with work on dynamic balance and activation of elbow extensors.  Had him work on reaching to targets to the left and right with use of the RUE to increase weightbearing over the LLE and LUE. Worked on transitions from sit to squat as well with mod assist.  Pt completed standing with weight shifts in from midline to the right with total +2 (pt 30%).  Max facilitation was needed for advancement of the LLE with pre-gait as well as ambulation approximately 58f with overall total +2 (pt 30%).  Finished session with return to the room via wheelchair and transfer  to the bed with max assist stand pivot.  Call button and phone in reach with LUE placed in resting hand splint.  Safety alarm on as well.    Therapy Documentation Precautions:  Precautions Precautions: Fall,Other (comment) Precaution Comments: L hemiplegia, L inattention Restrictions Weight Bearing Restrictions: No  Pain: Pain Assessment Pain Scale: Faces Faces Pain Scale: Hurts a little bit Pain Type: Neuropathic pain Pain Location: Shoulder Pain Orientation: Left Pain Descriptors / Indicators: Discomfort Pain Onset: With Activity Pain Intervention(s): Repositioned ADL: See Care Tool Section for some details of mobility and selfcare Other Treatments:     Therapy/Group: Individual Therapy  Reena Borromeo OTR/L 04/27/2020, 4:31 PM

## 2020-04-27 NOTE — Progress Notes (Signed)
Physical Therapy Session Note  Patient Details  Name: Bobby Branch MRN: [id number] Date of Birth: 06/08/1960  Today's Date: 04/27/2020 PT Individual Time: 1100-1200 PT Individual Time Calculation (min): 60 min   Short Term Goals: Week 1:  PT Short Term Goal 1 (Week 1): Pt will perform bed mobility mod assist. PT Short Term Goal 2 (Week 1): Pt will perform bed to chair transfer mod assist. PT Short Term Goal 3 (Week 1): Pt will initiate gait training. PT Short Term Goal 4 (Week 1): Pt will perform sit to stand mod assist.  Skilled Therapeutic Interventions/Progress Updates:    Patient received in bed, RN present, attempting to use urinal. He was agreeable to PT and denied pain. Patient spilled urinal contents requiring MaxA for perihygiene/clothing management and multiple bouts of rolling with CGA to the L and ModA to the R. Grossly MaxA for dressing at bedlevel. He was able to come to sit edge of bed with MaxA. Poor dynamic sitting balance noted requiring consistent MinA/ModA to maintain midline. MaxA squat pivot to wc. PT transported patient in wc to therapy gym for time management and energy conservation. Pre-gait exercises completed in standing with ModA x2 to come to stand, MaxA to block L knee/facilitate weight shift L to allow patient to advance R LE to target. Mirror used for visual feedback with horizontal line to cue patient to maintain erect trunk so that head was above line. Patient with significant difficulty with adequate weight shift L due to flexor withdrawal reflex resulting in limited and uncoordinated advancement of R LE. Patient also required Mod verbal cues to maintain erect trunk with tendency toward significant flexion, despite visual feedback as well as facilitation at posterior hips. Very tight L hamstring likely also contributing to overall poor postural control. PT with limited ability to provide prolonged stretch to hamstrings due to flexor withdrawal. At end of session,  patient returning to room in wc, seatbelt alarm on, call light within reach.   Therapy Documentation Precautions:  Precautions Precautions: Fall,Other (comment) Precaution Comments: L hemiplegia, L inattention Restrictions Weight Bearing Restrictions: No    Therapy/Group: Individual Therapy  Karoline Caldwell, PT, DPT, CBIS  04/27/2020, 7:47 AM

## 2020-04-27 NOTE — Progress Notes (Signed)
Pt agitated and yelling on the phone

## 2020-04-27 NOTE — Progress Notes (Signed)
Speech Language Pathology Daily Session Note  Patient Details  Name: Bobby Branch MRN: [id number] Date of Birth: 12/21/1960  Today's Date: 04/27/2020 SLP Individual Time: [id number] SLP Individual Time Calculation (min): 56 min  Short Term Goals: Week 1: SLP Short Term Goal 1 (Week 1): Pt will demonstate ability to use compensatory memory strategies for semi-complex tasks/recall with Min A verbal/visual cues. SLP Short Term Goal 2 (Week 1): Pt will selectively attend to tasks with Min A cues for redirection. SLP Short Term Goal 3 (Week 1): Pt will demonstrate ability to problem solve mildly complex to complex situations with Min A cues. SLP Short Term Goal 4 (Week 1): Pt will detect fuctional errors with Min A cues. SLP Short Term Goal 5 (Week 1): Pt will recall 2 safety precautions and/or therapy techniques with Min A cues.  Skilled Therapeutic Interventions: Pt was seen for skilled ST targeting cognitive goals. SLP facilitated session with a complex medication management task. Pt verbally recalled ~50% of medication functions, and with Min A verbal cues able to identify functions of remaining current meds. Although only taking 1X and BID medications here, he reports he prefers to use QID pill box like his at home, because he also takes some non-FDA approved herbs/supplements that he plans to continue to take upon d/c home. Therefore, provided QID pill box for pt to organize according to his list. He required Min A verbal and visual cues for problem solving, and Mod A verbal cues for awareness of errors when organizing the pill box. Pt was distractible, requiring Mod A verbal cues for redirection during session (mostly due to internal distractions). Pt did not full complete medication management task due to time constraints and distractibility, therefore, would recommend completing at next available session. Pt left laying in bed with alarm set and needs within reach. Continue per current plan of  care.          Pain Pain Assessment Pain Scale: Faces Faces Pain Scale: No hurt  Therapy/Group: Individual Therapy  Arbutus Leas 04/27/2020, 7:26 AM

## 2020-04-28 LAB — GLUCOSE, CAPILLARY
Glucose-Capillary: 111 mg/dL — ABNORMAL HIGH (ref 70–99)
Glucose-Capillary: 148 mg/dL — ABNORMAL HIGH (ref 70–99)
Glucose-Capillary: 193 mg/dL — ABNORMAL HIGH (ref 70–99)
Glucose-Capillary: 193 mg/dL — ABNORMAL HIGH (ref 70–99)
Glucose-Capillary: 274 mg/dL — ABNORMAL HIGH (ref 70–99)

## 2020-04-28 NOTE — Progress Notes (Addendum)
Pt refused to have blood sugar checked at midnight. RN educated pt on monitoring blood sugar. RN will recheck blood sugar at 6 am. Pt stable at this time.

## 2020-04-28 NOTE — Progress Notes (Signed)
Physical Therapy Session Note  Patient Details  Name: Bobby Branch MRN: [id number] Date of Birth: 03/26/1960  Today's Date: 04/28/2020 PT Missed Time: 56 Minutes Missed Time Reason: Patient unwilling to participate;Patient fatigue  Short Term Goals: Week 1:  PT Short Term Goal 1 (Week 1): Pt will perform bed mobility mod assist. PT Short Term Goal 2 (Week 1): Pt will perform bed to chair transfer mod assist. PT Short Term Goal 3 (Week 1): Pt will initiate gait training. PT Short Term Goal 4 (Week 1): Pt will perform sit to stand mod assist.  Skilled Therapeutic Interventions/Progress Updates:    Patient received supine in bed, asleep. PT attempting to encourage patient to participate in therapy, however patient repeatedly stated that he was too tired to do so. PT educating patient on importance of participating in therapy to continue to progress toward LTGs and ensure safe dc home. However, patient began to pull blanket over his face. PT unable to redirect patient to participate in therapy. Bed alarm on, call light within reach.   Therapy Documentation Precautions:  Precautions Precautions: Fall,Other (comment) Precaution Comments: L hemiplegia, L inattention Restrictions Weight Bearing Restrictions: No    Therapy/Group: Individual Therapy  Karoline Caldwell, PT, DPT, CBIS  04/28/2020, 7:36 AM

## 2020-04-28 NOTE — Progress Notes (Signed)
Patient stated no bowel movement since 04/23/2020. Patient receives polyethylene glycol and colace daily. Patient was given prune juice today but did not drink it all because of the taste. Patient would like to get an enema tomorrow morning if he does not have a bowel movement tonight. Order has been placed per verbal doctor order.

## 2020-04-28 NOTE — Progress Notes (Addendum)
Pt refused SCD a d PRAFO boot last night. RN provided education. Pt stable st this time.

## 2020-04-28 NOTE — Progress Notes (Addendum)
Occupational Therapy Session Note  Patient Details  Name: Bobby Branch MRN: [id number] Date of Birth: 09/08/1960  Today's Date: 04/28/2020 OT Individual Time: [id number] OT Individual Time Calculation (min): 56 min    Short Term Goals: Week 1:  OT Short Term Goal 1 (Week 1): Pt will complete UB bathing sitting unsupported with min assist. OT Short Term Goal 2 (Week 1): Pt will donn a pullover shirt for two consecutive sessions with min assist. OT Short Term Goal 3 (Week 1): Pt will complete toilet transfer and toileting tasks with no more than mod assist. OT Short Term Goal 4 (Week 1): Pt will integrate the LUE into bathing tasks as a stabilizer with mod assist and mod instructional cueing. OT Short Term Goal 5 (Week 1): Pt will maintain sustained attention to selfcare tasks for 15 mins with no more than min instructional cueing for re-direction.  Skilled Therapeutic Interventions/Progress Updates:    Pt in bed to start session with plans to work on shower and dressing this session.  Max assist for supine to sit on the left side of the bed with max assist for stand pivot transfer to the wheelchair.  He was then able to complete stand pivot transfer to the tub bench at the same level.  Max assist needed for advancement of the LLE during transfer as well as for maintaining left knee extension in weightbearing.  Mod assist overall needed for overall balance while bathing.  Pt with increased lean to the left and occasional LOB requiring therapist assist or use of the grab bar with his non-involved RUE to correct.  Mod assist overall for bathing sit to stand with max hand over hand assist needed for integration of the LUE for washing the RUE.  Max instructional cueing also needed to maintain selective attention as he would stop completing bathing and dressing tasks when talking and needed cueing to continue.  He worked on [employer] dressing at [employer] level and LB dressing at max assist level following hemi  dressing techniques.  Finished session with LUE positioned on half lap tray and pt sitting up with SLP in for next session.  Encouraged pt to continue wearing resting hand splint at night as finger flexion tone continues to increase as well as continuing PROM exercises given a couple of treatments ago.      Therapy Documentation Precautions:  Precautions Precautions: Fall,Other (comment) Precaution Comments: L hemiplegia, L inattention Restrictions Weight Bearing Restrictions: No  Pain: Pain Assessment Pain Scale: Faces Pain Score: 2  Faces Pain Scale: Hurts a little bit Pain Type: Neuropathic pain Pain Location: Shoulder Pain Orientation: Left Pain Descriptors / Indicators: Discomfort Pain Onset: With Activity Patients Stated Pain Goal: 1 Pain Intervention(s): Repositioned Multiple Pain Sites: Yes 2nd Pain Site Pain Score: 4 Pain Type: Acute pain Pain Location: Shoulder Pain Orientation: Left Pain Descriptors / Indicators: Aching Patient's Stated Pain Goal: 2 Pain Intervention(s): Medication (See eMAR) ADL: See Care Tool Section for some details of mobility and selfcare Other Treatments:     Therapy/Group: Individual Therapy  Shauntavia Brackin OTR/L 04/28/2020, 12:33 PM

## 2020-04-28 NOTE — Progress Notes (Signed)
Speech Language Pathology Daily Session Note  Patient Details  Name: Nuriel Dimuzio MRN: [id number] Date of Birth: 1960-02-06  Today's Date: 04/28/2020 SLP Individual Time: 1100-1159 SLP Individual Time Calculation (min): 59 min  Short Term Goals: Week 1: SLP Short Term Goal 1 (Week 1): Pt will demonstate ability to use compensatory memory strategies for semi-complex tasks/recall with Min A verbal/visual cues. SLP Short Term Goal 2 (Week 1): Pt will selectively attend to tasks with Min A cues for redirection. SLP Short Term Goal 3 (Week 1): Pt will demonstrate ability to problem solve mildly complex to complex situations with Min A cues. SLP Short Term Goal 4 (Week 1): Pt will detect fuctional errors with Min A cues. SLP Short Term Goal 5 (Week 1): Pt will recall 2 safety precautions and/or therapy techniques with Min A cues.  Skilled Therapeutic Interventions:Skilled ST services focused on cognitive skills. SLP facilitated medication management with familiar QID pill organizer. The task was interrupted by multiple phone calls, in which the pt intermittently answered. Pt initially required mod A verbal cues for error awareness when pt was attempting to converse and complete task, however after strict instructions to cease conversation (limiting internal distractions) pt completed the remaining 30% of the task mod I for error awareness and problem solving. SLP educated pt to allow nephew to assist with medication management and to limit all distractions, pt agreed. Pt was left in room with call bell within reach and chair alarm set. SLP recommends to continue skilled services.     Pain Pain Assessment Pain Scale: Faces Pain Score: 0-No pain Faces Pain Scale: Hurts a little bit Pain Type: Neuropathic pain Pain Location: Shoulder Pain Orientation: Left Pain Descriptors / Indicators: Discomfort Pain Onset: With Activity Patients Stated Pain Goal: 1 Pain Intervention(s): Repositioned Multiple  Pain Sites: Yes 2nd Pain Site Pain Score: 4 Pain Type: Acute pain Pain Location: Shoulder Pain Orientation: Left Pain Descriptors / Indicators: Aching Patient's Stated Pain Goal: 2 Pain Intervention(s): Medication (See eMAR)  Therapy/Group: Individual Therapy  Sahan Pen  [facility] 04/28/2020, 12:48 PM

## 2020-04-29 DIAGNOSIS — E1165 Type 2 diabetes mellitus with hyperglycemia: Secondary | ICD-10-CM

## 2020-04-29 DIAGNOSIS — G8110 Spastic hemiplegia affecting unspecified side: Secondary | ICD-10-CM

## 2020-04-29 DIAGNOSIS — K5901 Slow transit constipation: Secondary | ICD-10-CM

## 2020-04-29 DIAGNOSIS — N1832 Chronic kidney disease, stage 3b: Secondary | ICD-10-CM

## 2020-04-29 DIAGNOSIS — I10 Essential (primary) hypertension: Secondary | ICD-10-CM

## 2020-04-29 LAB — GLUCOSE, CAPILLARY
Glucose-Capillary: 131 mg/dL — ABNORMAL HIGH (ref 70–99)
Glucose-Capillary: 133 mg/dL — ABNORMAL HIGH (ref 70–99)
Glucose-Capillary: 174 mg/dL — ABNORMAL HIGH (ref 70–99)
Glucose-Capillary: 186 mg/dL — ABNORMAL HIGH (ref 70–99)
Glucose-Capillary: 189 mg/dL — ABNORMAL HIGH (ref 70–99)

## 2020-04-29 MED ORDER — SENNOSIDES-DOCUSATE SODIUM 8.6-50 MG PO TABS
2.0000 | ORAL_TABLET | Freq: Two times a day (BID) | ORAL | Status: DC
  Administered 2020-04-29 – 2020-05-03 (×10): 2 via ORAL
  Filled 2020-04-29 (×11): qty 2

## 2020-04-29 NOTE — Progress Notes (Signed)
Pt refused SCD. Education given. Pt wore PRAFO boot and splint. Pt refused enema and requested to get in later on during the day.

## 2020-04-29 NOTE — Progress Notes (Signed)
PROGRESS NOTE   Subjective/Complaints: Patient seen laying in bed this morning.  He states he slept fairly overnight, no particular reason why.  His left upper extremity is noted to be well positioned, encourage patient to don bracing.  ROS: Denies CP, SOB, N/V/D  Objective:   No results found. No results for input(s): WBC, HGB, HCT, PLT in the last 72 hours. No results for input(s): NA, K, CL, CO2, GLUCOSE, BUN, CREATININE, CALCIUM in the last 72 hours.  Intake/Output Summary (Last 24 hours) at 04/29/2020 0757 Last data filed at 04/29/2020 0416 Gross per 24 hour  Intake 1075 ml  Output 950 ml  Net 125 ml        Physical Exam: Vital Signs Blood pressure (!) 143/81, pulse 65, temperature 98 F (36.7 C), resp. rate 18, height '5\' 6"'$  (1.676 m), weight 49.2 kg, SpO2 94 %. Constitutional: No distress . Vital signs reviewed. HENT: Normocephalic.  Atraumatic. Eyes: EOMI. No discharge. Cardiovascular: No JVD.  RRR. Respiratory: Normal effort.  No stridor.  Bilateral clear to auscultation. GI: Non-distended.  BS +. Skin: Warm and dry.  Intact. Psych: Normal mood.  Normal behavior. Musc: No edema in extremities.  No tenderness in extremities. Neuro: Alert Motor:  RUE/RLE: 5/5 proximal distal  LUE/LLE: 0/5 proximal distal Increased tone noted in left upper extremity   Assessment/Plan: 1. Functional deficits which require 3+ hours per day of interdisciplinary therapy in a comprehensive inpatient rehab setting.  Physiatrist is providing close team supervision and 24 hour management of active medical problems listed below.  Physiatrist and rehab team continue to assess barriers to discharge/monitor patient progress toward functional and medical goals  Care Tool:  Bathing    Body parts bathed by patient: Left arm,Chest,Abdomen,Right upper leg,Left upper leg,Face,Front perineal area,Right lower leg   Body parts bathed by  helper: Left lower leg,Right arm,Buttocks     Bathing assist Assist Level: Moderate Assistance - Patient 50 - 74%     Upper Body Dressing/Undressing Upper body dressing   What is the patient wearing?: Pull over shirt    Upper body assist Assist Level: Moderate Assistance - Patient 50 - 74%    Lower Body Dressing/Undressing Lower body dressing      What is the patient wearing?: Pants     Lower body assist Assist for lower body dressing: Maximal Assistance - Patient 25 - 49%     Toileting Toileting    Toileting assist Assist for toileting: Moderate Assistance - Patient 50 - 74%     Transfers Chair/bed transfer  Transfers assist     Chair/bed transfer assist level: Maximal Assistance - Patient 25 - 49%     Locomotion Ambulation   Ambulation assist   Ambulation activity did not occur: Safety/medical concerns          Walk 10 feet activity   Assist  Walk 10 feet activity did not occur: Safety/medical concerns        Walk 50 feet activity   Assist Walk 50 feet with 2 turns activity did not occur: Safety/medical concerns         Walk 150 feet activity   Assist Walk 150 feet activity did not  occur: Safety/medical concerns         Walk 10 feet on uneven surface  activity   Assist Walk 10 feet on uneven surfaces activity did not occur: Safety/medical concerns         Wheelchair     Assist Will patient use wheelchair at discharge?: Yes (Per PT long term goals) Type of Wheelchair: Manual           Wheelchair 50 feet with 2 turns activity    Assist    Wheelchair 50 feet with 2 turns activity did not occur: Safety/medical concerns       Wheelchair 150 feet activity     Assist  Wheelchair 150 feet activity did not occur: Safety/medical concerns       Blood pressure (!) 143/81, pulse 65, temperature 98 F (36.7 C), resp. rate 18, height '5\' 6"'$  (1.676 m), weight 49.2 kg, SpO2 94 %. Medical Problem List and  Plan: 1.  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  Impaired mobility: -DVT/anticoagulation: Conitnue SCDs             -antiplatelet therapy: Continue Aspirin 325 mg daily and Plavix 75 mg daily x3 months 3. Left shoulder pain: Tylenol as needed. Add voltaren gel and lidocaine patch.  4. Depression: Continue Lexapro 10 mg daily             -antipsychotic agents: N/A 5. Neuropsych: This patient is capable of making decisions on his own behalf. 6. Skin/Wound Care: Routine skin checks 7. Fluids/Electrolytes/Nutrition: Routine in and outs 8.  Hypertension.  BP has been labile- continue Norvasc 10 mg daily.  Monitor with increased mobility  Relatively controlled on 3/27 9.  Diabetes mellitus with hyperglycemia.  Hemoglobin A1c 7.3.  SSI.  Patient on Glucophage 500 mg twice daily prior to admission.  Resume as needed CBG (last 3)  Recent Labs    04/28/20 2113 04/28/20 2358 04/29/20 0415  GLUCAP 193* 274* 133*   Will consider restarting Metformin if kidney function improves  Labile on 3/27 10.  Hyperlipidemia: Lipitor 11.  Polysubstance abuse.  Urine drug screen positive cocaine as well as marijuana.  ask neuropsych to provide  Counseling, pt plans to attend NA as OP   Tobacco abuse added nicoderm patch 58mg 12.  CKD stage III.    Creatinine 1.95 on 3/22, labs ordered for tomorrow 13. 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 Scheduled at 11pm as per patient's request. Will not increase during day due to grogginess this morning.  15.  Slow transit constipation  Bowel meds increased on 3/27   LOS: 6 days A FACE TO FACE EVALUATION WAS PERFORMED  Ankit ALorie Phenix3/27/2022, 7:57 AM

## 2020-04-30 LAB — BASIC METABOLIC PANEL
Anion gap: 3 — ABNORMAL LOW (ref 5–15)
BUN: 23 mg/dL — ABNORMAL HIGH (ref 6–20)
CO2: 34 mmol/L — ABNORMAL HIGH (ref 22–32)
Calcium: 9.1 mg/dL (ref 8.9–10.3)
Chloride: 100 mmol/L (ref 98–111)
Creatinine, Ser: 2.44 mg/dL — ABNORMAL HIGH (ref 0.61–1.24)
GFR, Estimated: 30 mL/min — ABNORMAL LOW (ref >60)
Glucose, Bld: 162 mg/dL — ABNORMAL HIGH (ref 70–99)
Potassium: 4.5 mmol/L (ref 3.5–5.1)
Sodium: 137 mmol/L (ref 135–145)

## 2020-04-30 LAB — GLUCOSE, CAPILLARY
Glucose-Capillary: 135 mg/dL — ABNORMAL HIGH (ref 70–99)
Glucose-Capillary: 146 mg/dL — ABNORMAL HIGH (ref 70–99)
Glucose-Capillary: 148 mg/dL — ABNORMAL HIGH (ref 70–99)
Glucose-Capillary: 178 mg/dL — ABNORMAL HIGH (ref 70–99)
Glucose-Capillary: 189 mg/dL — ABNORMAL HIGH (ref 70–99)
Glucose-Capillary: 191 mg/dL — ABNORMAL HIGH (ref 70–99)

## 2020-04-30 MED ORDER — ALBUTEROL SULFATE HFA 108 (90 BASE) MCG/ACT IN AERS
1.0000 | INHALATION_SPRAY | Freq: Four times a day (QID) | RESPIRATORY_TRACT | Status: DC | PRN
  Administered 2020-05-02 – 2020-05-18 (×3): 1 via RESPIRATORY_TRACT
  Filled 2020-04-30 (×2): qty 6.7

## 2020-04-30 NOTE — Progress Notes (Signed)
Speech Language Pathology Daily Session Note  Patient Details  Name: Bobby Branch MRN: [id number] Date of Birth: 11/18/1960  Today's Date: 04/30/2020 SLP Individual Time: [id number] SLP Individual Time Calculation (min): 43 min  Short Term Goals: Week 1: SLP Short Term Goal 1 (Week 1): Pt will demonstate ability to use compensatory memory strategies for semi-complex tasks/recall with Min A verbal/visual cues. SLP Short Term Goal 2 (Week 1): Pt will selectively attend to tasks with Min A cues for redirection. SLP Short Term Goal 3 (Week 1): Pt will demonstrate ability to problem solve mildly complex to complex situations with Min A cues. SLP Short Term Goal 4 (Week 1): Pt will detect fuctional errors with Min A cues. SLP Short Term Goal 5 (Week 1): Pt will recall 2 safety precautions and/or therapy techniques with Min A cues.  Skilled Therapeutic Interventions: Skilled SLP intervention focused on cognition. Mod A with visual and verbal cues required for account balance task. SLP directed patient and segmented steps to increase understanding and accuracy. Pt conversed with SLP regarding herbs he takes. Moderate verbal cues required to problem solve and reason importance of medications prescribed by MD. Pt recalled 2 tasks completed in PT session and demonstrated good awareness of physical improvements since previous sessions. Pt left seated upright in wheelchair with chair alarm set and call bell with reach. Cont with therapy per plan of care.      Pain Pain Assessment Pain Scale: Faces Pain Score: 0-No pain Faces Pain Scale: No hurt  Therapy/Group: Individual Therapy  Darrol Poke Lashante Fryberger 04/30/2020, 11:48 AM

## 2020-04-30 NOTE — Progress Notes (Signed)
Occupational Therapy Session Note  Patient Details  Name: Bobby Branch MRN: [id number] Date of Birth: Dec 23, 1960  Today's Date: 04/30/2020 OT Individual Time: 1345-1430 OT Individual Time Calculation (min): 45 min    Short Term Goals: Week 1:  OT Short Term Goal 1 (Week 1): Pt will complete UB bathing sitting unsupported with min assist. OT Short Term Goal 2 (Week 1): Pt will donn a pullover shirt for two consecutive sessions with min assist. OT Short Term Goal 3 (Week 1): Pt will complete toilet transfer and toileting tasks with no more than mod assist. OT Short Term Goal 4 (Week 1): Pt will integrate the LUE into bathing tasks as a stabilizer with mod assist and mod instructional cueing. OT Short Term Goal 5 (Week 1): Pt will maintain sustained attention to selfcare tasks for 15 mins with no more than min instructional cueing for re-direction.  Skilled Therapeutic Interventions/Progress Updates:    Pt asleep in bed upon arrival but easily aroused. Pt agreeable to getting OOB for bathing/dressing with sit<>stand from w/c at sink. Supine>sit EOB with HOB elevated with mod A. Squat pivot transfer to w/c with min A and mod verbal cues for sequencing. Bathing/dressing with sit<>stand from w/c at sink per Care Tool Sit<>stand from w/c at sink with min A. Standing balance at sink with min A (pt weightbearing on RLE). While standing, support provided at Kt knee, pt engaged in weight shifts Rt<>Lt. Pt recalled hemi dressing techniques but required assistance to employ.  Pt required max verbal cues for standing posture and to attend to his Lt. Pt agreeable to remained up in recliner and performed squat pivot transfer to recliner with min A. Pt remained in recliner with all needs within reach and belt alarm activated.  Therapy Documentation Precautions:  Precautions Precautions: Fall,Other (comment) Precaution Comments: L hemiplegia, L inattention Restrictions Weight Bearing Restrictions:  No Pain: Pain Assessment Pain Scale: 0-10 Pain Score: 0-No pain Faces Pain Scale: No hurt   Therapy/Group: Individual Therapy  Leroy Libman 04/30/2020, 2:39 PM

## 2020-04-30 NOTE — Progress Notes (Addendum)
Physical Therapy Session Note  Patient Details  Name: Bobby Branch MRN: [id number] Date of Birth: 10/23/1960  Today's Date: 04/30/2020 PT Individual Time: 1016-1113 PT Individual Time Calculation (min): 57 min   Short Term Goals: Week 1:  PT Short Term Goal 1 (Week 1): Pt will perform bed mobility mod assist. PT Short Term Goal 2 (Week 1): Pt will perform bed to chair transfer mod assist. PT Short Term Goal 3 (Week 1): Pt will initiate gait training. PT Short Term Goal 4 (Week 1): Pt will perform sit to stand mod assist.  Skilled Therapeutic Interventions/Progress Updates:     Pt received supine in bed. Pt did not report any pain and agreeable to therapy. RN shortly entered room to give pt drink and left; PT assumed pt care. Pt verbally reported being able to defecate earlier in the morning. Pt did wear PRAFO at night and reported that "it's been helping." Therapist assisted pt with donning briefs in supine via cuing to roll both ways (min assist to L sidelying via verbal cuing tobend R knee and push towards L side, and grab/pull L bed rail with RUE; mod assist to R sidelying with LLE flexion into hooklying). Pt cued to roll into L sidelying min assist with same cues used previously. Sidelying to sitting mod assist with verbal cuing to push with RUE. LLE tendency to ER in sitting. LLE physically kept in neutral and foot re-placed to increase knee flexion to promote WB, and pt transferred to WC towards R squat pivot max assist with verbal cuing to lean towards R side into therapist and therapist facilitating LLE WB for NMR. Pt wheeled to therapy gym and transferred to mat towards L squat pivot max assist with verbal cuing to lean into his R side towards therapist. LE NMR via STS mod assist with verbal cuing to extend L knee and tuck hips into posterior pelvic tilt. No quad contraction felt during standing with minimal glute contraction. Pt continues to lean into therapist on L side, but less than in  previous sessions. While in standing, verbal and tactile cuing on upper anterior trunk to "look up and bring shoulders back" to facilitate scapular retraction and thoracic extension for better standing posture. Performed 5x without rest. Pre-gait NMR via L leg stance and R leg forward/backward stepping (performed 2x10 steps back/forward with sitting rest break). Pt cued to shift weight of hips towards L side. Occasionally tries to lift RLE but gets "stuck;" external cue to "step towards [therapist's] shoe" yielded greater success. Therapist knees used to extend knee from pt's side to prevent knee buckling (with caution to not hyperextend) and therapist hand used to cue posterior pelvic tilt. Gait training ~7ft +2 max assist with 100% therapist assistance required to advance L leg and prevent excess ER. Similar cuing, knee blocking, hip extension facilitation necessary when advancing R leg as done with pre-gait activity. Pt holding PT and tech's hand throughout ambulation. Repeated after rest break, but only able to achieve ~3-4 ft before sitting down in WC. SLP met in therapy gym and assumed pt care.   Therapy Documentation Precautions:  Precautions Precautions: Fall,Other (comment) Precaution Comments: L hemiplegia, L inattention Restrictions Weight Bearing Restrictions: No    Therapy/Group: Individual Therapy  Kishan Patel, SPT 04/30/2020, 11:16 AM  

## 2020-04-30 NOTE — Progress Notes (Signed)
PROGRESS NOTE   Subjective/Complaints: Used inhaler at home, requests it TID No cough  Does not have PCP ROS: Denies CP, SOB, N/V/D  Objective:   No results found. No results for input(s): WBC, HGB, HCT, PLT in the last 72 hours. No results for input(s): NA, K, CL, CO2, GLUCOSE, BUN, CREATININE, CALCIUM in the last 72 hours.  Intake/Output Summary (Last 24 hours) at 04/30/2020 0818 Last data filed at 04/30/2020 0454 Gross per 24 hour  Intake 720 ml  Output 1400 ml  Net -680 ml        Physical Exam: Vital Signs Blood pressure 136/80, pulse (!) 57, temperature 98 F (36.7 C), resp. rate 19, height '5\' 6"'$  (1.676 m), weight 49.2 kg, SpO2 99 %. Constitutional: No distress . Vital signs reviewed. HENT: Normocephalic.  Atraumatic. Eyes: EOMI. No discharge. Cardiovascular: No JVD.  RRR. Respiratory: Normal effort.  No stridor.  Bilateral clear to auscultation. GI: Non-distended.  BS +. Skin: Warm and dry.  Intact. Psych: Normal mood.  Normal behavior. Musc: No edema in extremities.  No tenderness in extremities. Neuro: Alert Motor:  RUE/RLE: 5/5 proximal distal  LUE/LLE: 0/5 proximal distal Increased tone noted in left upper extremity   Assessment/Plan: 1. Functional deficits which require 3+ hours per day of interdisciplinary therapy in a comprehensive inpatient rehab setting.  Physiatrist is providing close team supervision and 24 hour management of active medical problems listed below.  Physiatrist and rehab team continue to assess barriers to discharge/monitor patient progress toward functional and medical goals  Care Tool:  Bathing    Body parts bathed by patient: Left arm,Chest,Abdomen,Right upper leg,Left upper leg,Face,Front perineal area,Right lower leg   Body parts bathed by helper: Left lower leg,Right arm,Buttocks     Bathing assist Assist Level: Moderate Assistance - Patient 50 - 74%     Upper  Body Dressing/Undressing Upper body dressing   What is the patient wearing?: Pull over shirt    Upper body assist Assist Level: Moderate Assistance - Patient 50 - 74%    Lower Body Dressing/Undressing Lower body dressing      What is the patient wearing?: Pants     Lower body assist Assist for lower body dressing: Maximal Assistance - Patient 25 - 49%     Toileting Toileting    Toileting assist Assist for toileting: Moderate Assistance - Patient 50 - 74%     Transfers Chair/bed transfer  Transfers assist     Chair/bed transfer assist level: Maximal Assistance - Patient 25 - 49%     Locomotion Ambulation   Ambulation assist   Ambulation activity did not occur: Safety/medical concerns          Walk 10 feet activity   Assist  Walk 10 feet activity did not occur: Safety/medical concerns        Walk 50 feet activity   Assist Walk 50 feet with 2 turns activity did not occur: Safety/medical concerns         Walk 150 feet activity   Assist Walk 150 feet activity did not occur: Safety/medical concerns         Walk 10 feet on uneven surface  activity  Assist Walk 10 feet on uneven surfaces activity did not occur: Safety/medical concerns         Wheelchair     Assist Will patient use wheelchair at discharge?: Yes (Per PT long term goals) Type of Wheelchair: Manual           Wheelchair 50 feet with 2 turns activity    Assist    Wheelchair 50 feet with 2 turns activity did not occur: Safety/medical concerns       Wheelchair 150 feet activity     Assist  Wheelchair 150 feet activity did not occur: Safety/medical concerns       Blood pressure 136/80, pulse (!) 57, temperature 98 F (36.7 C), resp. rate 19, height '5\' 6"'$  (1.676 m), weight 49.2 kg, SpO2 99 %. Medical Problem List and Plan: 1.  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  Impaired mobility: -DVT/anticoagulation: Conitnue SCDs             -antiplatelet therapy: Continue Aspirin 325 mg daily and Plavix 75 mg daily x3 months 3. Left shoulder pain: Tylenol as needed. Add voltaren gel and lidocaine patch.  4. Depression: Continue Lexapro 10 mg daily             -antipsychotic agents: N/A 5. Neuropsych: This patient is capable of making decisions on his own behalf. 6. Skin/Wound Care: Routine skin checks 7. Fluids/Electrolytes/Nutrition: Routine in and outs 8.  Hypertension.  BP has been labile- continue Norvasc 10 mg daily.  Monitor with increased mobility controlled on 3/28 Vitals:   04/29/20 1927 04/30/20 0429  BP: (!) 146/85 136/80  Pulse: 71 (!) 57  Resp: 18 19  Temp: 99.6 F (37.6 C) 98 F (36.7 C)  SpO2: 99% 99%   9.  Diabetes mellitus with hyperglycemia.  Hemoglobin A1c 7.3.  SSI.  Patient on Glucophage 500 mg twice daily prior to admission.  Resume as needed CBG (last 3)  Recent Labs    04/29/20 2354 04/30/20 0429 04/30/20 0746  GLUCAP 186* 191* 146*   Will consider restarting Metformin if kidney function improves   10.  Hyperlipidemia: Lipitor 11.  Polysubstance abuse.  Urine drug screen positive cocaine as well as marijuana.  ask neuropsych to provide  Counseling, pt plans to attend NA as OP   Tobacco abuse added nicoderm patch 48mg 12.  CKD stage III.    Creatinine 1.95 on 3/22, labs ordered for tomorrow 13. 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 Scheduled at 11pm as per patient's request. Will not increase during day due to grogginess this morning.  15.  Slow transit constipation  Bowel meds increased on 3/27- has required 2 enemas during rehab admit   LOS: 7 days A FACE TO FWilliamsE Kirsteins 04/30/2020, 8:18 AM

## 2020-05-01 ENCOUNTER — Inpatient Hospital Stay (HOSPITAL_COMMUNITY): Payer: Medicaid Other

## 2020-05-01 LAB — GLUCOSE, CAPILLARY
Glucose-Capillary: 134 mg/dL — ABNORMAL HIGH (ref 70–99)
Glucose-Capillary: 149 mg/dL — ABNORMAL HIGH (ref 70–99)
Glucose-Capillary: 227 mg/dL — ABNORMAL HIGH (ref 70–99)
Glucose-Capillary: 235 mg/dL — ABNORMAL HIGH (ref 70–99)

## 2020-05-01 MED ORDER — POLYETHYLENE GLYCOL 3350 17 G PO PACK
17.0000 g | PACK | Freq: Every day | ORAL | Status: DC
  Administered 2020-05-03 – 2020-05-22 (×15): 17 g via ORAL
  Filled 2020-05-01 (×21): qty 1

## 2020-05-01 MED ORDER — SODIUM CHLORIDE 0.45 % IV BOLUS
500.0000 mL | Freq: Once | INTRAVENOUS | Status: AC
  Administered 2020-05-01: 500 mL via INTRAVENOUS

## 2020-05-01 NOTE — Progress Notes (Signed)
Physical Therapy Session Note  Patient Details  Name: Bobby Branch MRN: [id number] Date of Birth: 01-Apr-1960  Today's Date: 05/01/2020 PT Individual Time: [id number]  Missed time 30 mins   PT attempted to see pt for scheduled PT treatment, pt received in bed and refused to participate in PT session. PT attempted to educate pt on importance of participation with therapy for functional improvements however pt continued to refuse reporting he was too tired and wanted to rest. Pt continued to decline to participate despite max efforts  [name] 05/01/2020, 3:31 PM

## 2020-05-01 NOTE — Progress Notes (Signed)
Speech Language Pathology Daily Session Note  Patient Details  Name: Gabrien Felman MRN: [id number] Date of Birth: December 04, 1960  Today's Date: 05/01/2020 SLP Individual Time: 1100-1255 SLP Individual Time Calculation (min): 115 min  Short Term Goals: Week 1: SLP Short Term Goal 1 (Week 1): Pt will demonstate ability to use compensatory memory strategies for semi-complex tasks/recall with Min A verbal/visual cues. SLP Short Term Goal 2 (Week 1): Pt will selectively attend to tasks with Min A cues for redirection. SLP Short Term Goal 3 (Week 1): Pt will demonstrate ability to problem solve mildly complex to complex situations with Min A cues. SLP Short Term Goal 4 (Week 1): Pt will detect fuctional errors with Min A cues. SLP Short Term Goal 5 (Week 1): Pt will recall 2 safety precautions and/or therapy techniques with Min A cues.  Skilled Therapeutic Interventions:Skilled SLP intervention focused on cognition. Pt able to verbalize 3 physical limitations. He stated he feels cognition has not changed. Pt completed half of written time calculation task with bus schedule with min A verbal cues for sustained attention. Pt was very conversational with SLP and needed redirection throughout task however this appears to be patients baseline. Cont with therapy per plan of care.       Pain Pain Assessment Pain Scale: Faces Faces Pain Scale: No hurt  Therapy/Group: Individual Therapy  Darrol Poke Eaven Schwager 05/01/2020, 12:39 PM

## 2020-05-01 NOTE — Progress Notes (Addendum)
Physical Therapy Weekly Progress Note  Patient Details  Name: Bobby Branch MRN: [id number] Date of Birth: September 04, 1960  Beginning of progress report period: April 24, 2020 End of progress report period: May 01, 2020  Today's Date: 05/01/2020 PT Individual Time: 0802-0855 and 1015-1055 PT Individual Time Calculation (min): 53 min and 40 min  Patient has met 3 of 4 short term goals.  Pt is making slow progress towards goals and is limited by L inattention, L hemiparesis, poor attention to task, and incontinence. Pt is grossly mod-max assist. Pt initiated gait training but continues to have difficulty coordinating and attending to task to further advance gait training.  Patient continues to demonstrate the following deficits muscle weakness and muscle joint tightness, decreased cardiorespiratoy endurance, decreased midline orientation, decreased attention to left, left side neglect and decreased motor planning, decreased initiation, decreased attention, decreased awareness, decreased problem solving and decreased safety awareness and decreased sitting balance, decreased standing balance, decreased postural control, hemiplegia and decreased balance strategies and therefore will continue to benefit from skilled PT intervention to increase functional independence with mobility.  Patient progressing toward long term goals..  Continue plan of care.  PT Short Term Goals Week 1:  PT Short Term Goal 1 (Week 1): Pt will perform bed mobility mod assist. PT Short Term Goal 1 - Progress (Week 1): Met PT Short Term Goal 2 (Week 1): Pt will perform bed to chair transfer mod assist. PT Short Term Goal 2 - Progress (Week 1): Progressing toward goal PT Short Term Goal 3 (Week 1): Pt will initiate gait training. PT Short Term Goal 3 - Progress (Week 1): Met PT Short Term Goal 4 (Week 1): Pt will perform sit to stand mod assist. PT Short Term Goal 4 - Progress (Week 1): Met Week 2:  PT Short Term Goal 1 (Week  2): Pt will perform bed mobility min assist. PT Short Term Goal 2 (Week 2): Pt will perform bed to chair transfer mod assist. PT Short Term Goal 3 (Week 2): Pt will perform sit to stand min assist. PT Short Term Goal 4 (Week 2): Pt will ambulate >10 ft with LRAD with max assist +2.  Skilled Therapeutic Interventions/Progress Updates:     Session 1 Pt received supine in bed. Did not report any pain and agreeable to therapy. Pt reported defecating multiple times at night. RN entered room to administer medications. Donned pants max assist (inable to actively flex L hip) and verbally cued to bridge to complete donning pants (unable to hold bridge >5sec). Pt cued to roll into L sidelying min assist via verbal cuing tobend R knee and push towards L side. Sidelying to sitting mod assist with verbal cuing to push with RUE. RN administered medications while in sitting and then left room (PT assumed pt care). LLE tendency to ER in sitting. LLE physically kept in neutral and foot re-placed to increase knee flexion to promote WB, and pt transferred to [facility] towards Rsquat pivotmax assist with verbal cuing to lean towards Rside into therapist and therapist facilitating LLE WB for NMR.Pt wheeled to therapy gym. Litegait harness donned max assist and gait training performed on treadmill with PT on involved L side and rehab tech on unaffected R leg. Starting speed = .84mh with 100% manual assistance required to advance L leg. Minimal initial assistance with R leg to set sequencing, and verbal cuing required throughout to "straighten right knee" upon R heel strike and push off. This cuing greatly assisted with advancing L leg  during L swing. Gait training continued at .77mh after one seated rest break.  Pt maintained posterior lean and crouched gait bilaterally throughout (particularly towards the end when fatigued). Totality of gait training = 13 min and 650 steps. Pt walked backwards and lowered into WC. Wheeled back to  room and left seated in WC. Seat belt alarm turned on and needs within reach.  Session 2 Pt received supine in bed with NT in room. NT left soon after and PT assumed pt care. Pt agreeable to therapy and did not report pain, but verbalized the urge to urinate. Pt given urinal and bed tilted upward to assist with flow of gravity. Pt tried for several min but unable to urinate. [facility] and [facility] to toilet. Pt able to urinate and defecate after several min. Pt able to mostly able to perform pericare cleaning independently with min assist to maintain standing in [place] requiring PT to finish cleaning. [facility] to [facility] and wheeled to hallway. Gait training performed ~10 ft +2 max assist with 100% therapist assistance required to advance L leg and prevent excess ER (although greater notice of pt intent to actively assist in L leg advancement). Therapist knees used to extend knee from pt's side to prevent knee buckling (with caution to not hyperextend) and therapist hand used to cue posterior pelvic tilt/hip ext. Pt cued to shift weight of hips towards L side during L stance. Pt holding PT and tech's hand throughout ambulation with +1 WC follow for safety. [facility] back to [facility]. Needs within reach and seat belt alarm turned on.  Therapy Documentation Precautions:  Precautions Precautions: Fall,Other (comment) Precaution Comments: L hemiplegia, L inattention Restrictions Weight Bearing Restrictions: No   Therapy/Group: Individual Therapy  KEleonore Chiquito SPT 05/01/2020, 7:48 AM

## 2020-05-01 NOTE — Progress Notes (Signed)
Nutrition Follow-up  DOCUMENTATION CODES:   Underweight,Severe malnutrition in context of social or environmental circumstances  INTERVENTION:  - Continue Glucerna Shake po TID, each supplement provides 220 kcal and 10 grams of protein  - Continue MVI with minerals daily  - Encourage adequate PO intake  NUTRITION DIAGNOSIS:   Severe Malnutrition related to social / environmental circumstances (hx of polysubstance abuse) as evidenced by severe fat depletion,severe muscle depletion.  Ongoing, being addressed via supplements  GOAL:   Patient will meet greater than or equal to 90% of their needs  Progressing  MONITOR:   PO intake,Supplement acceptance,Labs,Weight trends  REASON FOR ASSESSMENT:   Other (low BMI)    ASSESSMENT:   60 year old male with PMH of prior CVA in August 2021, DM, CKD stage III, HTN, HLD, polysubstance abuse. Presented 04/20/20 with acute onset of left-sided weakness. Pt found to have right MCA stroke. Admitted to CIR on 3/21.  Spoke with pt at bedside. Pt with multiple snacks and drinks at bedside. Noted ~25% completed breakfast meal tray at bedside. Pt states that this was his second breakfast meal tray and that he consumed the eggs on his first breakfast meal tray.  Pt complaining of gas and GI distress related to miralax and enema a few days ago. Pt states that he has been in and out of the bathroom. Pt reports that he is not going to drink any Glucerna today due to GI distress. He has been drinking them prior to this. RD encouraged pt not to skip meals today and discussed importance of adequate PO intake in maintaining lean muscle mass and promoting healing.  Meal Completion: 75-100%  Medications reviewed and include: colace, pepcid, Glucerna TID, MVI with minerals, miralax, senna IVF: 1/2NS bolus  Labs reviewed: BUN 23, creatinine 2.44 CBG's: 135-189 x 24 hours  UOP: 925 ml x 24 hours  Diet Order:   Diet Order            Diet Carb  Modified Fluid consistency: Thin; Room service appropriate? Yes  Diet effective now                 EDUCATION NEEDS:   No education needs have been identified at this time  Skin:  Skin Assessment: Skin Integrity Issues: Incisions: right thigh  Last BM:  05/01/20 multiple type 6  Height:   Ht Readings from Last 1 Encounters:  04/23/20 '5\' 6"'$  (1.676 m)    Weight:   Wt Readings from Last 1 Encounters:  04/23/20 49.2 kg    BMI:  Body mass index is 17.51 kg/m.  Estimated Nutritional Needs:   Kcal:  1800-2000  Protein:  80-95 grams  Fluid:  1.8 L/day    Gustavus Bryant, MS, RD, LDN Inpatient Clinical Dietitian Please see AMiON for contact information.

## 2020-05-01 NOTE — Progress Notes (Addendum)
PROGRESS NOTE   Subjective/Complaints:  No issues overnite except multiple stools after Miralax ROS: Denies CP, SOB, N/V/D  Objective:   No results found. No results for input(s): WBC, HGB, HCT, PLT in the last 72 hours. Recent Labs    04/30/20 0846  NA 137  K 4.5  CL 100  CO2 34*  GLUCOSE 162*  BUN 23*  CREATININE 2.44*  CALCIUM 9.1    Intake/Output Summary (Last 24 hours) at 05/01/2020 [id number] Last data filed at 05/01/2020 0013 Gross per 24 hour  Intake 340 ml  Output 925 ml  Net -585 ml        Physical Exam: Vital Signs Blood pressure (!) 141/77, pulse 67, temperature 97.7 F (36.5 C), resp. rate 17, height '5\' 6"'$  (1.676 m), weight 49.2 kg, SpO2 99 %.  General: No acute distress Mood and affect are appropriate Heart: Regular rate and rhythm no rubs murmurs or extra sounds Lungs: Clear to auscultation, breathing unlabored, no rales or wheezes Abdomen: Positive bowel sounds, soft nontender to palpation, nondistended Extremities: No clubbing, cyanosis, or edema Skin: No evidence of breakdown, no evidence of rash  Neuro: Alert Motor:  RUE/RLE: 5/5 proximal distal  LUE/LLE: 0/5 proximal distal Increased tone noted in left upper extremity   Assessment/Plan: 1. Functional deficits which require 3+ hours per day of interdisciplinary therapy in a comprehensive inpatient rehab setting.  Physiatrist is providing close team supervision and 24 hour management of active medical problems listed below.  Physiatrist and rehab team continue to assess barriers to discharge/monitor patient progress toward functional and medical goals  Care Tool:  Bathing    Body parts bathed by patient: Left arm,Chest,Abdomen,Front perineal area,Right lower leg,Right upper leg,Left upper leg,Face   Body parts bathed by helper: Left lower leg,Buttocks,Right arm     Bathing assist Assist Level: Moderate Assistance - Patient 50 -  74%     Upper Body Dressing/Undressing Upper body dressing   What is the patient wearing?: Pull over shirt    Upper body assist Assist Level: Moderate Assistance - Patient 50 - 74%    Lower Body Dressing/Undressing Lower body dressing      What is the patient wearing?: Pants     Lower body assist Assist for lower body dressing: Maximal Assistance - Patient 25 - 49%     Toileting Toileting    Toileting assist Assist for toileting: Moderate Assistance - Patient 50 - 74%     Transfers Chair/bed transfer  Transfers assist     Chair/bed transfer assist level: Maximal Assistance - Patient 25 - 49%     Locomotion Ambulation   Ambulation assist   Ambulation activity did not occur: Safety/medical concerns  Assist level: 2 helpers (Hand-held assist, No AD) Assistive device: No Device Max distance: 35f   Walk 10 feet activity   Assist  Walk 10 feet activity did not occur: Safety/medical concerns        Walk 50 feet activity   Assist Walk 50 feet with 2 turns activity did not occur: Safety/medical concerns         Walk 150 feet activity   Assist Walk 150 feet activity did not occur: Safety/medical concerns  Walk 10 feet on uneven surface  activity   Assist Walk 10 feet on uneven surfaces activity did not occur: Safety/medical concerns         Wheelchair     Assist Will patient use wheelchair at discharge?: Yes (Per PT long term goals) Type of Wheelchair: Manual           Wheelchair 50 feet with 2 turns activity    Assist    Wheelchair 50 feet with 2 turns activity did not occur: Safety/medical concerns       Wheelchair 150 feet activity     Assist  Wheelchair 150 feet activity did not occur: Safety/medical concerns       Blood pressure (!) 141/77, pulse 67, temperature 97.7 F (36.5 C), resp. rate 17, height '5\' 6"'$  (1.676 m), weight 49.2 kg, SpO2 99 %. Medical Problem List and Plan: 1.  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  Impaired mobility: -DVT/anticoagulation: Conitnue SCDs             -antiplatelet therapy: Continue Aspirin 325 mg daily and Plavix 75 mg daily x3 months 3. Left shoulder pain: Tylenol as needed. Add voltaren gel and lidocaine patch.  4. Depression: Continue Lexapro 10 mg daily             -antipsychotic agents: N/A 5. Neuropsych: This patient is capable of making decisions on his own behalf. 6. Skin/Wound Care: Routine skin checks 7. Fluids/Electrolytes/Nutrition: Routine in and outs 8.  Hypertension.  BP has been labile- continue Norvasc 10 mg daily.  Monitor with increased mobility controlled on 3/29 Vitals:   04/30/20 1939 05/01/20 0417  BP: (!) 145/78 (!) 141/77  Pulse: 63 67  Resp: 15 17  Temp: 97.8 F (36.6 C) 97.7 F (36.5 C)  SpO2: 99% 99%   9.  Diabetes mellitus with hyperglycemia.  Hemoglobin A1c 7.3.  SSI.  Patient on Glucophage 500 mg twice daily prior to admission.  Resume as needed CBG (last 3)  Recent Labs    04/30/20 2000 04/30/20 2357 05/01/20 0414  GLUCAP 178* 189* 149*   Will consider restarting Metformin if kidney function improves, goal is 100-180   10.  Hyperlipidemia: Lipitor 11.  Polysubstance abuse.  Urine drug screen positive cocaine as well as marijuana.  ask neuropsych to provide  Counseling, pt plans to attend NA as OP   Tobacco abuse added nicoderm patch 69mg 12.  CKD stage III.    Creatinine 1.95 on 3/22,up to 2.44 on 3/28, will give IVF 13. 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 Scheduled at 11pm as per patient's request. Will not increase during day due to grogginess this morning.  15.  Slow transit  constipation  Bowel meds increased on 3/27- has required 2 enemas during rehab admit  16.  CHronic cough, smoker , check CXR- prn inhaler LOS: 8 days A FACE TO FACE EVALUATION WAS PERFORMED  ACharlett Blake3/29/2022, 7:22 AM

## 2020-05-02 LAB — GLUCOSE, CAPILLARY
Glucose-Capillary: 112 mg/dL — ABNORMAL HIGH (ref 70–99)
Glucose-Capillary: 154 mg/dL — ABNORMAL HIGH (ref 70–99)
Glucose-Capillary: 155 mg/dL — ABNORMAL HIGH (ref 70–99)
Glucose-Capillary: 174 mg/dL — ABNORMAL HIGH (ref 70–99)
Glucose-Capillary: 220 mg/dL — ABNORMAL HIGH (ref 70–99)
Glucose-Capillary: 232 mg/dL — ABNORMAL HIGH (ref 70–99)

## 2020-05-02 NOTE — Progress Notes (Addendum)
Physical Therapy Session Note  Patient Details  Name: Xzander Crumrine MRN: [id number] Date of Birth: 11/04/1960  Today's Date: 05/02/2020 PT Individual Time: 1100-1154 PT Individual Time Calculation (min): 54 min   Short Term Goals: Week 2:  PT Short Term Goal 1 (Week 2): Pt will perform bed mobility min assist. PT Short Term Goal 2 (Week 2): Pt will perform bed to chair transfer mod assist. PT Short Term Goal 3 (Week 2): Pt will perform sit to stand min assist. PT Short Term Goal 4 (Week 2): Pt will ambulate >10 ft with LRAD with max assist +2.  Skilled Therapeutic Interventions/Progress Updates:    Pt received supine in bed. Pt did not report pain and agreeable to therapy. Ted hose, socks, and pants donned max assist; unable to actively flex L hip. Verbally cued to bridge to complete donning pants (unable to hold bridge >5sec). Pt cued to roll into L sidelying min assist via verbal cuing tobend R knee and push towards L side.Sidelying to sitting min-modassist with verbal cuing to push with RUE. LLE tendency to ER in sitting. LLE physically kept in neutral and foot re-placed to increase knee flexion to promote WB, and pt transferred to [facility] towards Rsquat pivotmod-max assist with verbal cuing to lean towards Rside into therapist and therapist facilitating [place] NMR. Pt yawned and verbalized temporary pain in L hand immediately after. Teeth brushing ADL completed with supervision in WC at sink. Pt wheeled to therapy gym. Litegait harness donned max assist and gait training performed on treadmill with PT on involved L side. Starting speed = .73mh; 100% manual assistance initially required to advance L leg, and after a few min pt able to assist with L swing when initiated after L push off. Verbal cuing required throughout to "straighten right knee" upon R heel strike and push off. This cuing greatly assisted with advancing L leg during L swing. PT consistently placed L ankle in DF before heel  strike on each step, although flexor withdrawal noticed when this was done. Speed increased to 1.059m. Pt able to reduce posterior lean today when prompted (in comparison to yesterday when he could not), and demonstrated decreased crouching throughout session (still present nonetheless likely due to increased tone or presence of flexor withdrawal reflex). Knee flex present during L pushoff throughout. Totality of gait training = 10 min and 734 ft. Pt walked backwards and lowered into WC. Wheeled back to room and left seated in WC. Seat belt alarm turned on and needs within reach.  Therapy Documentation Precautions:  Precautions Precautions: Fall,Other (comment) Precaution Comments: L hemiplegia, L inattention Restrictions Weight Bearing Restrictions: No    Therapy/Group: Individual Therapy  KiEleonore ChiquitoSPT 05/02/2020, 11:55 AM

## 2020-05-02 NOTE — Progress Notes (Signed)
Speech Language Pathology Daily Session Note  Patient Details  Name: Bobby Branch MRN: [id number] Date of Birth: 18-Sep-1960  Today's Date: 05/02/2020 SLP Individual Time: 0732-0829 SLP Individual Time Calculation (min): 57 min  Short Term Goals: Week 1: SLP Short Term Goal 1 (Week 1): Pt will demonstate ability to use compensatory memory strategies for semi-complex tasks/recall with Min A verbal/visual cues. SLP Short Term Goal 2 (Week 1): Pt will selectively attend to tasks with Min A cues for redirection. SLP Short Term Goal 3 (Week 1): Pt will demonstrate ability to problem solve mildly complex to complex situations with Min A cues. SLP Short Term Goal 4 (Week 1): Pt will detect fuctional errors with Min A cues. SLP Short Term Goal 5 (Week 1): Pt will recall 2 safety precautions and/or therapy techniques with Min A cues.  Skilled Therapeutic Interventions: Pt was seen for skilled ST targeting cognitive goals. Upon arrival, pt expressed frustration with being unable to finish his breakfast before therapy started. He was also very verbose during session, therefore, SLP facilitated sustained attention to intake with Min-Mod verbal cues for redirection to task to increase efficiency. During a semi-complex time-based problem solving scenario task, pt calculated and times given hypothetical scenarios with Min A verbal cues for error awareness and verbal problem solving. He demonstrated excellent functional recall of transfer techniques (ex: getting from bed to chair) and previous ST session targets with Supervision A question cues. Pt left laying in bed with alarm set and needs within reach. Continue per current plan of care.         Pain Pain Assessment Pain Scale: Faces Faces Pain Scale: No hurt  Therapy/Group: Individual Therapy  Arbutus Leas 05/02/2020, 7:21 AM

## 2020-05-02 NOTE — Progress Notes (Signed)
Patient ID: Bobby Branch, male   DOB: 1960-07-23, 60 y.o.   MRN: [id number] Team Conference Report to Patient/Family  Team Conference discussion was reviewed with the patient and caregiver, including goals, any changes in plan of care and target discharge date.  Patient and caregiver express understanding and are in agreement.  The patient has a target discharge date of 05/22/20.  Sw met with pt, called dtr at bedside-left VM. Pt requesting follow up from [facility]. SW will cont to follow up.   Dyanne Iha 05/02/2020, 2:14 PM

## 2020-05-02 NOTE — Progress Notes (Signed)
PROGRESS NOTE   Subjective/Complaints:  Discussed CBG and CXR ROS: Denies CP, SOB, N/V/D  Objective:   DG Chest 2 View  Result Date: 05/01/2020 CLINICAL DATA:  Cough, things are getting caught in the throat. EXAM: CHEST - 2 VIEW COMPARISON:  None. FINDINGS: Elevation of the right diaphragm. No focal consolidation. No pleural effusion or pneumothorax. Heart and mediastinal contours are unremarkable. No acute osseous abnormality. IMPRESSION: No active cardiopulmonary disease. Electronically Signed   By: Kathreen Devoid   On: 05/01/2020 18:04   No results for input(s): WBC, HGB, HCT, PLT in the last 72 hours. Recent Labs    04/30/20 0846  NA 137  K 4.5  CL 100  CO2 34*  GLUCOSE 162*  BUN 23*  CREATININE 2.44*  CALCIUM 9.1    Intake/Output Summary (Last 24 hours) at 05/02/2020 0830 Last data filed at 05/01/2020 2118 Gross per 24 hour  Intake 740 ml  Output 200 ml  Net 540 ml        Physical Exam: Vital Signs Blood pressure (!) 143/75, pulse (!) 59, temperature 98.1 F (36.7 C), resp. rate 18, height '5\' 6"'$  (1.676 m), weight 49.2 kg, SpO2 97 %.  General: No acute distress Mood and affect are appropriate Heart: Regular rate and rhythm no rubs murmurs or extra sounds Lungs: Clear to auscultation, breathing unlabored, no rales or wheezes Abdomen: Positive bowel sounds, soft nontender to palpation, nondistended Extremities: No clubbing, cyanosis, or edema Skin: No evidence of breakdown, no evidence of rash Neuro: Alert Motor:  RUE/RLE: 5/5 proximal distal  LUE/LLE: 0/5 proximal distal Increased tone noted in left upper extremity   Assessment/Plan: 1. Functional deficits which require 3+ hours per day of interdisciplinary therapy in a comprehensive inpatient rehab setting.  Physiatrist is providing close team supervision and 24 hour management of active medical problems listed below.  Physiatrist and rehab team  continue to assess barriers to discharge/monitor patient progress toward functional and medical goals  Care Tool:  Bathing    Body parts bathed by patient: Left arm,Chest,Abdomen,Front perineal area,Right lower leg,Right upper leg,Left upper leg,Face   Body parts bathed by helper: Left lower leg,Buttocks,Right arm     Bathing assist Assist Level: Moderate Assistance - Patient 50 - 74%     Upper Body Dressing/Undressing Upper body dressing   What is the patient wearing?: Pull over shirt    Upper body assist Assist Level: Moderate Assistance - Patient 50 - 74%    Lower Body Dressing/Undressing Lower body dressing      What is the patient wearing?: Pants     Lower body assist Assist for lower body dressing: Maximal Assistance - Patient 25 - 49%     Toileting Toileting    Toileting assist Assist for toileting: Moderate Assistance - Patient 50 - 74%     Transfers Chair/bed transfer  Transfers assist     Chair/bed transfer assist level: Maximal Assistance - Patient 25 - 49%     Locomotion Ambulation   Ambulation assist   Ambulation activity did not occur: Safety/medical concerns  Assist level: 2 helpers Assistive device: Lite Gait Max distance: 650 steps (Litegait on treadmill)   Walk 10 feet  activity   Assist  Walk 10 feet activity did not occur: Safety/medical concerns        Walk 50 feet activity   Assist Walk 50 feet with 2 turns activity did not occur: Safety/medical concerns         Walk 150 feet activity   Assist Walk 150 feet activity did not occur: Safety/medical concerns         Walk 10 feet on uneven surface  activity   Assist Walk 10 feet on uneven surfaces activity did not occur: Safety/medical concerns         Wheelchair     Assist Will patient use wheelchair at discharge?: Yes (Per PT long term goals) Type of Wheelchair: Manual           Wheelchair 50 feet with 2 turns activity    Assist     Wheelchair 50 feet with 2 turns activity did not occur: Safety/medical concerns       Wheelchair 150 feet activity     Assist  Wheelchair 150 feet activity did not occur: Safety/medical concerns       Blood pressure (!) 143/75, pulse (!) 59, temperature 98.1 F (36.7 C), resp. rate 18, height '5\' 6"'$  (1.676 m), weight 49.2 kg, SpO2 97 %. Medical Problem List and Plan: 1.  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  Impaired mobility: -DVT/anticoagulation: Conitnue SCDs             -antiplatelet therapy: Continue Aspirin 325 mg daily and Plavix 75 mg daily x3 months 3. Left shoulder pain: Tylenol as needed. Add voltaren gel and lidocaine patch.  4. Depression: Continue Lexapro 10 mg daily             -antipsychotic agents: N/A 5. Neuropsych: This patient is capable of making decisions on his own behalf. 6. Skin/Wound Care: Routine skin checks 7. Fluids/Electrolytes/Nutrition: Routine in and outs 8.  Hypertension.  BP has been labile- continue Norvasc 10 mg daily.  Monitor with increased mobility controlled on 3/30 Vitals:   05/01/20 1954 05/02/20 0345  BP: 130/70 (!) 143/75  Pulse: 61 (!) 59  Resp: 18 18  Temp: 98.7 F (37.1 C) 98.1 F (36.7 C)  SpO2: 99% 97%   9.  Diabetes mellitus with hyperglycemia.  Hemoglobin A1c 7.3.  SSI.  Patient on Glucophage 500 mg twice daily prior to admission.  Resume as needed CBG (last 3)  Recent Labs    05/01/20 2352 05/02/20 0343 05/02/20 0740  GLUCAP 235* 174* 112*   Am CBG ok elevated last evening  Pt states he has "problems with blood vessels in eyes and was told he needs to keep CBGs normal  10.  Hyperlipidemia: Lipitor 11.  Polysubstance abuse.  Urine drug screen positive cocaine as well as marijuana.   ask neuropsych to provide  Counseling, pt plans to attend NA as OP   Tobacco abuse added nicoderm patch 42mg 12.  CKD stage III.    Creatinine 1.95 on 3/22,up to 2.44 on 3/28, will give IVF 13. 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 Scheduled at 11pm as per patient's request. Will not increase during day due to grogginess this morning.  15.  Slow transit constipation  Bowel meds increased  on 3/27- has required 2 enemas during rehab admit  16.  CHronic cough, smoker , check CXR- prn inhaler LOS: 9 days A FACE TO FACE EVALUATION WAS PERFORMED  Charlett Blake 05/02/2020, 8:30 AM

## 2020-05-02 NOTE — Progress Notes (Signed)
Patient ID: Bobby Branch, male   DOB: July 05, 1960, 60 y.o.   MRN: [id number]   SW contacted [facility] to follow up with pt and family.   [place], [place]

## 2020-05-02 NOTE — Patient Care Conference (Signed)
Inpatient RehabilitationTeam Conference and Plan of Care Update Date: 05/02/2020   Time: 10:54 AM    Patient Name: Bobby Branch      Medical Record Number: [id number]  Date of Birth: [birth year] Sex: Male         Room/Bed: 4M05C/4M05C-01 Payor Info: Payor: MEDICAID PENDING / Plan: MEDICAID PENDING / Product Type: *No Product type* /    Admit Date/Time:  04/23/2020  6:20 PM  Primary Diagnosis:  Right middle cerebral artery stroke [facility])  Hospital Problems: Principal Problem:   Right middle cerebral artery stroke ([place]) Active Problems:   Protein-calorie malnutrition, severe   Slow transit constipation   Spastic hemiplegia affecting nondominant side ([place])   Stage 3b chronic kidney disease ([place])   Essential hypertension   Controlled type 2 diabetes mellitus with hyperglycemia, without long-term current use of insulin [facility])    Expected Discharge Date: Expected Discharge Date: 05/22/20  Team Members Present: Physician leading conference: Dr. Alysia Penna Care Coodinator Present: Dorien Chihuahua, RN, BSN, CRRN;Christina Brookridge, BSW Nurse Present: Dorien Chihuahua, RN PT Present: Estevan Ryder, PT OT Present: Willeen Cass, OT SLP Present: Jettie Booze, CF-SLP PPS Coordinator present : Ileana Ladd, Burna Mortimer, SLP     Current Status/Progress Goal Weekly Team Focus  Bowel/Bladder   Pt. is incontineent of bowel but continenet of bladder. LBM 05/02/2020  Pt. will maintain normal B/B patetern  Toilet patient q2 and  prn on every shift   Swallow/Nutrition/ Hydration             ADL's   Min A UB bathing, mod A UB dressing. Mod A LB bathing and dressing. Pt with increased tone in the LUE and [place] I-II. Decreased attention  supervision to min assist overall  ADL retraining, transfer training, balance retraining, L NMR   Mobility   Supine to L sidelying min assist. Supine to R sidelying mod assist. Bed/Chair squat pivot transfer max assist. Sit to stand mod assist.  Initiated gait training max assist +2 (no LLE active motion). L inattention.  min assist overall at ambulatory level  patient education, transfers, bed mobility, gait progressions, L hemi NMR, balance   Communication             Safety/Cognition/ Behavioral Observations  Min A  Supervision  selective attention,awareness, detecting errors.   Pain   Pt. pain is well controlled with current prn pain medications  Assess pt. for pain on every shift and prn and administered pain medication as ordered.  Notify MD if pt. pain is not relief with current prn medications.   Skin   Pt skin remain intact  Pt. skin intergrity will remain intact with no further breakdown.  Assess pt. skin intergrity on every shift and as needed     Discharge Planning:  Pt discharging home with girlfriend 24/7. 1 level home, level entry   Team Discussion: CXR was WNL. CBGs up/down. HS spasticity monitored and constipation addressed. Progress limited by left inattention, inability to complete dual tasks, lack of movement in left lower extremity and distractibility.  Patient on target to meet rehab goals: yes, currently min - mod assist for squat pivot transfers and requires cues for sequencing and weight shifts. Requires max cues for midline orientation during standing with min assist goals set for discharge.   *See Care Plan and progress notes for long and short-term goals.   Revisions to Treatment Plan:   Teaching Needs: Transfers, toileting, medications, secondary stroke risk management, etc.   Current Barriers to Discharge: Decreased  caregiver support  Possible Resolutions to Barriers: Family education     Medical Summary Current Status: Left shoulder , CBG with some lability, off IV but still with prn boluses  Barriers to Discharge: Medical stability   Possible Resolutions to Barriers/Weekly Focus: monitor electrolytes and fluid status   Continued Need for Acute Rehabilitation Level of Care: The  patient requires daily medical management by a physician with specialized training in physical medicine and rehabilitation for the following reasons: Direction of a multidisciplinary physical rehabilitation program to maximize functional independence : Yes Medical management of patient stability for increased activity during participation in an intensive rehabilitation regime.: Yes Analysis of laboratory values and/or radiology reports with any subsequent need for medication adjustment and/or medical intervention. : Yes   I attest that I was present, lead the team conference, and concur with the assessment and plan of the team.   Dorien Chihuahua B 05/02/2020, 3:28 PM

## 2020-05-02 NOTE — Plan of Care (Signed)
  Problem: Consults Goal: RH STROKE PATIENT EDUCATION Description: See Patient Education module for education specifics  Outcome: Progressing   Problem: RH BOWEL ELIMINATION Goal: RH STG MANAGE BOWEL WITH ASSISTANCE Description: STG Manage Bowel with Mod I Assistance. Outcome: Progressing Goal: RH STG MANAGE BOWEL W/MEDICATION W/ASSISTANCE Description: STG Manage Bowel with Medication with Mod I Assistance. Outcome: Progressing   Problem: RH BLADDER ELIMINATION Goal: RH STG MANAGE BLADDER WITH ASSISTANCE Description: STG Manage Bladder With Mod I Assistance Outcome: Progressing   Problem: RH SAFETY Goal: RH STG ADHERE TO SAFETY PRECAUTIONS W/ASSISTANCE/DEVICE Description: STG Adhere to Safety Precautions With mod I Assistance/Device. Outcome: Progressing Goal: RH STG DECREASED RISK OF FALL WITH ASSISTANCE Description: STG Decreased Risk of Fall With Mod I Assistance. Outcome: Progressing   Problem: RH COGNITION-NURSING Goal: RH STG USES MEMORY AIDS/STRATEGIES W/ASSIST TO PROBLEM SOLVE Description: STG Uses Memory Aids/Strategies With cues/reminders Assistance to Problem Solve. Outcome: Progressing Goal: RH STG ANTICIPATES NEEDS/CALLS FOR ASSIST W/ASSIST/CUES Description: STG Anticipates Needs/Calls for Assist With Assistance/Cues. Outcome: Progressing   Problem: RH KNOWLEDGE DEFICIT Goal: RH STG INCREASE KNOWLEDGE OF HYPERTENSION Description: Patient and girlfriend will be able to manage secondary stroke risks including HTN, HLD , DM with medications and dietary modifications and stroke prophylaxis using handouts and educational materials independently   Outcome: Progressing

## 2020-05-02 NOTE — Progress Notes (Signed)
Occupational Therapy Session Note  Patient Details  Name: Bobby Branch MRN: [id number] Date of Birth: 08-05-1960  Today's Date: 05/02/2020 OT Individual Time: 1400-1456 OT Individual Time Calculation (min): 56 min    Short Term Goals: Week 1:  OT Short Term Goal 1 (Week 1): Pt will complete UB bathing sitting unsupported with min assist. OT Short Term Goal 2 (Week 1): Pt will donn a pullover shirt for two consecutive sessions with min assist. OT Short Term Goal 3 (Week 1): Pt will complete toilet transfer and toileting tasks with no more than mod assist. OT Short Term Goal 4 (Week 1): Pt will integrate the LUE into bathing tasks as a stabilizer with mod assist and mod instructional cueing. OT Short Term Goal 5 (Week 1): Pt will maintain sustained attention to selfcare tasks for 15 mins with no more than min instructional cueing for re-direction.  Skilled Therapeutic Interventions/Progress Updates:    Pt received supine with no c/o pain and agreeable to OT session. Pt completed bed mobility with mod cueing and min A, heavy use of bed rails. Stand pivot transfer with min A toward the R with mod cueing. Pt completed UB bathing with CGA, edu provided for hemi technique to wash RUE. Pt required mod cueing for attention to the L and sustained attention to the task. Pt completed UB dressing with min A overall. Sit > stand with min A, manual facilitation for L LE weightbearing/weight shift and to support the LUE. Pt's LUE with high tone throughout session, especially with automatic movements. Pt completed LB bathing with min A. Pt donned new pants with mod A. Pt with BM incontinence on brief. Pt completed activity in standing, facilitating weight shifting with crossing midline to the L with the RUE. Pt completed stand pivot transfer to the recliner, pt had sudden knee buckling with full LOB, requiring max A from OT to safely terminate transfer. He was left sitting up in the recliner with the chair alarm  belt fastened and all needs met.   Therapy Documentation Precautions:  Precautions Precautions: Fall,Other (comment) Precaution Comments: L hemiplegia, L inattention Restrictions Weight Bearing Restrictions: No   Therapy/Group: Individual Therapy  Curtis Sites 05/02/2020, 6:26 AM

## 2020-05-03 ENCOUNTER — Encounter (HOSPITAL_COMMUNITY): Payer: Self-pay | Admitting: Physical Medicine & Rehabilitation

## 2020-05-03 LAB — BASIC METABOLIC PANEL
Anion gap: 5 (ref 5–15)
BUN: 26 mg/dL — ABNORMAL HIGH (ref 6–20)
CO2: 33 mmol/L — ABNORMAL HIGH (ref 22–32)
Calcium: 8.9 mg/dL (ref 8.9–10.3)
Chloride: 102 mmol/L (ref 98–111)
Creatinine, Ser: 2.43 mg/dL — ABNORMAL HIGH (ref 0.61–1.24)
GFR, Estimated: 30 mL/min — ABNORMAL LOW (ref >60)
Glucose, Bld: 175 mg/dL — ABNORMAL HIGH (ref 70–99)
Potassium: 4.3 mmol/L (ref 3.5–5.1)
Sodium: 140 mmol/L (ref 135–145)

## 2020-05-03 LAB — GLUCOSE, CAPILLARY
Glucose-Capillary: 114 mg/dL — ABNORMAL HIGH (ref 70–99)
Glucose-Capillary: 154 mg/dL — ABNORMAL HIGH (ref 70–99)
Glucose-Capillary: 170 mg/dL — ABNORMAL HIGH (ref 70–99)
Glucose-Capillary: 174 mg/dL — ABNORMAL HIGH (ref 70–99)
Glucose-Capillary: 185 mg/dL — ABNORMAL HIGH (ref 70–99)
Glucose-Capillary: 226 mg/dL — ABNORMAL HIGH (ref 70–99)

## 2020-05-03 MED ORDER — SODIUM CHLORIDE 0.45 % IV SOLN: INTRAVENOUS | Status: DC

## 2020-05-03 NOTE — Progress Notes (Signed)
PROGRESS NOTE   Subjective/Complaints:  Good BM this am , no pain c/os  ROS: Denies CP, SOB, N/V/D  Objective:   DG Chest 2 View  Result Date: 05/01/2020 CLINICAL DATA:  Cough, things are getting caught in the throat. EXAM: CHEST - 2 VIEW COMPARISON:  None. FINDINGS: Elevation of the right diaphragm. No focal consolidation. No pleural effusion or pneumothorax. Heart and mediastinal contours are unremarkable. No acute osseous abnormality. IMPRESSION: No active cardiopulmonary disease. Electronically Signed   By: Kathreen Devoid   On: 05/01/2020 18:04   No results for input(s): WBC, HGB, HCT, PLT in the last 72 hours. Recent Labs    05/03/20 0456  NA 140  K 4.3  CL 102  CO2 33*  GLUCOSE 175*  BUN 26*  CREATININE 2.43*  CALCIUM 8.9    Intake/Output Summary (Last 24 hours) at 05/03/2020 0928 Last data filed at 05/03/2020 0750 Gross per 24 hour  Intake 177 ml  Output 1500 ml  Net -1323 ml        Physical Exam: Vital Signs Blood pressure (!) 162/93, pulse 65, temperature 97.7 F (36.5 C), resp. rate 18, height '5\' 6"'$  (1.676 m), weight 49.2 kg, SpO2 97 %.  General: No acute distress Mood and affect are appropriate Heart: Regular rate and rhythm no rubs murmurs or extra sounds Lungs: Clear to auscultation, breathing unlabored, no rales or wheezes Abdomen: Positive bowel sounds, soft nontender to palpation, nondistended Extremities: No clubbing, cyanosis, or edema Skin: No evidence of breakdown, no evidence of rash Neuro: Alert Motor:  RUE/RLE: 5/5 proximal distal  LUE/LLE: 0/5 proximal distal Increased tone noted in left upper extremity   Assessment/Plan: 1. Functional deficits which require 3+ hours per day of interdisciplinary therapy in a comprehensive inpatient rehab setting.  Physiatrist is providing close team supervision and 24 hour management of active medical problems listed below.  Physiatrist and rehab  team continue to assess barriers to discharge/monitor patient progress toward functional and medical goals  Care Tool:  Bathing    Body parts bathed by patient: Left arm,Chest,Abdomen,Front perineal area,Right lower leg,Right upper leg,Left upper leg,Face   Body parts bathed by helper: Left lower leg,Buttocks,Right arm     Bathing assist Assist Level: Moderate Assistance - Patient 50 - 74%     Upper Body Dressing/Undressing Upper body dressing   What is the patient wearing?: Pull over shirt    Upper body assist Assist Level: Moderate Assistance - Patient 50 - 74%    Lower Body Dressing/Undressing Lower body dressing      What is the patient wearing?: Pants     Lower body assist Assist for lower body dressing: Maximal Assistance - Patient 25 - 49%     Toileting Toileting    Toileting assist Assist for toileting: Moderate Assistance - Patient 50 - 74%     Transfers Chair/bed transfer  Transfers assist     Chair/bed transfer assist level: Maximal Assistance - Patient 25 - 49%     Locomotion Ambulation   Ambulation assist   Ambulation activity did not occur: Safety/medical concerns  Assist level: Total Assistance - Patient < 25% Assistive device: Lite Gait Max distance: 746f (Lite  Gait)   Walk 10 feet activity   Assist  Walk 10 feet activity did not occur: Safety/medical concerns        Walk 50 feet activity   Assist Walk 50 feet with 2 turns activity did not occur: Safety/medical concerns         Walk 150 feet activity   Assist Walk 150 feet activity did not occur: Safety/medical concerns         Walk 10 feet on uneven surface  activity   Assist Walk 10 feet on uneven surfaces activity did not occur: Safety/medical concerns         Wheelchair     Assist Will patient use wheelchair at discharge?: Yes (Per PT long term goals) Type of Wheelchair: Manual           Wheelchair 50 feet with 2 turns  activity    Assist    Wheelchair 50 feet with 2 turns activity did not occur: Safety/medical concerns       Wheelchair 150 feet activity     Assist  Wheelchair 150 feet activity did not occur: Safety/medical concerns       Blood pressure (!) 162/93, pulse 65, temperature 97.7 F (36.5 C), resp. rate 18, height '5\' 6"'$  (1.676 m), weight 49.2 kg, SpO2 97 %. Medical Problem List and Plan: 1.  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  Impaired mobility: -DVT/anticoagulation: Conitnue SCDs             -antiplatelet therapy: Continue Aspirin 325 mg daily and Plavix 75 mg daily x3 months 3. Left shoulder pain: Tylenol as needed. Add voltaren gel and lidocaine patch.  4. Depression: Continue Lexapro 10 mg daily             -antipsychotic agents: N/A 5. Neuropsych: This patient is capable of making decisions on his own behalf. 6. Skin/Wound Care: Routine skin checks 7. Fluids/Electrolytes/Nutrition: Routine in and outs 8.  Hypertension.  BP has been labile- continue Norvasc 10 mg daily.  Monitor with increased mobility controlled on 3/30 Vitals:   05/03/20 0224 05/03/20 0425  BP: (!) 150/79 (!) 162/93  Pulse: 60 65  Resp: 18 18  Temp: 98.3 F (36.8 C) 97.7 F (36.5 C)  SpO2: 98% 97%   9.  Diabetes mellitus with hyperglycemia.  Hemoglobin A1c 7.3.  SSI.  Patient on Glucophage 500 mg twice daily prior to admission.  Resume as needed CBG (last 3)  Recent Labs    05/02/20 2116 05/02/20 2352 05/03/20 0459  GLUCAP 220* 232* 170*   Am CBG ok elevated last evening  Pt states he has "problems with blood vessels in eyes and was told he needs to keep CBGs normal  10.  Hyperlipidemia: Lipitor 11.  Polysubstance abuse.  Urine drug screen positive cocaine as  well as marijuana.  ask neuropsych to provide  Counseling, pt plans to attend NA as OP   Tobacco abuse added nicoderm patch 24mg 12.  CKD stage III.    Creatinine 1.95 on 3/22,up to 2.44 on 3/28, 2.43 on 3/31 will give IVF qhs 13. 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 Scheduled at 11pm as per patient's request. Will not increase during day due to grogginess this morning.  15.  Slow transit constipation  Bowel meds increased on 3/27- has required 2 enemas during rehab admit  16.  CHronic cough, smoker , check CXR- prn inhaler LOS: 10 days A FACE TO FACE EVALUATION WAS PERFORMED  Charlett Blake 05/03/2020, 9:28 AM

## 2020-05-03 NOTE — Progress Notes (Signed)
Pt fall occurred on 05/03/20 at [place]. Fall was unwitnessed. Bed was in the lowest position and bed alarm was set. Pt states they were attempting to get something off the floor and slid onto the floor without any injury or hitting their head. Pain rated at a 0 out of 10 point scale. Assessment and vitals completed. Will continue to monitor.   The patient does not have a history of falls. I did complete a risk assessment for falls. A plan of care for falls was documented.   05/03/20 0025  What Happened  Was fall witnessed? No  Was patient injured? No  Patient found on floor  Found by Staff-comment  Stated prior activity other (comment) (in Bed trying to reach remote on floor)  Follow Up  MD notified  Bobby Branch)  Time MD notified [place] notified No - patient refusal  Additional tests No  Progress note created (see row info) Yes  Adult Fall Risk Assessment  Risk Factor Category (scoring not indicated) Fall has occurred during this admission (document High fall risk)  Age 33  Fall History: Fall within 6 months prior to admission 5  Elimination; Bowel and/or Urine Incontinence 0  Elimination; Bowel and/or Urine Urgency/Frequency 0  Medications: includes PCA/Opiates, Anti-convulsants, Anti-hypertensives, Diuretics, Hypnotics, Laxatives, Sedatives, and Psychotropics 5  Patient Care Equipment 1  Mobility-Assistance 2  Mobility-Gait 2  Mobility-Sensory Deficit 0  Altered awareness of immediate physical environment 0  Impulsiveness 0  Lack of understanding of one's physical/cognitive limitations 4  Total Score 19  Patient Fall Risk Level High fall risk  Adult Fall Risk Interventions  Required Bundle Interventions *See Row Information* High fall risk - low, moderate, and high requirements implemented  Additional Interventions HeadStart bed sensor (education/return demonstration);HeadStart chair sensor (education/return demonstration);Use of appropriate toileting equipment (bedpan, BSC,  etc.);Lap belt while in chair/wheelchair (Rehab only)  Screening for Fall Injury Risk (To be completed on HIGH fall risk patients) - Assessing Need for Floor Mats  Risk For Fall Injury- Criteria for Floor Mats None identified - No additional interventions needed  Vitals  Temp 97.8 F (36.6 C)  BP (!) 166/84  MAP (mmHg) 103  BP Location Right Arm  BP Method Automatic  Patient Position (if appropriate) Lying  Pulse Rate 69  Resp 18  Oxygen Therapy  SpO2 100 %  O2 Device Room Air  Pain Assessment  Pain Scale 0-10  Pain Score 0

## 2020-05-03 NOTE — Progress Notes (Signed)
Speech Language Pathology Weekly Progress and Session Note  Patient Details  Name: Xian Alves MRN: [id number] Date of Birth: 08/24/1960  Beginning of progress report period: April 26, 2020 End of progress report period: May 03, 2020  Today's Date: 05/03/2020 SLP Individual Time: 2119-4174 SLP Individual Time Calculation (min): 55 min  Short Term Goals: Week 1: SLP Short Term Goal 1 (Week 1): Pt will demonstate ability to use compensatory memory strategies for semi-complex tasks/recall with Min A verbal/visual cues. SLP Short Term Goal 1 - Progress (Week 1): Met SLP Short Term Goal 2 (Week 1): Pt will selectively attend to tasks with Min A cues for redirection. SLP Short Term Goal 2 - Progress (Week 1): Progressing toward goal SLP Short Term Goal 3 (Week 1): Pt will demonstrate ability to problem solve mildly complex to complex situations with Min A cues. SLP Short Term Goal 3 - Progress (Week 1): Met SLP Short Term Goal 4 (Week 1): Pt will detect fuctional errors with Min A cues. SLP Short Term Goal 4 - Progress (Week 1): Progressing toward goal SLP Short Term Goal 5 (Week 1): Pt will recall 2 safety precautions and/or therapy techniques with Min A cues. SLP Short Term Goal 5 - Progress (Week 1): Met    New Short Term Goals: Week 2: SLP Short Term Goal 1 (Week 2): Pt will demonstate ability to use compensatory memory strategies for semi-complex tasks/recall with Supervision A verbal/visual cues. SLP Short Term Goal 2 (Week 2): Pt will selectively attend to tasks with Min A cues for redirection. SLP Short Term Goal 3 (Week 2): Pt will demonstrate ability to problem solve mildly complex to complex situations with Min A cues. SLP Short Term Goal 4 (Week 2): Pt will detect fuctional errors with Min A cues. SLP Short Term Goal 5 (Week 2): Pt will anticipate 2 ADLs he will need assistance with at d/c and/or 2 ways to prevent falls at home with Min A cues  Weekly Progress Updates: Pt has  made functional gains and met 3 out of 5 short term goals. Pt is currently Min-Mod assist for complex tasks due to cognitive impairments primarily impacting his short term memory, sustained attention, and emergent and anticipatory awareness, as well as complex problem solving. 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 Pt has demonstrated improved problem solving and awareness this week. Pt education is ongoing; no family has been present for ST sessions to date. Pt would continue to benefit from skilled ST while inpatient in order to maximize functional independence and reduce burden of care prior to discharge. Anticipate that pt will need 24/7 supervision at discharge in addition to [place] follow up at next level of care.       Intensity: Minumum of 1-2 x/day, 30 to 90 minutes Frequency: 3 to 5 out of 7 days Duration/Length of Stay: 05/22/20 Treatment/Interventions: Cognitive remediation/compensation;Internal/external aids;Cueing hierarchy;Patient/family education;Functional tasks   Daily Session  Skilled Therapeutic Interventions: Pt was seen for skilled ST targeting cognitive goals. SLP facilitated session with a functional conversation regarding pt's recent fall in the hospital targeting safety awareness. SLP and pt discussed how reaching for items that fall out of the bed is not a safe solution given his physical limitations. Alternative solutions discussed (calling for help, rearranging items so they're more likely to stay within reach, etc.). He also expressed frustrations with his daily schedule and timing of  breakfast delivery. Therefore, mod A provided to problem solve (setting an alarm to wake up early, keeping packs of cereal at beside and calling to request milk in AM so he can control breakfast time and ensure it doesn't interfere with his therapies). SLP further  facilitated session with a semi-complex airline fight scheduling and interpretation task, during which he required overall min A verbal cues for scanning and locating items on the left, as well as problem solving strategies. Pt required Min A verbal cues for redirection to tasks throughout session. Pt left laying in bed with alarm set and needs within reach. Continue per current plan of care.           Pain Pain Assessment Pain Scale: 0-10 Pain Score: 0-No pain  Therapy/Group: Individual Therapy  Arbutus Leas 05/03/2020, 10:20 AM

## 2020-05-03 NOTE — Progress Notes (Signed)
   05/03/20 0025  What Happened  Was fall witnessed? No  Was patient injured? No  Patient found on floor  Found by Staff-comment  Stated prior activity other (comment) (in Bed trying to reach remote on floor)  Follow Up  MD notified  Reesa Chew)  Time MD notified [place] notified No - patient refusal  Additional tests No  Progress note created (see row info) Yes  Adult Fall Risk Assessment  Risk Factor Category (scoring not indicated) Fall has occurred during this admission (document High fall risk)  Age 60  Fall History: Fall within 6 months prior to admission 5  Elimination; Bowel and/or Urine Incontinence 0  Elimination; Bowel and/or Urine Urgency/Frequency 0  Medications: includes PCA/Opiates, Anti-convulsants, Anti-hypertensives, Diuretics, Hypnotics, Laxatives, Sedatives, and Psychotropics 5  Patient Care Equipment 1  Mobility-Assistance 2  Mobility-Gait 2  Mobility-Sensory Deficit 0  Altered awareness of immediate physical environment 0  Impulsiveness 0  Lack of understanding of one's physical/cognitive limitations 4  Total Score 19  Patient Fall Risk Level High fall risk  Adult Fall Risk Interventions  Required Bundle Interventions *See Row Information* High fall risk - low, moderate, and high requirements implemented  Additional Interventions HeadStart bed sensor (education/return demonstration);HeadStart chair sensor (education/return demonstration);Use of appropriate toileting equipment (bedpan, BSC, etc.);Lap belt while in chair/wheelchair (Rehab only)  Screening for Fall Injury Risk (To be completed on HIGH fall risk patients) - Assessing Need for Floor Mats  Risk For Fall Injury- Criteria for Floor Mats None identified - No additional interventions needed  Vitals  Temp 97.8 F (36.6 C)  BP (!) 166/84  MAP (mmHg) 103  BP Location Right Arm  BP Method Automatic  Patient Position (if appropriate) Lying  Pulse Rate 69  Resp 18  Oxygen Therapy  SpO2 100 %   O2 Device Room Air  Pain Assessment  Pain Scale 0-10  Pain Score 0

## 2020-05-03 NOTE — Progress Notes (Signed)
Occupational Therapy Weekly Progress Note  Patient Details  Name: Bobby Branch MRN: [id number] Date of Birth: 1960-02-23  Beginning of progress report period: April 25, 2019 End of progress report period: May 04, 2019  Today's Date: 05/03/2020 OT Individual Time: 1572-6203 OT Individual Time Calculation (min): 64 min    Patient has met 1 of 5 short term goals.  Bobby Branch is making steady progress with OT at this time, however he continues to be very limited secondary to significant LUE and LLE hemiparesis.  Currently, he is able to complete UB bathing at min assist with LB bathing at mod assist.  UB dressing is approaching min assist consistently but on occasions, can still be mod assist.  LB dressing is still at a max assist level as well as max assist for stand pivot transfers bed to wheelchair and for wheelchair to 3:1.  He continues to progress however with mod assist level squat pivot transfers to all surfaces.  Sustained attention is still limited, requiring mod instructional cueing at times to maintain his attention on selfcare tasks secondary to internal distractions. LUE hemiparesis is still prevalent with increased tone noted in the digit flexors, elbow flexors, and chest.  He currently needs max hand over hand for integration into therapy tasks as a stabilizer.  No noted active movement in seen in the UE at this time.  Recommend continued CIR level therapy at this time to progress towards min assist level goals.  Anticipated discharge is currently 4/19 with pt needing 24 hour assist for safety.    Patient continues to demonstrate the following deficits: muscle weakness and muscle paralysis, impaired timing and sequencing, abnormal tone, unbalanced muscle activation and decreased coordination, decreased initiation, decreased attention, decreased awareness, decreased problem solving and decreased safety awareness and decreased sitting balance, decreased standing balance, decreased postural  control, hemiplegia and decreased balance strategies and therefore will continue to benefit from skilled OT intervention to enhance overall performance with BADL and Reduce care partner burden.  Patient progressing toward long term goals..  Continue plan of care.  OT Short Term Goals Week 2:  OT Short Term Goal 1 (Week 2): Pt will donn a pullover shirt for two consecutive sessions with min assist. OT Short Term Goal 2 (Week 2): Pt will complete toilet transfer and toileting tasks with no more than mod assist. OT Short Term Goal 3 (Week 2): Pt will integrate the LUE into bathing tasks as a stabilizer with mod assist and mod instructional cueing. OT Short Term Goal 4 (Week 2): Pt will maintain sustained attention to selfcare tasks for 15 mins with no more than min instructional cueing for re-direction.  Skilled Therapeutic Interventions/Progress Updates:    Pt in bed to start session, completed transfer to the EOB with mod assist.  Worked on fastening brief and pulling up pants over his hips with max assist.  Mod assist was needed for squat pivot transfer to the wheelchair to the right with pt then completing oral hygiene at the sink with supervision in sitting.  Next, he removed his dirty scrub top and donned a new one with overall min assist and min instructional cueing for hemi dressing techniques.  Rolled pt down to the therapy gym where he transitioned to quadriped on the mat with max assist for increased weightbearing through the LUE.  Noted increased flexor tone in the LUE when attempting weightbearing as well some shoulder pain.  Transitioned to quadriped with UEs supported on a bench instead of the mat.  Increased pushing to the left noted at times with max facilitation needed to achieve and maintain LUE in weightbearing.  Mod assist for upright posture with pt demonstrating inability to maintain position for longer than 2 mins secondary to right hip pain.  Transitioned to sitting EOB with mod  assist and finished session with weightbearing over the LUE while reaching for targets on the left of midline with the RUE.  Min assist was needed to regain his balance.  Returned to the wheelchair with mod assist squat pivot and then back to the room.  He completed toilet transfer stand pivot with max assist to the left with max assist for clothing management in standing.  He was left with nursing to assist once toileting was completed.    Therapy Documentation Precautions:  Precautions Precautions: Fall,Other (comment) Precaution Comments: L hemiplegia, L inattention Restrictions Weight Bearing Restrictions: No  Pain: Pain Assessment Pain Scale: Faces Faces Pain Scale: Hurts little more Pain Type: Neuropathic pain Pain Location: Shoulder Pain Orientation: Left Pain Descriptors / Indicators: Discomfort Pain Onset: With Activity Pain Intervention(s): Repositioned ADL: See Care Tool Section for some details of mobility and selfcare  Therapy/Group: Individual Therapy  Nima Kemppainen OTR/L 05/03/2020, 4:27 PM

## 2020-05-03 NOTE — Progress Notes (Addendum)
Physical Therapy Session Note  Patient Details  Name: Saveon Barsamian MRN: [id number] Date of Birth: 1961/02/03  Today's Date: 05/03/2020 PT Individual Time: 0802-0815 PT Individual Time Calculation (min): 13 min (missed 37 min of therapy)  Short Term Goals: Week 2:  PT Short Term Goal 1 (Week 2): Pt will perform bed mobility min assist. PT Short Term Goal 2 (Week 2): Pt will perform bed to chair transfer mod assist. PT Short Term Goal 3 (Week 2): Pt will perform sit to stand min assist. PT Short Term Goal 4 (Week 2): Pt will ambulate >10 ft with LRAD with max assist +2.  Skilled Therapeutic Interventions/Progress Updates:    Pt received supine on bed pan with urinal. Bed alarm not turned on and HOB elevated. Pt continent of urine. When asked if he is finished defecating, pt said "yes" but requested the urinal again as he felt the urge to urinate. Pt requested therapist to step out of room while he tries to urinate. Pt checked on again after ~74mn and verbalized that he needed additional time and that PT should "come back later." Pt also frustrated about PT coming to room at breakfast time. NT notified. PT returned ~15 min later, but pt verbalized that he still wanted time to urinate. Pt again frustrated with therapy about early morning timing conflicting with breakfast. Pt denied therapy and left in supine on bed pan with urinal. RN present in room upon PT leaving.  Therapy Documentation Precautions:  Precautions Precautions: Fall,Other (comment) Precaution Comments: L hemiplegia, L inattention Restrictions Weight Bearing Restrictions: No    Therapy/Group: Individual Therapy  KEleonore Chiquito SPT 05/03/2020, 7:54 AM

## 2020-05-04 LAB — GLUCOSE, CAPILLARY
Glucose-Capillary: 111 mg/dL — ABNORMAL HIGH (ref 70–99)
Glucose-Capillary: 285 mg/dL — ABNORMAL HIGH (ref 70–99)
Glucose-Capillary: 111 mg/dL — ABNORMAL HIGH (ref 70–99)
Glucose-Capillary: 218 mg/dL — ABNORMAL HIGH (ref 70–99)

## 2020-05-04 MED ORDER — SENNOSIDES-DOCUSATE SODIUM 8.6-50 MG PO TABS
2.0000 | ORAL_TABLET | Freq: Every day | ORAL | Status: DC
  Administered 2020-05-04 – 2020-05-21 (×18): 2 via ORAL
  Filled 2020-05-04 (×22): qty 2

## 2020-05-04 NOTE — Progress Notes (Signed)
Physical Therapy Session Note  Patient Details  Name: Bobby Branch MRN: [id number] Date of Birth: [birth year]  Today's Date: 05/04/2020 PT Individual Time: 1002-1100 PT Individual Time Calculation (min): 58 min   Short Term Goals: Week 2:  PT Short Term Goal 1 (Week 2): Pt will perform bed mobility min assist. PT Short Term Goal 2 (Week 2): Pt will perform bed to chair transfer mod assist. PT Short Term Goal 3 (Week 2): Pt will perform sit to stand min assist. PT Short Term Goal 4 (Week 2): Pt will ambulate >10 ft with LRAD with max assist +2.  Skilled Therapeutic Interventions/Progress Updates:  Patient supine in bed upon PT arrival. Patient initially asleep but easily aroused and agreeable to PT session. Patient with no pain complaint throughout session. +2 available throughout session for additional assist for balance as well as for w/c follow.  Therapeutic Activity: Bed Mobility: Patient performed supine --> sit with CGA/ Min A for LLE to EOB and light Min A to pull to upright seated position on bedrail with vc. Able to scoot forward to EOB with CGA.   Transfers: Patient performed STS and SPVT transfers throughout session with Min A when provided with vc for hand positioning and when facilitating BLE positioning prior to transfer. SPVT to pt's R side are Min A and toward pt's L side are Mod A for positioning pt's L foot/ LE for appropriate pivot stepping. Provided vc/tc for positioning, and controlling descent to sit.  Gait Training:  Pt's L lower leg and foot wrapped with short stretch wrap for holding ankle into DF position to increase safety in gait. Patient ambulated 32 feet using r sided handrail in hallway with Max A for LLE advancement/ stability and for overall balance. Provided vc/ tc for sequencing of lateral weightshifting, leading LLE stepping with L knee, L foot positioning.  Neuromuscular Re-ed: NMR facilitated during session with focus on standing balance. Pt guided in LLE  extension, standing with no UE support, and completing bathing in standing with use of RUE. Pt able to stand with Mod/ Max A to maintain L knee and hip extension. +2 for safety and balance to pt's R side. Pt is able to stand for >107mnutes with one instance of knee buckle requiring Max A to correct. Second standing bout at 372mutes with RUE support and CGA for L knee block. Heavy lean to pt's R side  NMR performed for improvements in motor control and coordination, balance, sequencing, judgement, and self confidence/ efficacy in performing all aspects of mobility at highest level of independence.   Patient supine in bed at end of session with brakes locked, bed alarm set, and all needs within reach.     Therapy Documentation Precautions:  Precautions Precautions: Fall,Other (comment) Precaution Comments: L hemiplegia, L inattention Restrictions Weight Bearing Restrictions: No  Therapy/Group: Individual Therapy  JuAlger SimonsT, DPT 05/04/2020, 2:06 PM

## 2020-05-04 NOTE — Progress Notes (Signed)
Speech Language Pathology Daily Session Note  Patient Details  Name: Tywaun Troup MRN: [id number] Date of Birth: 1960-10-28  Today's Date: 05/04/2020 SLP Individual Time: 40-1430 SLP Individual Time Calculation (min): 37 min  Short Term Goals: Week 2: SLP Short Term Goal 1 (Week 2): Pt will demonstate ability to use compensatory memory strategies for semi-complex tasks/recall with Supervision A verbal/visual cues. SLP Short Term Goal 2 (Week 2): Pt will selectively attend to tasks with Min A cues for redirection. SLP Short Term Goal 3 (Week 2): Pt will demonstrate ability to problem solve mildly complex to complex situations with Min A cues. SLP Short Term Goal 4 (Week 2): Pt will detect fuctional errors with Min A cues. SLP Short Term Goal 5 (Week 2): Pt will anticipate 2 ADLs he will need assistance with at d/c and/or 2 ways to prevent falls at home with Min A cues  Skilled Therapeutic Interventions: Pt was seen for skilled ST targeting cognitive skills. Pt completed a semi-complex monthly calendar/scheduling task with overall Min A verbal and visual cues for error awareness and problem solving. He was much more attentive to task and more appropriate with conversational topics today, with Supervision A verbal cues for redirection. Pt left laying in bed with alarm set and needs within reach. Continue per current plan of care.          Pain Pain Assessment Pain Scale: 0-10 Pain Score: 0-No pain  Therapy/Group: Individual Therapy  Arbutus Leas 05/04/2020, 9:23 AM

## 2020-05-04 NOTE — Progress Notes (Signed)
Patient ID: Bobby Branch, male   DOB: [birth year], 60 y.o.   MRN: [id number] Follow up meeting with the patient to review questions, concerns re medications and dietary modifications recommended to manage secondary stroke risks. Patient reports he does not have any questions but is waiting on his girlfriend to come in tomorrow. Continue IVF; patient noted he is glad he does not need potassium in the IVF as "it burns". Noted nurse encouraging fluids but he feels like it is going right through him. Continue to follow along to discharge estimated for 05/22/20. Margarito Liner

## 2020-05-04 NOTE — Progress Notes (Signed)
Occupational Therapy Session Note  Patient Details  Name: Bobby Branch MRN: [id number] Date of Birth: [birth year]  Today's Date: 05/04/2020 OT Individual Time: 1430-1506 OT Individual Time Calculation (min): 36 min    Short Term Goals: Week 2:  OT Short Term Goal 1 (Week 2): Pt will donn a pullover shirt for two consecutive sessions with min assist. OT Short Term Goal 2 (Week 2): Pt will complete toilet transfer and toileting tasks with no more than mod assist. OT Short Term Goal 3 (Week 2): Pt will integrate the LUE into bathing tasks as a stabilizer with mod assist and mod instructional cueing. OT Short Term Goal 4 (Week 2): Pt will maintain sustained attention to selfcare tasks for 15 mins with no more than min instructional cueing for re-direction.  Skilled Therapeutic Interventions/Progress Updates:    Pt in bed to start session.  Began with AAROM/stretching of the left shoulder while in supine.  Pt with some slight increased tone in the left elbow flexors as well as left digit flexors.  He was able to tolerate ROM shoulder flexion to approximately 90 degrees with then report of pain.  Attempted PROM external rotation with slight flexion as well, but he reported increased pain, so could not tolerate it.  Transitioned to sitting and applied NMES to the dorsal forearm for facilitation of digit extensors.  Intensity set on level 30 with PPS at 35 and pulse width at 300.  On/off time 10 seconds with 2 second ramp.  Had pt work on functional reach along with stimulation of the digit extension for picking up up a paper towel.  Max hand over hand was needed for facilitation of elbow extension and shoulder flexion.  He tolerated 15 mins of active stimulation without any adverse reactions.  Finished session with transition from sitting to supine with mod assist.  Resting hand splint placed on the LUE for support.  Call button and phone in reach with safety alarm in place.    Therapy  Documentation Precautions:  Precautions Precautions: Fall,Other (comment) Precaution Comments: L hemiplegia, L inattention Restrictions Weight Bearing Restrictions: No  Pain: Pain Assessment Pain Scale: Faces Pain Score: 0-No pain Faces Pain Scale: Hurts a little bit Pain Type: Acute pain;Neuropathic pain Pain Location: Shoulder Pain Orientation: Left Pain Descriptors / Indicators: Discomfort Pain Onset: With Activity Pain Intervention(s): Repositioned ADL: See Care Tool Section for some details of mobility and selfcare  Therapy/Group: Individual Therapy  Geralynn Capri OTR/L 05/04/2020, 3:42 PM

## 2020-05-04 NOTE — Consult Note (Signed)
Neuropsychological Consultation   Patient:   Bobby Branch   DOB:   Jul 09, 1960  MR Number:  [id number]  Location:  [facility] [address] [facility] [address] [place] [id number] [place] [address] Dept: [place]: [phone]           Date of Service:   05/04/2020  Start Time:   11 AM End Time:   12 PM  Provider/Observer:  Ilean Skill, Psy.D.       Clinical Neuropsychologist       Billing Code/Service: [id number]  Chief Complaint:    Bobby Branch is a 60 year old male with history of prior CVA in August 2021.  Patient suffered a basal ganglia stroke with mild residual deficits and was maintained on aspirin and Plavix.  Patient also with diabetes as well as chronic kidney disease stage III, hypertension, and hyperlipidemia.  Patient had been living with his girlfriend and was modified independent.  Patient recently presented on 04/20/2020 with acute onset of left-sided weakness.  Patient reports that he had been having some difficulties just prior to significant left-sided weakness.  Cranial CT scan showed asymmetric hyperdensity involving the right M2/M3 branches concerning for thrombosis.  There were also a few remote lacunar infarcts identified in bilateral basal ganglia regions.  MRI follow-up showed right MCA patchy small acute infarct with possible petechial hemorrhage.  Urine drug screen was positive for cocaine as well as marijuana.  Patient with ongoing significant left hemiparesis that also impacts expressive language primary due to motor functions.  Reason for Service:  Patient was referred for neuropsychological consultation due to coping and adjustment issues and concerns about prior substance abuse and the impact on risk for further stroke events.  Below is the HPI for the current admission.  HPI: Bobby Branch is a 60 year old right-handed male with history of prior CVA August 2021 with very mild residual deficits  maintained on aspirin and Plavix, diabetes mellitus, CKD stage III, hypertension and hyperlipidemia.  Per chart review lives with his girlfriend and modified independent.    Presented 04/20/2020 with acute onset of left-sided weakness.  Cranial CT scan showed asymmetric hyperdensity involving the right M2/M3 branches concerning for thrombus.  Few remote lacunar infarcts about the bilateral basal ganglia.  CT angiogram of head and neck as well as CT cerebral perfusion scan showed acute 15 cc core infarct involving the posterior right MCA distribution with a 67 cc surrounding ischemic penumbra.  Patient underwent TICI 3 right MCA revascularization per interventional radiology.  MRI follow-up showed right MCA patchy small acute infarct with possible petechial hemorrhage.  Carotid Dopplers with right 60 to 79% left ICA 80 to 99% stenosis.  Echocardiogram with ejection fraction of 55 to 60% no wall motion abnormalities grade 1 diastolic dysfunction.  Admission chemistries glucose 231 creatinine 2.10 hemoglobin 13.6 hemoglobin A1c 7.3 urine drug screen positive cocaine as well as marijuana.  Initially maintained on Cleviprex for blood pressure control.  Neurology follow-up presently on aspirin 325 mg daily and Plavix 75 mg daily x3 months.  In regards to patient's bilateral carotid stenosis Dr. Earleen Newport of interventional radiology to follow-up outpatient for potential revascularization treatment.  He is tolerating a regular consistency diet.  Therapy evaluations completed due to patient's left-sided weakness recommendations of physical medicine rehab consult patient was admitted for a comprehensive rehab program. He currently complains of severe left sided weakness.   Current Status:  Patient was awake and oriented and was getting a heating pad put  on his left shoulder which she reported he was having some pain with.  Patient continues with severe motor deficits on his left side and some dysarthria and expressive language  changes almost completely related to motor function changes.  Patient was able to describe events leading up to his stroke and always maintain awareness and consciousness throughout event.  Patient also was able to talk about his previous stroke as well.  Patient made some attempts to minimize or acknowledged prior substance use.  Patient acknowledged some emotional response to sudden loss of function but denied that his depressive response was leading to the level that would keep him from being able to actively participate in therapeutic interventions.  Depression was not assessed as severe but more of an appropriate emotional response given circumstances.  Behavioral Observation: Bobby Branch  presents as a 60 y.o.-year-old Right African American Male who appeared his stated age. his dress was Appropriate and he was Well Groomed and his manners were Appropriate to the situation.  his participation was indicative of Appropriate and Attentive behaviors.  There were physical disabilities noted.  he displayed an appropriate level of cooperation and motivation.     Interactions:    Active Appropriate  Attention:   within normal limits and attention span and concentration were age appropriate  Memory:   within normal limits; recent and remote memory intact  Visuo-spatial:  not examined  Speech (Volume):  low  Speech:   normal; slurred  Thought Process:  Coherent and Relevant  Though Content:  WNL; not suicidal and not homicidal  Orientation:   person, place, time/date and situation  Judgment:   Fair  Planning:   Fair  Affect:    Appropriate  Mood:    Dysphoric  Insight:   Good  Intelligence:   normal  Substance Use:  There is a documented history of cocaine and marijuana abuse confirmed by Urine drug screen at admission.    Medical History:   Past Medical History:  Diagnosis Date  . [facility] 09/2019         Patient Active Problem List   Diagnosis Date Noted  . Slow  transit constipation   . Spastic hemiplegia affecting nondominant side ([place])   . Stage 3b chronic kidney disease ([place])   . Essential hypertension   . Controlled type 2 diabetes mellitus with hyperglycemia, without long-term current use of insulin ([place])   . Protein-calorie malnutrition, severe 04/25/2020  . 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 Acute right MCA stroke ([place]) 04/20/2020    Psychiatric History:  No prior psychiatric history although there is a history of substance abuse  Family Med/Psych History: History reviewed. No pertinent family history.   Impression/DX:  Bobby Branch is a 60 year old male with history of prior CVA in August 2021.  Patient suffered a basal ganglia stroke with mild residual deficits and was maintained on aspirin and Plavix.  Patient also with diabetes as well as chronic kidney disease stage III, hypertension, and hyperlipidemia.  Patient had been living with his girlfriend and was modified independent.  Patient recently presented on 04/20/2020 with acute onset of left-sided weakness.  Patient reports that he had been having some difficulties just prior to significant left-sided weakness.  Cranial CT scan showed asymmetric hyperdensity involving the right M2/M3 branches concerning for thrombosis.  There were also a few remote lacunar infarcts identified in bilateral basal ganglia regions.  MRI follow-up showed right MCA patchy small acute infarct with possible petechial hemorrhage.  Urine drug screen was positive for cocaine as well as marijuana.  Patient with ongoing significant left hemiparesis that also impacts expressive language primary due to motor functions.  Patient was awake and oriented and was getting a heating pad put on his left shoulder which she reported he was having some pain with.  Patient continues with severe motor deficits on his left side and some dysarthria and expressive language changes almost completely related to motor  function changes.  Patient was able to describe events leading up to his stroke and always maintain awareness and consciousness throughout event.  Patient also was able to talk about his previous stroke as well.  Patient made some attempts to minimize or acknowledged prior substance use.  Patient acknowledged some emotional response to sudden loss of function but denied that his depressive response was leading to the level that would keep him from being able to actively participate in therapeutic interventions.  Depression was not assessed as severe but more of an appropriate emotional response given circumstances.  Disposition/Plan:  Today we worked on coping and adjustment issues around residual effects of his right MCA stroke affecting primarily M2/M3 branches.  Patient with residual motor and tactile changes.  Diagnosis:    Right middle cerebral artery stroke [facility]) - Plan: Ambulatory referral to Neurology  Chronic coughing - Plan: DG Chest 2 View, DG Chest 2 View         Electronically Signed   _______________________ Ilean Skill, Psy.D. Clinical Neuropsychologist

## 2020-05-05 LAB — GLUCOSE, CAPILLARY
Glucose-Capillary: 103 mg/dL — ABNORMAL HIGH (ref 70–99)
Glucose-Capillary: 132 mg/dL — ABNORMAL HIGH (ref 70–99)
Glucose-Capillary: 163 mg/dL — ABNORMAL HIGH (ref 70–99)
Glucose-Capillary: 190 mg/dL — ABNORMAL HIGH (ref 70–99)
Glucose-Capillary: 219 mg/dL — ABNORMAL HIGH (ref 70–99)
Glucose-Capillary: 230 mg/dL — ABNORMAL HIGH (ref 70–99)

## 2020-05-05 NOTE — Progress Notes (Signed)
Physical Therapy Session Note  Patient Details  Name: Bobby Branch MRN: [id number] Date of Birth: October 28, 1960  Today's Date: 05/05/2020 PT Individual Time: 1000-1054 PT Individual Time Calculation (min): 54 min   Short Term Goals: Week 2:  PT Short Term Goal 1 (Week 2): Pt will perform bed mobility min assist. PT Short Term Goal 2 (Week 2): Pt will perform bed to chair transfer mod assist. PT Short Term Goal 3 (Week 2): Pt will perform sit to stand min assist. PT Short Term Goal 4 (Week 2): Pt will ambulate >10 ft with LRAD with max assist +2.  Skilled Therapeutic Interventions/Progress Updates:    Pt greeted supine in bed at start of session, no reports of pain. Retrieved scrub pants/shirt as pt undressed on arrival. Required modA for donning pants while supine in bed. Supine<>sit with minA for LLE and trunk assist to upright. Requires minA for donning shirt with good ability to initiate hemi technique for dressing. Stand<>pivot transfer with min/modA from EOB to w/c, L knee block and cues for sequencing and technique. W/c transport for time management during session. Completed car transfer with modA for entering car and minA for leaving car, needing assistance as well for BLE management to clear threshold. Car height set to simulate his girlfriends smaller sedan (pt unsure which make/model). Completed gait training 2x72f with modA and R hand rail and +2 assist for chair follow for safety. Completed via 3-muskateer technique with therapist supporting under L axilla and facilitating trunk extension and needing totalA for LLE management (including swing, stance, and knee blocking). Pt with good ability to understand general sequencing during gait but needing mod cues for initiation during gait as he shows blocked movement patterns. NMR with repeated sit<>stands and L knee block, with mostly minA, task overlay for each stand for hanging horsehose on top of mirror placed on his L side to encourage LLE  weight bearing and over midline reaching. To encourage participation, allowed patient to shoot basketball in b/w sets (pt reports he was an avid basketball player before) - pt enjoyed this and improved morale. W/c transport back to his room and pt requesting to return to bed. Stand<>pivot with minA and L knee block from w/c to EOB and needing miNA for sit>supine for LLE management. Remained supine in bed at end of session with needs in reach and bed alarm on. Pt requesting therapist to don his WHO - completed per request.   Therapy Documentation Precautions:  Precautions Precautions: Fall,Other (comment) Precaution Comments: L hemiplegia, L inattention Restrictions Weight Bearing Restrictions: No General:    Therapy/Group: Individual Therapy  Idara [place] P Maisen Klingler PT 05/05/2020, 7:39 AM

## 2020-05-05 NOTE — Progress Notes (Addendum)
PROGRESS NOTE   Subjective/Complaints:  Patient reports he slept well his bowels are working "okay".  He also notes his IV fluids "ran out a while ago".  Per the chart his IV fluids ended the morning of 4/1.    ROS:  Pt denies SOB, abd pain, CP, N/V/C/D, and vision changes   Objective:   No results found. No results for input(s): WBC, HGB, HCT, PLT in the last 72 hours. Recent Labs    05/03/20 0456  NA 140  K 4.3  CL 102  CO2 33*  GLUCOSE 175*  BUN 26*  CREATININE 2.43*  CALCIUM 8.9    Intake/Output Summary (Last 24 hours) at 05/05/2020 1604 Last data filed at 05/05/2020 0600 Gross per 24 hour  Intake 1126.34 ml  Output 1000 ml  Net 126.34 ml        Physical Exam: Vital Signs Blood pressure (!) 141/74, pulse (!) 56, temperature 98 F (36.7 C), resp. rate 18, height '5\' 6"'$  (1.676 m), weight 49.2 kg, SpO2 100 %.   General: awake, alert, appropriate, NAD HENT: conjugate gaze; oropharynx moist CV: Bradycardic rate; no JVD Pulmonary: CTA B/L; no W/R/R- good air movement GI: soft, NT, ND, (+)BS, hypoactive Psychiatric: appropriate- sleepy; laying with sheet covering his face/body Neurological: Ox3 Skin: warm, dry Motor:  RUE/RLE: 5/5 proximal distal  LUE/LLE: 0/5 proximal distal Increased tone noted in left upper extremity   Assessment/Plan: 1. Functional deficits which require 3+ hours per day of interdisciplinary therapy in a comprehensive inpatient rehab setting.  Physiatrist is providing close team supervision and 24 hour management of active medical problems listed below.  Physiatrist and rehab team continue to assess barriers to discharge/monitor patient progress toward functional and medical goals  Care Tool:  Bathing    Body parts bathed by patient: Left arm,Chest,Abdomen,Front perineal area,Right lower leg,Right upper leg,Left upper leg,Face   Body parts bathed by helper: Left lower  leg,Buttocks,Right arm     Bathing assist Assist Level: Moderate Assistance - Patient 50 - 74%     Upper Body Dressing/Undressing Upper body dressing   What is the patient wearing?: Pull over shirt    Upper body assist Assist Level: Minimal Assistance - Patient > 75%    Lower Body Dressing/Undressing Lower body dressing      What is the patient wearing?: Pants     Lower body assist Assist for lower body dressing: Maximal Assistance - Patient 25 - 49%     Toileting Toileting    Toileting assist Assist for toileting: Moderate Assistance - Patient 50 - 74%     Transfers Chair/bed transfer  Transfers assist     Chair/bed transfer assist level: Moderate Assistance - Patient 50 - 74% (squat pivot)     Locomotion Ambulation   Ambulation assist   Ambulation activity did not occur: Safety/medical concerns  Assist level: Total Assistance - Patient < 25% Assistive device: Lite Gait Max distance: 754f (Lite Gait)   Walk 10 feet activity   Assist  Walk 10 feet activity did not occur: Safety/medical concerns        Walk 50 feet activity   Assist Walk 50 feet with 2 turns activity did  not occur: Safety/medical concerns         Walk 150 feet activity   Assist Walk 150 feet activity did not occur: Safety/medical concerns         Walk 10 feet on uneven surface  activity   Assist Walk 10 feet on uneven surfaces activity did not occur: Safety/medical concerns         Wheelchair     Assist Will patient use wheelchair at discharge?: Yes (Per PT long term goals) Type of Wheelchair: Manual           Wheelchair 50 feet with 2 turns activity    Assist    Wheelchair 50 feet with 2 turns activity did not occur: Safety/medical concerns       Wheelchair 150 feet activity     Assist  Wheelchair 150 feet activity did not occur: Safety/medical concerns       Blood pressure (!) 141/74, pulse (!) 56, temperature 98 F (36.7 C),  resp. rate 18, height '5\' 6"'$  (1.676 m), weight 49.2 kg, SpO2 100 %. Medical Problem List and Plan: 1.  Left-sided hemiplegia, now with spasticity secondary to right MCA distribution infarction with right M2/3 occlusion status post thrombectomy revascularization as well as history of CVA August 2021 with very mild residual deficits  4/2-continue PT and OT  WHO/PRAFO at bedtime 2.  Impaired mobility: -DVT/anticoagulation: Conitnue SCDs             -antiplatelet therapy: Continue Aspirin 325 mg daily and Plavix 75 mg daily x3 months 3. Left shoulder pain: Tylenol as needed. Add voltaren gel and lidocaine patch.   4/2-patient reports pain is controlled-continue regimen 4. Depression: Continue Lexapro 10 mg daily             -antipsychotic agents: N/A 5. Neuropsych: This patient is capable of making decisions on his own behalf. 6. Skin/Wound Care: Routine skin checks 7. Fluids/Electrolytes/Nutrition: Routine in and outs 8.  Hypertension.  BP has been labile- continue Norvasc 10 mg daily.  Monitor with increased mobility 4/2-blood pressure is somewhat labile; continue Norvasc daily Vitals:   05/04/20 2006 05/05/20 0418  BP: (!) 166/86 (!) 141/74  Pulse: 82 (!) 56  Resp: 16 18  Temp: 98.2 F (36.8 C) 98 F (36.7 C)  SpO2: 99% 100%   9.  Diabetes mellitus with hyperglycemia.  Hemoglobin A1c 7.3.  SSI.  Patient on Glucophage 500 mg twice daily prior to admission.  Resume as needed CBG (last 3)  Recent Labs    05/05/20 0416 05/05/20 0824 05/05/20 1142  GLUCAP 190* 132* 103*   Am CBG ok elevated last evening  Pt states he has "problems with blood vessels in eyes and was told he needs to keep CBGs normal  4/2 - BGS running from 103 to 218 in the last 24 hours-we will restart Glucophage low-dose twice a day, per home dose regimen-cannot restart Glucophage due to patient's creatinine; will continue sliding scale insulin 10.  Hyperlipidemia: Lipitor 11.  Polysubstance abuse.  Urine drug screen  positive cocaine as well as marijuana.  ask neuropsych to provide  Counseling, pt plans to attend NA as OP   Tobacco abuse added nicoderm patch 78mg 12.  CKD stage III.    Creatinine 1.95 on 3/22,up to 2.44 on 3/28, 2.43 on 3/31 will give IVF qhs  4/2-we will recheck a BMP-on Monday 13. 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 Scheduled at  11pm as per patient's request. Will not increase during day due to grogginess this morning.  15.  Slow transit constipation  Bowel meds increased on 3/27- has required 2 enemas during rehab admit  16.  CHronic cough, smoker , check CXR- prn inhaler   LOS: 12 days A FACE TO FACE EVALUATION WAS PERFORMED  Bobby Branch 05/05/2020, 4:04 PM

## 2020-05-06 LAB — GLUCOSE, CAPILLARY
Glucose-Capillary: 138 mg/dL — ABNORMAL HIGH (ref 70–99)
Glucose-Capillary: 148 mg/dL — ABNORMAL HIGH (ref 70–99)
Glucose-Capillary: 165 mg/dL — ABNORMAL HIGH (ref 70–99)
Glucose-Capillary: 177 mg/dL — ABNORMAL HIGH (ref 70–99)
Glucose-Capillary: 191 mg/dL — ABNORMAL HIGH (ref 70–99)
Glucose-Capillary: 200 mg/dL — ABNORMAL HIGH (ref 70–99)

## 2020-05-06 MED ORDER — INSULIN ASPART 100 UNIT/ML ~~LOC~~ SOLN
0.0000 [IU] | Freq: Every day | SUBCUTANEOUS | Status: DC
  Administered 2020-05-08 – 2020-05-13 (×3): 2 [IU] via SUBCUTANEOUS
  Administered 2020-05-16: 4 [IU] via SUBCUTANEOUS
  Administered 2020-05-17 – 2020-05-20 (×3): 2 [IU] via SUBCUTANEOUS

## 2020-05-06 MED ORDER — INSULIN ASPART 100 UNIT/ML ~~LOC~~ SOLN
0.0000 [IU] | Freq: Three times a day (TID) | SUBCUTANEOUS | Status: DC
  Administered 2020-05-06: 1 [IU] via SUBCUTANEOUS
  Administered 2020-05-07 – 2020-05-08 (×2): 2 [IU] via SUBCUTANEOUS
  Administered 2020-05-09 – 2020-05-10 (×2): 1 [IU] via SUBCUTANEOUS
  Administered 2020-05-10 – 2020-05-11 (×3): 2 [IU] via SUBCUTANEOUS
  Administered 2020-05-11: 1 [IU] via SUBCUTANEOUS
  Administered 2020-05-12: 2 [IU] via SUBCUTANEOUS
  Administered 2020-05-13 – 2020-05-15 (×3): 1 [IU] via SUBCUTANEOUS
  Administered 2020-05-16: 3 [IU] via SUBCUTANEOUS
  Administered 2020-05-16: 1 [IU] via SUBCUTANEOUS
  Administered 2020-05-17 (×2): 2 [IU] via SUBCUTANEOUS
  Administered 2020-05-18 (×2): 1 [IU] via SUBCUTANEOUS
  Administered 2020-05-19: 2 [IU] via SUBCUTANEOUS
  Administered 2020-05-19 – 2020-05-21 (×2): 1 [IU] via SUBCUTANEOUS
  Administered 2020-05-21: 2 [IU] via SUBCUTANEOUS
  Administered 2020-05-22: 1 [IU] via SUBCUTANEOUS

## 2020-05-06 MED ORDER — LINAGLIPTIN 5 MG PO TABS
5.0000 mg | ORAL_TABLET | Freq: Every day | ORAL | Status: DC
  Administered 2020-05-07 – 2020-05-16 (×10): 5 mg via ORAL
  Filled 2020-05-06 (×11): qty 1

## 2020-05-06 NOTE — Plan of Care (Signed)
  Problem: RH BLADDER ELIMINATION Goal: RH STG MANAGE BLADDER WITH ASSISTANCE Description: STG Manage Bladder With Mod I Assistance Outcome: Not Progressing; incontinent at times

## 2020-05-06 NOTE — Progress Notes (Signed)
Speech Language Pathology Daily Session Note  Patient Details  Name: Bobby Branch MRN: [id number] Date of Birth: 1960-09-17  Today's Date: 05/06/2020 SLP Individual Time: 0918-1000 SLP Individual Time Calculation (min): 42 min  Short Term Goals: Week 2: SLP Short Term Goal 1 (Week 2): Pt will demonstate ability to use compensatory memory strategies for semi-complex tasks/recall with Supervision A verbal/visual cues. SLP Short Term Goal 2 (Week 2): Pt will selectively attend to tasks with Min A cues for redirection. SLP Short Term Goal 3 (Week 2): Pt will demonstrate ability to problem solve mildly complex to complex situations with Min A cues. SLP Short Term Goal 4 (Week 2): Pt will detect fuctional errors with Min A cues. SLP Short Term Goal 5 (Week 2): Pt will anticipate 2 ADLs he will need assistance with at d/c and/or 2 ways to prevent falls at home with Min A cues  Skilled Therapeutic Interventions: Pt seen for skilled ST with focus on cognitive goals, girlfriend present throughout. SLP facilitating moderately complex problem solving task related to safety awareness (following medication routine, home routine schedule) by providing min-mod verbal cues for redirection to task to increase topic maintenance and increase efficiency. Pt able to recall 3/9 medications independently, min cues increased to 7/9. Pt expressing satisfaction with new daily schedule and ability to manage breakfast meal and therapy timing. Pt and girlfriend state pt appears to be at/approaching baseline for cognition and speech (new baseline from previous CVA 2021). Pt will benefit from ongoing education/training for safety awareness and reasoning skills. Pt left in bed with alarm set and all needs met. Cont ST POC.   Pain Pain Assessment Pain Scale: 0-10 Pain Score: 3  Pain Location: Shoulder Pain Intervention(s): Repositioned  Therapy/Group: Individual Therapy  Bobby Branch 05/06/2020, 11:29 AM

## 2020-05-06 NOTE — Progress Notes (Signed)
Patient refuses tradjenta claims he needs to read side effects before taking it. Hand out given for Tradjenta.

## 2020-05-06 NOTE — Progress Notes (Signed)
PROGRESS NOTE   Subjective/Complaints:  Pt reports his LBM was 2 days ago- plans to take Miralax today to go.   Pt also worried about his BGs being higher- said they gave him Metformin-and he wants to restart Metformin.  Due to pt's elevated Cr, don't feel comfortable starting Metformin again, however, pt doesn't want Lantus/insulin.   Pharmacy agrees with me about not starting metformin or sulfaurea.   Will start Tradjenta 5 mg daily for now and monitor.      ROS:   Pt denies SOB, abd pain, CP, N/V/C/D, and vision changes   Objective:   No results found. No results for input(s): WBC, HGB, HCT, PLT in the last 72 hours. No results for input(s): NA, K, CL, CO2, GLUCOSE, BUN, CREATININE, CALCIUM in the last 72 hours.  Intake/Output Summary (Last 24 hours) at 05/06/2020 1432 Last data filed at 05/06/2020 0720 Gross per 24 hour  Intake 1850.71 ml  Output 1250 ml  Net 600.71 ml        Physical Exam: Vital Signs Blood pressure (!) 142/79, pulse 71, temperature 98 F (36.7 C), temperature source Oral, resp. rate 18, height '5\' 6"'$  (1.676 m), weight 49.2 kg, SpO2 100 %.    General: awake, alert, appropriate, more awake, on phone, NAD HENT: conjugate gaze; oropharynx moist CV: regular rate; no JVD Pulmonary: CTA B/L; no W/R/R- good air movement GI: soft, NT, ND, (+)BS- hypoactive Psychiatric: appropriate- interactive; asking about BGs Neurological: Ox3 Skin: warm, dry Motor:  RUE/RLE: 5/5 proximal distal  LUE/LLE: 0/5 proximal distal Increased tone noted in left upper extremity   Assessment/Plan: 1. Functional deficits which require 3+ hours per day of interdisciplinary therapy in a comprehensive inpatient rehab setting.  Physiatrist is providing close team supervision and 24 hour management of active medical problems listed below.  Physiatrist and rehab team continue to assess barriers to discharge/monitor patient  progress toward functional and medical goals  Care Tool:  Bathing    Body parts bathed by patient: Left arm,Chest,Abdomen,Front perineal area,Right lower leg,Right upper leg,Left upper leg,Face   Body parts bathed by helper: Left lower leg,Buttocks,Right arm     Bathing assist Assist Level: Moderate Assistance - Patient 50 - 74%     Upper Body Dressing/Undressing Upper body dressing   What is the patient wearing?: Pull over shirt    Upper body assist Assist Level: Minimal Assistance - Patient > 75%    Lower Body Dressing/Undressing Lower body dressing      What is the patient wearing?: Pants     Lower body assist Assist for lower body dressing: Maximal Assistance - Patient 25 - 49%     Toileting Toileting    Toileting assist Assist for toileting: Moderate Assistance - Patient 50 - 74%     Transfers Chair/bed transfer  Transfers assist     Chair/bed transfer assist level: Moderate Assistance - Patient 50 - 74% (squat pivot)     Locomotion Ambulation   Ambulation assist   Ambulation activity did not occur: Safety/medical concerns  Assist level: Total Assistance - Patient < 25% Assistive device: Lite Gait Max distance: 739f (Lite Gait)   Walk 10 feet activity  Assist  Walk 10 feet activity did not occur: Safety/medical concerns        Walk 50 feet activity   Assist Walk 50 feet with 2 turns activity did not occur: Safety/medical concerns         Walk 150 feet activity   Assist Walk 150 feet activity did not occur: Safety/medical concerns         Walk 10 feet on uneven surface  activity   Assist Walk 10 feet on uneven surfaces activity did not occur: Safety/medical concerns         Wheelchair     Assist Will patient use wheelchair at discharge?: Yes (Per PT long term goals) Type of Wheelchair: Manual           Wheelchair 50 feet with 2 turns activity    Assist    Wheelchair 50 feet with 2 turns activity  did not occur: Safety/medical concerns       Wheelchair 150 feet activity     Assist  Wheelchair 150 feet activity did not occur: Safety/medical concerns       Blood pressure (!) 142/79, pulse 71, temperature 98 F (36.7 C), temperature source Oral, resp. rate 18, height '5\' 6"'$  (1.676 m), weight 49.2 kg, SpO2 100 %. Medical Problem List and Plan: 1.  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  Impaired mobility: -DVT/anticoagulation: Conitnue SCDs             -antiplatelet therapy: Continue Aspirin 325 mg daily and Plavix 75 mg daily x3 months 3. Left shoulder pain: Tylenol as needed. Add voltaren gel and lidocaine patch.   4/2-patient reports pain is controlled-continue regimen 4. Depression: Continue Lexapro 10 mg daily             -antipsychotic agents: N/A 5. Neuropsych: This patient is capable of making decisions on his own behalf. 6. Skin/Wound Care: Routine skin checks 7. Fluids/Electrolytes/Nutrition: Routine in and outs 8.  Hypertension.  BP has been labile- continue Norvasc 10 mg daily.  Monitor with increased mobility 4/2-blood pressure is somewhat labile; continue Norvasc daily 4/3-blood pressure is running just a little high at 140s over 70s-continue regimen for now Vitals:   05/06/20 0415 05/06/20 1350  BP: (!) 143/81 (!) 142/79  Pulse: (!) 55 71  Resp: 16 18  Temp: 97.8 F (36.6 C) 98 F (36.7 C)  SpO2: 99% 100%   9.  Diabetes mellitus with hyperglycemia.  Hemoglobin A1c 7.3.  SSI.  Patient on Glucophage 500 mg twice daily prior to admission.  Resume as needed CBG (last 3)  Recent Labs    05/06/20 0001 05/06/20 0413 05/06/20 1138  GLUCAP 191* 138* 200*   Am CBG ok elevated last evening  Pt states he has "problems with  blood vessels in eyes and was told he needs to keep CBGs normal  4/2 - BGS running from 103 to 218 in the last 24 hours-we will restart Glucophage low-dose twice a day, per home dose regimen-cannot restart Glucophage due to patient's creatinine; will continue sliding scale insulin  4/3-patient's blood sugars are much more labile, in the 200s-patient wants to restart Metformin however I am concerned due to his GFR of around 30-also spoke with pharmacy who agrees-Will start Tradjenta 5 mg daily and monitor 10.  Hyperlipidemia: Lipitor  11.  Polysubstance abuse.  Urine drug screen positive cocaine as well as marijuana.  ask neuropsych to provide  Counseling, pt plans to attend NA as OP   Tobacco abuse added nicoderm patch 44mg 12.  CKD stage III.    Creatinine 1.95 on 3/22,up to 2.44 on 3/28, 2.43 on 3/31 will give IVF qhs  4/2-we will recheck a BMP-on Monday  4/3-ordered labs for Monday 13. Anemia:   Hemoglobin 12.7 on 3/22  4/3- CBC with diff ordered for tomorrow.  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 Scheduled at 11pm as per patient's request. Will not increase during day due to grogginess this morning.  15.  Slow transit constipation  Bowel meds increased on 3/27- has required 2 enemas during rehab admit  16.  CHronic cough, smoker , check CXR- prn inhaler   LOS: 13 days A FACE TO FACE EVALUATION WAS PERFORMED  Bobby Branch 05/06/2020, 2:32 PM

## 2020-05-07 LAB — CBC WITH DIFFERENTIAL/PLATELET
Abs Immature Granulocytes: 0.03 10*3/uL (ref 0.00–0.07)
Basophils Absolute: 0.1 10*3/uL (ref 0.0–0.1)
Basophils Relative: 1 %
Eosinophils Absolute: 0.3 10*3/uL (ref 0.0–0.5)
Eosinophils Relative: 4 %
HCT: 36.4 % — ABNORMAL LOW (ref 39.0–52.0)
Hemoglobin: 12.4 g/dL — ABNORMAL LOW (ref 13.0–17.0)
Immature Granulocytes: 0 %
Lymphocytes Relative: 30 %
Lymphs Abs: 2.4 10*3/uL (ref 0.7–4.0)
MCH: 31.4 pg (ref 26.0–34.0)
MCHC: 34.1 g/dL (ref 30.0–36.0)
MCV: 92.2 fL (ref 80.0–100.0)
Monocytes Absolute: 0.6 10*3/uL (ref 0.1–1.0)
Monocytes Relative: 8 %
Neutro Abs: 4.5 10*3/uL (ref 1.7–7.7)
Neutrophils Relative %: 57 %
Platelets: 366 10*3/uL (ref 150–400)
RBC: 3.95 MIL/uL — ABNORMAL LOW (ref 4.22–5.81)
RDW: 12.8 % (ref 11.5–15.5)
WBC: 7.9 10*3/uL (ref 4.0–10.5)
nRBC: 0 % (ref 0.0–0.2)

## 2020-05-07 LAB — GLUCOSE, CAPILLARY
Glucose-Capillary: 166 mg/dL — ABNORMAL HIGH (ref 70–99)
Glucose-Capillary: 103 mg/dL — ABNORMAL HIGH (ref 70–99)
Glucose-Capillary: 161 mg/dL — ABNORMAL HIGH (ref 70–99)
Glucose-Capillary: 115 mg/dL — ABNORMAL HIGH (ref 70–99)
Glucose-Capillary: 156 mg/dL — ABNORMAL HIGH (ref 70–99)

## 2020-05-07 LAB — BASIC METABOLIC PANEL
Anion gap: 6 (ref 5–15)
BUN: 19 mg/dL (ref 6–20)
CO2: 25 mmol/L (ref 22–32)
Calcium: 8.7 mg/dL — ABNORMAL LOW (ref 8.9–10.3)
Chloride: 105 mmol/L (ref 98–111)
Creatinine, Ser: 1.86 mg/dL — ABNORMAL HIGH (ref 0.61–1.24)
GFR, Estimated: 42 mL/min — ABNORMAL LOW (ref >60)
Glucose, Bld: 125 mg/dL — ABNORMAL HIGH (ref 70–99)
Potassium: 3.7 mmol/L (ref 3.5–5.1)
Sodium: 136 mmol/L (ref 135–145)

## 2020-05-07 MED ORDER — HYDRALAZINE HCL 10 MG PO TABS
10.0000 mg | ORAL_TABLET | Freq: Three times a day (TID) | ORAL | Status: DC
  Administered 2020-05-07 – 2020-05-08 (×3): 10 mg via ORAL
  Filled 2020-05-07 (×3): qty 1

## 2020-05-07 NOTE — Progress Notes (Signed)
Patient ID: Bobby Branch, male   DOB: 16-May-1960, 60 y.o.   MRN: [id number] Follow up with the patient regarding questions/concerns. Canned smoked trout at bedside; took liberty to review low salt and low fat diet and how much sodium/container would be eaten with the snack; equivalent to 1/4 of total salt for the day. Girlfriend at bedside and discussed rationale for management of DM, HLD, HTN with both. Patient would like for girlfriend to attend therapy since she is her today; discussed with therapy opportunity to review transfers, toileting, etc with the support person. Continue to follow along to discharge to review secondary stroke risks with patient and support person.

## 2020-05-07 NOTE — Progress Notes (Signed)
Physical Therapy Session Note  Patient Details  Name: Bobby Branch MRN: [id number] Date of Birth: 1960-11-26  Today's Date: 05/07/2020 PT Individual Time: 1345-1445 PT Individual Time Calculation (min): 60 min   Short Term Goals: Week 2:  PT Short Term Goal 1 (Week 2): Pt will perform bed mobility min assist. PT Short Term Goal 2 (Week 2): Pt will perform bed to chair transfer mod assist. PT Short Term Goal 3 (Week 2): Pt will perform sit to stand min assist. PT Short Term Goal 4 (Week 2): Pt will ambulate >10 ft with LRAD with max assist +2.  Skilled Therapeutic Interventions/Progress Updates:    Patient received supine in bed, agreeable to PT. He denies pain. GF at bedside. He was able to come sit edge of bed with MinA and verbal cues for sequencing. Squat pivot transfer to wc with ModA. PT transporting patient in wc to therapy gym for time management and energy conservation. He ambulated 54f x3 with R hallway handrail, ModA, wc follow for safety and ModA/MaxA to advance L LE. PT applying shoe cover to L LE to assist in ease of advancing limb, however there was minimal difference. Patient able to initiate swing from hip, but demonstrated no active knee flexion/dorsiflexion through swing and would compensate for decreased hip flexor strength by using adductors- resulting in ER of the foot. Due to patients flexor withdrawal reflex, PT limited in how much assist could be provided distally so as to not elicit this reflex. Patient also with heavy reliance on R UE on the rail throughout gait training. NuStep x5 mins completed with B LE, emphasis on activation of L LE. Patient able to extend L knee and move pedal of NuStep with consistent external input to L LE to maintain attention to limb. Patient propelling himself in wc using hemi technique x557fwith MinA. He requested to transfer back to bed- ModA squat pivot and ModA to return supine. Bed alarm on, call light within reach.   Therapy  Documentation Precautions:  Precautions Precautions: Fall,Other (comment) Precaution Comments: L hemiplegia, L inattention Restrictions Weight Bearing Restrictions: No    Therapy/Group: Individual Therapy  JeKaroline CaldwellPT, DPT, CBIS  05/07/2020, 7:51 AM

## 2020-05-07 NOTE — Progress Notes (Signed)
Occupational Therapy Session Note  Patient Details  Name: Bobby Branch MRN: [id number] Date of Birth: 14-Sep-1960  Today's Date: 05/07/2020 OT Individual Time: [id number] OT Individual Time Calculation (min): 48 min    Short Term Goals: Week 2:  OT Short Term Goal 1 (Week 2): Pt will donn a pullover shirt for two consecutive sessions with min assist. OT Short Term Goal 2 (Week 2): Pt will complete toilet transfer and toileting tasks with no more than mod assist. OT Short Term Goal 3 (Week 2): Pt will integrate the LUE into bathing tasks as a stabilizer with mod assist and mod instructional cueing. OT Short Term Goal 4 (Week 2): Pt will maintain sustained attention to selfcare tasks for 15 mins with no more than min instructional cueing for re-direction.  Skilled Therapeutic Interventions/Progress Updates:    Pt in bed to start session with significant other present.  He was agreeable to shower, so nursing was called to unhook his IV.  He transferred to the EOB with mod assist from supine.  Charlaine Dalton was utilized for transfer to the shower secondary to time.  He completed sit to stand in the [place] and throughout session with mod assist.  Once in the shower, he needed mod instructional cueing for selective attention to task as he would get distracted with conversation.  Max hand over hand assist was also needed to integrate the LUE into washing the right arm or for holding the soap when opened.  Increased flexor tone noted in the digits.  Min assist for UB bathing with mod assist for LB sit to stand.  He transferred stand pivot to the wheelchair with max assist stand pivot once bathing was completed.  Mod assist for donning all UB clothing with mod assist for brief and pants following hemi technique.  He needed total assist for TEDs and gripper socks secondary to time.  Finished session with LUE supported on half lap tray in the wheelchair and with resting hand splint in place as well.  Safety belt positioned  with call button and phone in reach.    Therapy Documentation Precautions:  Precautions Precautions: Fall,Other (comment) Precaution Comments: L hemiplegia, L inattention Restrictions Weight Bearing Restrictions: No  Pain: Pain Assessment Pain Scale: Faces Faces Pain Scale: Hurts a little bit Pain Type: Chronic pain Pain Location: Shoulder Pain Orientation: Left Pain Descriptors / Indicators: Discomfort Pain Onset: With Activity Pain Intervention(s): Repositioned ADL: See Care Tool Section for some details of mobility and selfcare  Therapy/Group: Individual Therapy  Telitha Plath OTR/L 05/07/2020, 12:46 PM

## 2020-05-07 NOTE — Progress Notes (Signed)
Speech Language Pathology Daily Session Note  Patient Details  Name: Bobby Branch MRN: [id number] Date of Birth: May 14, 1960  Today's Date: 05/07/2020 SLP Individual Time: 0930-1030 SLP Individual Time Calculation (min): 60 min  Short Term Goals: Week 2: SLP Short Term Goal 1 (Week 2): Pt will demonstate ability to use compensatory memory strategies for semi-complex tasks/recall with Supervision A verbal/visual cues. SLP Short Term Goal 2 (Week 2): Pt will selectively attend to tasks with Min A cues for redirection. SLP Short Term Goal 3 (Week 2): Pt will demonstrate ability to problem solve mildly complex to complex situations with Min A cues. SLP Short Term Goal 4 (Week 2): Pt will detect fuctional errors with Min A cues. SLP Short Term Goal 5 (Week 2): Pt will anticipate 2 ADLs he will need assistance with at d/c and/or 2 ways to prevent falls at home with Min A cues  Skilled Therapeutic Interventions: Skilled SLP intervention focused on cognition. Girlfriend was present in room during session.Pt was able to remember this SLPs name and specific task that was completed at end of last session with this SLP. He completed written problem solving task using bus schedule and times.  He required moderate verbal direction to answer time calculation questions and needed increased time to process information. Moderate cues required throughout session for redirection to task due to patient getting distracted by his own thoughts. Girlfriend reports she feels patients cognition is near baseline. Cont with therapy per plan of care.      Pain Pain Assessment Pain Scale: Faces Faces Pain Scale: No hurt Pain Type: Chronic pain Pain Location: Shoulder Pain Orientation: Left Pain Descriptors / Indicators: Discomfort Pain Onset: With Activity Pain Intervention(s): Repositioned  Therapy/Group: Individual Therapy  Darrol Poke Dorell Gatlin 05/07/2020, 12:53 PM

## 2020-05-07 NOTE — Progress Notes (Signed)
PROGRESS NOTE   Subjective/Complaints: No issues overnight,    ROS:   Pt denies SOB, abd pain, CP, N/V/C/D, and vision changes   Objective:   No results found. Recent Labs    05/07/20 0656  WBC 7.9  HGB 12.4*  HCT 36.4*  PLT 366   Recent Labs    05/07/20 0656  NA 136  K 3.7  CL 105  CO2 25  GLUCOSE 125*  BUN 19  CREATININE 1.86*  CALCIUM 8.7*    Intake/Output Summary (Last 24 hours) at 05/07/2020 0756 Last data filed at 05/07/2020 [id number] Gross per 24 hour  Intake 992.23 ml  Output 1575 ml  Net -582.77 ml        Physical Exam: Vital Signs Blood pressure (!) 154/87, pulse 63, temperature 98.2 F (36.8 C), temperature source Oral, resp. rate 16, height '5\' 6"'$  (1.676 m), weight 49.2 kg, SpO2 100 %.  General: No acute distress Mood and affect are appropriate Heart: Regular rate and rhythm no rubs murmurs or extra sounds Lungs: Clear to auscultation, breathing unlabored, no rales or wheezes Abdomen: Positive bowel sounds, soft nontender to palpation, nondistended Extremities: No clubbing, cyanosis, or edema Skin: No evidence of breakdown, no evidence of rash  Motor:  RUE/RLE: 5/5 proximal distal  LUE/LLE: 0/5 proximal distal Increased tone noted in left upper extremity   Assessment/Plan: 1. Functional deficits which require 3+ hours per day of interdisciplinary therapy in a comprehensive inpatient rehab setting.  Physiatrist is providing close team supervision and 24 hour management of active medical problems listed below.  Physiatrist and rehab team continue to assess barriers to discharge/monitor patient progress toward functional and medical goals  Care Tool:  Bathing    Body parts bathed by patient: Left arm,Chest,Abdomen,Front perineal area,Right lower leg,Right upper leg,Left upper leg,Face   Body parts bathed by helper: Left lower leg,Buttocks,Right arm     Bathing assist Assist Level:  Moderate Assistance - Patient 50 - 74%     Upper Body Dressing/Undressing Upper body dressing   What is the patient wearing?: Pull over shirt    Upper body assist Assist Level: Minimal Assistance - Patient > 75%    Lower Body Dressing/Undressing Lower body dressing      What is the patient wearing?: Pants     Lower body assist Assist for lower body dressing: Maximal Assistance - Patient 25 - 49%     Toileting Toileting    Toileting assist Assist for toileting: Moderate Assistance - Patient 50 - 74%     Transfers Chair/bed transfer  Transfers assist     Chair/bed transfer assist level: Moderate Assistance - Patient 50 - 74% (squat pivot)     Locomotion Ambulation   Ambulation assist   Ambulation activity did not occur: Safety/medical concerns  Assist level: Total Assistance - Patient < 25% Assistive device: Lite Gait Max distance: 764f (Lite Gait)   Walk 10 feet activity   Assist  Walk 10 feet activity did not occur: Safety/medical concerns        Walk 50 feet activity   Assist Walk 50 feet with 2 turns activity did not occur: Safety/medical concerns  Walk 150 feet activity   Assist Walk 150 feet activity did not occur: Safety/medical concerns         Walk 10 feet on uneven surface  activity   Assist Walk 10 feet on uneven surfaces activity did not occur: Safety/medical concerns         Wheelchair     Assist Will patient use wheelchair at discharge?: Yes (Per PT long term goals) Type of Wheelchair: Manual           Wheelchair 50 feet with 2 turns activity    Assist    Wheelchair 50 feet with 2 turns activity did not occur: Safety/medical concerns       Wheelchair 150 feet activity     Assist  Wheelchair 150 feet activity did not occur: Safety/medical concerns       Blood pressure (!) 154/87, pulse 63, temperature 98.2 F (36.8 C), temperature source Oral, resp. rate 16, height '5\' 6"'$  (1.676  m), weight 49.2 kg, SpO2 100 %. Medical Problem List and Plan: 1.  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  Impaired mobility: -DVT/anticoagulation: Conitnue SCDs             -antiplatelet therapy: Continue Aspirin 325 mg daily and Plavix 75 mg daily x3 months 3. Left shoulder pain: Tylenol as needed. Add voltaren gel and lidocaine patch.   4/2-patient reports pain is controlled-continue regimen 4. Depression: Continue Lexapro 10 mg daily             -antipsychotic agents: N/A 5. Neuropsych: This patient is capable of making decisions on his own behalf. 6. Skin/Wound Care: Routine skin checks 7. Fluids/Electrolytes/Nutrition: Routine in and outs 8.  Hypertension.  BP has been labile- continue Norvasc 10 mg daily.  Monitor with increased mobility  4/3-blood pressure is running just a little high, hydration has improved may need  Vitals:   05/06/20 1934 05/07/20 0316  BP: (!) 149/81 (!) 154/87  Pulse: 77 63  Resp: 18 16  Temp: 97.9 F (36.6 C) 98.2 F (36.8 C)  SpO2: 99% 100%   9.  Diabetes mellitus with hyperglycemia.  Hemoglobin A1c 7.3.  SSI.  Patient on Glucophage 500 mg twice daily prior to admission.  Resume as needed CBG (last 3)  Recent Labs    05/06/20 2005 05/06/20 2356 05/07/20 0426  GLUCAP 177* 165* 166*   Am CBG ok elevated last evening  Pt states he has "problems with blood vessels in eyes and was told he needs to keep CBGs normal    4/3- start Tradjenta 5 mg daily and monitor 10.  Hyperlipidemia: Lipitor 11.  Polysubstance abuse.  Urine drug screen positive cocaine as well as marijuana.  ask neuropsych to provide  Counseling, pt plans to attend NA as OP   Tobacco abuse added nicoderm patch 16mg 12.  CKD stage III.    Creatinine  1.95 on 3/22,up to 2.44 on 3/28, 2.43 on 3/31, improved to 1.86 on 4/4 cont IVF qhs    4/3-ordered labs for Monday 13. 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 Scheduled at 11pm as per patient's request. Will not increase during day due to grogginess this morning.  15.  Slow transit constipation    16.  CHronic cough, smoker ,  check CXR- prn inhaler   LOS: 14 days A FACE TO [place] E Martha Ellerby 05/07/2020, 7:56 AM

## 2020-05-07 NOTE — Progress Notes (Signed)
Physical Therapy Session Note  Patient Details  Name: Bobby Branch MRN: [id number] Date of Birth: 13-Oct-1960  Today's Date: 05/07/2020 PT Individual Time: 1140-1210 PT Individual Time Calculation (min): 30 min   Short Term Goals: Week 2:  PT Short Term Goal 1 (Week 2): Pt will perform bed mobility min assist. PT Short Term Goal 2 (Week 2): Pt will perform bed to chair transfer mod assist. PT Short Term Goal 3 (Week 2): Pt will perform sit to stand min assist. PT Short Term Goal 4 (Week 2): Pt will ambulate >10 ft with LRAD with max assist +2.  Skilled Therapeutic Interventions/Progress Updates:     Pt received seated in [facility] and agrees to therapy. Reports some pain in L shoulder, number not provided. PT provides rest breaks and repositioning to manage pain. Pt transported via WC to gym for time management and energy conservation. Pt performs stand pivot transfer to the R to mat table with modA HHA. Session focused on NMR for standing balance and increasing weight bearing through L lower extremity. Pt performs x4 reps of sit to stand with minA/modA and PT blocking L knee. Pt stands without upper extremity support, performing lateral weight shifting progressing to forward and side steps with R lower extremity. Pt also practices allowing L knee to flex slightly and then bringing back to full extension with minA/modA from PT for body mechanics and tactile cueing at quad. Pt performs squat pivot transfer to the L to WC with modA. Left seated with alarm intact and all needs within reach.  Therapy Documentation Precautions:  Precautions Precautions: Fall,Other (comment) Precaution Comments: L hemiplegia, L inattention Restrictions Weight Bearing Restrictions: No    Therapy/Group: Individual Therapy  Breck Coons, PT, DPT 05/07/2020, 12:52 PM

## 2020-05-07 NOTE — Progress Notes (Signed)
Pt asked this nurse to put his money from his shorts in the drawer next to the bed. Pt handed this nurse a large amount of $100 bills. This nurse educated the pt on unit policy for pt being held responsible for all personal belongings. Pt refused to let this nurse count the money and lock it up with security. Money was placed in the top drawer of the bedside table. Emai, NT present and witnessed this nurse putting the money in the drawer.

## 2020-05-08 LAB — GLUCOSE, CAPILLARY
Glucose-Capillary: 102 mg/dL — ABNORMAL HIGH (ref 70–99)
Glucose-Capillary: 113 mg/dL — ABNORMAL HIGH (ref 70–99)
Glucose-Capillary: 135 mg/dL — ABNORMAL HIGH (ref 70–99)
Glucose-Capillary: 183 mg/dL — ABNORMAL HIGH (ref 70–99)
Glucose-Capillary: 190 mg/dL — ABNORMAL HIGH (ref 70–99)
Glucose-Capillary: 209 mg/dL — ABNORMAL HIGH (ref 70–99)

## 2020-05-08 MED ORDER — HYDRALAZINE HCL 25 MG PO TABS
25.0000 mg | ORAL_TABLET | Freq: Three times a day (TID) | ORAL | Status: DC
  Administered 2020-05-08 – 2020-05-15 (×22): 25 mg via ORAL
  Filled 2020-05-08 (×22): qty 1

## 2020-05-08 NOTE — Progress Notes (Signed)
PROGRESS NOTE   Subjective/Complaints:  No issues overnight , wel discussed need for increasing po fluids   ROS:   Pt denies SOB, abd pain, CP, N/V/C/D, and vision changes   Objective:   No results found. Recent Labs    05/07/20 0656  WBC 7.9  HGB 12.4*  HCT 36.4*  PLT 366   Recent Labs    05/07/20 0656  NA 136  K 3.7  CL 105  CO2 25  GLUCOSE 125*  BUN 19  CREATININE 1.86*  CALCIUM 8.7*    Intake/Output Summary (Last 24 hours) at 05/08/2020 0745 Last data filed at 05/08/2020 0421 Gross per 24 hour  Intake 440 ml  Output 1800 ml  Net -1360 ml        Physical Exam: Vital Signs Blood pressure (!) 148/75, pulse 63, temperature 97.6 F (36.4 C), resp. rate 17, height '5\' 6"'$  (1.676 m), weight 49.2 kg, SpO2 100 %.  General: No acute distress Mood and affect are appropriate Heart: Regular rate and rhythm no rubs murmurs or extra sounds Lungs: Clear to auscultation, breathing unlabored, no rales or wheezes Abdomen: Positive bowel sounds, soft nontender to palpation, nondistended Extremities: No clubbing, cyanosis, or edema Skin: No evidence of breakdown, no evidence of rash  Motor:  RUE/RLE: 5/5 proximal distal  LUE/LLE: 0/5 proximal distal Increased tone noted in left upper extremity , flexor withdrawal LLE   Assessment/Plan: 1. Functional deficits which require 3+ hours per day of interdisciplinary therapy in a comprehensive inpatient rehab setting.  Physiatrist is providing close team supervision and 24 hour management of active medical problems listed below.  Physiatrist and rehab team continue to assess barriers to discharge/monitor patient progress toward functional and medical goals  Care Tool:  Bathing    Body parts bathed by patient: Left arm,Chest,Abdomen,Front perineal area,Right lower leg,Right upper leg,Left upper leg,Face   Body parts bathed by helper: Left lower leg,Buttocks,Right  arm     Bathing assist Assist Level: Moderate Assistance - Patient 50 - 74%     Upper Body Dressing/Undressing Upper body dressing   What is the patient wearing?: Pull over shirt    Upper body assist Assist Level: Moderate Assistance - Patient 50 - 74%    Lower Body Dressing/Undressing Lower body dressing      What is the patient wearing?: Pants,Incontinence brief     Lower body assist Assist for lower body dressing: Moderate Assistance - Patient 50 - 74%     Toileting Toileting    Toileting assist Assist for toileting: Moderate Assistance - Patient 50 - 74%     Transfers Chair/bed transfer  Transfers assist     Chair/bed transfer assist level: Moderate Assistance - Patient 50 - 74%     Locomotion Ambulation   Ambulation assist   Ambulation activity did not occur: Safety/medical concerns  Assist level: 2 helpers Assistive device: Other (comment) Max distance: 50   Walk 10 feet activity   Assist  Walk 10 feet activity did not occur: Safety/medical concerns  Assist level: 2 helpers Assistive device: Other (comment)   Walk 50 feet activity   Assist Walk 50 feet with 2 turns activity did not occur: Safety/medical  concerns  Assist level: 2 helpers Assistive device: Other (comment)    Walk 150 feet activity   Assist Walk 150 feet activity did not occur: Safety/medical concerns         Walk 10 feet on uneven surface  activity   Assist Walk 10 feet on uneven surfaces activity did not occur: Safety/medical concerns         Wheelchair     Assist Will patient use wheelchair at discharge?: Yes Type of Wheelchair: Manual    Wheelchair assist level: Minimal Assistance - Patient > 75% Max wheelchair distance: 50    Wheelchair 50 feet with 2 turns activity    Assist    Wheelchair 50 feet with 2 turns activity did not occur: Safety/medical concerns   Assist Level: Minimal Assistance - Patient > 75%   Wheelchair 150 feet  activity     Assist  Wheelchair 150 feet activity did not occur: Safety/medical concerns       Blood pressure (!) 148/75, pulse 63, temperature 97.6 F (36.4 C), resp. rate 17, height '5\' 6"'$  (1.676 m), weight 49.2 kg, SpO2 100 %. Medical Problem List and Plan: 1.  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  Impaired mobility: -DVT/anticoagulation: Conitnue SCDs             -antiplatelet therapy: Continue Aspirin 325 mg daily and Plavix 75 mg daily x3 months 3. Left shoulder pain: Tylenol as needed. Add voltaren gel and lidocaine patch.   4/2-patient reports pain is controlled-continue regimen 4. Depression: Continue Lexapro 10 mg daily             -antipsychotic agents: N/A 5. Neuropsych: This patient is capable of making decisions on his own behalf. 6. Skin/Wound Care: Routine skin checks 7. Fluids/Electrolytes/Nutrition: Routine in and outs 8.  Hypertension.  BP has been labile- continue Norvasc 10 mg daily.  Monitor with increased mobility   Vitals:   05/07/20 1953 05/08/20 0420  BP: (!) 169/94 (!) 148/75  Pulse: 75 63  Resp: 17 17  Temp: 98.5 F (36.9 C) 97.6 F (36.4 C)  SpO2: 99% 100%  increase apresoline  9.  Diabetes mellitus with hyperglycemia.  Hemoglobin A1c 7.3.  SSI.  Patient on Glucophage 500 mg twice daily prior to admission.  Resume as needed CBG (last 3)  Recent Labs    05/07/20 1953 05/08/20 0011 05/08/20 0422  GLUCAP 156* 183* 135*   Am CBG ok elevated last evening  Pt states he has "problems with blood vessels in eyes and was told he needs to keep CBGs normal    4/3- start Tradjenta 5 mg daily and monitor 10.  Hyperlipidemia: Lipitor 11.  Polysubstance abuse.  Urine drug screen positive cocaine as well as marijuana.   ask neuropsych to provide  Counseling, pt plans to attend [facility] as OP   Tobacco abuse added nicoderm patch 12mg 12.  CKD stage III.    Creatinine 1.95 on 3/22,up to 2.44 on 3/28, 2.43 on 3/31, improved to 1.86 on 4/4 cont IVF qhs    4/3-ordered labs for Monday 13. 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 Scheduled at 11pm as per patient's request. Will not increase during day due to grogginess this morning.  15.  Slow transit  constipation    16.  CHronic cough, smoker , check CXR- prn inhaler   LOS: 15 days A FACE TO FACE EVALUATION WAS PERFORMED  Charlett Blake 05/08/2020, 7:45 AM

## 2020-05-08 NOTE — Progress Notes (Signed)
Nutrition Follow-up  DOCUMENTATION CODES:   Underweight,Severe malnutrition in context of social or environmental circumstances  INTERVENTION:   -Continue Glucerna Shake po TID, each supplement provides 220 kcal and 10 grams of protein  - Continue MVI with minerals daily  - Encourage adequate PO intake  NUTRITION DIAGNOSIS:   Severe Malnutrition related to social / environmental circumstances (hx of polysubstance abuse) as evidenced by severe fat depletion,severe muscle depletion.  Ongoing, being addressed via supplements  GOAL:   Patient will meet greater than or equal to 90% of their needs  Progressing  MONITOR:   PO intake,Supplement acceptance,Labs,Weight trends  REASON FOR ASSESSMENT:   Other (low BMI)    ASSESSMENT:   60 year old male with PMH of prior CVA in August 2021, DM, CKD stage III, HTN, HLD, polysubstance abuse. Presented 04/20/20 with acute onset of left-sided weakness. Pt found to have right MCA stroke. Admitted to CIR on 3/21.  Spoke with pt and significant other at bedside. Pt reports good appetite and states that he is eating well. He is frustrated about not being able to eat breakfast yesterday due to having to be "supervised." He states that this issues has been resolved. He reports that he has been consuming at least 1-2 Glucerna supplements a day. Will continue with current regimen.  No new weights since 3/21.  Meal Completion: 40-100% x last 8 meals (averaging 84%)  Medications reviewed and include: colace, pepcid, Glucerna TID, SSI, tradjenta, MVI with minerals, miralax, senna IVF: 1/2NS @ 75 ml/hr  Labs reviewed: creatinine 1.86 CBG's: 115-190 x 24 hours  UOP: 2000 ml x 24 hours I/O's: -7.2 L since admission  Diet Order:   Diet Order            Diet Carb Modified Fluid consistency: Thin; Room service appropriate? Yes  Diet effective now                 EDUCATION NEEDS:   No education needs have been identified at this  time  Skin:  Skin Assessment: Skin Integrity Issues: Incisions: right thigh  Last BM:  05/07/20  Height:   Ht Readings from Last 1 Encounters:  04/23/20 '5\' 6"'$  (1.676 m)    Weight:   Wt Readings from Last 1 Encounters:  04/23/20 49.2 kg    BMI:  Body mass index is 17.51 kg/m.  Estimated Nutritional Needs:   Kcal:  1800-2000  Protein:  80-95 grams  Fluid:  1.8 L/day    Gustavus Bryant, MS, RD, LDN Inpatient Clinical Dietitian Please see [facility] for contact information.

## 2020-05-08 NOTE — Progress Notes (Signed)
Occupational Therapy Session Note  Patient Details  Name: Bobby Branch MRN: [id number] Date of Birth: Mar 06, 1960  Today's Date: 05/08/2020 OT Individual Time: [id number] Individual Time Calculation (min): 60 min    Short Term Goals: Week 2:  OT Short Term Goal 1 (Week 2): Pt will donn a pullover shirt for two consecutive sessions with min assist. OT Short Term Goal 2 (Week 2): Pt will complete toilet transfer and toileting tasks with no more than mod assist. OT Short Term Goal 3 (Week 2): Pt will integrate the LUE into bathing tasks as a stabilizer with mod assist and mod instructional cueing. OT Short Term Goal 4 (Week 2): Pt will maintain sustained attention to selfcare tasks for 15 mins with no more than min instructional cueing for re-direction.  Skilled Therapeutic Interventions/Progress Updates:    Pt was in the bed at start of session with completion of supine to sit with min assist. He then worked on donning his pants with mod assist, including transfer to the wheelchair from the bed.  Next, he was taken down to the therapy gym where he transferred to the mat at the same level.  Therapist applied NMES to the left elbow extensors and digit extensors to assist with active movement and help decrease current flexor tone.  Intensity on digit extensors was at 30 with it being 31 at the elbow extensors.  PPS at 35 with pulse with at 300.  On/off time was 10 seconds with simultaneous stimulation.  He was able to tolerate for 17 mins with instruction to assist stimulation with functional reaching to target.  Mod assist was needed for facilitation at the shoulder to reach down to knee level.  No pain or discomfort noted after completion of stimulation.  Finished session with transfer back to the room and pt left sitting up with the call button and phone in reach and safety belt in place.    Therapy Documentation Precautions:  Precautions Precautions: Fall,Other (comment) Precaution Comments: L  hemiplegia, L inattention Restrictions Weight Bearing Restrictions: No  Pain: Pain Assessment Pain Scale: Faces Faces Pain Scale: No hurt Pain Type: Acute pain Pain Location: Shoulder Pain Orientation: Left Pain Descriptors / Indicators: Discomfort Pain Onset: With Activity Pain Intervention(s): Repositioned ADL: See Care Tool Section for some details of mobility and selfcare  Therapy/Group: Individual Therapy  Danija Gosa OTR/L 05/08/2020, 12:14 PM

## 2020-05-08 NOTE — Progress Notes (Signed)
Physical Therapy Weekly Progress Note  Patient Details  Name: Bobby Branch MRN: [id number] Date of Birth: 1960-08-28  Beginning of progress report period: May 01, 2020 End of progress report period: May 08, 2020  Today's Date: 05/08/2020 PT Individual Time: [id number] PT Individual Time Calculation (min): 46 min   Patient has met 2 of 4 short term goals. Pt is continuing to make slow progress towards goals and is limited by L inattention, L hemiparesis, and poor attention to task. Pt is grossly mod assist. Pt has continued to progress with gait training, showing promise to advance to mod assist during ambulation, but continues to be limited by limited LLE activation and difficulty motor planning. Education on and training for wheelchair mobility has been initiated to prepare for discharge.  Patient continues to demonstrate the following deficits muscle weakness and muscle joint tightness, decreased cardiorespiratoy endurance, abnormal tone, decreased coordination and decreased motor planning, decreased attention to left and decreased motor planning, decreased initiation, decreased attention, decreased awareness, decreased problem solving and decreased safety awareness and decreased standing balance, decreased postural control and hemiplegia and therefore will continue to benefit from skilled PT intervention to increase functional independence with mobility.  Patient progressing toward long term goals..  Continue plan of care.  PT Short Term Goals Week 2:  PT Short Term Goal 1 (Week 2): Pt will perform bed mobility min assist. PT Short Term Goal 1 - Progress (Week 2): Progressing toward goal PT Short Term Goal 2 (Week 2): Pt will perform bed to chair transfer mod assist. PT Short Term Goal 2 - Progress (Week 2): Met PT Short Term Goal 3 (Week 2): Pt will perform sit to stand min assist. PT Short Term Goal 3 - Progress (Week 2): Progressing toward goal PT Short Term Goal 4 (Week 2): Pt will  ambulate >10 ft with LRAD with max assist +2. PT Short Term Goal 4 - Progress (Week 2): Met Week 3:  PT Short Term Goal 1 (Week 3): Pt will perform sit to stand min assist. PT Short Term Goal 2 (Week 3): Pt will perform bed mobility min assist. PT Short Term Goal 3 (Week 3): Pt will perform bed to chair transfer min assist. PT Short Term Goal 4 (Week 3): Pt will ambulate >40ft with LRAD with mod assist +2.   Skilled Therapeutic Interventions/Progress Updates:    Pt sitting in recliner finishing lunch with daughter in room upon PT arrival. Daughter left room after a few min. Pt did not report new pain and was agreeable to therapy. Pt transferred stand pivot mod assist HHA towards L into WC. PT L knee blocking required to avoid buckling. Pt cued to place L leg on leg rest; unable to do so but L quad contraction felt. Pt wheeled to hallway outside of therapy gym. Gait training performed using R HR with PT providing max assist on L side. PT L knee blocking required throughout. Total assistance required to advance LLE during L swing with tendency to land in ER upon heel strike, although minor initiation of swing observed. Tactile cuing and assistance required to promote hip extension during R swing. Verbal cuing to remain upright and "look up" for better posture. Performed 2x at 25 ft each with +2 WC follow (seated rest break in between), and then repeated 3rd time with +2 HHA on R side for 50 ft. Similar gait traits and assistance on 3rd walk. Pt wheeled back to room and transferred to sitting EOB towards L mod assist. Transferred  to supine min assist. Left in bed with needs within reach and bed alarm turned on.  Therapy Documentation Precautions:  Precautions Precautions: Fall,Other (comment) Precaution Comments: L hemiplegia, L inattention Restrictions Weight Bearing Restrictions: No   Therapy/Group: Individual Therapy  Eleonore Chiquito, SPT 05/08/2020, 6:37 PM

## 2020-05-08 NOTE — Progress Notes (Signed)
Speech Language Pathology Daily Session Note  Patient Details  Name: Nahsir Iuliano MRN: [id number] Date of Birth: 1960/02/08  Today's Date: 05/08/2020 SLP Individual Time: 1000-1057 SLP Individual Time Calculation (min): 57 min  Short Term Goals: Week 2: SLP Short Term Goal 1 (Week 2): Pt will demonstate ability to use compensatory memory strategies for semi-complex tasks/recall with Supervision A verbal/visual cues. SLP Short Term Goal 2 (Week 2): Pt will selectively attend to tasks with Min A cues for redirection. SLP Short Term Goal 3 (Week 2): Pt will demonstrate ability to problem solve mildly complex to complex situations with Min A cues. SLP Short Term Goal 4 (Week 2): Pt will detect fuctional errors with Min A cues. SLP Short Term Goal 5 (Week 2): Pt will anticipate 2 ADLs he will need assistance with at d/c and/or 2 ways to prevent falls at home with Min A cues  Skilled Therapeutic Interventions: Skilled SLP intervention focused on cognition. Selective attention card sorting task completed with min A for redirection to task due to patient getting distracted by his own thoughts. Pt identified errors on printed calendar with mod A visual and verbal cues.  Intermittent supervision sign removed due to patient tolerating diet at appropriate rate with no overt s/sx of aspiration or penetration. Pt is near baseline with cognition. Will likely discharge from SLP tx in next 2-3 days.     Pain Pain Assessment Pain Scale: Faces Pain Score: 0-No pain Faces Pain Scale: No hurt  Therapy/Group: Individual Therapy  Darrol Poke Bannon Giammarco 05/08/2020, 10:58 AM

## 2020-05-09 LAB — GLUCOSE, CAPILLARY
Glucose-Capillary: 111 mg/dL — ABNORMAL HIGH (ref 70–99)
Glucose-Capillary: 113 mg/dL — ABNORMAL HIGH (ref 70–99)
Glucose-Capillary: 142 mg/dL — ABNORMAL HIGH (ref 70–99)
Glucose-Capillary: 202 mg/dL — ABNORMAL HIGH (ref 70–99)

## 2020-05-09 MED ORDER — TIZANIDINE HCL 2 MG PO TABS
2.0000 mg | ORAL_TABLET | Freq: Three times a day (TID) | ORAL | Status: DC
  Administered 2020-05-09 – 2020-05-22 (×40): 2 mg via ORAL
  Filled 2020-05-09 (×41): qty 1

## 2020-05-09 MED ORDER — TRAZODONE HCL 50 MG PO TABS
50.0000 mg | ORAL_TABLET | Freq: Every evening | ORAL | Status: DC | PRN
  Administered 2020-05-09: 50 mg via ORAL
  Filled 2020-05-09: qty 1

## 2020-05-09 NOTE — Progress Notes (Signed)
Speech Language Pathology Daily Session Note  Patient Details  Name: Bobby Branch MRN: [id number] Date of Birth: 07-Dec-1960  Today's Date: 05/09/2020 SLP Individual Time: [id number] SLP Individual Time Calculation (min): 55 min  Short Term Goals: Week 2: SLP Short Term Goal 1 (Week 2): Pt will demonstate ability to use compensatory memory strategies for semi-complex tasks/recall with Supervision A verbal/visual cues. SLP Short Term Goal 2 (Week 2): Pt will selectively attend to tasks with Min A cues for redirection. SLP Short Term Goal 3 (Week 2): Pt will demonstrate ability to problem solve mildly complex to complex situations with Min A cues. SLP Short Term Goal 4 (Week 2): Pt will detect fuctional errors with Min A cues. SLP Short Term Goal 5 (Week 2): Pt will anticipate 2 ADLs he will need assistance with at d/c and/or 2 ways to prevent falls at home with Min A cues  Skilled Therapeutic Interventions: Skilled SLP intervention focused on cognition. Pt completed semi complex time calculations with moderate assistance for redirection and attention to task.  Pt required information  repeated multiple times to increased processing. Moderate verbal cues required for anticipatory awareness when discussing DC plan for home. Pt stated "im going to take care of myself". Max A to state physical limitations and reasoning with assistance he will need once home due to physical limitations. Pt left seated upright in wheel chear with chair alarm set and call bell within reach. Cont with therapy per plan of care.   Pain Pain Assessment Pain Scale: Faces Faces Pain Scale: No hurt Pain Type: Neuropathic pain Pain Location: Shoulder Pain Orientation: Left Pain Descriptors / Indicators: Discomfort Pain Onset: With Activity Pain Intervention(s): Repositioned  Therapy/Group: Individual Therapy  Darrol Poke Exilda Wilhite 05/09/2020, 1:54 PM

## 2020-05-09 NOTE — Patient Care Conference (Signed)
Inpatient RehabilitationTeam Conference and Plan of Care Update Date: 05/09/2020   Time: 10:54 AM    Patient Name: Bobby Branch      Medical Record Number: [id number]  Date of Birth: 15-Feb-1960 Sex: Male         Room/Bed: 4M05C/4M05C-01 Payor Info: Payor: MEDICAID PENDING / Plan: MEDICAID PENDING / Product Type: *No Product type* /    Admit Date/Time:  04/23/2020  6:20 PM  Primary Diagnosis:  Right middle cerebral artery stroke [facility])  Hospital Problems: Principal Problem:   Right middle cerebral artery stroke ([place]) Active Problems:   Protein-calorie malnutrition, severe   Slow transit constipation   Spastic hemiplegia affecting nondominant side ([place])   Stage 3b chronic kidney disease ([place])   Essential hypertension   Controlled type 2 diabetes mellitus with hyperglycemia, without long-term current use of insulin [facility])    Expected Discharge Date: Expected Discharge Date: 05/22/20  Team Members Present: Physician leading conference: Dr. Alysia Penna Care Coodinator Present: Dorien Chihuahua, RN, BSN, CRRN;Christina [place], [place] Nurse Present: Dorien Chihuahua, RN PT Present: Page Spiro, PT OT Present: Clyda Greener, OT SLP Present: Junius Argyle, SLP PPS Coordinator present : Gunnar Fusi, SLP     Current Status/Progress Goal Weekly Team Focus  Bowel/Bladder   Pt is continent x2, LBM 05/08/20  Pt. will maintain normal B/B patetern  Q2h toileting/PRN   Swallow/Nutrition/ Hydration             ADL's   Min assist for UB bathing with min to mod for UB dressing.  Mod assist for LB bathing and dressing.  Mod assist for sqaut pivot transfers with max for stand pivots.  Still with increased flexor tone noted in the biceps and finger flexors with increased left shoulder pain as well.  supervision to min assist overall  selfcare retraining, DME education, neuromuscular re-education, NMES, balance retraining, pt education   Mobility   Supine to L sidelying min assist. Supine to R  sidelying mod assist. Bed/Chair squat pivot transfer mod assist. Sit to stand mod assist. Gait training continued max assist +2 (limited LLE active motion). Wheelchair mobility initiated. Progress continues to be limited due to L inattention and poor attention to task.  Min assist overall. Will continue to monitor if needing to be downgraded.  patient education, transfers, bed mobility, gait progressions, L hemi NMR, balance, activity tolerance, wheelchair mobility   Communication             Safety/Cognition/ Behavioral Observations  Min A-Supervision for semi complex problem solving, selective attention, and awareness of errors.  Supervision  awareness of errors, semi complex problem solving, selective attention.   Pain   pt c/o l shoulder pain. scheduled and prns effective  Assess pt. for pain on every shift and prn and administered pain medication as ordered.  Assess pain qshift/prn   Skin   pt skin intact  Pt. skin intergrity will remain intact with no further breakdown.  assess skin qshift/prn     Discharge Planning:  pt d/c home with girlfriend and family to provide care   Team Discussion: MD trial meds for sleep as melatonin is not effective per pt report. Poor intake; enc fluids w HS IVF. BP remains elevated with medications. Progress limited by tone in elbow and shoulder pain. Also note issues with sustained attention left inattention, requires redirection to stay on task and perseverate on pre-admission issues.  Patient on target to meet rehab goals: yes, currently min assist for upper body care and mod assist  for lower body care. Mod assist = total for squat pivot transfers. +2 max assist for ambulation. Min assist goals set for discharge. Supervision cognition goals and min assist - supervision at present.  *See Care Plan and progress notes for long and short-term goals.   Revisions to Treatment Plan:  Neuromuscular e-stim Resting hand splint  Teaching Needs: Secondary  stroke risk factor management, transfers, toileting, medications, etc.   Current Barriers to Discharge: Decreased caregiver support, Home enviroment access/layout and Insurance for SNF coverage  Possible Resolutions to Barriers: Education with friend Wheelchair level mobility for home    Medical Summary Current Status: Left shulder pain , poor sleep, reduce fluid intake , on IVF, BP uncontrolled  Barriers to Discharge: Medical stability   Possible Resolutions to [employer] Focus: Wean from IVF, adjust BP meds   Continued Need for Acute Rehabilitation Level of Care: The patient requires daily medical management by a physician with specialized training in physical medicine and rehabilitation for the following reasons: Direction of a multidisciplinary physical rehabilitation program to maximize functional independence : Yes Medical management of patient stability for increased activity during participation in an intensive rehabilitation regime.: Yes Analysis of laboratory values and/or radiology reports with any subsequent need for medication adjustment and/or medical intervention. : Yes   I attest that I was present, lead the team conference, and concur with the assessment and plan of the team.   Dorien Chihuahua B 05/09/2020, 3:51 PM

## 2020-05-09 NOTE — Progress Notes (Signed)
PROGRESS NOTE   Subjective/Complaints:  C/o poor sleep, q4 h CBG, (actually only ac hs)   ROS:   Pt denies SOB, abd pain, CP, N/V/C/D, and vision changes   Objective:   No results found. Recent Labs    05/07/20 0656  WBC 7.9  HGB 12.4*  HCT 36.4*  PLT 366   Recent Labs    05/07/20 0656  NA 136  K 3.7  CL 105  CO2 25  GLUCOSE 125*  BUN 19  CREATININE 1.86*  CALCIUM 8.7*    Intake/Output Summary (Last 24 hours) at 05/09/2020 0755 Last data filed at 05/09/2020 0600 Gross per 24 hour  Intake 3268.99 ml  Output 1175 ml  Net 2093.99 ml        Physical Exam: Vital Signs Blood pressure (!) 152/88, pulse 67, temperature 97.7 F (36.5 C), resp. rate 16, height _0  (1.676 m), weight 49.2 kg, SpO2 99 %.  General: No acute distress Mood and affect are appropriate Heart: Regular rate and rhythm no rubs murmurs or extra sounds Lungs: Clear to auscultation, breathing unlabored, no rales or wheezes Abdomen: Positive bowel sounds, soft nontender to palpation, nondistended Extremities: No clubbing, cyanosis, or edema Skin: No evidence of breakdown, no evidence of rash Motor:  RUE/RLE: 5/5 proximal distal  LUE/LLE: 0/5 proximal distal Increased tone noted in left upper extremity , flexor withdrawal LLE   Assessment/Plan: 1. Functional deficits which require 3+ hours per day of interdisciplinary therapy in a comprehensive inpatient rehab setting.  Physiatrist is providing close team supervision and 24 hour management of active medical problems listed below.  Physiatrist and rehab team continue to assess barriers to discharge/monitor patient progress toward functional and medical goals  Care Tool:  Bathing    Body parts bathed by patient: Left arm,Chest,Abdomen,Front perineal area,Right lower leg,Right upper leg,Left upper leg,Face   Body parts bathed by helper: Left lower leg,Buttocks,Right arm     Bathing  assist Assist Level: Moderate Assistance - Patient 50 - 74%     Upper Body Dressing/Undressing Upper body dressing   What is the patient wearing?: Pull over shirt    Upper body assist Assist Level: Moderate Assistance - Patient 50 - 74%    Lower Body Dressing/Undressing Lower body dressing      What is the patient wearing?: Pants,Incontinence brief     Lower body assist Assist for lower body dressing: Moderate Assistance - Patient 50 - 74%     Toileting Toileting    Toileting assist Assist for toileting: Moderate Assistance - Patient 50 - 74%     Transfers Chair/bed transfer  Transfers assist     Chair/bed transfer assist level: Moderate Assistance - Patient 50 - 74%     Locomotion Ambulation   Ambulation assist   Ambulation activity did not occur: Safety/medical concerns  Assist level: 2 helpers Assistive device: Other (comment) Max distance: 42f   Walk 10 feet activity   Assist  Walk 10 feet activity did not occur: Safety/medical concerns  Assist level: 2 helpers Assistive device: Other (comment)   Walk 50 feet activity   Assist Walk 50 feet with 2 turns activity did not occur: Safety/medical concerns  Assist level: 2 helpers Assistive device: Other (comment)    Walk 150 feet activity   Assist Walk 150 feet activity did not occur: Safety/medical concerns         Walk 10 feet on uneven surface  activity   Assist Walk 10 feet on uneven surfaces activity did not occur: Safety/medical concerns         Wheelchair     Assist Will patient use wheelchair at discharge?: Yes Type of Wheelchair: Manual    Wheelchair assist level: Minimal Assistance - Patient > 75% Max wheelchair distance: 50    Wheelchair 50 feet with 2 turns activity    Assist    Wheelchair 50 feet with 2 turns activity did not occur: Safety/medical concerns   Assist Level: Minimal Assistance - Patient > 75%   Wheelchair 150 feet activity      Assist  Wheelchair 150 feet activity did not occur: Safety/medical concerns       Blood pressure (!) 152/88, pulse 67, temperature 97.7 F (36.5 C), resp. rate 16, height $RemoveBe'5\' 6"'MHPFNkaWX$  (1.676 m), weight 49.2 kg, SpO2 99 %. Medical Problem List and Plan: 1.  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 Formulized individual treatment plan based on medical history, underlying problem and comorbidities.   WHO/PRAFO at bedtime 2.  Impaired mobility: -DVT/anticoagulation: Conitnue SCDs             -antiplatelet therapy: Continue Aspirin 325 mg daily and Plavix 75 mg daily x3 months 3. Left shoulder pain: Tylenol as needed. Add voltaren gel and lidocaine patch.   4/2-patient reports pain is controlled-continue regimen 4. Depression: Continue Lexapro 10 mg daily             -antipsychotic agents: N/A 5. Neuropsych: This patient is capable of making decisions on his own behalf. 6. Skin/Wound Care: Routine skin checks 7. Fluids/Electrolytes/Nutrition: Routine in and outs repeat BMET in am  8.  Hypertension.  BP has been labile- continue Norvasc 10 mg daily.  Monitor with increased mobility   Vitals:   05/08/20 1946 05/09/20 0501  BP: (!) 168/91 (!) 152/88  Pulse: 92 67  Resp: 17 16  Temp: 97.6 F (36.4 C) 97.7 F (36.5 C)  SpO2: 99% 99%  increase apresoline  9.  Diabetes mellitus with hyperglycemia.  Hemoglobin A1c 7.3.  SSI.  Patient on Glucophage 500 mg twice daily prior to admission.  Resume as needed CBG (last 3)  Recent Labs    05/08/20 1638 05/08/20 2026 05/09/20 0625  GLUCAP 113* 209* 111*       4/3- start Tradjenta 5 mg daily and  monitor 10.  Hyperlipidemia: Lipitor 11.  Polysubstance abuse.  Urine drug screen positive cocaine as well as marijuana.  ask neuropsych to provide  Counseling, pt plans to attend NA as OP   Tobacco abuse added nicoderm patch 37mcg 12.  CKD stage III.    Creatinine 1.95 on 3/22,up to 2.44 on 3/28, 2.43 on 3/31, improved to 1.86 on 4/4 cont IVF qhs    4/3-ordered labs for Monday 13. 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 Scheduled at 11pm as per patient's request. Will not increase during  day due to grogginess this morning.  15.  Slow transit constipation    16.  Insomnia- will schedule trazodone    LOS: 16 days A FACE TO Amo E Talisa Petrak 05/09/2020, 7:55 AM

## 2020-05-09 NOTE — Progress Notes (Signed)
Occupational Therapy Session Note  Patient Details  Name: [name] MRN: [id number] Date of Birth: 05-06-1960  Today's Date: 05/09/2020 OT Individual Time: 0905-1005 OT Individual Time Calculation (min): 60 min    Short Term Goals: Week 2:  OT Short Term Goal 1 (Week 2): Pt will donn a pullover shirt for two consecutive sessions with min assist. OT Short Term Goal 2 (Week 2): Pt will complete toilet transfer and toileting tasks with no more than mod assist. OT Short Term Goal 3 (Week 2): Pt will integrate the LUE into bathing tasks as a stabilizer with mod assist and mod instructional cueing. OT Short Term Goal 4 (Week 2): Pt will maintain sustained attention to selfcare tasks for 15 mins with no more than min instructional cueing for re-direction.  Skilled Therapeutic Interventions/Progress Updates:    Pt completed supine to sit EOB with mod assist in preparation for session.  Worked on dressing with min assist to donn a new pullover shirt.  He was able to complete LB dressing with mod assist to donn a new brief and pants.  Mod assist for transfer to the right side stand pivot to the wheelchair.  Took pt down to the therapy gym with transfer to the therapy mat at the same level.  Therapist performed scapular mobilizations to the left shoulder for elevation, depression, and abduction.  Place hand paddle on the left hand for increased digit extension for weightbearing tasks.  Placed LUE beside of him on the mat with focus on transitions from left lean to sitting with emphasis on left elbow extension.  Therapist placed NMES to the left triceps to help facilitate left elbow extension with sidelying to sit.  Intensity on level 30 with on time 10 seconds and off time 5 seconds.  He tolerated 10 mins without any adverse reactions while working in weightbearing.  Finished session with return to the wheelchair at [facility] assist level and return to the room.  He was left with the phone and call button in reach  and safety belt in place.    Therapy Documentation Precautions:  Precautions Precautions: Fall,Other (comment) Precaution Comments: L hemiplegia, L inattention Restrictions Weight Bearing Restrictions: No  Pain: Pain Assessment Pain Scale: Faces Pain Score: 0-No pain Faces Pain Scale: Hurts a little bit Pain Type: Neuropathic pain Pain Location: Shoulder Pain Orientation: Left Pain Descriptors / Indicators: Discomfort Pain Onset: With Activity Pain Intervention(s): Repositioned ADL: See Care Tool Section for some details of mobility and selfcare  Therapy/Group: Individual Therapy  Kaitlyn Skowron OTR/L 05/09/2020, 10:37 AM

## 2020-05-09 NOTE — Progress Notes (Signed)
Pt requesting sleep medication. Provider will be made aware.

## 2020-05-09 NOTE — Progress Notes (Signed)
Physical Therapy Session Note  Patient Details  Name: Toure Heeter MRN: [id number] Date of Birth: 1960/09/01  Today's Date: 05/09/2020 PT Individual Time: 1536-1650 PT Individual Time Calculation (min): 74 min   Short Term Goals: Week 3:  PT Short Term Goal 1 (Week 3): Pt will perform sit to stand min assist. PT Short Term Goal 2 (Week 3): Pt will perform bed mobility min assist. PT Short Term Goal 3 (Week 3): Pt will perform bed to chair transfer min assist. PT Short Term Goal 4 (Week 3): Pt will ambulate >29f with LRAD with mod assist +2.  Skilled Therapeutic Interventions/Progress Updates:    Pt received sitting in w/c attempting to move w/c over to other side of the bed to get something - educated pt on having nursing assist with setting up room to allow him safe w/c mobility under their supervision. Pt agreeable to therapy session.  Transported to/from gym in w/c for time management and energy conservation. Gait training ~59fx3, seated break between, with min/mod assist of 1 and +2 for R HH min assist - demos ability to initiate L LE advancement during swing but requires max assist to complete, will often arch back to compensation for lack of LLE activation - demos improving L LE stance control with increased quad activation though still with intermittent buckling - demonstrates improving L LE motor recruitment during gait - continues to require frequent cuing to attend to task and not becoming internally or externally distracted.  Standing mini-squats 2x10-15 reps with mirror feedback and min/mod assist of 1 targeting L LE NMR with WBing for hip/knee extensor activation - demonstrates quad activation with ability to complete small range knee extension though not consistently. Gait training ~3041fs above with continuing improvement in L LE motor recruitment to assist with improved swing and stance. Standing>tall kneeling on mat with B UE support on bench via +2 mod assist and therapist  manually facilitating L LE onto mat. In tall kneeling with mirror feedback performed reaching task targeting increased hip extension for upright posture and increased hip in midline as pt demonstrates R hip bias - min/mod assist for balance and +2 present for safety. Tall kneeling>quadruped>sitting EOM with mod assist of 1. R stand pivot EOM>w/c with min assist for balance and pt compensating with decreased L LE WBing. R hemi-technique ~35f26fwards gym door with supervision and cuing to attend to task. Transported back to room. Pt requesting to return to bed. L stand pivot with mod assist in this direction as pt unable to complete L lateral step to complete transfer. Sit>supine min assist for L LE management. Pt left supine in bed with needs in reach, bed alarm on, and L hemibody positioned with pillows.  Therapy Documentation Precautions:  Precautions Precautions: Fall,Other (comment) Precaution Comments: L hemiplegia, L inattention Restrictions Weight Bearing Restrictions: No  Pain: Continues to report shoulder pain but unchanged and no intervention necessary at this time.   Therapy/Group: Individual Therapy  CarlTawana ScaleT, DPT, CSRS  05/09/2020, 3:23 PM

## 2020-05-09 NOTE — Progress Notes (Signed)
Patient ID: Bobby Branch, male   DOB: 1960-07-31, 59 y.o.   MRN: [id number] Team Conference Report to Patient/Family  Team Conference discussion was reviewed with the patient and caregiver, including goals, any changes in plan of care and target discharge date.  Patient and caregiver express understanding and are in agreement.  The patient has a target discharge date of 05/22/20.  SW followed up with pt and called pt daughter via telephone. Pt still not sleeping reports could be due to working 3rd shift. Dtr inquiring on status of Medicaid and Disability, pt reports he received a call today from [place] from [facility] [employer] [facility]. Sw will cont to follow up.  Dyanne Iha 05/09/2020, 1:19 PM

## 2020-05-09 NOTE — Progress Notes (Incomplete)
Physical Therapy Session Note  Patient Details  Name: Bobby Branch MRN: [id number] Date of Birth: Oct 15, 1960  {CHL IP REHAB PT TIME CALCULATION:[id number]}  Short Term Goals: Week 3:  PT Short Term Goal 1 (Week 3): Pt will perform sit to stand min assist. PT Short Term Goal 2 (Week 3): Pt will perform bed mobility min assist. PT Short Term Goal 3 (Week 3): Pt will perform bed to chair transfer min assist. PT Short Term Goal 4 (Week 3): Pt will ambulate >35f with LRAD with mod assist +2.  Skilled Therapeutic Interventions/Progress Updates:      Therapy Documentation Precautions:  Precautions Precautions: Fall,Other (comment) Precaution Comments: L hemiplegia, L inattention Restrictions Weight Bearing Restrictions: No    Therapy/Group: Individual Therapy  KEleonore Chiquito SPT 05/09/2020, 7:44 AM

## 2020-05-10 LAB — GLUCOSE, CAPILLARY
Glucose-Capillary: 122 mg/dL — ABNORMAL HIGH (ref 70–99)
Glucose-Capillary: 162 mg/dL — ABNORMAL HIGH (ref 70–99)
Glucose-Capillary: 182 mg/dL — ABNORMAL HIGH (ref 70–99)
Glucose-Capillary: 116 mg/dL — ABNORMAL HIGH (ref 70–99)

## 2020-05-10 MED ORDER — TEMAZEPAM 7.5 MG PO CAPS
7.5000 mg | ORAL_CAPSULE | Freq: Every evening | ORAL | Status: DC | PRN
  Administered 2020-05-13 – 2020-05-20 (×4): 7.5 mg via ORAL
  Filled 2020-05-10 (×4): qty 1

## 2020-05-10 NOTE — Progress Notes (Signed)
PROGRESS NOTE   Subjective/Complaints:  Tried trazodone but states he did not sleep well   ROS:   Pt denies SOB, abd pain, CP, N/V/C/D, and vision changes   Objective:   No results found. No results for input(s): WBC, HGB, HCT, PLT in the last 72 hours. No results for input(s): NA, K, CL, CO2, GLUCOSE, BUN, CREATININE, CALCIUM in the last 72 hours.  Intake/Output Summary (Last 24 hours) at 05/10/2020 0812 Last data filed at 05/09/2020 2155 Gross per 24 hour  Intake 580 ml  Output 1000 ml  Net -420 ml        Physical Exam: Vital Signs Blood pressure 130/69, pulse 85, temperature 98.2 F (36.8 C), resp. rate 20, height '5\' 6"'$  (1.676 m), weight 49.2 kg, SpO2 98 %.  General: No acute distress Mood and affect are appropriate Heart: Regular rate and rhythm no rubs murmurs or extra sounds Lungs: Clear to auscultation, breathing unlabored, no rales or wheezes Abdomen: Positive bowel sounds, soft nontender to palpation, nondistended Extremities: No clubbing, cyanosis, or edema Skin: No evidence of breakdown, no evidence of rash  Motor:  RUE/RLE: 5/5 proximal distal  LUE/LLE: 0/5 proximal distal Increased tone noted in left upper extremity , flexor withdrawal LLE   Assessment/Plan: 1. Functional deficits which require 3+ hours per day of interdisciplinary therapy in a comprehensive inpatient rehab setting.  Physiatrist is providing close team supervision and 24 hour management of active medical problems listed below.  Physiatrist and rehab team continue to assess barriers to discharge/monitor patient progress toward functional and medical goals  Care Tool:  Bathing    Body parts bathed by patient: Left arm,Chest,Abdomen,Front perineal area,Right lower leg,Right upper leg,Left upper leg,Face   Body parts bathed by helper: Left lower leg,Buttocks,Right arm     Bathing assist Assist Level: Moderate Assistance -  Patient 50 - 74%     Upper Body Dressing/Undressing Upper body dressing   What is the patient wearing?: Pull over shirt    Upper body assist Assist Level: Minimal Assistance - Patient > 75%    Lower Body Dressing/Undressing Lower body dressing      What is the patient wearing?: Pants,Incontinence brief     Lower body assist Assist for lower body dressing: Moderate Assistance - Patient 50 - 74%     Toileting Toileting    Toileting assist Assist for toileting: Moderate Assistance - Patient 50 - 74%     Transfers Chair/bed transfer  Transfers assist     Chair/bed transfer assist level: Moderate Assistance - Patient 50 - 74%     Locomotion Ambulation   Ambulation assist   Ambulation activity did not occur: Safety/medical concerns  Assist level: 2 helpers (+2 mod A) Assistive device: Hand held assist Max distance: 50FT   Walk 10 feet activity   Assist  Walk 10 feet activity did not occur: Safety/medical concerns  Assist level: 2 helpers Assistive device: Hand held assist   Walk 50 feet activity   Assist Walk 50 feet with 2 turns activity did not occur: Safety/medical concerns  Assist level: 2 helpers Assistive device: Hand held assist    Walk 150 feet activity   Assist Walk 150  feet activity did not occur: Safety/medical concerns         Walk 10 feet on uneven surface  activity   Assist Walk 10 feet on uneven surfaces activity did not occur: Safety/medical concerns         Wheelchair     Assist Will patient use wheelchair at discharge?: Yes Type of Wheelchair: Manual    Wheelchair assist level: Supervision/Verbal cueing Max wheelchair distance: 45f    Wheelchair 50 feet with 2 turns activity    Assist    Wheelchair 50 feet with 2 turns activity did not occur: Safety/medical concerns   Assist Level: Minimal Assistance - Patient > 75%   Wheelchair 150 feet activity     Assist  Wheelchair 150 feet activity did  not occur: Safety/medical concerns       Blood pressure 130/69, pulse 85, temperature 98.2 F (36.8 C), resp. rate 20, height '5\' 6"'$  (1.676 m), weight 49.2 kg, SpO2 98 %. Medical Problem List and Plan: 1.  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  Impaired mobility: -DVT/anticoagulation: Conitnue SCDs             -antiplatelet therapy: Continue Aspirin 325 mg daily and Plavix 75 mg daily x3 months 3. Left shoulder pain: Tylenol as needed. Add voltaren gel and lidocaine patch.   4/2-patient reports pain is controlled-continue regimen 4. Depression: Continue Lexapro 10 mg daily             -antipsychotic agents: N/A 5. Neuropsych: This patient is capable of making decisions on his own behalf. 6. Skin/Wound Care: Routine skin checks 7. Fluids/Electrolytes/Nutrition: Routine in and outs poor intake po fluid  8.  Hypertension.  BP has been labile- continue Norvasc 10 mg daily.  Monitor with increased mobility   Vitals:   05/09/20 1954 05/10/20 0432  BP: (!) 147/88 130/69  Pulse: 89 85  Resp: 14 20  Temp: 97.7 F (36.5 C) 98.2 F (36.8 C)  SpO2: 99% 98%  increase apresoline , BP improving  9.  Diabetes mellitus with hyperglycemia.  Hemoglobin A1c 7.3.  SSI.  Patient on Glucophage 500 mg twice daily prior to admission.  Resume as needed CBG (last 3)  Recent Labs    05/09/20 1730 05/09/20 2118 05/10/20 0621  GLUCAP 142* 202* 122*     SOme labilit but overall improved   4/3- start Tradjenta 5 mg daily and monitor 10.  Hyperlipidemia: Lipitor 11.  Polysubstance abuse.  Urine drug screen positive cocaine as well as marijuana.  ask neuropsych to provide  Counseling, pt plans to attend NA as OP   Tobacco abuse added nicoderm patch 733m 12.  CKD stage  III.    Creatinine 1.95 on 3/22,up to 2.44 on 3/28, 2.43 on 3/31, improved to 1.86 on 4/4 cont IVF qhs  13. 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 Scheduled at 11pm as per patient's request. Will not increase during day due to grogginess this morning.  15.  Slow transit constipation    16.  Insomnia- will trial restoril   LOS: 17 days A FACE TO FACE EVALUATION WAS PERFORMED  AnCharlett Blake/08/2020, 8:12 AM

## 2020-05-10 NOTE — Progress Notes (Signed)
Speech Language Pathology Weekly Progress and Session Note  Patient Details  Name: Bobby Branch MRN: [id number] Date of Birth: 04-17-1960  Beginning of progress report period: May 03, 2020 End of progress report period: May 10, 2020  Today's Date: 05/10/2020 SLP Individual Time: 0905-1000    Short Term Goals: Week 2: SLP Short Term Goal 1 (Week 2): Pt will demonstate ability to use compensatory memory strategies for semi-complex tasks/recall with Supervision A verbal/visual cues. SLP Short Term Goal 1 - Progress (Week 2): Progressing toward goal SLP Short Term Goal 2 (Week 2): Pt will selectively attend to tasks with Min A cues for redirection. SLP Short Term Goal 2 - Progress (Week 2): Updated due to goal met SLP Short Term Goal 3 (Week 2): Pt will demonstrate ability to problem solve mildly complex to complex situations with Min A cues. SLP Short Term Goal 3 - Progress (Week 2): Updated due to goal met SLP Short Term Goal 4 (Week 2): Pt will detect fuctional errors with Min A cues. SLP Short Term Goal 4 - Progress (Week 2): Progressing toward goal SLP Short Term Goal 5 (Week 2): Pt will anticipate 2 ADLs he will need assistance with at d/c and/or 2 ways to prevent falls at home with Min A cues SLP Short Term Goal 5 - Progress (Week 2): Progressing toward goal    New Short Term Goals: Week 3: SLP Short Term Goal 1 (Week 3): Pt will demonstate ability to use compensatory memory strategies for semi-complex tasks/recall with Supervision A verbal/visual cues. SLP Short Term Goal 2 (Week 3): Pt will anticipate 2 ADLs he will need assistance with at d/c and/or 2 ways to prevent falls at home with Min A cues. SLP Short Term Goal 3 (Week 3): Pt will selectively attend to tasks with Supervision cues for redirection. SLP Short Term Goal 4 (Week 3): Pt will demonstrate ability to problem solve mildly complex to complex situations with Supervision. SLP Short Term Goal 5 (Week 3): Pt will detect  fuctional errors with Min A cues.  Weekly Progress Updates:  Pt has made excellent gains and has met 2 of 5 STG's this reporting period due to improved problem solving and selective attention. He is frequently distracted during sessions but demonstrates improvement in ability to be redirected to tasks. Suspected that this is patients baseline and personality.  Currently, pt continues to to require Mod A and verbal direction with reasoning his discharge plan and new physical limitations. Family friend reports patients cognition is near baseline. Patient and family education is ongoing. Pt would benefit from continued skilled SLP intervention to maximize safety awareness and problem solving in order to increased safety and functional independence for discharge home.    Intensity: Minumum of 1-2 x/day, 30 to 90 minutes Frequency: 3 to 5 out of 7 days Duration/Length of Stay: 05/22/20 DC to home. Treatment/Interventions: Cognitive remediation/compensation;Internal/external aids;Cueing hierarchy;Patient/family education;Functional tasks   Daily Session  Skilled SLP intervention focused on cognition. New employee present in room to observe this session. Increased drooling noted from left side of mouth this session. Pt stated this was not new and started after his first CVA. Pt completed problem solving task with calendar requiring him to scan monthly calendar to locate total times for exercise. He completed task with moderate assistance with verbal redirection to task due to pt getting distracted by his own thoughts. Selective attention task with cards completed with Supervision A once familiar with task instructions. He demonstrated adequate processing speed for completion  of game and did not require cues to redirect to task due to getting distracted. Pt left seated upright in bed with call button within reach and bed alarm set. Cont with therapy per plan of care.  General    Pain Pain Assessment Pain  Scale: Faces Faces Pain Scale: No hurt  Therapy/Group: Individual Therapy  Gregary Signs A Tevyn Codd 05/10/2020, 8:01 AM

## 2020-05-10 NOTE — Progress Notes (Signed)
Physical Therapy Session Note  Patient Details  Name: Bobby Branch MRN: [id number] Date of Birth: [birth year]  Today's Date: 05/10/2020 PT Individual Time: 1000-1055 PT Individual Time Calculation (min): 55 min   Short Term Goals: Week 3:  PT Short Term Goal 1 (Week 3): Pt will perform sit to stand min assist. PT Short Term Goal 2 (Week 3): Pt will perform bed mobility min assist. PT Short Term Goal 3 (Week 3): Pt will perform bed to chair transfer min assist. PT Short Term Goal 4 (Week 3): Pt will ambulate >67f with LRAD with mod assist +2.  Skilled Therapeutic Interventions/Progress Updates:   Patient received sitting up in bed, agreeable to PT. He denies pain, but reports that his back was sore after therapy yesterday. MinA bed mobility to come sit edge of bed. RN present to disconnect IV for PT session. MaxA stand pivot to wc leading R. PT transporting patient in wc to therapy gym for time management and energy conservation. Standing NMR task with appropriate weight bearing through L LE, multi modal cues to extend L knee and weight shift as needed with basketball task. Patient able to intermittently achieve appropriate L knee extension, but with dual task involved, he would often drift into too much flexion. He ambulated 565f 257fith MaxA + R hallway handrail + wc follow. MaxA needed to advance L LE though patient was able to initiate swing minimally at the hip. MaxA needed to prevent L knee buckling in stance. Patient with difficulty maintaining appropriate L foot alignment throughout gait cycle and would often result in ER of L LE. Patient requesting to return to bed due to fatigue. MaxA stand pivot to bed, MinA to return supine. Bed alarm on, call light within reach.     Therapy Documentation Precautions:  Precautions Precautions: Fall,Other (comment) Precaution Comments: L hemiplegia, L inattention Restrictions Weight Bearing Restrictions: No    Therapy/Group: Individual  Therapy  JenKaroline CaldwellT, DPT, CBIS  05/10/2020, 7:59 AM

## 2020-05-10 NOTE — Progress Notes (Signed)
Occupational Therapy Weekly Progress Note  Patient Details  Name: Bobby Branch MRN: [id number] Date of Birth: 1960/10/05  Beginning of progress report period: May 04, 2020 End of progress report period: May 10, 2020  Today's Date: 05/10/2020 OT Individual Time: 8841-6606 OT Individual Time Calculation (min): 73 min    Patient has met 2 of 4 short term goals.  Mr. Aydelott continues to demonstrate good progress with OT at this time.  Currently, he is able to complete UB bathing and dressing at min assist level overall.  LB bathing and dressing are at mod assist.  Transfers squat pivot are at min to mod assist depending on direction with stand pivots at mod to max assist level.  He continues to exhibit moderate left hemiparesis with increased tone noted in the pectoral, digit flexors, and elbow flexors.  He is currently tolerating wearing a resting hand splint on the left hand at night to help with digit and wrist positioning.  Decreased sustained attention is still present as pt will get internally distracted with conversation, requiring mod to max instructional cueing to stay on task during ADL.  Feel overall he is on track for min assist level goals with expected discharge on 4/19 with continued HHOT recommended.  At this time, we are unsure that pt's significant other can provide the current level of assist he will need as pt has stated she is not reliable.  Will continue to monitor situation but at this time, recommend continued CIR level therapy.    Patient continues to demonstrate the following deficits: muscle weakness and muscle paralysis, impaired timing and sequencing, abnormal tone, unbalanced muscle activation and decreased coordination, decreased attention to left, decreased attention, decreased awareness and decreased memory and decreased sitting balance, decreased standing balance, decreased postural control, hemiplegia and decreased balance strategies and therefore will continue to benefit  from skilled OT intervention to enhance overall performance with BADL and Reduce care partner burden.  Patient progressing toward long term goals..  Continue plan of care.  OT Short Term Goals Week 3:  OT Short Term Goal 1 (Week 3): Pt will maintain sustained attention to selfcare tasks for 15 mins with no more than min instructional cueing for re-direction. OT Short Term Goal 2 (Week 3): Pt will integrate the LUE into bathing tasks as a stabilizer with mod assist and mod instructional cueing. OT Short Term Goal 3 (Week 3): Pt will complete bathing sit to stand with min assist. OT Short Term Goal 4 (Week 3): Pt will donn pullover shirt with supervision following hemi technique.  Skilled Therapeutic Interventions/Progress Updates:    Pt completed bathing and dressing during session.  Min assist for supine to sit EOB with max assist for ambulation from the bed to the shower bench.  Max assist for advancement of the LLE as well as for stabilization of the left knee when weightbearing.  He was able to complete bathing with overall min assist for UB and mod assist for LB.  Max instructional cueing for sustained attention to task as he would get distracted with his own conversation.  He was able to transfer to the wheelchair on the right stand pivot with mod assist in order to complete dressing tasks.  Mod assist for donning pullover shirt as well as for donning brief and pants.  He needed max assist to donn his gripper socks.  Finished session with pt sitting in the wheelchair and with the call button and phone in reach.  Resting hand splint placed on  the LUE for positioning with safety belt in place as well.   Therapy Documentation Precautions:  Precautions Precautions: Fall,Other (comment) Precaution Comments: L hemiplegia, L inattention Restrictions Weight Bearing Restrictions: No    Pain: Pain Assessment Pain Scale: Faces Faces Pain Scale: Hurts a little bit Pain Type: Neuropathic pain Pain  Location: Shoulder Pain Orientation: Left Pain Descriptors / Indicators: Discomfort Pain Onset: With Activity Pain Intervention(s): Repositioned;Emotional support ADL: See Care Tool Section for some details of mobility and selfcare  Therapy/Group: Individual Therapy  Aislyn Hayse OTR/L 05/10/2020, 4:17 PM

## 2020-05-11 LAB — GLUCOSE, CAPILLARY
Glucose-Capillary: 131 mg/dL — ABNORMAL HIGH (ref 70–99)
Glucose-Capillary: 163 mg/dL — ABNORMAL HIGH (ref 70–99)
Glucose-Capillary: 109 mg/dL — ABNORMAL HIGH (ref 70–99)
Glucose-Capillary: 116 mg/dL — ABNORMAL HIGH (ref 70–99)

## 2020-05-11 NOTE — Progress Notes (Signed)
Patient was lowered to the floor at 1300 this afternoon. Patient was transferred to the bed from the stedy, while pulling the stedy away, patient reached for the wheelchair to place the wheelchair leg in the wheelchair and started slipping. Was caught and head and shoulder protected, lowered to the floor. Called for team, neurovitals assessed, complains of a little soreness to right hip, refused x-ray when asked by PA, Linna Hoff, who was called into the room at 1302. Family notified, Patient is with therapy at this time. Charge nurse notified.

## 2020-05-11 NOTE — Progress Notes (Signed)
Occupational Therapy Session Note  Patient Details  Name: Bobby Branch MRN: [id number] Date of Birth: [birth year]  Today's Date: 05/11/2020 OT Individual Time: 1301-1400 OT Individual Time Calculation (min): 59 min    Short Term Goals: Week 3:  OT Short Term Goal 1 (Week 3): Pt will maintain sustained attention to selfcare tasks for 15 mins with no more than min instructional cueing for re-direction. OT Short Term Goal 2 (Week 3): Pt will integrate the LUE into bathing tasks as a stabilizer with mod assist and mod instructional cueing. OT Short Term Goal 3 (Week 3): Pt will complete bathing sit to stand with min assist. OT Short Term Goal 4 (Week 3): Pt will donn pullover shirt with supervision following hemi technique.  Skilled Therapeutic Interventions/Progress Updates:    Pt completed supine to sit EOB with min assist.  He was able to transfer to the wheelchair with mod assist stand pivot.  Once in the gym, he was able to transfer to the therapy mat at mod assist level.  Applied NMES to the left shoulder for treatment of pain and subluxation.  Intensity on level 28-30 for 20 mins without any adverse reactions.  Parameters at 35 PPS, 300 pulse width, with on/off time at 10 seconds.  NMES was also applied to the digit extensors as well to help decrease increased tone for 10 mins at the same setting.  While stimulation was active, had pt work on sit to stand and standing balance with functional reaching.  Mod assist needed to maintain standing balance while reaching down and to the left of midline with the RUE to pick up playing cards and place them on the velcro board. He completed several intervals of this with mod to max instructional cueing for selective attention secondary to distraction.  Finished session with transfer back to the wheelchair and then back to the bed at mod assist level stand pivot.  Resting hand splint placed on the LUE for positioning of the left hand secondary to increased  tone.     Therapy Documentation Precautions:  Precautions Precautions: Fall,Other (comment) Precaution Comments: L hemiplegia, L inattention Restrictions Weight Bearing Restrictions: No  Pain: Pain Assessment Pain Scale: Faces Pain Score: 0-No pain ADL: See Care Tool Section for some details of mobility and selfcare  Therapy/Group: Individual Therapy  Kathlynn Swofford OTR/L 05/11/2020, 4:09 PM

## 2020-05-11 NOTE — Progress Notes (Signed)
PROGRESS NOTE   Subjective/Complaints:  No issues overnite, slept a little better but was up at 3a.  Took some naps yesterday , taking ~100% meals   ROS:   Pt denies SOB, abd pain, CP, N/V/C/D, and vision changes   Objective:   No results found. No results for input(s): WBC, HGB, HCT, PLT in the last 72 hours. No results for input(s): NA, K, CL, CO2, GLUCOSE, BUN, CREATININE, CALCIUM in the last 72 hours.  Intake/Output Summary (Last 24 hours) at 05/11/2020 0729 Last data filed at 05/11/2020 0430 Gross per 24 hour  Intake 300 ml  Output 800 ml  Net -500 ml        Physical Exam: Vital Signs Blood pressure (!) 156/85, pulse 69, temperature 98.3 F (36.8 C), resp. rate 18, height '5\' 6"'$  (1.676 m), weight 49.2 kg, SpO2 99 %.  General: No acute distress Mood and affect are appropriate Heart: Regular rate and rhythm no rubs murmurs or extra sounds Lungs: Clear to auscultation, breathing unlabored, no rales or wheezes Abdomen: Positive bowel sounds, soft nontender to palpation, nondistended Extremities: No clubbing, cyanosis, or edema Skin: No evidence of breakdown, no evidence of rash  Motor:  RUE/RLE: 5/5 proximal distal  LUE/LLE: 0/5 proximal distal Increased tone noted in left upper extremity , flexor withdrawal LLE   Assessment/Plan: 1. Functional deficits which require 3+ hours per day of interdisciplinary therapy in a comprehensive inpatient rehab setting.  Physiatrist is providing close team supervision and 24 hour management of active medical problems listed below.  Physiatrist and rehab team continue to assess barriers to discharge/monitor patient progress toward functional and medical goals  Care Tool:  Bathing    Body parts bathed by patient: Left arm,Chest,Abdomen,Front perineal area,Right lower leg,Right upper leg,Left upper leg,Face,Buttocks   Body parts bathed by helper: Right arm,Left lower leg      Bathing assist Assist Level: Moderate Assistance - Patient 50 - 74%     Upper Body Dressing/Undressing Upper body dressing   What is the patient wearing?: Pull over shirt    Upper body assist Assist Level: Minimal Assistance - Patient > 75%    Lower Body Dressing/Undressing Lower body dressing      What is the patient wearing?: Pants,Incontinence brief     Lower body assist Assist for lower body dressing: Moderate Assistance - Patient 50 - 74%     Toileting Toileting    Toileting assist Assist for toileting: Moderate Assistance - Patient 50 - 74%     Transfers Chair/bed transfer  Transfers assist     Chair/bed transfer assist level: Maximal Assistance - Patient 25 - 49%     Locomotion Ambulation   Ambulation assist   Ambulation activity did not occur: Safety/medical concerns  Assist level: Maximal Assistance - Patient 25 - 49% Assistive device: No Device Max distance: 10'   Walk 10 feet activity   Assist  Walk 10 feet activity did not occur: Safety/medical concerns  Assist level: 2 helpers Assistive device: Hand held assist   Walk 50 feet activity   Assist Walk 50 feet with 2 turns activity did not occur: Safety/medical concerns  Assist level: 2 helpers Assistive device: Hand  held assist    Walk 150 feet activity   Assist Walk 150 feet activity did not occur: Safety/medical concerns         Walk 10 feet on uneven surface  activity   Assist Walk 10 feet on uneven surfaces activity did not occur: Safety/medical concerns         Wheelchair     Assist Will patient use wheelchair at discharge?: Yes Type of Wheelchair: Manual    Wheelchair assist level: Supervision/Verbal cueing Max wheelchair distance: 41f    Wheelchair 50 feet with 2 turns activity    Assist    Wheelchair 50 feet with 2 turns activity did not occur: Safety/medical concerns   Assist Level: Minimal Assistance - Patient > 75%   Wheelchair 150  feet activity     Assist  Wheelchair 150 feet activity did not occur: Safety/medical concerns       Blood pressure (!) 156/85, pulse 69, temperature 98.3 F (36.8 C), resp. rate 18, height '5\' 6"'$  (1.676 m), weight 49.2 kg, SpO2 99 %. Medical Problem List and Plan: 1.  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  Impaired mobility: -DVT/anticoagulation: Conitnue SCDs             -antiplatelet therapy: Continue Aspirin 325 mg daily and Plavix 75 mg daily x3 months 3. Left shoulder pain: Tylenol as needed. Add voltaren gel and lidocaine patch.   4/2-patient reports pain is controlled-continue regimen 4. Depression: Continue Lexapro 10 mg daily             -antipsychotic agents: N/A 5. Neuropsych: This patient is capable of making decisions on his own behalf. 6. Skin/Wound Care: Routine skin checks 7. Fluids/Electrolytes/Nutrition: Routine in and outs poor intake po fluid  8.  Hypertension.  BP has been labile- continue Norvasc 10 mg daily.  Monitor with increased mobility   Vitals:   05/10/20 1943 05/11/20 0343  BP: 120/69 (!) 156/85  Pulse: 72 69  Resp: 16 18  Temp: 97.6 F (36.4 C) 98.3 F (36.8 C)  SpO2: 99% 99%  increase apresoline , BP improving  9.  Diabetes mellitus with hyperglycemia.  Hemoglobin A1c 7.3.  SSI.  Patient on Glucophage 500 mg twice daily prior to admission.  Resume as needed CBG (last 3)  Recent Labs    05/10/20 1620 05/10/20 2038 05/11/20 0615  GLUCAP 182* 116* 131*     Controlled 4/8  4/3- start Tradjenta 5 mg daily and monitor 10.  Hyperlipidemia: Lipitor 11.  Polysubstance abuse.  Urine drug screen positive cocaine as well as marijuana.  ask neuropsych to provide  Counseling, pt plans to attend [facility] as OP   Tobacco  abuse added nicoderm patch 714m 12.  CKD stage III.    Creatinine 1.95 on 3/22,up to 2.44 on 3/28, 2.43 on 3/31, improved to 1.86 on 4/4 Stop IVF and enc po  13. 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 Scheduled at 11pm as per patient's request. Will not increase during day due to grogginess this morning.  15.  Slow transit constipation    16.  Insomnia- will trial restoril   LOS: 18 days A FACE TO FACE EVALUATION WAS PERFORMED  AnCharlett Blake/09/2020, 7:29 AM

## 2020-05-11 NOTE — Progress Notes (Signed)
Occupational Therapy Session Note  Patient Details  Name: Bobby Branch MRN: [id number] Date of Birth: 05/05/1960  Today's Date: 05/11/2020 OT Individual Time: 0945-1030 OT Individual Time Calculation (min): 45 min    Short Term Goals: Week 3:  OT Short Term Goal 1 (Week 3): Pt will maintain sustained attention to selfcare tasks for 15 mins with no more than min instructional cueing for re-direction. OT Short Term Goal 2 (Week 3): Pt will integrate the LUE into bathing tasks as a stabilizer with mod assist and mod instructional cueing. OT Short Term Goal 3 (Week 3): Pt will complete bathing sit to stand with min assist. OT Short Term Goal 4 (Week 3): Pt will donn pullover shirt with supervision following hemi technique.  Skilled Therapeutic Interventions/Progress Updates:    Pt received in bed and declined b/d this session. His shirt was dirty so he agreed to change which he was able to do with min A. Pt sat to EOB with min A to mobilize his LLE. Worked on squat pivot to the R to wc focusing on head/hip ratio and forward lean. Taken to gym and continued this practice to the mat and along the mat.  Pt eventually was able to progress to lifting his hips high enough for increased clearance to allow for a smooth transfers with only min to CGA.  On mat, worked on LUE PROM focusing on fingers and wrist as he has tightness due to increased tone. Worked on prom table top slides and pt attempted to actively move arm, but unable to.    Transferred back to wc, taken back to room to rest until next session. Belt alarm on and all needs met.  Therapy Documentation Precautions:  Precautions Precautions: Fall,Other (comment) Precaution Comments: L hemiplegia, L inattention Restrictions Weight Bearing Restrictions: No  Pain: Pain Assessment Pain Score: 0-No pain ADL: ADL Eating: Set up Where Assessed-Eating: Bed level Grooming: Supervision/safety Where Assessed-Grooming: Chair Upper Body  Bathing: Moderate assistance Where Assessed-Upper Body Bathing: Chair Lower Body Bathing: Maximal assistance Where Assessed-Lower Body Bathing: Chair Upper Body Dressing: Maximal assistance Where Assessed-Upper Body Dressing: Chair Lower Body Dressing: Maximal assistance Where Assessed-Lower Body Dressing: Chair Toileting: Maximal assistance Where Assessed-Toileting: Bedside Commode Toilet Transfer: Maximal assistance Toilet Transfer Method: Stand pivot Toilet Transfer Equipment: Bedside commode Tub/Shower Transfer: Not assessed Walk-In Shower Transfer: Not assessed  Therapy/Group: Individual Therapy  SAGUIER,JULIA 05/11/2020, 1:01 PM 

## 2020-05-11 NOTE — Plan of Care (Deleted)
Care plan progressing normally. Bobby Branch

## 2020-05-11 NOTE — Progress Notes (Signed)
   05/11/20 1300  What Happened  Was fall witnessed? Yes  Who witnessed fall? Wells Guiles, RN  Patients activity before fall to/from bed, chair, or stretcher  Point of contact hip/leg (left)  Was patient injured? Unsure (patient states no, none noted upon inspection of hip, refused x-ray at this time)  Follow Up  MD notified Linna Hoff, [place] notified  Time MD notified [place]  Family notified Yes - comment (wife)  Time family notified 1335  Additional tests No (refused xray at this time)  Simple treatment Other (comment) (refused)  Progress note created (see row info) Yes  Adult Fall Risk Assessment  Risk Factor Category (scoring not indicated) Fall has occurred during this admission (document High fall risk)  Patient Fall Risk Level High fall risk  Adult Fall Risk Interventions  Required Bundle Interventions *See Row Information* High fall risk - low, moderate, and high requirements implemented  Additional Interventions Use of appropriate toileting equipment (bedpan, BSC, etc.)  Screening for Fall Injury Risk (To be completed on HIGH fall risk patients) - Assessing Need for Floor Mats  Risk For Fall Injury- Criteria for Floor Mats None identified - No additional interventions needed  Will Implement Floor Mats Yes  Vitals  Temp 98.5 F (36.9 C)  Temp Source Oral  BP (!) 172/81  MAP (mmHg) 106  BP Location Right Arm  BP Method Automatic  Patient Position (if appropriate) Lying  Pulse Rate 75  Pulse Rate Source Dinamap  Resp 17  Oxygen Therapy  SpO2 100 %  O2 Device Room Air  Pain Assessment  Pain Scale 0-10  Pain Score 0  Neurological  Neuro (WDL) X  Level of Consciousness Alert  Orientation Level Oriented X4  Cognition Follows commands  Speech Clear  R Hand Grip Moderate  L Hand Grip Absent   RUE Motor Response Purposeful movement  LUE Motor Response Non-purposeful movement  RLE Motor Response Purposeful movement  LLE Motor Response Non-purposeful movement  Musculoskeletal   Musculoskeletal (WDL) X  Assistive Device Stedy  Weight Bearing Restrictions No  Musculoskeletal Details  LUE Ortho/Supportive Device Splint  Integumentary  Integumentary (WDL) X  Skin Color Appropriate for ethnicity  Skin Condition Dry  Skin Integrity Intact  Skin Turgor Non-tenting

## 2020-05-11 NOTE — Progress Notes (Signed)
Physical Therapy Session Note  Patient Details  Name: Bobby Branch MRN: [id number] Date of Birth: 03-Oct-1960  Today's Date: 05/11/2020 PT Individual Time: 1045-1200 PT Individual Time Calculation (min): 75 min   Short Term Goals: Week 3:  PT Short Term Goal 1 (Week 3): Pt will perform sit to stand min assist. PT Short Term Goal 2 (Week 3): Pt will perform bed mobility min assist. PT Short Term Goal 3 (Week 3): Pt will perform bed to chair transfer min assist. PT Short Term Goal 4 (Week 3): Pt will ambulate >22f with LRAD with mod assist +2.  Skilled Therapeutic Interventions/Progress Updates:    Patient received in [place] RN present to transfer to toilet. He denies pain. Patient continent of bowel and required MinA with perihygiene in standing, MaxA for clothing management in standing. Patient transferring back to wc via Stedy. Hand hygiene completed at sink with MinA to integrate L UE. PT transporting patient in wc to therapy gym for time management and energy conservation. Patient ambulating on treadmill with LiteGait total of 5325fand 45m86m. MaxA to advance L LE, though patient was able to initiate step from the hip. DF wrap applied to assist with foot clearance through swing phase. She did require MaxA to maintain L knee extension in stance. He did require multiple seated rest breaks throughout gait training due to fatigue. Patient ambulating 63f3fer ground with MaxA x2 and MaxA to advance L LE. Good carryover of initiating L hip flexion through swing phase noted. Patient returning to room in wc, seatbelt alarm on, call light within reach.   Therapy Documentation Precautions:  Precautions Precautions: Fall,Other (comment) Precaution Comments: L hemiplegia, L inattention Restrictions Weight Bearing Restrictions: No    Therapy/Group: Individual Therapy  JennKaroline Caldwell, DPT, CBIS  05/11/2020, 7:37 AM

## 2020-05-12 LAB — GLUCOSE, CAPILLARY
Glucose-Capillary: 99 mg/dL (ref 70–99)
Glucose-Capillary: 116 mg/dL — ABNORMAL HIGH (ref 70–99)
Glucose-Capillary: 182 mg/dL — ABNORMAL HIGH (ref 70–99)
Glucose-Capillary: 115 mg/dL — ABNORMAL HIGH (ref 70–99)

## 2020-05-12 NOTE — Progress Notes (Signed)
Patient complained of pain to the left hip. PRN medication administered. Continues to refuse x-ray. Patient resting in bed, call bell within reach.

## 2020-05-12 NOTE — Progress Notes (Signed)
Physical Therapy Session Note  Patient Details  Name: Bobby Branch MRN: [id number] Date of Birth: December 04, 1960  Today's Date: 05/12/2020 PT Individual Time: [id number] PT Individual Time Calculation (min): 47 min   Short Term Goals: Week 3:  PT Short Term Goal 1 (Week 3): Pt will perform sit to stand min assist. PT Short Term Goal 2 (Week 3): Pt will perform bed mobility min assist. PT Short Term Goal 3 (Week 3): Pt will perform bed to chair transfer min assist. PT Short Term Goal 4 (Week 3): Pt will ambulate >51f with LRAD with mod assist +2.  Skilled Therapeutic Interventions/Progress Updates:    Pt received sleeping supine in bed. When woken, reported L hip pain from fall sustained yesterday, but agreeable to therapy. Pants donned max assist; pt unable to activate LLE to place in pant hole. Hooklying position assumed and therapist facilitated LLE WB throughout knee and verbally cued pt to bridge in order to finishing donning pants; able to bridge for ~5sec bouts. Supine to sitting EOB min assist with therapist facilitating trunk control. STS min assist with L knee blocking to prevent buckling. Pt able to complete donning pants independently in standing. Stand pivot transfer min assist towards R to WC. Wheeled to therapy gym for time management. L squat pivot transfer to therapy mat min assist with therapist facilitating L knee blocking. STS min assist with L knee blocking and cuing to lean forward prior to standing. Pt is better able to lean forward at trunk and hips when verbally cued than in previous sessions. To address L inattention to promote L knee ext, pt reached for clothespin on L side of body, stepped forward with R foot, and reached up and forward towards basketball net to clip pin. Brief L quad contraction felt when pt forced to reach at farthest capacity outside of BOS without stepping. Therapist facilitation required to extend hips and rotate pelvic to neutral as pt assumed L rotated  pelvic position in standing today. VC to "look up" and "bring hips forward." Pt began to complain of recurring low back spasms. Transfer[facility] R squat [facility]. Wheeled to room for time management. Transferred L squat pivot min assist to sitting EOB with same cues/technique as before. Min assist to transfer to supine. Pt attempted to pull himself up in bed with R arm, but placed in hooklying and cued to extend through legs while therapist facilitated LLE WB at knee; pt able to successfully do this after verbalizing he was "too tired." Bed alarm turned on and needs within reach. Pt on phone with daughter at end of session.  Therapy Documentation Precautions:  Precautions Precautions: Fall,Other (comment) Precaution Comments: L hemiplegia, L inattention Restrictions Weight Bearing Restrictions: No   Therapy/Group: Individual Therapy  KEleonore Chiquito SPT 05/12/2020, 4:36 PM

## 2020-05-12 NOTE — Progress Notes (Signed)
Speech Language Pathology Daily Session Note  Patient Details  Name: Bobby Branch MRN: [id number] Date of Birth: 13-Feb-1960  Today's Date: 05/12/2020 SLP Individual Time: 1415-1500 SLP Individual Time Calculation (min): 45 min  Short Term Goals: Week 3: SLP Short Term Goal 1 (Week 3): Pt will demonstate ability to use compensatory memory strategies for semi-complex tasks/recall with Supervision A verbal/visual cues. SLP Short Term Goal 2 (Week 3): Pt will anticipate 2 ADLs he will need assistance with at d/c and/or 2 ways to prevent falls at home with Min A cues. SLP Short Term Goal 3 (Week 3): Pt will selectively attend to tasks with Supervision cues for redirection. SLP Short Term Goal 4 (Week 3): Pt will demonstrate ability to problem solve mildly complex to complex situations with Supervision. SLP Short Term Goal 5 (Week 3): Pt will detect fuctional errors with Min A cues.  Skilled Therapeutic Interventions:   Patient seen for skilled ST session to address cognitive and speech goals. Upon SLP entering room, patient immediately points to telesitter and says he does not want that in the room. When SLP and RN explained why, patient stated that he wanted privacy and didn't want that spying on him. He also explained how both of recent falls were accidental and not because he was trying to get out of bed or do anything unsafe. Patient's speech was 100% intelligible at conversational level. He demonstrated good recall of names of therapists, ability to describe specifically what he had done in therapy sessions. In terms of anticipatory awareness, patient stated that his nephew will help in evenings after work and his ex girlfriend is offering to help during the day but he states that she isn't willing to actually help. Patient is hopeful he will get some return of his left leg function so he can do some things on his own but he does seem to understand need for 24 hour supervision and assistance. Patient  very verbose but this is likely baseline. SLP had to frequently ask clarification of what he was saying as he would jump around in conversation or not provide information for context/background of what he was talking about. Patient continues to benefit from skilled SLP intervention to maximize cognitive function prior to discharge.  Pain Pain Assessment Pain Scale: 0-10 Pain Score: 4  Faces Pain Scale: No hurt Pain Type: Chronic pain Pain Location: Hip Pain Orientation: Left Pain Descriptors / Indicators: Aching Pain Intervention(s): Medication (See eMAR)  Therapy/Group: Individual Therapy  Sonia Baller, MA, CCC-SLP Speech Therapy

## 2020-05-13 LAB — GLUCOSE, CAPILLARY
Glucose-Capillary: 100 mg/dL — ABNORMAL HIGH (ref 70–99)
Glucose-Capillary: 104 mg/dL — ABNORMAL HIGH (ref 70–99)
Glucose-Capillary: 136 mg/dL — ABNORMAL HIGH (ref 70–99)
Glucose-Capillary: 203 mg/dL — ABNORMAL HIGH (ref 70–99)

## 2020-05-13 LAB — BASIC METABOLIC PANEL
Anion gap: 7 (ref 5–15)
BUN: 27 mg/dL — ABNORMAL HIGH (ref 6–20)
CO2: 25 mmol/L (ref 22–32)
Calcium: 8.8 mg/dL — ABNORMAL LOW (ref 8.9–10.3)
Chloride: 104 mmol/L (ref 98–111)
Creatinine, Ser: 1.96 mg/dL — ABNORMAL HIGH (ref 0.61–1.24)
GFR, Estimated: 39 mL/min — ABNORMAL LOW (ref >60)
Glucose, Bld: 227 mg/dL — ABNORMAL HIGH (ref 70–99)
Potassium: 3.8 mmol/L (ref 3.5–5.1)
Sodium: 136 mmol/L (ref 135–145)

## 2020-05-13 NOTE — Progress Notes (Signed)
PROGRESS NOTE   Subjective/Complaints:  Discussed fluid intake pt drinks glucerna and H2O , pt states he is drinking more than what is recorded   ROS:   Pt denies SOB, abd pain, CP, N/V/C/D, and vision changes   Objective:   No results found. No results for input(s): WBC, HGB, HCT, PLT in the last 72 hours. No results for input(s): NA, K, CL, CO2, GLUCOSE, BUN, CREATININE, CALCIUM in the last 72 hours.  Intake/Output Summary (Last 24 hours) at 05/13/2020 0636 Last data filed at 05/13/2020 0500 Gross per 24 hour  Intake 177 ml  Output 1050 ml  Net -873 ml        Physical Exam: Vital Signs Blood pressure (!) 157/88, pulse 76, temperature 98.2 F (36.8 C), temperature source Oral, resp. rate 18, height '5\' 6"'$  (1.676 m), weight 49.2 kg, SpO2 99 %.  General: No acute distress Mood and affect are appropriate Heart: Regular rate and rhythm no rubs murmurs or extra sounds Lungs: Clear to auscultation, breathing unlabored, no rales or wheezes Abdomen: Positive bowel sounds, soft nontender to palpation, nondistended Extremities: No clubbing, cyanosis, or edema Skin: No evidence of breakdown, no evidence of rash Motor:  RUE/RLE: 5/5 proximal distal  LUE/LLE: 0/5 proximal distal Increased tone noted in left upper extremity , flexor withdrawal LLE   Assessment/Plan: 1. Functional deficits which require 3+ hours per day of interdisciplinary therapy in a comprehensive inpatient rehab setting.  Physiatrist is providing close team supervision and 24 hour management of active medical problems listed below.  Physiatrist and rehab team continue to assess barriers to discharge/monitor patient progress toward functional and medical goals  Care Tool:  Bathing    Body parts bathed by patient: Left arm,Chest,Abdomen,Front perineal area,Right lower leg,Right upper leg,Left upper leg,Face,Buttocks   Body parts bathed by helper: Right  arm,Left lower leg     Bathing assist Assist Level: Moderate Assistance - Patient 50 - 74%     Upper Body Dressing/Undressing Upper body dressing   What is the patient wearing?: Pull over shirt    Upper body assist Assist Level: Minimal Assistance - Patient > 75%    Lower Body Dressing/Undressing Lower body dressing      What is the patient wearing?: Pants,Incontinence brief     Lower body assist Assist for lower body dressing: Moderate Assistance - Patient 50 - 74%     Toileting Toileting    Toileting assist Assist for toileting: Moderate Assistance - Patient 50 - 74%     Transfers Chair/bed transfer  Transfers assist     Chair/bed transfer assist level: Minimal Assistance - Patient > 75%     Locomotion Ambulation   Ambulation assist   Ambulation activity did not occur: Safety/medical concerns  Assist level: 2 helpers Assistive device: Hand held assist Max distance: 20   Walk 10 feet activity   Assist  Walk 10 feet activity did not occur: Safety/medical concerns  Assist level: 2 helpers Assistive device: Hand held assist   Walk 50 feet activity   Assist Walk 50 feet with 2 turns activity did not occur: Safety/medical concerns  Assist level: 2 helpers Assistive device: Hand held assist  Walk 150 feet activity   Assist Walk 150 feet activity did not occur: Safety/medical concerns         Walk 10 feet on uneven surface  activity   Assist Walk 10 feet on uneven surfaces activity did not occur: Safety/medical concerns         Wheelchair     Assist Will patient use wheelchair at discharge?: Yes Type of Wheelchair: Manual    Wheelchair assist level: Supervision/Verbal cueing Max wheelchair distance: 23f    Wheelchair 50 feet with 2 turns activity    Assist    Wheelchair 50 feet with 2 turns activity did not occur: Safety/medical concerns   Assist Level: Minimal Assistance - Patient > 75%   Wheelchair 150 feet  activity     Assist  Wheelchair 150 feet activity did not occur: Safety/medical concerns       Blood pressure (!) 157/88, pulse 76, temperature 98.2 F (36.8 C), temperature source Oral, resp. rate 18, height '5\' 6"'$  (1.676 m), weight 49.2 kg, SpO2 99 %. Medical Problem List and Plan: 1.  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  Impaired mobility: -DVT/anticoagulation: Conitnue SCDs             -antiplatelet therapy: Continue Aspirin 325 mg daily and Plavix 75 mg daily x3 months 3. Left shoulder pain: Tylenol as needed. Add voltaren gel and lidocaine patch.   4/2-patient reports pain is controlled-continue regimen 4. Depression: Continue Lexapro 10 mg daily             -antipsychotic agents: N/A 5. Neuropsych: This patient is capable of making decisions on his own behalf. 6. Skin/Wound Care: Routine skin checks 7. Fluids/Electrolytes/Nutrition: Routine in and outs poor intake po fluid , only 177 po in on 4/9  8.  Hypertension.  BP has been labile- continue Norvasc 10 mg daily.  Monitor with increased mobility   Vitals:   05/12/20 1942 05/13/20 0458  BP: (!) 124/109 (!) 157/88  Pulse: 81 76  Resp: 17 18  Temp: 97.6 F (36.4 C) 98.2 F (36.8 C)  SpO2: 99% 99%  increase apresoline , BP improving  9.  Diabetes mellitus with hyperglycemia.  Hemoglobin A1c 7.3.  SSI.  Patient on Glucophage 500 mg twice daily prior to admission.  Resume as needed CBG (last 3)  Recent Labs    05/12/20 1647 05/12/20 2059 05/13/20 0602  GLUCAP 182* 115* 100*     Controlled 4/10  4/3- start Tradjenta 5 mg daily and monitor 10.  Hyperlipidemia: Lipitor 11.  Polysubstance abuse.  Urine drug screen positive cocaine as well as marijuana.  ask neuropsych to provide   Counseling, pt plans to attend NA as OP   Tobacco abuse added nicoderm patch 755m 12.  CKD stage III.    Creatinine 1.95 on 3/22,up to 2.44 on 3/28, 2.43 on 3/31, improved to 1.86 on 4/4 Stop IVF and enc po- recheck in am   13. 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 Scheduled at 11pm as per patient's request. Will not increase during day due to grogginess this morning.  15.  Slow transit constipation    16.  Insomnia- will trial restoril- improved   LOS: 20 days A FACE TO FACE EVALUATION WAS PERFORMED  Luanna Salk Ryli Standlee 05/13/2020, 6:36 AM

## 2020-05-14 LAB — GLUCOSE, CAPILLARY
Glucose-Capillary: 98 mg/dL (ref 70–99)
Glucose-Capillary: 147 mg/dL — ABNORMAL HIGH (ref 70–99)
Glucose-Capillary: 111 mg/dL — ABNORMAL HIGH (ref 70–99)
Glucose-Capillary: 178 mg/dL — ABNORMAL HIGH (ref 70–99)

## 2020-05-14 NOTE — Progress Notes (Signed)
PROGRESS NOTE   Subjective/Complaints:  Pt c/o L shoulder and L hip pain.  Tylenol helps a little- lidoderm patches per pt are helpful.  L elbow/L wrist tight per pt- had discussed Botox  For L side.    ROS:    Pt denies SOB, abd pain, CP, N/V/C/D, and vision changes   Objective:   No results found. No results for input(s): WBC, HGB, HCT, PLT in the last 72 hours. Recent Labs    05/13/20 0854  NA 136  K 3.8  CL 104  CO2 25  GLUCOSE 227*  BUN 27*  CREATININE 1.96*  CALCIUM 8.8*    Intake/Output Summary (Last 24 hours) at 05/14/2020 0840 Last data filed at 05/14/2020 0817 Gross per 24 hour  Intake 840 ml  Output 1450 ml  Net -610 ml        Physical Exam: Vital Signs Blood pressure 139/70, pulse 79, temperature 97.8 F (36.6 C), temperature source Oral, resp. rate 20, height '5\' 6"'$  (1.676 m), weight 49.2 kg, SpO2 99 %.   General: awake, alert, appropriate, laying curled up in bed;  NAD HENT: conjugate gaze; oropharynx moist CV: regular rate; no JVD Pulmonary: CTA B/L; no W/R/R- good air movement GI: soft, NT, ND, (+)BS Psychiatric: appropriate Neurological: Ox3 Motor:  RUE/RLE: 5/5 proximal distal  LUE/LLE: 0/5 proximal distal Increased tone noted in left upper extremity MAS 1+ to 2 on L elbow and L wrist.  , flexor withdrawal LLE   Assessment/Plan: 1. Functional deficits which require 3+ hours per day of interdisciplinary therapy in a comprehensive inpatient rehab setting.  Physiatrist is providing close team supervision and 24 hour management of active medical problems listed below.  Physiatrist and rehab team continue to assess barriers to discharge/monitor patient progress toward functional and medical goals  Care Tool:  Bathing    Body parts bathed by patient: Left arm,Chest,Abdomen,Front perineal area,Right lower leg,Right upper leg,Left upper leg,Face,Buttocks   Body parts bathed by  helper: Right arm,Left lower leg     Bathing assist Assist Level: Moderate Assistance - Patient 50 - 74%     Upper Body Dressing/Undressing Upper body dressing   What is the patient wearing?: Pull over shirt    Upper body assist Assist Level: Minimal Assistance - Patient > 75%    Lower Body Dressing/Undressing Lower body dressing      What is the patient wearing?: Pants,Incontinence brief     Lower body assist Assist for lower body dressing: Moderate Assistance - Patient 50 - 74%     Toileting Toileting    Toileting assist Assist for toileting: Moderate Assistance - Patient 50 - 74%     Transfers Chair/bed transfer  Transfers assist     Chair/bed transfer assist level: Minimal Assistance - Patient > 75%     Locomotion Ambulation   Ambulation assist   Ambulation activity did not occur: Safety/medical concerns  Assist level: 2 helpers Assistive device: Hand held assist Max distance: 20   Walk 10 feet activity   Assist  Walk 10 feet activity did not occur: Safety/medical concerns  Assist level: 2 helpers Assistive device: Hand held assist   Walk 50 feet activity  Assist Walk 50 feet with 2 turns activity did not occur: Safety/medical concerns  Assist level: 2 helpers Assistive device: Hand held assist    Walk 150 feet activity   Assist Walk 150 feet activity did not occur: Safety/medical concerns         Walk 10 feet on uneven surface  activity   Assist Walk 10 feet on uneven surfaces activity did not occur: Safety/medical concerns         Wheelchair     Assist Will patient use wheelchair at discharge?: Yes Type of Wheelchair: Manual    Wheelchair assist level: Supervision/Verbal cueing Max wheelchair distance: 3f    Wheelchair 50 feet with 2 turns activity    Assist    Wheelchair 50 feet with 2 turns activity did not occur: Safety/medical concerns   Assist Level: Minimal Assistance - Patient > 75%    Wheelchair 150 feet activity     Assist  Wheelchair 150 feet activity did not occur: Safety/medical concerns       Blood pressure 139/70, pulse 79, temperature 97.8 F (36.6 C), temperature source Oral, resp. rate 20, height '5\' 6"'$  (1.676 m), weight 49.2 kg, SpO2 99 %. Medical Problem List and Plan: 1.  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  Impaired mobility: -DVT/anticoagulation: Conitnue SCDs             -antiplatelet therapy: Continue Aspirin 325 mg daily and Plavix 75 mg daily x3 months 3. Left shoulder pain: Tylenol as needed. Add voltaren gel and lidocaine patch.   4/2-patient reports pain is controlled-continue regimen 4. Depression: Continue Lexapro 10 mg daily             -antipsychotic agents: N/A 5. Neuropsych: This patient is capable of making decisions on his own behalf. 6. Skin/Wound Care: Routine skin checks 7. Fluids/Electrolytes/Nutrition: Routine in and outs poor intake po fluid , only 177 po in on 4/9  8.  Hypertension.  BP has been labile- continue Norvasc 10 mg daily.  Monitor with increased mobility   4/11- con't BP meds- BP controlled overall.  Vitals:   05/14/20 0006 05/14/20 0354  BP: (!) 147/79 139/70  Pulse: 78 79  Resp:  20  Temp:  97.8 F (36.6 C)  SpO2:  99%  increase apresoline , BP improving  9.  Diabetes mellitus with hyperglycemia.  Hemoglobin A1c 7.3.  SSI.  Patient on Glucophage 500 mg twice daily prior to admission.  Resume as needed CBG (last 3)  Recent Labs    05/13/20 1628 05/13/20 2125 05/14/20 0630  GLUCAP 136* 203* 98     4/11- overall controlled- con't regimen  4/3- start Tradjenta 5 mg daily and monitor 10.  Hyperlipidemia: Lipitor 11.  Polysubstance abuse.  Urine drug screen positive  cocaine as well as marijuana.  ask neuropsych to provide  Counseling, pt plans to attend NA as OP   Tobacco abuse added nicoderm patch 750m 12.  CKD stage III.    Creatinine 1.95 on 3/22,up to 2.44 on 3/28, 2.43 on 3/31, improved to 1.86 on 4/4 Stop IVF and enc po- recheck in am   4/11- Cr up to 1.96 from 1.86- however overall drinking per pt/nursing 13. 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 Scheduled at  11pm as per patient's request. Will not increase during day due to grogginess this morning.  15.  Slow transit constipation    16.  Insomnia- will trial restoril- improved   LOS: 21 days A FACE TO FACE EVALUATION WAS PERFORMED  Earl Losee 05/14/2020, 8:40 AM

## 2020-05-14 NOTE — Progress Notes (Signed)
Nutrition Follow-up  DOCUMENTATION CODES:   Underweight,Severe malnutrition in context of social or environmental circumstances  INTERVENTION:   -ContinueGlucerna Shake po TID, each supplement provides 220 kcal and 10 grams of protein  -ContinueMVI with minerals daily  - Encourage adequate PO intake  NUTRITION DIAGNOSIS:   Severe Malnutrition related to social / environmental circumstances (hx of polysubstance abuse) as evidenced by severe fat depletion,severe muscle depletion.  Ongoing, being addressed via supplements  GOAL:   Patient will meet greater than or equal to 90% of their needs  Progressing  MONITOR:   PO intake,Supplement acceptance,Labs,Weight trends  REASON FOR ASSESSMENT:   Other (low BMI)    ASSESSMENT:   60 year old male with PMH of prior CVA in August 2021, DM, CKD stage III, HTN, HLD, polysubstance abuse. Presented 04/20/20 with acute onset of left-sided weakness. Pt found to have right MCA stroke. Admitted to [facility] on 3/21.  Spoke with pt at bedside. He reports that his appetite remains good. He continues to eat well with most meal completions documented as 100%. He reports that he is consuming at least 2 Glucerna shakes daily.  RD obtained updated bed weight: 129 lbs. This is an 21 lb weight gain since admission to CIR on 3/21. Question is CIR admission weight of 108 lbs is accurate. Weight gain is favorable given severe malnutrition.  Meal Completion: 75-100%  Medications reviewed and include: colace, pepcid, Glucerna Shake TID, SSI, tradjenta, MVI with minerals, miralax, senna  Labs reviewed: BUN 27, creatinine 1.96 CBG's: 89-178 x 24 hours  UOP: 1125 ml x 24 hours I/O's: -6.5 L since admit  Diet Order:   Diet Order            Diet Carb Modified Fluid consistency: Thin; Room service appropriate? Yes  Diet effective now                 EDUCATION NEEDS:   No education needs have been identified at this time  Skin:  Skin  Assessment: Skin Integrity Issues: Incisions: right thigh  Last BM:  05/15/20 small type 2  Height:   Ht Readings from Last 1 Encounters:  04/23/20 '5\' 6"'$  (1.676 m)    Weight:   Wt Readings from Last 1 Encounters:  05/15/20 58.5 kg    BMI:  Body mass index is 20.82 kg/m.  Estimated Nutritional Needs:   Kcal:  1800-2000  Protein:  80-95 grams  Fluid:  1.8 L/day    Gustavus Bryant, MS, RD, LDN Inpatient Clinical Dietitian Please see AMiON for contact information.

## 2020-05-14 NOTE — Progress Notes (Signed)
Speech Language Pathology Daily Session Note  Patient Details  Name: Bobby Branch MRN: [id number] Date of Birth: 1960/08/14  Today's Date: 05/14/2020 SLP Individual Time: 1100-1158 SLP Individual Time Calculation (min): 58 min  Short Term Goals: Week 3: SLP Short Term Goal 1 (Week 3): Pt will demonstate ability to use compensatory memory strategies for semi-complex tasks/recall with Supervision A verbal/visual cues. SLP Short Term Goal 2 (Week 3): Pt will anticipate 2 ADLs he will need assistance with at d/c and/or 2 ways to prevent falls at home with Min A cues. SLP Short Term Goal 3 (Week 3): Pt will selectively attend to tasks with Supervision cues for redirection. SLP Short Term Goal 4 (Week 3): Pt will demonstrate ability to problem solve mildly complex to complex situations with Supervision. SLP Short Term Goal 5 (Week 3): Pt will detect fuctional errors with Min A cues.  Skilled Therapeutic Interventions: Skilled SLP intervention focused on cognition. Pt continues to require moderate direction for reasoning discharge plan and ADLs he will need assistance completing. Pt eventually stated he is wondering how he will get from his bed to the bathroom. Deductive reasoning task completed with max A verbal cues for attention and direction with problem solving each step. Pt left seated upright in wheelchair with chair alarm set and call bell within reach. Cont with therapy per plan of care.      Pain Pain Assessment Pain Scale: Faces Pain Score: 0-No pain Faces Pain Scale: No hurt  Therapy/Group: Individual Therapy  Darrol Poke Laure Leone 05/14/2020, 11:48 AM

## 2020-05-14 NOTE — Progress Notes (Signed)
Occupational Therapy Session Note  Patient Details  Name: Bobby Branch MRN: [id number] Date of Birth: September 30, 1960  Today's Date: 05/14/2020 OT Individual Time: [id number] OT Individual Time Calculation (min): 46 min    Short Term Goals: Week 3:  OT Short Term Goal 1 (Week 3): Pt will maintain sustained attention to selfcare tasks for 15 mins with no more than min instructional cueing for re-direction. OT Short Term Goal 2 (Week 3): Pt will integrate the LUE into bathing tasks as a stabilizer with mod assist and mod instructional cueing. OT Short Term Goal 3 (Week 3): Pt will complete bathing sit to stand with min assist. OT Short Term Goal 4 (Week 3): Pt will donn pullover shirt with supervision following hemi technique.  Skilled Therapeutic Interventions/Progress Updates:    Pt in bed sleeping to start session.  He was easily awoken with transfer to the left side of the bed with min assist.  He then completed squat pivot transfer to the wheelchair with min assist for transport down to the therapy gym.  He completed transfer to the therapy mat at the same level with min assist squat pivot as well.  Therapist applied NMES pads to the left shoulder and tricep for pain relief, facilitation of subluxation and active functional movement of elbow extension.  He was able to tolerate simultaneous stimulation for 15 mins without any adverse reactions at intensity of 28 at the shoulder and 31 at the elbow.  On time 10 secs with off time 5 seconds. 35 PPS, with 300 pulse width.  Had pt work on activation of the elbow extensors and shoulder flexors while trying to push a tilted stool forward to target.  Mod assist needed to achieve full elbow extension. Returned to the room at conclusion of session with min assist transfer to the wheelchair and then for transfer back to the bed.  He was left with the call button in reach and resting hand splint placed on the LUE.  He continues to exhibit increased tone in the  elbow and digits of the left hand.    Therapy Documentation Precautions:  Precautions Precautions: Fall,Other (comment) Precaution Comments: L hemiplegia, L inattention Restrictions Weight Bearing Restrictions: No  Pain: Pain Assessment Pain Scale: Faces Pain Score: 0-No pain Faces Pain Scale: Hurts a little bit Pain Type: Acute pain Pain Location: Shoulder Pain Orientation: Left Pain Descriptors / Indicators: Discomfort Pain Onset: With Activity Pain Intervention(s): Repositioned ADL: See Care Tool Section for some details of mobility and selfcare  Therapy/Group: Individual Therapy  Theresia Pree OTR/L 05/14/2020, 3:58 PM

## 2020-05-14 NOTE — Progress Notes (Signed)
Physical Therapy Session Note  Patient Details  Name: Bobby Branch MRN: [id number] Date of Birth: 10/04/1960  Today's Date: 05/14/2020 PT Individual Time: 1000-1100 PT Individual Time Calculation (min): 60 min   Short Term Goals: Week 3:  PT Short Term Goal 1 (Week 3): Pt will perform sit to stand min assist. PT Short Term Goal 2 (Week 3): Pt will perform bed mobility min assist. PT Short Term Goal 3 (Week 3): Pt will perform bed to chair transfer min assist. PT Short Term Goal 4 (Week 3): Pt will ambulate >70f with LRAD with mod assist +2.  Skilled Therapeutic Interventions/Progress Updates:   Patient received supine in bed, agreeable to PT. He reports L shoulder and hip pain, but declined pain interventions. PT providing rest breaks, distractions and repositioning to assist with pain management. LB dressing at bed level completed with MaxA. Patient able to initiate L hip flexion to lift L LE off of bed. He came to sit edge of bed with MinA. UB dressing completed edge of bed with MinA. ModA squat pivot to wc. PT transporting patient in wc to therapy gym for time management and energy conservation. Gait traning with R hallway handrail completed [id number]. Wc follow for convenience. ModA for postural support + ModA/MaxA to advance L LE through swing phase. When patient takes longer R step (thereby longer L trailing limb) he is more successful at swinging L LE through. Patient with heavy reliance on R UE on handrail at times, but was receptive to cues to keep R UE at his waist for support, as though it were a cane, and not too far in front of him. He continues to demonstrate flexor withdrawal reflex in L LE at times limiting the stability of his L LE in stance. Patient completing 6 mins on NuStep with B LE only with improving ability to initiate extension with L LE noted. Also improving maintaining midline with L LE. Patient returning to room in wc, seatbelt alarm on, call light within reach.   Therapy  Documentation Precautions:  Precautions Precautions: Fall,Other (comment) Precaution Comments: L hemiplegia, L inattention Restrictions Weight Bearing Restrictions: No    Therapy/Group: Individual Therapy  JeKaroline CaldwellPT, DPT, CBIS  05/14/2020, 7:36 AM

## 2020-05-15 ENCOUNTER — Inpatient Hospital Stay (HOSPITAL_COMMUNITY): Payer: Medicaid Other

## 2020-05-15 LAB — GLUCOSE, CAPILLARY
Glucose-Capillary: 89 mg/dL (ref 70–99)
Glucose-Capillary: 132 mg/dL — ABNORMAL HIGH (ref 70–99)
Glucose-Capillary: 103 mg/dL — ABNORMAL HIGH (ref 70–99)
Glucose-Capillary: 189 mg/dL — ABNORMAL HIGH (ref 70–99)

## 2020-05-15 MED ORDER — HYDRALAZINE HCL 50 MG PO TABS
50.0000 mg | ORAL_TABLET | Freq: Three times a day (TID) | ORAL | Status: DC
  Administered 2020-05-15 – 2020-05-20 (×15): 50 mg via ORAL
  Filled 2020-05-15 (×15): qty 1

## 2020-05-15 NOTE — Progress Notes (Signed)
PROGRESS NOTE   Subjective/Complaints: Left shoulder pain started at 3am, states he developed Left shoulder pain ~22moprior to CVA, prior Left shoulder injury some years ago    ROS:    Pt denies SOB, abd pain, CP, N/V/C/D, and vision changes   Objective:   No results found. No results for input(s): WBC, HGB, HCT, PLT in the last 72 hours. Recent Labs    05/13/20 0854  NA 136  K 3.8  CL 104  CO2 25  GLUCOSE 227*  BUN 27*  CREATININE 1.96*  CALCIUM 8.8*    Intake/Output Summary (Last 24 hours) at 05/15/2020 0736 Last data filed at 05/15/2020 0214 Gross per 24 hour  Intake 560 ml  Output 1125 ml  Net -565 ml        Physical Exam: Vital Signs Blood pressure (!) 160/72, pulse 79, temperature (!) 97.5 F (36.4 C), resp. rate 17, height '5\' 6"'$  (1.676 m), weight 49.2 kg, SpO2 100 %.   General: awake, alert, appropriate, laying curled up in bed;  NAD HENT: conjugate gaze; oropharynx moist CV: regular rate; no JVD Pulmonary: CTA B/L; no W/R/R- good air movement GI: soft, NT, ND, (+)BS Psychiatric: appropriate Neurological: Ox3 Motor:  RUE/RLE: 5/5 proximal distal  LUE/LLE: 0/5 proximal distal Increased tone noted in left upper extremity MAS 1+ to 2 on L elbow and L wrist.  , flexor withdrawal LLE   Assessment/Plan: 1. Functional deficits which require 3+ hours per day of interdisciplinary therapy in a comprehensive inpatient rehab setting.  Physiatrist is providing close team supervision and 24 hour management of active medical problems listed below.  Physiatrist and rehab team continue to assess barriers to discharge/monitor patient progress toward functional and medical goals  Care Tool:  Bathing    Body parts bathed by patient: Left arm,Chest,Abdomen,Front perineal area,Right lower leg,Right upper leg,Left upper leg,Face,Buttocks   Body parts bathed by helper: Right arm,Left lower leg     Bathing  assist Assist Level: Moderate Assistance - Patient 50 - 74%     Upper Body Dressing/Undressing Upper body dressing   What is the patient wearing?: Pull over shirt    Upper body assist Assist Level: Minimal Assistance - Patient > 75%    Lower Body Dressing/Undressing Lower body dressing      What is the patient wearing?: Pants,Incontinence brief     Lower body assist Assist for lower body dressing: Moderate Assistance - Patient 50 - 74%     Toileting Toileting    Toileting assist Assist for toileting: Moderate Assistance - Patient 50 - 74%     Transfers Chair/bed transfer  Transfers assist     Chair/bed transfer assist level: Minimal Assistance - Patient > 75% (squat pivot)     Locomotion Ambulation   Ambulation assist   Ambulation activity did not occur: Safety/medical concerns  Assist level: Moderate Assistance - Patient 50 - 74% Assistive device: Hand held assist Max distance: 25   Walk 10 feet activity   Assist  Walk 10 feet activity did not occur: Safety/medical concerns  Assist level: Moderate Assistance - Patient - 50 - 74% Assistive device: Hand held assist   Walk 50 feet activity  Assist Walk 50 feet with 2 turns activity did not occur: Safety/medical concerns  Assist level: 2 helpers Assistive device: Hand held assist    Walk 150 feet activity   Assist Walk 150 feet activity did not occur: Safety/medical concerns         Walk 10 feet on uneven surface  activity   Assist Walk 10 feet on uneven surfaces activity did not occur: Safety/medical concerns         Wheelchair     Assist Will patient use wheelchair at discharge?: Yes Type of Wheelchair: Manual    Wheelchair assist level: Supervision/Verbal cueing Max wheelchair distance: 50f    Wheelchair 50 feet with 2 turns activity    Assist    Wheelchair 50 feet with 2 turns activity did not occur: Safety/medical concerns   Assist Level: Minimal Assistance  - Patient > 75%   Wheelchair 150 feet activity     Assist  Wheelchair 150 feet activity did not occur: Safety/medical concerns       Blood pressure (!) 160/72, pulse 79, temperature (!) 97.5 F (36.4 C), resp. rate 17, height '5\' 6"'$  (1.676 m), weight 49.2 kg, SpO2 100 %. Medical Problem List and Plan: 1.  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  Impaired mobility: -DVT/anticoagulation: Conitnue SCDs             -antiplatelet therapy: Continue Aspirin 325 mg daily and Plavix 75 mg daily x3 months 3. Left shoulder pain: Tylenol as needed. Add voltaren gel and lidocaine patch.   4/2-patient reports pain is controlled-continue regimen 4. Depression: Continue Lexapro 10 mg daily             -antipsychotic agents: N/A 5. Neuropsych: This patient is capable of making decisions on his own behalf. 6. Skin/Wound Care: Routine skin checks 7. Fluids/Electrolytes/Nutrition: Routine in and outs poor intake po fluid , only 177 po in on 4/9  8.  Hypertension.  BP has been labile- continue Norvasc 10 mg daily.  Monitor with increased mobility   4/11- con't BP meds- BP controlled overall.  Vitals:   05/14/20 1959 05/15/20 0420  BP: (!) 161/86 (!) 160/72  Pulse: 91 79  Resp: 18 17  Temp: 98.2 F (36.8 C) (!) 97.5 F (36.4 C)  SpO2: 99% 100%  increase apresoline , BP improving  9.  Diabetes mellitus with hyperglycemia.  Hemoglobin A1c 7.3.  SSI.  Patient on Glucophage 500 mg twice daily prior to admission.  Resume as needed CBG (last 3)  Recent Labs    05/14/20 1650 05/14/20 2102 05/15/20 0556  GLUCAP 111* 178* 89     4/12, controlled   4/3- start Tradjenta 5 mg daily and monitor  10.  Hyperlipidemia: Lipitor 11.  Polysubstance abuse.  Urine drug screen  positive cocaine as well as marijuana.  ask neuropsych to provide  Counseling, pt plans to attend [facility] as OP   Tobacco abuse added nicoderm patch 727m 12.  CKD stage III.    Creatinine 1.95 on 3/22,up to 2.44 on 3/28, 2.43 on 3/31, improved to 1.86 on 4/4 Stop IVF and enc po- recheck in am   4/12- Cr -at baseline  13. 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 Scheduled at 11pm as per patient's  request. Will not increase during day due to grogginess this morning.  15.  Slow transit constipation    16.  Insomnia- will trial restoril- improved  17.  Left shoulder pain preceded CVA check shoulder xray check for OA LOS: 22 days A FACE TO [place] [facility] Vinie Charity 05/15/2020, 7:36 AM

## 2020-05-15 NOTE — Progress Notes (Signed)
Speech Language Pathology Daily Session Note  Patient Details  Name: Bobby Branch MRN: [id number] Date of Birth: 04/17/1960  Today's Date: 05/15/2020 SLP Individual Time: 0920-1005 SLP Individual Time Calculation (min): 45 min  Short Term Goals: Week 3: SLP Short Term Goal 1 (Week 3): Pt will demonstate ability to use compensatory memory strategies for semi-complex tasks/recall with Supervision A verbal/visual cues. SLP Short Term Goal 2 (Week 3): Pt will anticipate 2 ADLs he will need assistance with at d/c and/or 2 ways to prevent falls at home with Min A cues. SLP Short Term Goal 3 (Week 3): Pt will selectively attend to tasks with Supervision cues for redirection. SLP Short Term Goal 4 (Week 3): Pt will demonstrate ability to problem solve mildly complex to complex situations with Supervision. SLP Short Term Goal 5 (Week 3): Pt will detect fuctional errors with Min A cues.  Skilled Therapeutic Interventions: Skilled SLP intervention focused on cognition. Pt completed selective attention card sorting task of increased complexity with min A for redirection to task due to pt getting distracted by his own thoughts. He was able to state 3 physical limitations with less verbal cueing and direction required but continues to state he will not need help from a caregiver once home. Min-mod A verbal cues required for reasoning with safety in home following discharge home. Educated pt on use of calendar to keep track of appointments and upcoming events. Pt verbalized understanding. SLP will speak with PT/OT to determine date for discharge education and plan for DC. Pt is at baseline for cognition.      Pain Pain Assessment Pain Scale: Faces Pain Score: 1  Faces Pain Scale: Hurts a little bit Pain Location: Shoulder Pain Orientation: Left Pain Descriptors / Indicators: Aching  Therapy/Group: Individual Therapy  Bobby Branch 05/15/2020, 11:01 AM

## 2020-05-15 NOTE — Progress Notes (Signed)
Physical Therapy Weekly Progress Note  Patient Details  Name: Bobby Branch MRN: [id number] Date of Birth: [birth year]  Beginning of progress report period: May 08, 2020 End of progress report period: May 15, 2020  Today's Date: 05/15/2020 PT Individual Time: 1410-1510 PT Individual Time Calculation (min): 60 min   Patient has met 3 of 4 short term goals.  Pt is advancing well with transfer goals, requiring less assist with sit to stand, chair to bed, and bed mobility in comparison to one week ago. Pt continues to require max assist during gait, but is hindered by decreased motor planning and limited LLE activation. However, pt has good potential to attain a greater distance during gait with increases in cardiovascular function. Pt is also still limited by L hemiparesis, poor attention to task, and L inattention. Pt will continue to benefit from wheelchair mobility training.  Patient continues to demonstrate the following deficits muscle weakness and muscle joint tightness, decreased cardiorespiratoy endurance, abnormal tone, decreased coordination and decreased motor planning, decreased attention to left and decreased motor planning, decreased initiation, decreased attention, decreased awareness, decreased problem solving and decreased safety awareness and decreased standing balance, decreased postural control and hemiplegia and therefore will continue to benefit from skilled PT intervention to increase functional independence with mobility.  Patient not progressing toward long term goals.  See goal revision..  Continue plan of care.  PT Short Term Goals Week 3:  PT Short Term Goal 1 (Week 3): Pt will perform sit to stand min assist. PT Short Term Goal 1 - Progress (Week 3): Met PT Short Term Goal 2 (Week 3): Pt will perform bed mobility min assist. PT Short Term Goal 2 - Progress (Week 3): Met PT Short Term Goal 3 (Week 3): Pt will perform bed to chair transfer min assist. PT Short Term  Goal 3 - Progress (Week 3): Met PT Short Term Goal 4 (Week 3): Pt will ambulate >45ft with LRAD with mod assist +2. PT Short Term Goal 4 - Progress (Week 3): Progressing toward goal Week 4:  PT Short Term Goal 1 (Week 4): =LTG  Skilled Therapeutic Interventions/Progress Updates:    Pt received supine in bed. Reported having L shoulder pain earlier in day, but "alright now." Agreeable to therapy, but lethargic throughout session. Supine to sit min assist; pt able to complete majority of transfer and needs assistance with terminal phase of coming upright. STS min assist with L knee guarding to avoid buckling, and stand pivot min assist R towards WC. Pt wheeled to therapy gym for time management. Transferred L to therapy mat squat pivot min assist with manual facilitation for WB through LLE. Pre-gait NMR via forward/backward stepping with LLE over ladder step (used as EC) to assist with L foot clearance during gait. Pt able to initiate beginning of stepping but requires max assist to advance LLE. VC to "shift hips towards R" when stepping. VC and manual facilitation to extend hips. When stepping forward, pt deferred to R single leg squatting rather than WB through LLE. Pt also extends low back to compensate for diminished hip/knee flex. Pt easily distracted during task and demonstrated difficulty performing. Gait training 2x50' max assist with therapist advancing LLE (pt able to somewhat initiate) and therapist facilitation hip shifting and extension as needed. No HHA or UE support given; pt felt greater difficulty with this and performed squatting-like motions occassionally throughout due to the decreased support. Wheeled to room for time management. Transferred L towards bed squat pivot min assist. Transferred  to supine min assist. Left in bed with bed alarm turned on and needs within reach.  Therapy Documentation Precautions:  Precautions Precautions: Fall,Other (comment) Precaution Comments: L  hemiplegia, L inattention Restrictions Weight Bearing Restrictions: No   Therapy/Group: Individual Therapy  Eleonore Chiquito, SPT 05/15/2020, 5:01 PM

## 2020-05-15 NOTE — Progress Notes (Signed)
Occupational Therapy Session Note  Patient Details  Name: Bobby Branch MRN: [id number] Date of Birth: [birth year]  Today's Date: 05/15/2020 OT Individual Time: 1112-1203 OT Individual Time Calculation (min): 51 min    Short Term Goals: Week 3:  OT Short Term Goal 1 (Week 3): Pt will maintain sustained attention to selfcare tasks for 15 mins with no more than min instructional cueing for re-direction. OT Short Term Goal 2 (Week 3): Pt will integrate the LUE into bathing tasks as a stabilizer with mod assist and mod instructional cueing. OT Short Term Goal 3 (Week 3): Pt will complete bathing sit to stand with min assist. OT Short Term Goal 4 (Week 3): Pt will donn pullover shirt with supervision following hemi technique.  Skilled Therapeutic Interventions/Progress Updates:    Pt worked on bathing and dressing during session.  He was able to complete supine to sit EOB with min assist and then ambulate to the shower with max assist.  Therapist had to provide left knee facilitation at max assist to keep it from buckling with each step.  Once in the shower, he needed mod assist for removal of LB clothing.  He was able to complete the shower with overall min assist sit to stand and mod instructional cueing for maintaining sustained attention and for sequencing.  Max assist was still needed for hand over hand assist to integrate the LUE for holding the soap bottle or washing the RUE.  Next, he was able to complete transfer stand pivot to the wheelchair with mod assist for work on dressing tasks.  Min assist for donning a pullover shirt with mod assist for donning brief and pants sit to stand.  He needed max assist for TEDs and gripper socks to finish the session.  Left pt up in the wheelchair in preparation for lunch with safety belt in place and call button and phone in reach.  LUE supported on half lap tray.    Therapy Documentation Precautions:  Precautions Precautions: Fall,Other  (comment) Precaution Comments: L hemiplegia, L inattention Restrictions Weight Bearing Restrictions: No  Pain: Pain Assessment Pain Scale: Faces Faces Pain Scale: Hurts a little bit Pain Type: Acute pain Pain Location: Shoulder Pain Orientation: Left Pain Descriptors / Indicators: Discomfort Pain Onset: With Activity Pain Intervention(s): Repositioned;Emotional support ADL: See Care Tool Section for some details of mobility and selfcare  Therapy/Group: Individual Therapy  Renita Brocks OTR/L 05/15/2020, 12:42 PM

## 2020-05-15 NOTE — Plan of Care (Cosign Needed)
  Problem: RH Ambulation Goal: LTG Patient will ambulate in home environment (PT) Description: LTG: Patient will ambulate in home environment, # of feet with assistance (PT). Outcome: Not Applicable Flowsheets (Taken 05/15/2020 1942) LTG: Pt will ambulate in home environ  assist needed:: (Do not anticipate pt will be safe functional ambulator in home with family) -- Note: Do not anticipate pt will be safe functional ambulator in home with family

## 2020-05-16 LAB — GLUCOSE, CAPILLARY
Glucose-Capillary: 86 mg/dL (ref 70–99)
Glucose-Capillary: 147 mg/dL — ABNORMAL HIGH (ref 70–99)
Glucose-Capillary: 218 mg/dL — ABNORMAL HIGH (ref 70–99)
Glucose-Capillary: 312 mg/dL — ABNORMAL HIGH (ref 70–99)

## 2020-05-16 MED ORDER — LIDOCAINE HCL (PF) 1 % IJ SOLN
5.0000 mL | Freq: Once | INTRAMUSCULAR | Status: AC
  Administered 2020-05-16: 5 mL
  Filled 2020-05-16: qty 30

## 2020-05-16 MED ORDER — BETAMETHASONE SOD PHOS & ACET 6 (3-3) MG/ML IJ SUSP
12.0000 mg | Freq: Once | INTRAMUSCULAR | Status: AC
  Administered 2020-05-16: 12 mg via INTRA_ARTICULAR
  Filled 2020-05-16: qty 2

## 2020-05-16 MED ORDER — LIDOCAINE HCL 1 % IJ SOLN
5.0000 mL | Freq: Once | INTRAMUSCULAR | Status: DC
  Filled 2020-05-16: qty 5

## 2020-05-16 NOTE — Progress Notes (Signed)
Occupational Therapy Session Note  Patient Details  Name: Bobby Branch MRN: [id number] Date of Birth: 12/14/1960  Today's Date: 05/16/2020 OT Individual Time: [id number] Individual Time Calculation (min): 58 min    Short Term Goals: Week 3:  OT Short Term Goal 1 (Week 3): Pt will maintain sustained attention to selfcare tasks for 15 mins with no more than min instructional cueing for re-direction. OT Short Term Goal 2 (Week 3): Pt will integrate the LUE into bathing tasks as a stabilizer with mod assist and mod instructional cueing. OT Short Term Goal 3 (Week 3): Pt will complete bathing sit to stand with min assist. OT Short Term Goal 4 (Week 3): Pt will donn pullover shirt with supervision following hemi technique.  Skilled Therapeutic Interventions/Progress Updates:    Pt in recliner to start session with MD in to complete left shoulder injection for pain.  He needed mod assist for removal of pullover shirt prior to injection and mod assist to donn it following hemi dressing techniques.  He was able to complete transfer stand pivot to the wheelchair on the left with mod assist.  Took him down via wheelchair to the tub room where he practiced tub/shower transfers with use of the tub bench based on his home setup.  Mod assist for squat pivot transfer and for lifting the LLE into and out of the tub.  Transitioned from the tub room to the gym where he transferred squat pivot to the mat a min assist and worked on some weightbearing through the LUE from seated position.  He needed mod assist with LUE placed beside of him on the mat to return to sitting from leaning on his left forearm.  Emphasis on activation of elbow extensors and decreasing right trunk assist.  Completed several repetitions before returning to the room and transferring back to the recliner at mod assist stand pivot to the right.  Call button and phone in reach with safety alarm belt in place and LUE positioned on pillows.     Therapy Documentation Precautions:  Precautions Precautions: Fall,Other (comment) Precaution Comments: L hemiplegia, L inattention Restrictions Weight Bearing Restrictions: No   Pain: Pain Assessment Pain Scale: Faces Pain Score: 3  Faces Pain Scale: Hurts a little bit Pain Type: Chronic pain Pain Location: Shoulder Pain Orientation: Left Pain Descriptors / Indicators: Discomfort Pain Onset: With Activity Pain Intervention(s): Repositioned ADL: See Care Tool Section for some details of mobility and selfcare  Therapy/Group: Individual Therapy  Gerald Honea OTR/L 05/16/2020, 12:19 PM

## 2020-05-16 NOTE — Progress Notes (Signed)
Patient ID: Bobby Branch, male   DOB: 06-Jan-1961, 60 y.o.   MRN: [id number]  Pt DME ordered through [place] with [facility] [place], [place]

## 2020-05-16 NOTE — Progress Notes (Signed)
Physical Therapy Session Note  Patient Details  Name: Bobby Branch MRN: [id number] Date of Birth: August 13, 1960  Today's Date: 05/16/2020 PT Individual Time: 1315-1431 PT Individual Time Calculation (min): 76 min   Short Term Goals: Week 4:  PT Short Term Goal 1 (Week 4): =LTG  Skilled Therapeutic Interventions/Progress Updates:    Pt received sitting in WC eating lunch. Does not report pain and agreeable to therapy. Wheeled to therapy gym for time management. Transfer to mat R squat pivot min assist with cuing for head hips relationship and therapist facilitation of WB through LLE. STS min assist with L knee block for buckling and facilitation for hip extension.  Pre-gait NMR with mirror feedback via R stepping onto and off of 4" step for increased L stance control. VC for L glute/quad activation and "shift weight onto L hip when stepping." Anterior and posterior L knee blocking required for buckling and hyperext; therapist glute facilitation still required, but able to feel L quad contraction during stepping. Follow-up gait training performed max assist for balance and to advance LLE without UE support for ~84f. L knee blocking and glute facilitation provided; pt able to perform weight shifting over LLE during L stance to advance RLE significantly better than he was able to this morning. Resulted in overall increased fluidity throughout gait and increased pt and therapist trust in LLE.  Pt wheeled to room for time management. On the way, pt verbalized that girlfriend will only be available for assistance at home "for a few days, maybe 5 days at most." Therapist educated pt that he will require 24/7 assistance for significantly longer than this amount of time. Pt transferred L to sitting EOB squat pivot min assist with same technique as earlier in session. NT in agreement to allow pt to sit EOB to complete meal. Meal tray set up to avoid anterior rolling for increased pt safety. Needs within reach  and be alarm turned on.  Therapy Documentation Precautions:  Precautions Precautions: Fall,Other (comment) Precaution Comments: L hemiplegia, L inattention Restrictions Weight Bearing Restrictions: No    Therapy/Group: Individual Therapy  KEleonore Chiquito SPT 05/16/2020, 7:32 PM

## 2020-05-16 NOTE — Progress Notes (Signed)
Shoulder injection Left    Indication:LEFT Shoulder pain not relieved by medication management and other conservative care.  Informed consent was obtained after describing risks and benefits of the procedure with the patient, this includes bleeding, bruising, infection and medication side effects. The patient wishes to proceed and has given written consent. Patient was placed in a seated position. TheLEFT shoulder was marked and prepped with betadine in the subacromial area. A 25-gauge 1-1/2 inch needle was inserted into the subacromial area. After negative draw back for blood, a solution containing 1 mL of 6 mg per ML betamethasone and 4 mL of 1% lidocaine was injected. A band aid was applied. The patient tolerated the procedure well. Post procedure instructions were given.

## 2020-05-16 NOTE — Progress Notes (Signed)
Physical Therapy Session Note  Patient Details  Name: Bobby Branch MRN: [id number] Date of Birth: Oct 14, 1960  Today's Date: 05/16/2020 PT Individual Time: 1007-1037 PT Individual Time Calculation (min): 30 min   Short Term Goals: Week 4:  PT Short Term Goal 1 (Week 4): =LTG  Skilled Therapeutic Interventions/Progress Updates:    Pt received supine in bed talking on the phone with his girlfriend. Pt agreeable to therapy session. Pt/girlfriend report that the discharge plan is for pt to return to his 1st floor apartment with level entry where his GF will move in with him to provide 24hr support. Pt's GF agreeable to attend hands-on family education/training this Saturday - notified Margreta Journey, SW during team conference of these updates. Supine>sitting L EOB, HOB flat but bedrails available, with CGA/min assist for trunk upright and increased time. Sitting EOB donned pants max assist for time management. Sit<>stands performed throughout session without R UE support to decrease compensatory strategies to promote increased L weight shift and L LE WBing for NMR but requires mod assist with these transfers - min assist to pull pants up over L hip. Returned to sitting EOB to perform R squat pivot to w/c to promote increased L LE WBing for NMR - completed transfer with min assist, not allowing pt to use R UE to decrease compensation as well.  Transported to/from gym in w/c for time management and energy conservation. Gait training ~21f, no UE support but +2 close for safety, with max assist for balance - continues to require max assist for L LE management during gait, requires facilitation for L hip/knee extension during stance with pt continuing to demonstrate L lateral and flexed trunk lean during L stance to compensate for lack of L hip/knee extension during stance - continues to demo ability to initiate L LE swing phase but requires max assist to advance and position prior to initiating stance. Pt agreeable  to remain in recliner until OT session. Transported back to room in w/c. R squat pivot w/c>recliner, mod assist due to not allowing pt to use R UE to compensate during task. Pt left sitting in recliner with needs in reach, B LEs elevated, and seat belt alarm on.  Therapy Documentation Precautions:  Precautions Precautions: Fall,Other (comment) Precaution Comments: L hemiplegia, L inattention Restrictions Weight Bearing Restrictions: No  Pain: Continues to state persistent L shoulder pain - distraction and repositioned provided for pain management.   Therapy/Group: Individual Therapy  CTawana Scale, PT, DPT, CSRS  05/16/2020, 7:55 AM

## 2020-05-16 NOTE — Progress Notes (Signed)
Patient ID: Bobby Branch, male   DOB: Sep 17, 1960, 60 y.o.   MRN: [id number] Team Conference Report to Patient/Family  Team Conference discussion was reviewed with the patient and caregiver, including goals, any changes in plan of care and target discharge date.  Patient and caregiver express understanding and are in agreement.  The patient has a target discharge date of 05/22/20.  Pt daughter concerned about pt having supervision at home, concerned girlfriend will leave pt home alone. SW provided [facility] resources to pt daughter. Daughter plans to have conversation with pt girlfriend. SW will cont to follow up  Dyanne Iha 05/16/2020, 1:32 PM

## 2020-05-16 NOTE — Progress Notes (Signed)
PROGRESS NOTE   Subjective/Complaints: Left shoulder pain , pt with nl xray, pt amenable to cortisone injection    ROS:    Pt denies SOB, abd pain, CP, N/V/C/D, and vision changes   Objective:   DG Shoulder 1V Left  Result Date: 05/15/2020 CLINICAL DATA:  Left shoulder pain, recent stroke 3 weeks ago EXAM: LEFT SHOULDER COMPARISON:  None. FINDINGS: Limited single view exam. No gross malalignment. Negative for fracture. AC joint aligned. Included left chest unremarkable. IMPRESSION: No definite acute finding by plain radiography Electronically Signed   By: Jerilynn Mages.  Shick M.D.   On: 05/15/2020 08:49   No results for input(s): WBC, HGB, HCT, PLT in the last 72 hours. Recent Labs    05/13/20 0854  NA 136  K 3.8  CL 104  CO2 25  GLUCOSE 227*  BUN 27*  CREATININE 1.96*  CALCIUM 8.8*    Intake/Output Summary (Last 24 hours) at 05/16/2020 0830 Last data filed at 05/16/2020 0745 Gross per 24 hour  Intake 514 ml  Output 650 ml  Net -136 ml        Physical Exam: Vital Signs Blood pressure 128/74, pulse 75, temperature 97.8 F (36.6 C), resp. rate 15, height '5\' 6"'$  (1.676 m), weight 58.5 kg, SpO2 97 %.   General: No acute distress Mood and affect are appropriate Heart: Regular rate and rhythm no rubs murmurs or extra sounds Lungs: Clear to auscultation, breathing unlabored, no rales or wheezes Abdomen: Positive bowel sounds, soft nontender to palpation, nondistended Extremities: No clubbing, cyanosis, or edema Skin: No evidence of breakdown, no evidence of rash  Motor:  RUE/RLE: 5/5 proximal distal  LUE/LLE: 0/5 proximal distal Increased tone noted in left upper extremity MAS 1+ to 2 on L elbow and L wrist.  , flexor withdrawal LLE   Assessment/Plan: 1. Functional deficits which require 3+ hours per day of interdisciplinary therapy in a comprehensive inpatient rehab setting.  Physiatrist is providing close team  supervision and 24 hour management of active medical problems listed below.  Physiatrist and rehab team continue to assess barriers to discharge/monitor patient progress toward functional and medical goals  Care Tool:  Bathing    Body parts bathed by patient: Left arm,Chest,Abdomen,Front perineal area,Right lower leg,Right upper leg,Left upper leg,Face,Buttocks   Body parts bathed by helper: Right arm,Left lower leg     Bathing assist Assist Level: Moderate Assistance - Patient 50 - 74%     Upper Body Dressing/Undressing Upper body dressing   What is the patient wearing?: Pull over shirt    Upper body assist Assist Level: Minimal Assistance - Patient > 75%    Lower Body Dressing/Undressing Lower body dressing      What is the patient wearing?: Pants,Incontinence brief     Lower body assist Assist for lower body dressing: Moderate Assistance - Patient 50 - 74%     Toileting Toileting    Toileting assist Assist for toileting: Moderate Assistance - Patient 50 - 74%     Transfers Chair/bed transfer  Transfers assist     Chair/bed transfer assist level: Minimal Assistance - Patient > 75%     Locomotion Ambulation   Ambulation assist  Ambulation activity did not occur: Safety/medical concerns  Assist level: Maximal Assistance - Patient 25 - 49% Assistive device: No Device Max distance: 56f   Walk 10 feet activity   Assist  Walk 10 feet activity did not occur: Safety/medical concerns  Assist level: Maximal Assistance - Patient 25 - 49% Assistive device: No Device   Walk 50 feet activity   Assist Walk 50 feet with 2 turns activity did not occur: Safety/medical concerns  Assist level: 2 helpers Assistive device: Hand held assist    Walk 150 feet activity   Assist Walk 150 feet activity did not occur: Safety/medical concerns         Walk 10 feet on uneven surface  activity   Assist Walk 10 feet on uneven surfaces activity did not  occur: Safety/medical concerns         Wheelchair     Assist Will patient use wheelchair at discharge?: Yes Type of Wheelchair: Manual    Wheelchair assist level: Supervision/Verbal cueing Max wheelchair distance: 351f   Wheelchair 50 feet with 2 turns activity    Assist    Wheelchair 50 feet with 2 turns activity did not occur: Safety/medical concerns   Assist Level: Minimal Assistance - Patient > 75%   Wheelchair 150 feet activity     Assist  Wheelchair 150 feet activity did not occur: Safety/medical concerns       Blood pressure 128/74, pulse 75, temperature 97.8 F (36.6 C), resp. rate 15, height '5\' 6"'$  (1.676 m), weight 58.5 kg, SpO2 97 %. Medical Problem List and Plan: 1.  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  Impaired mobility: -DVT/anticoagulation: Conitnue SCDs             -antiplatelet therapy: Continue Aspirin 325 mg daily and Plavix 75 mg daily x3 months 3. Left shoulder pain: Tylenol as needed. Add voltaren gel and lidocaine patch.   4/2-patient reports pain is controlled-continue regimen 4. Depression: Continue Lexapro 10 mg daily             -antipsychotic agents: N/A 5. Neuropsych: This patient is capable of making decisions on his own behalf. 6. Skin/Wound Care: Routine skin checks 7. Fluids/Electrolytes/Nutrition: Routine in and outs poor intake po fluid , only 177 po in on 4/9  8.  Hypertension.  BP has been labile- continue Norvasc 10 mg daily.  Monitor with increased mobility   4/11- con't BP meds- BP controlled overall.  Vitals:   05/15/20 1840 05/16/20 0434  BP: 113/70 128/74  Pulse: 84 75  Resp: 18 15  Temp: 97.8 F (36.6 C) 97.8 F (36.6 C)  SpO2: 98% 97%  increase apresoline , BP improving  9.   Diabetes mellitus with hyperglycemia.  Hemoglobin A1c 7.3.  SSI.  Patient on Glucophage 500 mg twice daily prior to admission.  Resume as needed CBG (last 3)  Recent Labs    05/15/20 1628 05/15/20 2101 05/16/20 0551  GLUCAP 103* 189* 86     4/13 controlled   4/3- start Tradjenta 5 mg daily and monitor  10.  Hyperlipidemia: Lipitor 11.  Polysubstance abuse.  Urine drug screen positive cocaine as well as marijuana.  ask neuropsych to provide  Counseling, pt plans to attend NA as OP   Tobacco abuse added nicoderm patch [id number].  CKD stage III.    Creatinine 1.95 on 3/22,up to 2.44 on 3/28, 2.43 on 3/31, improved to 1.86 on 4/4 Stop IVF and enc po- recheck in am   4/12- Cr -at baseline  13. 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 Scheduled at 11pm as per patient's request. Will not increase during day due to grogginess this morning.  15.  Slow transit constipation    16.  Insomnia- will trial restoril- improved  17.  Left shoulder pain preceded CVA shoulder xray neg , suspect impingement syndrome , rec cortisone injection left subacromial  LOS: 23 days A FACE TO FACE EVALUATION WAS PERFORMED  Charlett Blake 05/16/2020, 8:30 AM

## 2020-05-16 NOTE — Progress Notes (Signed)
Speech Language Pathology Daily Session Note  Patient Details  Name: Bobby Branch MRN: [id number] Date of Birth: 09-Jun-1960  Today's Date: 05/16/2020 SLP Individual Time: 0930-1000 SLP Individual Time Calculation (min): 30 min  Short Term Goals: Week 3: SLP Short Term Goal 1 (Week 3): Pt will demonstate ability to use compensatory memory strategies for semi-complex tasks/recall with Supervision A verbal/visual cues. SLP Short Term Goal 2 (Week 3): Pt will anticipate 2 ADLs he will need assistance with at d/c and/or 2 ways to prevent falls at home with Min A cues. SLP Short Term Goal 3 (Week 3): Pt will selectively attend to tasks with Supervision cues for redirection. SLP Short Term Goal 4 (Week 3): Pt will demonstrate ability to problem solve mildly complex to complex situations with Supervision. SLP Short Term Goal 5 (Week 3): Pt will detect fuctional errors with Min A cues.  Skilled Therapeutic Interventions: Skilled SLP intervention focused on cognition. Medications written on paper for patient to review. He was able to locate purpose and time taken with Supervision A. Pt educated on importance of pill organizer for medications once home. Pt completed time calculations with daily schedule with min A verbal cues for redirection to task. Pt often distracted by his own thoughts during session. Cont with therapy per plan of care.      Pain Pain Assessment Pain Scale: Faces Pain Score: 3  Faces Pain Scale: No hurt  Therapy/Group: Individual Therapy  Darrol Poke Horace Lukas 05/16/2020, 10:55 AM

## 2020-05-16 NOTE — Patient Care Conference (Signed)
Inpatient RehabilitationTeam Conference and Plan of Care Update Date: 05/16/2020   Time: 10:46 AM    Patient Name: Bobby Branch      Medical Record Number: [id number]  Date of Birth: [birth year] Sex: Male         Room/Bed: 4M05C/4M05C-01 Payor Info: Payor: MEDICAID PENDING / Plan: MEDICAID PENDING / Product Type: *No Product type* /    Admit Date/Time:  04/23/2020  6:20 PM  Primary Diagnosis:  Right middle cerebral artery stroke [facility])  Hospital Problems: Principal Problem:   Right middle cerebral artery stroke ([place]) Active Problems:   Protein-calorie malnutrition, severe   Slow transit constipation   Spastic hemiplegia affecting nondominant side ([place])   Stage 3b chronic kidney disease ([place])   Essential hypertension   Controlled type 2 diabetes mellitus with hyperglycemia, without long-term current use of insulin [facility])    Expected Discharge Date: Expected Discharge Date: 05/22/20  Team Members Present: Physician leading conference: Dr. Alysia Penna Care Coodinator Present: Dorien Chihuahua, RN, BSN, CRRN;Christina [place], BSW Nurse Present: Dorien Chihuahua, RN PT Present: Page Spiro, PT OT Present: Clyda Greener, OT SLP Present: Junius Argyle, SLP PPS Coordinator present : Gunnar Fusi, SLP     Current Status/Progress Goal Weekly Team Focus  Bowel/Bladder   Patient is continent of bowel and bladder. Patient continues to use the stedy for transfers to and from the bathroom.  Last BM: 05/15/2020  Patient will be toileted q2h and prn to maintain normal bowel pattern  Toilet patient q2h and prn   Swallow/Nutrition/ Hydration             ADL's   Min assist for UB bathing and dressing.  Mod assist for LB bathing and dressing overall.  Min to mod for squat pivot transfers with mod to max stand pivot or short distance mobility.  Still with LUE at [place] stage II in the left arm and hand.  Increased flexor tone noted in the digits, elbow, and internal rotators of the  shoulder.  supervision to min assist overall  selfcare retraining, DME education, neuromuscular re-educaiton, NMES, balance retraining,  pt education   Mobility   supine<>sit with min assist, sit<>stands with min assist, bed<>chair transfers min assist, gait up to 68f with max A of 1 for balance and max facilitation for L LE gait mechanics (demos inconsistent quad activation during stance and is able to initiate L LE advancement during swing), progress continues to be limited due to slow L LE recovery, L inattention, and poor attention to tasks  CGA/min assist overall - downgraded ambulation goals based on pt progress  pt education, activity tolerance, bed mobility, transfer training, gait training, L LE NMR, dynamic standing balance, wheelchair mobility   Communication             Safety/Cognition/ Behavioral Observations  Supervison to min A for memory and semi complex problem solving, Mod A for safety awareness.  Supervision  reasoning with discharge plan, safety following dc home, selective attention   Pain   Patient voices that his pain is 4/10 to the left shoulder. Patient states prn medication is effective.  Patient states his pain level goal is 2.  Team will assess pain qshift and prn. PRN analgesics will be adminisered per order.   Skin   Patient's skin is currently clean, dry, and intact.  Patient's skin will remain intact.  Team will assess patient's skin qshift and prn.     Discharge Planning:  pt d/c home with girlfriend and family to provide  care   Team Discussion: Progress limited by increased tone in Left UE and Left shoulder impingement pain = corticosteroid injection per MD. Rich Brave off IVF, encourage po fluids. Patient is easily distracted and has trouble attending to tasks, poor safety awareness and poor motor recruitment strategies.  Patient on target to meet rehab goals: yes, currently min assist for upper body care and mod assist for lower body care mod - max assist for  squat pivot transfers and min assist for memory. Min assist goals set for discharge.  *See Care Plan and progress notes for long and short-term goals.   Revisions to Treatment Plan:  E-stim Neuromuscular re-education  Downgraded ambulation goals  Teaching Needs: Transfers, toileting, medication management, secondary stroke risk management, etc.   Current Barriers to Discharge: Decreased caregiver support and Home enviroment access/layout  Possible Resolutions to Barriers: Family and friend education DME     Medical Summary Current Status: Left shoulder pain, increasing tone poor fluid intake , BPs stabilizing  Barriers to Discharge: Medical stability;Other (comments)  Barriers to Discharge Comments: tone, prior drug use Possible Resolutions to Barriers/Weekly Focus: sorticosteroid injection for shoulder pain   Continued Need for Acute Rehabilitation Level of Care: The patient requires daily medical management by a physician with specialized training in physical medicine and rehabilitation for the following reasons: Direction of a multidisciplinary physical rehabilitation program to maximize functional independence : Yes Medical management of patient stability for increased activity during participation in an intensive rehabilitation regime.: Yes Analysis of laboratory values and/or radiology reports with any subsequent need for medication adjustment and/or medical intervention. : Yes   I attest that I was present, lead the team conference, and concur with the assessment and plan of the team.   Dorien Chihuahua B 05/16/2020, 1:46 PM

## 2020-05-17 LAB — GLUCOSE, CAPILLARY
Glucose-Capillary: 157 mg/dL — ABNORMAL HIGH (ref 70–99)
Glucose-Capillary: 160 mg/dL — ABNORMAL HIGH (ref 70–99)
Glucose-Capillary: 75 mg/dL (ref 70–99)
Glucose-Capillary: 209 mg/dL — ABNORMAL HIGH (ref 70–99)

## 2020-05-17 LAB — BASIC METABOLIC PANEL
Anion gap: 7 (ref 5–15)
BUN: 33 mg/dL — ABNORMAL HIGH (ref 6–20)
CO2: 24 mmol/L (ref 22–32)
Calcium: 9.4 mg/dL (ref 8.9–10.3)
Chloride: 104 mmol/L (ref 98–111)
Creatinine, Ser: 2.13 mg/dL — ABNORMAL HIGH (ref 0.61–1.24)
GFR, Estimated: 35 mL/min — ABNORMAL LOW (ref >60)
Glucose, Bld: 153 mg/dL — ABNORMAL HIGH (ref 70–99)
Potassium: 4.6 mmol/L (ref 3.5–5.1)
Sodium: 135 mmol/L (ref 135–145)

## 2020-05-17 MED ORDER — GLIMEPIRIDE 1 MG PO TABS
2.0000 mg | ORAL_TABLET | Freq: Every day | ORAL | Status: DC
  Administered 2020-05-17 – 2020-05-20 (×4): 2 mg via ORAL
  Filled 2020-05-17 (×4): qty 2

## 2020-05-17 NOTE — Progress Notes (Signed)
Physical Therapy Session Note  Patient Details  Name: Bobby Branch MRN: [id number] Date of Birth: 02-03-1961  Today's Date: 05/17/2020 PT Individual Time: 1007-1107 PT Individual Time Calculation (min): 60 min   Short Term Goals: Week 4:  PT Short Term Goal 1 (Week 4): =LTG  Skilled Therapeutic Interventions/Progress Updates:    Pt received sitting in WC. Reports L shoulder pain has decreased since cortisone injection given yesterday. Pt is agreeable to therapy; wheeled to therapy gym for time management. Pt did report not having slept well last night and portrayed fatigue and distractibility throughout today's session.  Shoes donned for the first time during pt stay; Ted hose and shoes donned max assist. Transferred L towards mat squat pivot min assist with cuing for head hips relationship and therapist facilitation of WB through LLE. STS min assist with L knee blocking for buckle.  Pre-gait NMR with mirror feedback via R stepping onto and off of 4" step for increased L stance control. VC for L glute/quad activation and "shift weight onto L hip when stepping." Anterior and posterior L knee blocking required for buckling and hyperext; therapist glute facilitation still required, but able to feel L quad contraction during stepping. Pt fatigued throughout session, but able to perform this activity with greater success and increased reps in comparison to session yesterday.  Follow-up gait training performed max assist for balance and to advance LLE without UE support for ~28f. Performed with shoes and a toe cap on the LLE for assistance with advancement. Pt is able to initiate L swing, but continues to require significant therapist facilitation to complete LLE advancement. L knee blocking and glute facilitation provided; pt able to perform weight shifting over LLE during L stance to advance RLE with similar success as yesterday, although according to pt report and therapist observation, fatigue  limited pt strength during activity. Moreover, decreased success may have occurred as gait was performed with shoes for the first time; pt reported "it was harder with the shoes."  Pt wheeled to ortho gym for time management. Car transfer performed min assist squat pivot. Pt verbalized that he has "very low car." [facility] during transfer and demonstrated understanding at this time, but needs additional reinforcement. Max assist required to lift LLE into and out of vehicle. Plan family education for car transfer with pt's significant other.  Pt wheeled to room for time management. Transferred to sitting EOB squat pivot min assist with cuing for head hips relationship and therapist facilitation of WB through LLE. Transferred to supine min assist. Pt left in bed with LUE supported, needs within reach, and bed alarm turned on.  Therapy Documentation Precautions:  Precautions Precautions: Fall,Other (comment) Precaution Comments: L hemiplegia, L inattention Restrictions Weight Bearing Restrictions: No   Therapy/Group: Individual Therapy  KEleonore Chiquito SPT 05/17/2020, 4:03 PM

## 2020-05-17 NOTE — Progress Notes (Signed)
Patient ID: Bobby Branch, male   DOB: [birth year], 60 y.o.   MRN: [id number]   Pt set up with hospital follow up on June 1st at 8:30 AM with Freeman Caldron, [place] at [facility].  [place], [place]

## 2020-05-17 NOTE — Progress Notes (Signed)
Occupational Therapy Session Note  Patient Details  Name: Bobby Branch MRN: [id number] Date of Birth: Jun 19, 1960  Today's Date: 05/17/2020 OT Individual Time: [id number] OT Individual Time Calculation (min): 35 min    Short Term Goals: Week 3:  OT Short Term Goal 1 (Week 3): Pt will maintain sustained attention to selfcare tasks for 15 mins with no more than min instructional cueing for re-direction. OT Short Term Goal 2 (Week 3): Pt will integrate the LUE into bathing tasks as a stabilizer with mod assist and mod instructional cueing. OT Short Term Goal 3 (Week 3): Pt will complete bathing sit to stand with min assist. OT Short Term Goal 4 (Week 3): Pt will donn pullover shirt with supervision following hemi technique.  Skilled Therapeutic Interventions/Progress Updates:    Session 1: [id number])  Pt completed supine to sit EOB with min assist for session.  While sitting on the EOB NMES was applied to the left triceps and digit extensors.  Intensity was set on level 30 for each with on time at 10 seconds and off time at 5 seconds.  He worked on activation of functional reach along with stimulation and max assist to target.  Mod instructional cueing cueing needed to maintain sustained attention on task secondary to conversation.  He was able to tolerate 15 min of stimulation without any adverse reactions.  Finished session with pt completing squat pivot transfer to the wheelchair from bed with min assist.  He was left with the call button and phone in reach and safety belt in place.    Session 2: [id number]) Pt in bed to start session, agreeable to completion of shower, toileting, and dressing.  He was able to complete transfer to the EOB with min assist to start session.  He then completed functional mobility to the shower with max assist and no device.  Max assist needed for advancement of the LLE and for stabilization of the left knee with weightbearing.  He completed bathing sit to stand with  overall min assist.  Mod assist needed for managing the LLE over the right knee to wash and dry the foot.  Max assist also needed for hand over hand assist to wash the RUE using the hemi LUE.  He was able to complete bathing and then transfer stand pivot to the 3:1 with mod assist.  Min assist to complete toilet hygiene with mod assist transfer back to the wheelchair for completion of dressing.  He was able to donn his brief and pants following hemi techniques with mod assist.  Mod assist was also needed for donning a pullover shirt.  Finished session with max assist for gripper socks and for donning the left resting hand splint.  Call button and phone in reach with safety belt and half lap tray in place.   Therapy Documentation Precautions:  Precautions Precautions: Fall,Other (comment) Precaution Comments: L hemiplegia, L inattention Restrictions Weight Bearing Restrictions: No   Pain: Pain Assessment Pain Scale: Faces Faces Pain Scale: Hurts a little bit Pain Type: Neuropathic pain Pain Orientation: Left Pain Descriptors / Indicators: Discomfort Pain Onset: On-going Pain Intervention(s): Repositioned ADL: See Care Tool Section for some details of mobility and selfcare  Therapy/Group: Individual Therapy  Joni Norrod OTR/L 05/17/2020, 12:21 PM

## 2020-05-17 NOTE — Progress Notes (Signed)
Speech Language Pathology Daily Session Note  Patient Details  Name: Bobby Branch MRN: [id number] Date of Birth: 08/03/1960  Today's Date: 05/17/2020 SLP Individual Time: 0217-0247 SLP Individual Time Calculation (min): 30 min  Short Term Goals: Week 3: SLP Short Term Goal 1 (Week 3): Pt will demonstate ability to use compensatory memory strategies for semi-complex tasks/recall with Supervision A verbal/visual cues. SLP Short Term Goal 2 (Week 3): Pt will anticipate 2 ADLs he will need assistance with at d/c and/or 2 ways to prevent falls at home with Min A cues. SLP Short Term Goal 3 (Week 3): Pt will selectively attend to tasks with Supervision cues for redirection. SLP Short Term Goal 4 (Week 3): Pt will demonstrate ability to problem solve mildly complex to complex situations with Supervision. SLP Short Term Goal 5 (Week 3): Pt will detect fuctional errors with Min A cues.  Skilled Therapeutic Interventions: Skilled SLP intervention focused on cognition. Verbal reasoning regarding left inattention and safety with transfers completed. Pt able to recall steps for squat pivot transfer and positioning for arms and foot with mod A verbal cues. Pt able to identify errors in SLPs sequencing when demonstrating squat pivot transfer when given choice of 2 with min A visual cues. Cont with therapy per plan of care. Pt reports girlfriend is coming tomorrow for education.      Pain Pain Assessment Pain Scale: Faces Faces Pain Scale: No hurt  Therapy/Group: Individual Therapy  Darrol Poke Bobby Branch 05/17/2020, 2:42 PM

## 2020-05-17 NOTE — Progress Notes (Signed)
PROGRESS NOTE   Subjective/Complaints:  SOme shoulder pain when he lays on it at night.  Pt states that shoulder feels a little better  ROS:    Pt denies SOB, abd pain, CP, N/V/C/D, and vision changes   Objective:   DG Shoulder 1V Left  Result Date: 05/15/2020 CLINICAL DATA:  Left shoulder pain, recent stroke 3 weeks ago EXAM: LEFT SHOULDER COMPARISON:  None. FINDINGS: Limited single view exam. No gross malalignment. Negative for fracture. AC joint aligned. Included left chest unremarkable. IMPRESSION: No definite acute finding by plain radiography Electronically Signed   By: Jerilynn Mages.  Shick M.D.   On: 05/15/2020 08:49   No results for input(s): WBC, HGB, HCT, PLT in the last 72 hours. Recent Labs    05/17/20 0512  NA 135  K 4.6  CL 104  CO2 24  GLUCOSE 153*  BUN 33*  CREATININE 2.13*  CALCIUM 9.4    Intake/Output Summary (Last 24 hours) at 05/17/2020 0729 Last data filed at 05/17/2020 0538 Gross per 24 hour  Intake 640 ml  Output 1500 ml  Net -860 ml        Physical Exam: Vital Signs Blood pressure 135/85, pulse 94, temperature 98.6 F (37 C), temperature source Oral, resp. rate 20, height '5\' 6"'$  (1.676 m), weight 58.5 kg, SpO2 93 %.   General: No acute distress Mood and affect are appropriate Heart: Regular rate and rhythm no rubs murmurs or extra sounds Lungs: Clear to auscultation, breathing unlabored, no rales or wheezes Abdomen: Positive bowel sounds, soft nontender to palpation, nondistended Extremities: No clubbing, cyanosis, or edema Skin: No evidence of breakdown, no evidence of rash  Motor:  RUE/RLE: 5/5 proximal distal  LUE/LLE: 0/5 proximal distal Increased tone noted in left upper extremity MAS 1+ to 2 on L elbow and L wrist.  , flexor withdrawal LLE   Assessment/Plan: 1. Functional deficits which require 3+ hours per day of interdisciplinary therapy in a comprehensive inpatient rehab  setting.  Physiatrist is providing close team supervision and 24 hour management of active medical problems listed below.  Physiatrist and rehab team continue to assess barriers to discharge/monitor patient progress toward functional and medical goals  Care Tool:  Bathing    Body parts bathed by patient: Left arm,Chest,Abdomen,Front perineal area,Right lower leg,Right upper leg,Left upper leg,Face,Buttocks   Body parts bathed by helper: Right arm,Left lower leg     Bathing assist Assist Level: Moderate Assistance - Patient 50 - 74%     Upper Body Dressing/Undressing Upper body dressing   What is the patient wearing?: Pull over shirt    Upper body assist Assist Level: Moderate Assistance - Patient 50 - 74%    Lower Body Dressing/Undressing Lower body dressing      What is the patient wearing?: Pants,Incontinence brief     Lower body assist Assist for lower body dressing: Moderate Assistance - Patient 50 - 74%     Toileting Toileting    Toileting assist Assist for toileting: Moderate Assistance - Patient 50 - 74%     Transfers Chair/bed transfer  Transfers assist     Chair/bed transfer assist level: Moderate Assistance - Patient 50 - 74%  Locomotion Ambulation   Ambulation assist   Ambulation activity did not occur: Safety/medical concerns  Assist level: Maximal Assistance - Patient 25 - 49% Assistive device: No Device Max distance: 89f   Walk 10 feet activity   Assist  Walk 10 feet activity did not occur: Safety/medical concerns  Assist level: Maximal Assistance - Patient 25 - 49% Assistive device: No Device   Walk 50 feet activity   Assist Walk 50 feet with 2 turns activity did not occur: Safety/medical concerns  Assist level: Maximal Assistance - Patient 25 - 49% Assistive device: No Device    Walk 150 feet activity   Assist Walk 150 feet activity did not occur: Safety/medical concerns         Walk 10 feet on uneven surface   activity   Assist Walk 10 feet on uneven surfaces activity did not occur: Safety/medical concerns         Wheelchair     Assist Will patient use wheelchair at discharge?: Yes Type of Wheelchair: Manual    Wheelchair assist level: Supervision/Verbal cueing Max wheelchair distance: 351f   Wheelchair 50 feet with 2 turns activity    Assist    Wheelchair 50 feet with 2 turns activity did not occur: Safety/medical concerns   Assist Level: Minimal Assistance - Patient > 75%   Wheelchair 150 feet activity     Assist  Wheelchair 150 feet activity did not occur: Safety/medical concerns       Blood pressure 135/85, pulse 94, temperature 98.6 F (37 C), temperature source Oral, resp. rate 20, height '5\' 6"'$  (1.676 m), weight 58.5 kg, SpO2 93 %. Medical Problem List and Plan: 1.  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  Impaired mobility: -DVT/anticoagulation: Conitnue SCDs             -antiplatelet therapy: Continue Aspirin 325 mg daily and Plavix 75 mg daily x3 months 3. Left shoulder pain: Tylenol as needed. Add voltaren gel and lidocaine patch.   4/2-patient reports pain is controlled-continue regimen 4. Depression: Continue Lexapro 10 mg daily             -antipsychotic agents: N/A 5. Neuropsych: This patient is capable of making decisions on his own behalf. 6. Skin/Wound Care: Routine skin checks 7. Fluids/Electrolytes/Nutrition: Routine in and outs poor intake po fluid , only 177 po in on 4/9  8.  Hypertension.  BP has been labile- continue Norvasc 10 mg daily.  Monitor with increased mobility   4/11- con't BP meds- BP controlled overall.  Vitals:   05/16/20 1948 05/17/20 0344  BP: (!) 143/89 135/85  Pulse: 93 94  Resp: 20 20   Temp: 98.2 F (36.8 C) 98.6 F (37 C)  SpO2: 99% 93%  increase apresoline , BP improving  9.  Diabetes mellitus with hyperglycemia.  Hemoglobin A1c 7.3.  SSI.  Patient on Glucophage 500 mg twice daily prior to admission.  Resume as needed CBG (last 3)  Recent Labs    05/16/20 1711 05/16/20 2106 05/17/20 0600  GLUCAP 218* 312* 157*     Elevated in pm   4/3- start Tradjenta 5 mg daily and monitor , with elevated pm CBGs , trial amaryl 10.  Hyperlipidemia: Lipitor 11.  Polysubstance abuse.  Urine drug screen positive cocaine as well as  marijuana.  ask neuropsych to provide  Counseling, pt plans to attend NA as OP   Tobacco abuse added nicoderm patch 22mg 12.  CKD stage III.    Creatinine 1.95 on 3/22,up to 2.44 on 3/28, 2.43 on 3/31, improved to 1.86 on 4/4 Stop IVF and enc po- recheck in am   4/12- Cr -at baseline  13. 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 Scheduled at 11pm as per patient's request. Will not increase during day due to grogginess this morning.  15.  Slow transit constipation    16.  Insomnia- will trial restoril- improved  17.  Left shoulder pain aubacromial impingement, 4/13 cortisone injection left subacromial  LOS: 24 days A FACE TO FACE EVALUATION WAS PERFORMED  ACharlett Blake4/14/2022, 7:29 AM

## 2020-05-18 LAB — GLUCOSE, CAPILLARY
Glucose-Capillary: 121 mg/dL — ABNORMAL HIGH (ref 70–99)
Glucose-Capillary: 149 mg/dL — ABNORMAL HIGH (ref 70–99)
Glucose-Capillary: 106 mg/dL — ABNORMAL HIGH (ref 70–99)
Glucose-Capillary: 104 mg/dL — ABNORMAL HIGH (ref 70–99)

## 2020-05-18 NOTE — Procedures (Signed)
Xeomin Injection for spasticity using needle EMG guidance Lot [id number], exp 11/2021 Dilution: 50 Units/ml Indication: Severe spasticity which interferes with ADL,mobility and/or  hygiene and is unresponsive to medication management and other conservative care Informed consent was obtained after describing risks and benefits of the procedure with the patient. This includes bleeding, bruising, infection, excessive weakness, or medication side effects. A REMS form is on file and signed. Needle: 25g 1.5 in needle electrode Number of units per muscle Pectoralis100 Biceps50 Brachiorad 50 Hamstrings100 medial  All injections were done after obtaining appropriate EMG activity and after negative drawback for blood. The patient tolerated the procedure well. Post procedure instructions were given. A followup appointment was made.

## 2020-05-18 NOTE — Progress Notes (Signed)
Occupational Therapy Weekly Progress Note  Patient Details  Name: Bobby Branch MRN: [id number] Date of Birth: 1960/12/09  Beginning of progress report period: May 11, 2020 End of progress report period: May 18, 2020  Today's Date: 05/18/2020 OT Individual Time: 1302-1406 OT Individual Time Calculation (min): 64 min    Patient has met 2 of 4 short term goals.  Bobby Branch continues to make steady progress with OT at this time overall.  He is able to complete bathing tasks at shower level with min assist sit to stand.  UB dressing is also at min assist following hemi dressing techniques.  He is able to complete LB dressing at mod assist.  Squat pivot transfers to the drop arm commode, or wheelchair are min assist while stand pivot transfers are overall mod assist.  Functional mobility to the shower requires max assist secondary to severe LLE paresis.  He continues to demonstrate decreased sustained attention to tasks secondary to internal and external distractions.  Mod instructional cueing is needed to attend to selfcare tasks.  Bobby Branch continues to exhibit significant LUE tone in the biceps, digit flexors, and internal rotators.  He needs max hand over hand for integration into selfcare tasks and is currently tolerating NMES to the elbow extensors as well as the digit extensors.  He still reports increased pain in the left shoulder with PROM and received an injection earlier this week.  Plan for expected discharge on 4/19 with his significant other providing 24 hour supervision.  She is planning to come in for education in the next day or so.  Recommend continued CIR level therapy until then  Patient continues to demonstrate the following deficits: muscle weakness and muscle paralysis, impaired timing and sequencing, abnormal tone, unbalanced muscle activation and decreased coordination, decreased attention to left, decreased awareness, decreased safety awareness and decreased memory and decreased  sitting balance, decreased standing balance, decreased postural control, hemiplegia and decreased balance strategies and therefore will continue to benefit from skilled OT intervention to enhance overall performance with BADL and Reduce care partner burden.  Patient not progressing toward some LTGs, see Care Plan for changes..  Continue plan of care.  OT Short Term Goals Week 4:  OT Short Term Goal 1 (Week 4): Continue working on established LTGs set at min assist overall.  Skilled Therapeutic Interventions/Progress Updates:    Pt completed supine to sit EOB with min assist to start session.  He then completed squat pivot transfer to the wheelchair and mod assist stand pivot transfer to the toilet.  Min assist for clothing management X 2 during session as he completed another transfer at the end of session as well.  Next, he was able to complete transfer to the therapy mat with min assist stand pivot.  Had pt transition to supine and into left sidelying with min assist for work on scapular mobilizations and attempted AROM of scapular depression.  Pt with only active trace depression.  Transitioned to supine with rolling to the left side to incorporate weightbearing over the left scapula with the left arm abducted to about 60 degrees.  He then transitioned to sitting with mod assist.  Hand paddle placed on the LUE to assist with finger extension during weightbearing tasks.  Worked on lateral weightshifts to the right with emphasis on attempted activation of the left elbow extensors and shortening of left trunk.  Mod assist needed to complete reciprical scooting on the left side while keeping the LUE in weightbearing.  Gave pt target  to reach for with the RUE as well to help promote weightshift.  Pt still with increased tone in the chest and biceps, but not as severe today.  Finished session with return to the wheelchair with mod assist stand pivot and transfer to the toilet at the previous mod assist level.   He was left with the NT in the room to finish.    Therapy Documentation Precautions:  Precautions Precautions: Fall,Other (comment) Precaution Comments: L hemiplegia, L inattention Restrictions Weight Bearing Restrictions: No Pain: Pain Assessment Pain Scale: Faces Faces Pain Scale: Hurts a little bit Pain Type: Neuropathic pain Pain Location: Shoulder Pain Orientation: Left Pain Descriptors / Indicators: Discomfort Pain Onset: With Activity Pain Intervention(s): Repositioned ADL: See Care Tool Section for some details of mobility and selfcare  Therapy/Group: Individual Therapy  MCGUIRE,JAMES OTR/L 05/18/2020, 4:03 PM  

## 2020-05-18 NOTE — Progress Notes (Signed)
PROGRESS NOTE   Subjective/Complaints: No shoulder pain today , slept ok Discussed Botulinum toxin injection  ROS:    Pt denies SOB, abd pain, CP, N/V/C/D, and vision changes   Objective:   No results found. No results for input(s): WBC, HGB, HCT, PLT in the last 72 hours. Recent Labs    05/17/20 0512  NA 135  K 4.6  CL 104  CO2 24  GLUCOSE 153*  BUN 33*  CREATININE 2.13*  CALCIUM 9.4    Intake/Output Summary (Last 24 hours) at 05/18/2020 0825 Last data filed at 05/18/2020 0700 Gross per 24 hour  Intake 680 ml  Output 1425 ml  Net -745 ml        Physical Exam: Vital Signs Blood pressure (!) 152/91, pulse 86, temperature 98.6 F (37 C), temperature source Oral, resp. rate 20, height '5\' 6"'$  (1.676 m), weight 58.5 kg, SpO2 100 %.   General: No acute distress Mood and affect are appropriate Heart: Regular rate and rhythm no rubs murmurs or extra sounds Lungs: Clear to auscultation, breathing unlabored, no rales or wheezes Abdomen: Positive bowel sounds, soft nontender to palpation, nondistended Extremities: No clubbing, cyanosis, or edema Skin: No evidence of breakdown, no evidence of rash  Motor:  RUE/RLE: 5/5 proximal distal  LUE/LLE: 0/5 proximal distal Increased tone noted in left upper extremity MAS 1+ to 2 on L elbow and L wrist.  , flexor withdrawal LLE   Assessment/Plan: 1. Functional deficits which require 3+ hours per day of interdisciplinary therapy in a comprehensive inpatient rehab setting.  Physiatrist is providing close team supervision and 24 hour management of active medical problems listed below.  Physiatrist and rehab team continue to assess barriers to discharge/monitor patient progress toward functional and medical goals  Care Tool:  Bathing    Body parts bathed by patient: Left arm,Chest,Abdomen,Front perineal area,Right lower leg,Right upper leg,Left upper leg,Face,Buttocks    Body parts bathed by helper: Right arm,Left lower leg     Bathing assist Assist Level: Minimal Assistance - Patient > 75%     Upper Body Dressing/Undressing Upper body dressing   What is the patient wearing?: Pull over shirt    Upper body assist Assist Level: Minimal Assistance - Patient > 75%    Lower Body Dressing/Undressing Lower body dressing      What is the patient wearing?: Pants,Incontinence brief     Lower body assist Assist for lower body dressing: Moderate Assistance - Patient 50 - 74%     Toileting Toileting    Toileting assist Assist for toileting: Minimal Assistance - Patient > 75%     Transfers Chair/bed transfer  Transfers assist     Chair/bed transfer assist level: Minimal Assistance - Patient > 75% (squat pivot)     Locomotion Ambulation   Ambulation assist   Ambulation activity did not occur: Safety/medical concerns  Assist level: Maximal Assistance - Patient 25 - 49% Assistive device: No Device Max distance: 10'   Walk 10 feet activity   Assist  Walk 10 feet activity did not occur: Safety/medical concerns  Assist level: Maximal Assistance - Patient 25 - 49% Assistive device: No Device   Walk 50 feet activity  Assist Walk 50 feet with 2 turns activity did not occur: Safety/medical concerns  Assist level: Maximal Assistance - Patient 25 - 49% Assistive device: No Device    Walk 150 feet activity   Assist Walk 150 feet activity did not occur: Safety/medical concerns         Walk 10 feet on uneven surface  activity   Assist Walk 10 feet on uneven surfaces activity did not occur: Safety/medical concerns         Wheelchair     Assist Will patient use wheelchair at discharge?: Yes Type of Wheelchair: Manual    Wheelchair assist level: Supervision/Verbal cueing Max wheelchair distance: 55f    Wheelchair 50 feet with 2 turns activity    Assist    Wheelchair 50 feet with 2 turns activity did not  occur: Safety/medical concerns   Assist Level: Minimal Assistance - Patient > 75%   Wheelchair 150 feet activity     Assist  Wheelchair 150 feet activity did not occur: Safety/medical concerns       Blood pressure (!) 152/91, pulse 86, temperature 98.6 F (37 C), temperature source Oral, resp. rate 20, height '5\' 6"'$  (1.676 m), weight 58.5 kg, SpO2 100 %. Medical Problem List and Plan: 1.  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  Impaired mobility: -DVT/anticoagulation: Conitnue SCDs             -antiplatelet therapy: Continue Aspirin 325 mg daily and Plavix 75 mg daily x3 months 3. Left shoulder pain: Tylenol as needed. Add voltaren gel and lidocaine patch.   4/2-patient reports pain is controlled-continue regimen 4. Depression: Continue Lexapro 10 mg daily             -antipsychotic agents: N/A 5. Neuropsych: This patient is capable of making decisions on his own behalf. 6. Skin/Wound Care: Routine skin checks 7. Fluids/Electrolytes/Nutrition: Routine in and outs poor intake po fluid , only 177 po in on 4/9  8.  Hypertension.  BP has been labile- continue Norvasc 10 mg daily.  Monitor with increased mobility   4/11- con't BP meds- BP controlled overall.  Vitals:   05/17/20 2003 05/18/20 0523  BP: (!) 145/82 (!) 152/91  Pulse: 82 86  Resp: 15 20  Temp: 98.1 F (36.7 C) 98.6 F (37 C)  SpO2: 99% 100%  increase apresoline , BP improving  9.  Diabetes mellitus with hyperglycemia.  Hemoglobin A1c 7.3.  SSI.  Patient on Glucophage 500 mg twice daily prior to admission.  Resume as needed CBG (last 3)  Recent Labs    05/17/20 1646 05/17/20 2108 05/18/20 0610  GLUCAP 75 209* 121*     Elevated in pm   4/3- start Tradjenta 5 mg daily and monitor ,  with elevated pm CBGs , trial amaryl 10.  Hyperlipidemia: Lipitor 11.  Polysubstance abuse.  Urine drug screen positive cocaine as well as marijuana.  ask neuropsych to provide  Counseling, pt plans to attend NA as OP   Tobacco abuse added nicoderm patch 742m 12.  CKD stage III.    Creatinine 1.95 on 3/22,up to 2.44 on 3/28, 2.43 on 3/31, improved to 1.86 on 4/4 Stop IVF and enc po- recheck in am   4/12- Cr -at baseline , creeping up again IVF at night to resume 13.  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 Scheduled at 11pm as per patient's request. Will not increase during day due to grogginess this morning.  Xeomin injection to Left pec, brachiorad , biceps and hamstrings 15.  Slow transit constipation    16.  Insomnia- will trial restoril- improved  17.  Left shoulder pain aubacromial impingement, 4/13 cortisone injection left subacromial  LOS: 25 days A FACE TO FACE EVALUATION WAS PERFORMED  Charlett Blake 05/18/2020, 8:25 AM

## 2020-05-18 NOTE — Progress Notes (Signed)
Occupational Therapy Discharge Summary  Patient Details  Name: Bobby Branch MRN: [id number] Date of Birth: [birth year]   Patient has met 9 of 12 long term goals due to postural control, ability to compensate for deficits and functional use of  LEFT upper and LEFT lower extremity.  Patient to discharge at overall Mod Assist level.  Patient's care partner is independent to provide the necessary physical and cognitive assistance at discharge. Pts caregiver completed OT family education during PT session as caregiver did not show up for scheduled OT family education.     Pt still requires increased assistance for UB dressing, eating, and using his Lt upper limb functionally. Therefore these goals were unable to be met  Recommendation:  Patient will benefit from ongoing skilled OT services in home health setting to continue to advance functional skills in the area of BADL and Reduce care partner burden.  Equipment: tub bench and drop arm commode  Reasons for discharge: treatment goals met and discharge from hospital  Patient/family agrees with progress made and goals achieved: Yes  OT Discharge Precautions/Restrictions  Precautions Precautions: Fall;Other (comment) Precaution Comments: L hemiplegia, L inattention Restrictions Weight Bearing Restrictions: No   Pain Pain Assessment Pain Scale: Faces Faces Pain Scale: No hurt ADL ADL Eating: Set up Where Assessed-Eating: Wheelchair Grooming: Setup Where Assessed-Grooming: Wheelchair Upper Body Bathing: Minimal assistance Where Assessed-Upper Body Bathing: Shower,Chair Lower Body Bathing: Minimal assistance Where Assessed-Lower Body Bathing: Shower,Chair Upper Body Dressing: Minimal assistance Where Assessed-Upper Body Dressing: Chair Lower Body Dressing: Moderate assistance Where Assessed-Lower Body Dressing: Wheelchair Toileting: Minimal assistance Where Assessed-Toileting: Bedside Commode Toilet Transfer: Minimal  assistance Toilet Transfer Method: Squat pivot Toilet Transfer Equipment: Drop arm bedside commode Tub/Shower Transfer: Moderate assistance Tub/Shower Transfer Method: Squat pivot Tub/Shower Equipment: Facilities manager: Moderate assistance Social research officer, government Method: Radiographer, therapeutic: Gaffer Baseline Vision/History: Wears glasses Wears Glasses: Reading only Patient Visual Report: Blurring of vision;No change from baseline Perception  Perception: Impaired Inattention/Neglect: Does not attend to left side of body;Does not attend to left visual field Cognition Overall Cognitive Status: Impaired/Different from baseline Arousal/Alertness: Awake/alert Attention: Selective;Sustained Sustained Attention: Impaired Sustained Attention Impairment: Functional basic Selective Attention: Impaired Selective Attention Impairment: Verbal basic;Functional basic Divided Attention: Impaired Memory: Appears intact Safety/Judgment: Impaired Sensation Sensation Light Touch: Appears Intact (Lt UE) Coordination Gross Motor Movements are Fluid and Coordinated: No Fine Motor Movements are Fluid and Coordinated: No Coordination and Movement Description: Lt hemiplegia with flexor synergies Finger Nose Finger Test: Unable to complete on the Lt Motor  Motor Motor: Hemiplegia;Abnormal tone;Abnormal postural alignment and control Motor - Discharge Observations: Still with significant LUE and LLE hemiparesis Mobility  Bed Mobility Bed Mobility: Supine to Sit;Sit to Supine Supine to Sit: Minimal Assistance - Patient > 75% Sit to Supine: Minimal Assistance - Patient > 75% Transfers Sit to Stand: Minimal Assistance - Patient > 75% Stand to Sit: Minimal Assistance - Patient > 75%  Trunk/Postural Assessment  Cervical Assessment Cervical Assessment: Exceptions to [facility] (forward head) Thoracic Assessment Thoracic Assessment: Exceptions to  [facility] (thoracic rounding the left side trunk elongation and right side shortening at rest) Lumbar Assessment Lumbar Assessment: Exceptions to [facility] (posterior pelvic tilt)  Balance Balance Balance Assessed: Yes Static Sitting Balance Static Sitting - Balance Support: Feet supported Static Sitting - Level of Assistance: 5: Stand by assistance Dynamic Sitting Balance Dynamic Sitting - Balance Support: During functional activity Dynamic Sitting - Level of Assistance: 4: Min assist Static Standing  Balance Static Standing - Balance Support: During functional activity;Right upper extremity supported Static Standing - Level of Assistance: 4: Min assist Dynamic Standing Balance Dynamic Standing - Balance Support: During functional activity;No upper extremity supported Dynamic Standing - Level of Assistance: 2: Max assist Extremity/Trunk Assessment RUE Assessment RUE Assessment: Within Functional Limits General Strength Comments: Strength 4/5 throughout LUE Assessment LUE Assessment: Exceptions to [facility] Passive Range of Motion (PROM) Comments: Limted shoulder flexion to aproximately 80 degrees secondary to increased glenohumeral pain.  Increased tone in the biceps, digit flexors, and shoulder adductor General Strength Comments: Brunnstrum stage II in the arm and hand with increased tone.  Slight shoulder flexion noted but no active movement in the digits.  Increased tone as noted above. He needs max assist to integrate to session as a stabilizer. LUE Tone LUE Tone: Moderate;Modified Ashworth Body Part - Modified Ashworth Scale: Elbow;Fingers Elbow - Modified Ashworth Scale for Grading Hypertonia LUE: Considerable increase in muschle tone, passive movement difficult Fingers - Modified Ashworth Scale for Grading Hypertonia LUE: Considerable increase in muschle tone, passive movement difficult Modified Ashworth Scale for Grading Hypertonia LUE: Considerable increase in muschle tone, passive movement  difficult   Margarine Grosshans A Autry Prust OTR/L 05/21/2020, 12:44 PM

## 2020-05-18 NOTE — Progress Notes (Signed)
Physical Therapy Session Note  Patient Details  Name: Bobby Branch MRN: [id number] Date of Birth: 09/06/1960  Today's Date: 05/18/2020 PT Individual Time: 0900-1015 PT Individual Time Calculation (min): 75 min   Short Term Goals: Week 4:  PT Short Term Goal 1 (Week 4): =LTG  Skilled Therapeutic Interventions/Progress Updates:    Session 1: Patient received sitting up in bed, RN providing AM rx, agreeable to PT. He denies pain. Patient able to come sit edge of bed with HOB elevated and CGA. Donning shirt at bedside with MinA and verbal cues to use hemi technique. He was able to transfer to wc via squat pivot with MinA leading R. PT transporting patient in wc to therapy gym for time management and energy conservation. Standing NMR task with MinA to remain standing, completing PEG board picture. R posterior pelvic rotation noted requiring external assist to correct. L UE weightbearing on table, but clawing of digits noted. Patient able to volitionally extend L knee in standing, but would not recognize that L knee had begun to flex unless verbally cued. Blocked practice squat pivot and stand pivot transfers completed to elevated mat height to simulate home bed height. CGA for squat pivot, MinA for stand pivot. Patient completing hemi technique wc mobility x132f with supervision and min verbal cues to attend to L to avoid obstacles. Patient generating decent power using R UE/LE. NuStep completed using B LE only with improving activation noted in L LE to extend quad against level 1 resistance on NuStep. He continues to have difficulty maintaining L knee midline throughout movement, however. Patient returning to room in wc, transferring to bed per MD request for Botox. Doffing shirt and pants with MinA/ModA. Patient able to reposition in bed with CGA and verbal cues. MD at bedside for Botox treatment.    Session 2: Patient received in the bathroom, NT present, agreeable to PT. He denies pain. Stedy transfer  back to wc. Handhygiene at sink with MinA to engage L UE in task. PT transporting patient in wc to therapy gym for time management and energy conservation. CGA squat pivot to therapy mat. Dynamic sitting balance task reaching across midline to retrieve horseshoes. Able to reach outside BOS with no LOB noted and safe return to midline. Patient demonstrating trace activation in L LE in hip flexion, knee flexion and extension. Patient requesting to return to room to return to bathroom. Stedy used for time management. CGA to stand in stedy using bar. MinA clothing management in standing. Hand off to NT.   Therapy Documentation Precautions:  Precautions Precautions: Fall,Other (comment) Precaution Comments: L hemiplegia, L inattention Restrictions Weight Bearing Restrictions: No    Therapy/Group: Individual Therapy  JKaroline Caldwell PT, DPT, CBIS  05/18/2020, 7:34 AM

## 2020-05-18 NOTE — Plan of Care (Signed)
  Problem: RH Balance Goal: LTG Patient will maintain dynamic standing with ADLs (OT) Description: LTG:  Patient will maintain dynamic standing balance with assist during activities of daily living (OT)  Flowsheets (Taken 05/18/2020 1228) LTG: Pt will maintain dynamic standing balance during ADLs with: (goal downgraded based on slower progress than originally expected) Moderate Assistance - Patient 50 - 74% Note: goal downgraded based on slower progress than originally expected   Problem: RH Dressing Goal: LTG Patient will perform lower body dressing w/assist (OT) Description: LTG: Patient will perform lower body dressing with assist, with/without cues in positioning using equipment (OT) Flowsheets (Taken 05/18/2020 1228) LTG: Pt will perform lower body dressing with assistance level of: (goal downgraded based on slower progress than originally expected) Moderate Assistance - Patient 50 - 74% Note: goal downgraded based on slower progress than originally expected   Problem: RH Functional Use of Upper Extremity Goal: LTG Patient will use RT/LT upper extremity as a (OT) Description: LTG: Patient will use right/left upper extremity as a stabilizer/gross assist/diminished/nondominant/dominant level with assist, with/without cues during functional activity (OT) Flowsheets (Taken 05/18/2020 1228) LTG: Use of upper extremity in functional activities: LUE as a stabilizer LTG: Pt will use upper extremity in functional activity with assistance level of: (goal downgraded based on slower progress than originally expected) Moderate Assistance - Patient 50 - 74% Note: goal downgraded based on slower progress than originally expected   Problem: RH Tub/Shower Transfers Goal: LTG Patient will perform tub/shower transfers w/assist (OT) Description: LTG: Patient will perform tub/shower transfers with assist, with/without cues using equipment (OT) Flowsheets (Taken 05/18/2020 1228) LTG: Pt will perform tub/shower  stall transfers with assistance level of: (goal downgraded based on slower progress than originally expected) Moderate Assistance - Patient 50 - 74% LTG: Pt will perform tub/shower transfers from: Tub/shower combination Note: goal downgraded based on slower progress than originally expected

## 2020-05-19 LAB — GLUCOSE, CAPILLARY
Glucose-Capillary: 181 mg/dL — ABNORMAL HIGH (ref 70–99)
Glucose-Capillary: 89 mg/dL (ref 70–99)
Glucose-Capillary: 121 mg/dL — ABNORMAL HIGH (ref 70–99)
Glucose-Capillary: 235 mg/dL — ABNORMAL HIGH (ref 70–99)

## 2020-05-19 NOTE — Progress Notes (Signed)
PROGRESS NOTE   Subjective/Complaints: Sleeping ok Shoulder pain only when sleeping, mild.  Had BM this morning  ROS:  Pt denies SOB, abd pain, CP, N/V/C/D, and vision changes   Objective:   No results found. No results for input(s): WBC, HGB, HCT, PLT in the last 72 hours. Recent Labs    05/17/20 0512  NA 135  K 4.6  CL 104  CO2 24  GLUCOSE 153*  BUN 33*  CREATININE 2.13*  CALCIUM 9.4    Intake/Output Summary (Last 24 hours) at 05/19/2020 1158 Last data filed at 05/19/2020 0820 Gross per 24 hour  Intake 680 ml  Output 877 ml  Net -197 ml        Physical Exam: Vital Signs Blood pressure 133/77, pulse 95, temperature 98 F (36.7 C), temperature source Oral, resp. rate 18, height '5\' 6"'$  (1.676 m), weight 58.5 kg, SpO2 96 %. Gen: no distress, normal appearing HEENT: oral mucosa pink and moist, NCAT Cardio: Reg rate Chest: normal effort, normal rate of breathing Abd: soft, non-distended Ext: no edema Psych: pleasant, normal affect Skin: intact Motor:  RUE/RLE: 5/5 proximal distal  LUE/LLE: 0/5 proximal distal Increased tone noted in left upper extremity MAS 1+ to 2 on L elbow and L wrist.  , flexor withdrawal LLE   Assessment/Plan: 1. Functional deficits which require 3+ hours per day of interdisciplinary therapy in a comprehensive inpatient rehab setting.  Physiatrist is providing close team supervision and 24 hour management of active medical problems listed below.  Physiatrist and rehab team continue to assess barriers to discharge/monitor patient progress toward functional and medical goals  Care Tool:  Bathing    Body parts bathed by patient: Left arm,Chest,Abdomen,Front perineal area,Right lower leg,Right upper leg,Left upper leg,Face,Buttocks   Body parts bathed by helper: Right arm,Left lower leg     Bathing assist Assist Level: Minimal Assistance - Patient > 75%     Upper Body  Dressing/Undressing Upper body dressing   What is the patient wearing?: Pull over shirt    Upper body assist Assist Level: Minimal Assistance - Patient > 75%    Lower Body Dressing/Undressing Lower body dressing      What is the patient wearing?: Pants,Incontinence brief     Lower body assist Assist for lower body dressing: Moderate Assistance - Patient 50 - 74%     Toileting Toileting    Toileting assist Assist for toileting: Minimal Assistance - Patient > 75%     Transfers Chair/bed transfer  Transfers assist     Chair/bed transfer assist level: Moderate Assistance - Patient 50 - 74% (stand pivot)     Locomotion Ambulation   Ambulation assist   Ambulation activity did not occur: Safety/medical concerns  Assist level: Maximal Assistance - Patient 25 - 49% Assistive device: No Device Max distance: 10'   Walk 10 feet activity   Assist  Walk 10 feet activity did not occur: Safety/medical concerns  Assist level: Maximal Assistance - Patient 25 - 49% Assistive device: No Device   Walk 50 feet activity   Assist Walk 50 feet with 2 turns activity did not occur: Safety/medical concerns  Assist level: Maximal Assistance - Patient 25 -  49% Assistive device: No Device    Walk 150 feet activity   Assist Walk 150 feet activity did not occur: Safety/medical concerns         Walk 10 feet on uneven surface  activity   Assist Walk 10 feet on uneven surfaces activity did not occur: Safety/medical concerns         Wheelchair     Assist Will patient use wheelchair at discharge?: Yes Type of Wheelchair: Manual    Wheelchair assist level: Supervision/Verbal cueing Max wheelchair distance: 175    Wheelchair 50 feet with 2 turns activity    Assist    Wheelchair 50 feet with 2 turns activity did not occur: Safety/medical concerns   Assist Level: Supervision/Verbal cueing   Wheelchair 150 feet activity     Assist  Wheelchair 150  feet activity did not occur: Safety/medical concerns   Assist Level: Supervision/Verbal cueing   Blood pressure 133/77, pulse 95, temperature 98 F (36.7 C), temperature source Oral, resp. rate 18, height '5\' 6"'$  (1.676 m), weight 58.5 kg, SpO2 96 %. Medical Problem List and Plan: 1.  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  Impaired mobility: -DVT/anticoagulation: Conitnue SCDs             -antiplatelet therapy: Continue Aspirin 325 mg daily and Plavix 75 mg daily x3 months 3. Left shoulder pain: Tylenol as needed. Add voltaren gel and lidocaine patch.   4/16-patient reports pain is controlled-continue regimen 4. Depression: Continue Lexapro 10 mg daily             -antipsychotic agents: N/A 5. Neuropsych: This patient is capable of making decisions on his own behalf. 6. Skin/Wound Care: Routine skin checks 7. Fluids/Electrolytes/Nutrition: Routine in and outs poor intake po fluid , only 177 po in on 4/9  8.  Hypertension.  BP has been labile- continue Norvasc 10 mg daily.  Monitor with increased mobility   4/16- continue BP meds- BP controlled overall.  Vitals:   05/18/20 1928 05/19/20 0342  BP: (!) 157/81 133/77  Pulse: 90 95  Resp: 18 18  Temp: 98.5 F (36.9 C) 98 F (36.7 C)  SpO2: 99% 96%  increase apresoline , BP improving  9.  Diabetes mellitus with hyperglycemia.  Hemoglobin A1c 7.3.  SSI.  Patient on Glucophage 500 mg twice daily prior to admission.  Resume as needed CBG (last 3)  Recent Labs    05/18/20 2103 05/19/20 0612 05/19/20 1121  GLUCAP 104* 181* 89     Elevated in pm   4/3- start Tradjenta 5 mg daily and monitor , with elevated pm CBGs , trial amaryl 10.  Hyperlipidemia: Lipitor 11.  Polysubstance abuse.  Urine drug screen  positive cocaine as well as marijuana.  ask neuropsych to provide  Counseling, pt plans to attend NA as OP   Tobacco abuse added nicoderm patch 71mg 12.  CKD stage III.    Creatinine 1.95 on 3/22,up to 2.44 on 3/28, 2.43 on 3/31, improved to 1.86 on 4/4 Stop IVF and enc po- recheck in am   4/12- Cr -at baseline , creeping up again IVF at night to resume 13. 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 Scheduled at 11pm as per patient's request. Will not increase during  day due to grogginess this morning.  Xeomin injection to Left pec, brachiorad , biceps and hamstrings 15.  Slow transit constipation    16.  Insomnia- will trial restoril- improved 17.  Left shoulder pain subacromial impingement, 4/13 cortisone injection left subacromial   -improved LOS: 26 days A FACE TO FACE EVALUATION WAS PERFORMED  Martha Clan P Nupur Hohman 05/19/2020, 11:58 AM

## 2020-05-19 NOTE — Progress Notes (Signed)
Speech Language Pathology Daily Session Note  Patient Details  Name: Bobby Branch MRN: [id number] Date of Birth: 1960-05-14  Today's Date: 05/19/2020 SLP Individual Time: 1045-1130 SLP Individual Time Calculation (min): 45 min  Short Term Goals: Week 3: SLP Short Term Goal 1 (Week 3): Pt will demonstate ability to use compensatory memory strategies for semi-complex tasks/recall with Supervision A verbal/visual cues. SLP Short Term Goal 2 (Week 3): Pt will anticipate 2 ADLs he will need assistance with at d/c and/or 2 ways to prevent falls at home with Min A cues. SLP Short Term Goal 3 (Week 3): Pt will selectively attend to tasks with Supervision cues for redirection. SLP Short Term Goal 4 (Week 3): Pt will demonstrate ability to problem solve mildly complex to complex situations with Supervision. SLP Short Term Goal 5 (Week 3): Pt will detect fuctional errors with Min A cues.  Skilled Therapeutic Interventions: Skilled SLP intervention focused on cognition and caregiver education. Family education completed over the phone due to caregiver not showing up. Educated friend on safety strategies and assistance patient will need following discharge home with left inattention with functional tasks and medication management. Caregiver verbalized understanding. Cont with therapy per plan of care.      Pain Pain Assessment Pain Scale: Faces Pain Score: 0-No pain Faces Pain Scale: Hurts a little bit Patients Stated Pain Goal: 0  Therapy/Group: Individual Therapy  Bobby Branch 05/19/2020, 11:20 AM

## 2020-05-19 NOTE — Progress Notes (Signed)
Occupational Therapy Session Note  Patient Details  Name: Bobby Branch MRN: [id number] Date of Birth: 11/03/1960  Today's Date: 05/19/2020 OT Individual Time: 1000-1045 OT Individual Time Calculation (min): 45 min    Short Term Goals: Week 4:  OT Short Term Goal 1 (Week 4): Continue working on established LTGs set at min assist overall.  Skilled Therapeutic Interventions/Progress Updates:    Pt greeted seated on toilet in [place]. Girlfriend did not show up for family education as previously scheduled. Pt reports his "ex" girlfriend is extremely unreliable and she was not coming. Pt had successful BM on commode. Worked on pt completing his own toileting with hip hike and lateral leans. OT reviewed directing self-care with caregivers at home. OT had patient talk therapist through toilet and tub bench transfers and practiced them while pt directing care. Re-iterated importance of patient explaining what he needs as no family has shown up for education. Pt did well expressing needs with use of gait belt, positioning of wc, and clothing management with toileting. OT expressed the importance of needing 24 hour help upon dc as he is a very high of falling. Pt verbalized understanding. OT showed pt his schedule for Monday and encouraged him to try to get GF here for any of those sessions for education.  Pt verbalized understanding. Pt left semi-reclined in bed with bed alarm on, call bell in reach, and needs met.   Therapy Documentation Precautions:  Precautions Precautions: Fall,Other (comment) Precaution Comments: L hemiplegia, L inattention Restrictions Weight Bearing Restrictions: No Pain: Pain Assessment Pain Scale: 0-10 Pain Score: 0-No pain Patients Stated Pain Goal: 0   Therapy/Group: Individual Therapy  Valma Cava 05/19/2020, 10:14 AM

## 2020-05-19 NOTE — Progress Notes (Signed)
Physical Therapy Session Note  Patient Details  Name: Bobby Branch MRN: [id number] Date of Birth: 1960/12/27  Today's Date: 05/19/2020 PT Individual Time: 0910-1006 PT Individual Time Calculation (min): 56 min   Short Term Goals: Week 4:  PT Short Term Goal 1 (Week 4): =LTG  Skilled Therapeutic Interventions/Progress Updates:    Pt received supine in bed. Did not report any pain and agreeable to therapy. Pt's significant other not present for family ed; pt attempted to call her but no response. Pt transferred to sitting EOB CGA with HOB lowered and without bed rail. Pt demonstrated slight difficulty without assistance, but able to perform successfully.  STS min assist for knee block due to L knee buckle. Donned pants max assist for time management, but pt verbalized being able to perform without assistance. Pt transferred to [facility] towards R squat pivot CGA. Re-instructed on WC parts management. Pt demonstrated difficulty putting L foot plate in place, requiring VC and manual facilitation to do so. Ted hose donned max assist. R shoe donned without assist, but L shoe required mod assist for placement and donning. VC to push down on LLE above knee after shoe has been placed in position.  WC mobility to therapy gym with supervision using R hemi technique ~13f. Pt educated to not push with RUE when wheeling straight in order to prevent turning. Car transfer re-practiced with lowest height (pt verbalized having "very low car"); performed min assist squat pivot. [facility] transfer and demonstrated understanding at this time. Max assist required to lift LLE into and out of vehicle. WC mobility back to room with supervision using R hemi technique ~531f   Pt educated on importance of pectoral stretching (after receiving botulinum toxin injection yesterday in LUE) for tone management. Pt educated to not flex LUE above 90 degrees to avoid shoulder impingement.  Pt verbalized  needing to use the bathroom. Transferred STS min assist in [place] time management and transferred to toilet. Pt left on toilet with RN in room.   Therapy Documentation Precautions:  Precautions Precautions: Fall,Other (comment) Precaution Comments: L hemiplegia, L inattention Restrictions Weight Bearing Restrictions: No    Therapy/Group: Individual Therapy  KiEleonore ChiquitoSPT 05/19/2020, 1:37 PM

## 2020-05-20 LAB — GLUCOSE, CAPILLARY
Glucose-Capillary: 101 mg/dL — ABNORMAL HIGH (ref 70–99)
Glucose-Capillary: 119 mg/dL — ABNORMAL HIGH (ref 70–99)
Glucose-Capillary: 103 mg/dL — ABNORMAL HIGH (ref 70–99)
Glucose-Capillary: 219 mg/dL — ABNORMAL HIGH (ref 70–99)

## 2020-05-20 MED ORDER — HYDRALAZINE HCL 50 MG PO TABS
60.0000 mg | ORAL_TABLET | Freq: Three times a day (TID) | ORAL | Status: DC
  Administered 2020-05-20 – 2020-05-22 (×7): 60 mg via ORAL
  Filled 2020-05-20 (×7): qty 1

## 2020-05-20 MED ORDER — GLIMEPIRIDE 1 MG PO TABS
3.0000 mg | ORAL_TABLET | Freq: Every day | ORAL | Status: DC
  Administered 2020-05-21 – 2020-05-22 (×2): 3 mg via ORAL
  Filled 2020-05-20 (×2): qty 3

## 2020-05-20 MED ORDER — GLIMEPIRIDE 1 MG PO TABS: 4.0000 mg | ORAL_TABLET | Freq: Every day | ORAL | Status: DC

## 2020-05-20 NOTE — Progress Notes (Signed)
PROGRESS NOTE   Subjective/Complaints: No complaints this morning. Denies pain, constipation, insomnia.  Patient's chart reviewed- No issues reported overnight Vitals signs stable except for elevated SBP  ROS:  Pt denies SOB, abd pain, CP, N/V/C/D, and vision changes   Objective:   No results found. No results for input(s): WBC, HGB, HCT, PLT in the last 72 hours. No results for input(s): NA, K, CL, CO2, GLUCOSE, BUN, CREATININE, CALCIUM in the last 72 hours.  Intake/Output Summary (Last 24 hours) at 05/20/2020 1300 Last data filed at 05/20/2020 1140 Gross per 24 hour  Intake 420 ml  Output 650 ml  Net -230 ml        Physical Exam: Vital Signs Blood pressure (!) 150/80, pulse 81, temperature 98.4 F (36.9 C), temperature source Oral, resp. rate 18, height '5\' 6"'$  (1.676 m), weight 58.5 kg, SpO2 98 %. Gen: no distress, normal appearing HEENT: oral mucosa pink and moist, NCAT Cardio: Reg rate Chest: normal effort, normal rate of breathing Abd: soft, non-distended Ext: no edema Psych: pleasant, normal affect Skin: intact Motor:  RUE/RLE: 5/5 proximal distal  LUE/LLE: 0/5 proximal distal Increased tone noted in left upper extremity MAS 1+ to 2 on L elbow and L wrist.  , flexor withdrawal LLE   Assessment/Plan: 1. Functional deficits which require 3+ hours per day of interdisciplinary therapy in a comprehensive inpatient rehab setting.  Physiatrist is providing close team supervision and 24 hour management of active medical problems listed below.  Physiatrist and rehab team continue to assess barriers to discharge/monitor patient progress toward functional and medical goals  Care Tool:  Bathing    Body parts bathed by patient: Left arm,Chest,Abdomen,Front perineal area,Right lower leg,Right upper leg,Left upper leg,Face,Buttocks   Body parts bathed by helper: Right arm,Left lower leg     Bathing assist Assist  Level: Minimal Assistance - Patient > 75%     Upper Body Dressing/Undressing Upper body dressing   What is the patient wearing?: Pull over shirt    Upper body assist Assist Level: Minimal Assistance - Patient > 75%    Lower Body Dressing/Undressing Lower body dressing      What is the patient wearing?: Pants,Incontinence brief     Lower body assist Assist for lower body dressing: Moderate Assistance - Patient 50 - 74%     Toileting Toileting    Toileting assist Assist for toileting: Minimal Assistance - Patient > 75%     Transfers Chair/bed transfer  Transfers assist     Chair/bed transfer assist level: Minimal Assistance - Patient > 75% (MinA towards L, CGA towards R)     Locomotion Ambulation   Ambulation assist   Ambulation activity did not occur: Safety/medical concerns  Assist level: Maximal Assistance - Patient 25 - 49% Assistive device: No Device Max distance: 10'   Walk 10 feet activity   Assist  Walk 10 feet activity did not occur: Safety/medical concerns  Assist level: Maximal Assistance - Patient 25 - 49% Assistive device: No Device   Walk 50 feet activity   Assist Walk 50 feet with 2 turns activity did not occur: Safety/medical concerns  Assist level: Maximal Assistance - Patient 25 - 49%  Assistive device: No Device    Walk 150 feet activity   Assist Walk 150 feet activity did not occur: Safety/medical concerns         Walk 10 feet on uneven surface  activity   Assist Walk 10 feet on uneven surfaces activity did not occur: Safety/medical concerns         Wheelchair     Assist Will patient use wheelchair at discharge?: Yes Type of Wheelchair: Manual    Wheelchair assist level: Supervision/Verbal cueing Max wheelchair distance: 175    Wheelchair 50 feet with 2 turns activity    Assist    Wheelchair 50 feet with 2 turns activity did not occur: Safety/medical concerns   Assist Level: Supervision/Verbal  cueing   Wheelchair 150 feet activity     Assist  Wheelchair 150 feet activity did not occur: Safety/medical concerns   Assist Level: Supervision/Verbal cueing   Blood pressure (!) 150/80, pulse 81, temperature 98.4 F (36.9 C), temperature source Oral, resp. rate 18, height '5\' 6"'$  (1.676 m), weight 58.5 kg, SpO2 98 %. Medical Problem List and Plan: 1.  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  Impaired mobility: -DVT/anticoagulation: Conitnue SCDs             -antiplatelet therapy: Continue Aspirin 325 mg daily and Plavix 75 mg daily x3 months 3. Left shoulder pain: Tylenol as needed. Add voltaren gel and lidocaine patch.   4/17-patient reports pain is controlled-continue regimen 4. Depression: Continue Lexapro 10 mg daily             -antipsychotic agents: N/A 5. Neuropsych: This patient is capable of making decisions on his own behalf. 6. Skin/Wound Care: Routine skin checks 7. Fluids/Electrolytes/Nutrition: Routine in and outs poor intake po fluid , only 177 po in on 4/9  8.  Hypertension.  BP has been labile- continue Norvasc 10 mg daily.  Monitor with increased mobility.  Vitals:   05/19/20 2025 05/20/20 0344  BP: (!) 139/91 (!) 150/80  Pulse: 84 81  Resp: 17 18  Temp: 97.8 F (36.6 C) 98.4 F (36.9 C)  SpO2: 100% 98%  4/17 increase apresoline to '60mg'$  q8H 9.  Diabetes mellitus with hyperglycemia.  Hemoglobin A1c 7.3.  SSI.  Patient on Glucophage 500 mg twice daily prior to admission.  Resume as needed CBG (last 3)  Recent Labs    05/19/20 2200 05/20/20 0609 05/20/20 1131  GLUCAP 235* 101* 119*     Elevated in pm   4/3- start Tradjenta 5 mg daily and monitor , with elevated pm CBGs , trial amaryl  4/17: CBGs 101-235: increase amaryl to  '3mg'$  10.  Hyperlipidemia: Lipitor 11.  Polysubstance abuse.  Urine drug screen positive cocaine as well as marijuana.  ask neuropsych to provide  Counseling, pt plans to attend NA as OP   Tobacco abuse added nicoderm patch 55mg 12.  CKD stage III.    Creatinine 1.95 on 3/22,up to 2.44 on 3/28, 2.43 on 3/31, improved to 1.86 on 4/4 Stop IVF and enc po- recheck in am   4/12- Cr -at baseline , creeping up again IVF at night to resume 13. 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 Scheduled at 11pm as per patient's request. Will not increase during  day due to grogginess this morning.  Xeomin injection to Left pec, brachiorad , biceps and hamstrings 15.  Slow transit constipation    16.  Insomnia- will trial restoril- improved 17.  Left shoulder pain subacromial impingement, 4/13 cortisone injection left subacromial   -improved LOS: 27 days A FACE TO FACE EVALUATION WAS PERFORMED  Bobby Branch 05/20/2020, 1:00 PM

## 2020-05-20 NOTE — Discharge Summary (Signed)
Physician Discharge Summary  Patient ID: Bobby Branch MRN: [id number] DOB/AGE: 1960/11/14 60 y.o.  Admit date: 04/23/2020 Discharge date: 05/22/2020  Discharge Diagnoses:  Principal Problem:   Right middle cerebral artery stroke [facility]) Active Problems:   Protein-calorie malnutrition, severe   Slow transit constipation   Spastic hemiplegia affecting nondominant side (HCC)   Stage 3b chronic kidney disease ([place])   Essential hypertension   Controlled type 2 diabetes mellitus with hyperglycemia, without long-term current use of insulin (HCC) Hyperlipidemia Polysubstance abuse Slow transit constipation  Discharged Condition: Stable  Significant Diagnostic Studies: DG Chest 2 View  Result Date: 05/01/2020 CLINICAL DATA:  Cough, things are getting caught in the throat. EXAM: CHEST - 2 VIEW COMPARISON:  None. FINDINGS: Elevation of the right diaphragm. No focal consolidation. No pleural effusion or pneumothorax. Heart and mediastinal contours are unremarkable. No acute osseous abnormality. IMPRESSION: No active cardiopulmonary disease. Electronically Signed   By: Kathreen Devoid   On: 05/01/2020 18:04   DG Shoulder 1V Left  Result Date: 05/15/2020 CLINICAL DATA:  Left shoulder pain, recent stroke 3 weeks ago EXAM: LEFT SHOULDER COMPARISON:  None. FINDINGS: Limited single view exam. No gross malalignment. Negative for fracture. AC joint aligned. Included left chest unremarkable. IMPRESSION: No definite acute finding by plain radiography Electronically Signed   By: Jerilynn Mages.  Shick M.D.   On: 05/15/2020 08:49    Labs:  Basic Metabolic Panel: Recent Labs  Lab 05/17/20 0512  NA 135  K 4.6  CL 104  CO2 24  GLUCOSE 153*  BUN 33*  CREATININE 2.13*  CALCIUM 9.4    CBC: No results for input(s): WBC, NEUTROABS, HGB, HCT, MCV, PLT in the last 168 hours.  CBG: Recent Labs  Lab 05/20/20 2123 05/21/20 0626 05/21/20 1151 05/21/20 1600 05/21/20 2101  GLUCAP 219* 157* 112* 150* 155*   Family  history.  Positive for hypertension as well as hyperlipidemia.  Denies any colon cancer esophageal cancer or rectal cancer  Brief HPI:   Bobby Branch is a 60 y.o. right-handed male with history of CVA August 2021 with very mild residual deficits maintained on aspirin and Plavix, diabetes mellitus, CKD stage III, hypertension and hyperlipidemia.  Per chart review lives with girlfriend and modified independent.  Presented 04/20/2020 with acute onset of left-sided weakness.  Cranial CT scan showed asymmetric hyperdensity involving the right M2/M3 branches concerning for thrombus.  Few remote lacunar infarcts about the bilateral basal ganglia.  CT angiogram of head and neck as well as CT cerebral perfusion scan showed acute 15 cc core infarct involving the posterior right MCA distribution with a 67 cc surrounding ischemic penumbra.  Patient underwent revascularization per interventional radiology.  MRI follow-up showed right MCA patchy small acute infarct with possible petechial hemorrhage.  Carotid Dopplers with right 60 to 79% left ICA and 80 to 99%.  Echocardiogram with ejection fraction of 55 to 60% no wall motion abnormalities grade 1 diastolic dysfunction.  Admission chemistries glucose 231 creatinine 2.10 hemoglobin 13.6 hemoglobin A1c 7.3 urine drug screen positive cocaine as well as marijuana.  Initially maintained on Cleviprex for blood pressure control.  Neurology follow-up maintained on aspirin 325 mg daily and Plavix 75 mg daily x3 months.  In regards to patient's bilateral carotid stenosis Dr. Earleen Newport of interventional radiology to follow-up outpatient for potential revascularization treatment.  He was tolerating a regular consistency diet.  Therapy evaluations completed due to patient's left-sided weakness recommendations of physical medicine rehab consult and patient was admitted for a comprehensive rehab program.  Hospital Course: Bobby Branch was admitted to rehab 04/23/2020 for inpatient therapies  to consist of PT, ST and OT at least three hours five days a week. Past admission physiatrist, therapy team and rehab RN have worked together to provide customized collaborative inpatient rehab.  Pertaining to patient's right MCA distribution infarct with right M2/3 occlusion status post thrombectomy revascularization interventional radiology.  Remained on aspirin 325 mg daily and Plavix 75 mg day x3 months.  Work-up findings of bilateral carotid stenosis plan outpatient follow-up with interventional radiology to discuss intervention as outpatient.  Pain management use of Voltaren gel as well as a Lidoderm patch.  Mood stabilization with Lexapro emotional support provided he was attending full therapies and receive follow-up by neuropsychology.  Blood pressures controlled on Norvasc as well as hydralazine monitor outpatient.  Diabetes mellitus hemoglobin A1c 7.3 started on Amaryl 3 mg daily and monitored with full diabetic teaching.  Spasticity related to CVA Zanaflex 2 mg 3 times daily.  He did have a history of tobacco abuse NicoDerm patch initiated as well as urine drug screen positive marijuana cocaine with patient received counseling in regards to cessation of nicotine products as well as illicit drug use it was questionable if he would be compliant with these request.  Lipitor ongoing for hyperlipidemia.   Blood pressures were monitored on TID basis and controlled  Diabetes has been monitored with ac/hs CBG checks and SSI was use prn for tighter BS control.    Rehab course: During patient's stay in rehab weekly team conferences were held to monitor patient's progress, set goals and discuss barriers to discharge. At admission, patient required moderate assist stand pivot transfers moderate assist supine to sit moderate assist ADLs  Physical exam.  Blood pressure 160/90 pulse 84 temperature 98.2 respirations 17 oxygen saturations 92% room air Constitutional.  No acute distress HEENT Head.   Normocephalic and atraumatic Eyes.  Pupils round and reactive to light no discharge.nystagmus Neck.  Supple nontender no JVD without thyromegaly Cardiac regular rate and rhythm without extra sounds or murmur heard Abdomen.  Soft nontender positive bowel sounds without rebound Respiratory effort normal no respiratory distress without wheeze Skin.  Intact Neurologic.  Alert oriented x3 no acute distress.  Makes eye contact with examiner provides name and age follows commands fair awareness of deficits.  0/5 strength throughout left side, strength intact right side.  Sensation intact.  He/She  has had improvement in activity tolerance, balance, postural control as well as ability to compensate for deficits. He/She has had improvement in functional use RUE/LUE  and RLE/LLE as well as improvement in awareness.  Transferred to sitting edge of bed contact-guard with head of bed elevated.  Donned pants max assist for time management.  Transferred to wheelchair towards right squat pivot contact-guard.  Patient did demonstrate some difficulty putting left foot plate in place.  Wheelchair mobility to the therapy gym supervision.  Car transfers with low height perform minimal assist squat pivot.  Worked on patient completing his own toileting with hip hike and lateral leans.  Occupational Therapy reviewed directing self care with caregivers at home.  Patient did well expressing his needs.  Speech therapy intervention focused on cognition and caregiver education.  Full family teaching completed plan discharge to home       Disposition: Discharge to home    Diet: Diabetic diet  Special Instructions: No driving smoking or alcohol  Plan aspirin 325 mg daily and Plavix 75 mg daily x3 months per neurology services  Follow-up  interventional radiology Dr. Earleen Newport in regards to bilateral carotid stenosis as outpatient to discuss revascularization  Medications at discharge 1.  Tylenol as needed 2.  Ventolin  inhaler 1 puff every 6 hours as needed shortness of breath 3.  Norvasc 10 mg p.o. daily 4.  Aspirin 325 mg p.o. daily 5.  Lipitor 40 mg p.o. daily 6.  Plavix and 5 mg p.o. daily 7.  Voltaren gel 2 g 4 times daily 8.  Colace 100 mg p.o. twice daily 9.  Lexapro 10 mg p.o. daily 10.  Pepcid 20 mg p.o. daily 11.  Amaryl 3 mg p.o. daily with breakfast 12.  Lidoderm patch change as directed 13.  Multivitamin daily 14.  NicoDerm patch 7 mg daily x2 weeks and stop 15.  MiraLAX daily hold for loose stools 16.  Zanaflex 2 mg p.o. 3 times daily 17.  Hydralazine 50 mg every 8 hours    30 x 35 minutes were spent completing discharge summary and discharge planning  Discharge Instructions    Ambulatory referral to Neurology   Complete by: As directed    An appointment is requested in approximately 4 weeks right MCA infarction   Ambulatory referral to Physical Medicine Rehab   Complete by: As directed    Moderate complexity follow-up 1 to 2 weeks right MCA infarction       Follow-up Information    Kirsteins, Luanna Salk, MD Follow up.   Specialty: Physical Medicine and Rehabilitation Why: Office to call for appointment Contact information: [place] [address] [phone]        Corrie Mckusick, DO Follow up.   Specialties: Interventional Radiology, Radiology Why: Call for appointment to discuss evaluation for carotid stenosis and plan of care Contact information: [address] [place] [address] [phone]               Signed: Lavon Paganini [place] 05/22/2020, 5:12 AM

## 2020-05-21 ENCOUNTER — Other Ambulatory Visit (HOSPITAL_COMMUNITY): Payer: Self-pay

## 2020-05-21 LAB — GLUCOSE, CAPILLARY
Glucose-Capillary: 157 mg/dL — ABNORMAL HIGH (ref 70–99)
Glucose-Capillary: 112 mg/dL — ABNORMAL HIGH (ref 70–99)
Glucose-Capillary: 150 mg/dL — ABNORMAL HIGH (ref 70–99)
Glucose-Capillary: 155 mg/dL — ABNORMAL HIGH (ref 70–99)

## 2020-05-21 MED ORDER — HYDRALAZINE HCL 10 MG PO TABS
60.0000 mg | ORAL_TABLET | Freq: Three times a day (TID) | ORAL | 0 refills | Status: DC
  Filled 2020-05-21: qty 90, 5d supply, fill #0

## 2020-05-21 MED ORDER — HYDRALAZINE HCL 50 MG PO TABS
50.0000 mg | ORAL_TABLET | Freq: Three times a day (TID) | ORAL | 0 refills | Status: DC
  Filled 2020-05-21: qty 90, 30d supply, fill #0

## 2020-05-21 MED ORDER — ALBUTEROL SULFATE HFA 108 (90 BASE) MCG/ACT IN AERS
1.0000 | INHALATION_SPRAY | Freq: Four times a day (QID) | RESPIRATORY_TRACT | 0 refills | Status: DC | PRN
  Filled 2020-05-21: qty 8.5, 25d supply, fill #0

## 2020-05-21 MED ORDER — CLOPIDOGREL BISULFATE 75 MG PO TABS
75.0000 mg | ORAL_TABLET | Freq: Every day | ORAL | 2 refills | Status: DC
  Filled 2020-05-21: qty 30, 30d supply, fill #0

## 2020-05-21 MED ORDER — ASPIRIN 325 MG PO TBEC: 325.0000 mg | DELAYED_RELEASE_TABLET | Freq: Every day | ORAL | 0 refills | Status: DC

## 2020-05-21 MED ORDER — AMLODIPINE BESYLATE 10 MG PO TABS
10.0000 mg | ORAL_TABLET | Freq: Every day | ORAL | 0 refills | Status: DC
  Filled 2020-05-21: qty 30, 30d supply, fill #0

## 2020-05-21 MED ORDER — NICOTINE 7 MG/24HR TD PT24
7.0000 mg | MEDICATED_PATCH | Freq: Every day | TRANSDERMAL | 0 refills | Status: DC
  Filled 2020-05-21: qty 14, 14d supply, fill #0

## 2020-05-21 MED ORDER — DICLOFENAC SODIUM 1 % EX GEL
2.0000 g | Freq: Four times a day (QID) | CUTANEOUS | 0 refills | Status: DC
  Filled 2020-05-21: qty 100, 7d supply, fill #0

## 2020-05-21 MED ORDER — ADULT MULTIVITAMIN W/MINERALS CH: 1.0000 | ORAL_TABLET | Freq: Every day | ORAL | Status: AC

## 2020-05-21 MED ORDER — TIZANIDINE HCL 2 MG PO TABS
2.0000 mg | ORAL_TABLET | Freq: Three times a day (TID) | ORAL | 0 refills | Status: DC
  Filled 2020-05-21: qty 90, 30d supply, fill #0

## 2020-05-21 MED ORDER — ESCITALOPRAM OXALATE 10 MG PO TABS
10.0000 mg | ORAL_TABLET | Freq: Every day | ORAL | 0 refills | Status: DC
  Filled 2020-05-21: qty 30, 30d supply, fill #0

## 2020-05-21 MED ORDER — GLIMEPIRIDE 2 MG PO TABS
3.0000 mg | ORAL_TABLET | Freq: Every day | ORAL | 0 refills | Status: DC
  Filled 2020-05-21: qty 45, 30d supply, fill #0

## 2020-05-21 MED ORDER — DOCUSATE SODIUM 100 MG PO CAPS: 100.0000 mg | ORAL_CAPSULE | Freq: Two times a day (BID) | ORAL | 0 refills | Status: DC

## 2020-05-21 MED ORDER — ACETAMINOPHEN 325 MG PO TABS: 650.0000 mg | ORAL_TABLET | ORAL | Status: AC | PRN

## 2020-05-21 MED ORDER — ATORVASTATIN CALCIUM 40 MG PO TABS
40.0000 mg | ORAL_TABLET | Freq: Every day | ORAL | 0 refills | Status: DC
  Filled 2020-05-21: qty 30, 30d supply, fill #0

## 2020-05-21 MED ORDER — FAMOTIDINE 20 MG PO TABS
20.0000 mg | ORAL_TABLET | Freq: Every day | ORAL | 0 refills | Status: DC
  Filled 2020-05-21: qty 30, 30d supply, fill #0

## 2020-05-21 MED ORDER — POLYETHYLENE GLYCOL 3350 17 G PO PACK: 17.0000 g | PACK | Freq: Every day | ORAL | 0 refills | Status: DC

## 2020-05-21 MED ORDER — LIDOCAINE 5 % EX PTCH
1.0000 | MEDICATED_PATCH | CUTANEOUS | 0 refills | Status: DC
  Filled 2020-05-21: qty 30, 30d supply, fill #0

## 2020-05-21 NOTE — Plan of Care (Signed)
  Problem: Consults Goal: RH STROKE PATIENT EDUCATION Description: See Patient Education module for education specifics  Outcome: Progressing   Problem: RH BOWEL ELIMINATION Goal: RH STG MANAGE BOWEL WITH ASSISTANCE Description: STG Manage Bowel with Mod I Assistance. Outcome: Progressing Goal: RH STG MANAGE BOWEL W/MEDICATION W/ASSISTANCE Description: STG Manage Bowel with Medication with Mod I Assistance. Outcome: Progressing   Problem: RH BLADDER ELIMINATION Goal: RH STG MANAGE BLADDER WITH ASSISTANCE Description: STG Manage Bladder With Mod I Assistance Outcome: Progressing   Problem: RH SAFETY Goal: RH STG ADHERE TO SAFETY PRECAUTIONS W/ASSISTANCE/DEVICE Description: STG Adhere to Safety Precautions With mod I Assistance/Device. Outcome: Progressing Goal: RH STG DECREASED RISK OF FALL WITH ASSISTANCE Description: STG Decreased Risk of Fall With Mod I Assistance. Outcome: Progressing   Problem: RH COGNITION-NURSING Goal: RH STG USES MEMORY AIDS/STRATEGIES W/ASSIST TO PROBLEM SOLVE Description: STG Uses Memory Aids/Strategies With cues/reminders Assistance to Problem Solve. Outcome: Progressing Goal: RH STG ANTICIPATES NEEDS/CALLS FOR ASSIST W/ASSIST/CUES Description: STG Anticipates Needs/Calls for Assist With Assistance/Cues. Outcome: Progressing   Problem: RH KNOWLEDGE DEFICIT Goal: RH STG INCREASE KNOWLEDGE OF HYPERTENSION Description: Patient and girlfriend will be able to manage secondary stroke risks including HTN, HLD , DM with medications and dietary modifications and stroke prophylaxis using handouts and educational materials independently   Outcome: Progressing

## 2020-05-21 NOTE — Progress Notes (Signed)
Speech Language Pathology Discharge Summary  Patient Details  Name: Bobby Branch MRN: [id number] Date of Birth: [birth year]  Today's Date: 05/21/2020 SLP Individual Time:  - 11:15-11:45     Skilled Therapeutic Interventions:  Skilled SLP intervention focused on patient education. Pt was lying in bed for session. Education completed on strategies for memory, attention during functional tasks at home, and safety. Pt verbalized understanding. Pt very lethargic at end of session and falling asleep. Session ended 15 min early. Caregiver not present for education but was contacted via phone on Saturday with discharge education.     Patient has met 3 of 4 long term goals.  Patient to discharge at overall Supervision level.  Reasons goals not met: Min A required for Awareness   Clinical Impression/Discharge Summary:   Pt has met 3/4 long term goals this admission due to improved attention with functional tasks, problem solving, memory and use of recall strategies. Pt is currently overall Supervision A for cognition but continues to require cueing for safety awareness. Family education completed via phone conversation with friend and he will discharge home with 24 hour supervision from friend. Pt will benefit from home health slp services to maximize his functional independence.  Care Partner:  Caregiver Able to Provide Assistance: Yes  Type of Caregiver Assistance: Cognitive;Physical  Recommendation:  24 hour supervision/assistance  Rationale for SLP Follow Up: Maximize functional communication;Maximize cognitive function and independence;Reduce caregiver burden   Equipment: none at this time   Reasons for discharge: Discharged from [place] 05/21/2020, 3:39 PM

## 2020-05-21 NOTE — Progress Notes (Signed)
PROGRESS NOTE   Subjective/Complaints:  Pt states his girlfriend's car had trouble yesterday and could not visit   ROS:  Pt denies SOB, abd pain, CP, N/V/C/D, and vision changes   Objective:   No results found. No results for input(s): WBC, HGB, HCT, PLT in the last 72 hours. No results for input(s): NA, K, CL, CO2, GLUCOSE, BUN, CREATININE, CALCIUM in the last 72 hours.  Intake/Output Summary (Last 24 hours) at 05/21/2020 0759 Last data filed at 05/21/2020 0451 Gross per 24 hour  Intake 1017 ml  Output 925 ml  Net 92 ml        Physical Exam: Vital Signs Blood pressure (!) 163/92, pulse 79, temperature 98.7 F (37.1 C), temperature source Oral, resp. rate 18, height '5\' 6"'$  (1.676 m), weight 58.5 kg, SpO2 96 %.  General: No acute distress Mood and affect are appropriate Heart: Regular rate and rhythm no rubs murmurs or extra sounds Lungs: Clear to auscultation, breathing unlabored, no rales or wheezes Abdomen: Positive bowel sounds, soft nontender to palpation, nondistended Extremities: No clubbing, cyanosis, or edema Skin: No evidence of breakdown, no evidence of rash Motor:  RUE/RLE: 5/5 proximal distal  LUE/LLE: 0/5 proximal distal Increased tone noted in left upper extremity MAS 1+ to 2 on L elbow and L wrist.  , flexor withdrawal LLE   Assessment/Plan: 1. Functional deficits which require 3+ hours per day of interdisciplinary therapy in a comprehensive inpatient rehab setting.  Physiatrist is providing close team supervision and 24 hour management of active medical problems listed below.  Physiatrist and rehab team continue to assess barriers to discharge/monitor patient progress toward functional and medical goals  Care Tool:  Bathing    Body parts bathed by patient: Left arm,Chest,Abdomen,Front perineal area,Right lower leg,Right upper leg,Left upper leg,Face,Buttocks   Body parts bathed by helper:  Right arm,Left lower leg     Bathing assist Assist Level: Minimal Assistance - Patient > 75%     Upper Body Dressing/Undressing Upper body dressing   What is the patient wearing?: Pull over shirt    Upper body assist Assist Level: Minimal Assistance - Patient > 75%    Lower Body Dressing/Undressing Lower body dressing      What is the patient wearing?: Pants,Incontinence brief     Lower body assist Assist for lower body dressing: Moderate Assistance - Patient 50 - 74%     Toileting Toileting    Toileting assist Assist for toileting: Minimal Assistance - Patient > 75%     Transfers Chair/bed transfer  Transfers assist     Chair/bed transfer assist level: Minimal Assistance - Patient > 75% (MinA towards L, CGA towards R)     Locomotion Ambulation   Ambulation assist   Ambulation activity did not occur: Safety/medical concerns  Assist level: Maximal Assistance - Patient 25 - 49% Assistive device: No Device Max distance: 10'   Walk 10 feet activity   Assist  Walk 10 feet activity did not occur: Safety/medical concerns  Assist level: Maximal Assistance - Patient 25 - 49% Assistive device: No Device   Walk 50 feet activity   Assist Walk 50 feet with 2 turns activity did not occur:  Safety/medical concerns  Assist level: Maximal Assistance - Patient 25 - 49% Assistive device: No Device    Walk 150 feet activity   Assist Walk 150 feet activity did not occur: Safety/medical concerns         Walk 10 feet on uneven surface  activity   Assist Walk 10 feet on uneven surfaces activity did not occur: Safety/medical concerns         Wheelchair     Assist Will patient use wheelchair at discharge?: Yes Type of Wheelchair: Manual    Wheelchair assist level: Supervision/Verbal cueing Max wheelchair distance: 175    Wheelchair 50 feet with 2 turns activity    Assist    Wheelchair 50 feet with 2 turns activity did not occur:  Safety/medical concerns   Assist Level: Supervision/Verbal cueing   Wheelchair 150 feet activity     Assist  Wheelchair 150 feet activity did not occur: Safety/medical concerns   Assist Level: Supervision/Verbal cueing   Blood pressure (!) 163/92, pulse 79, temperature 98.7 F (37.1 C), temperature source Oral, resp. rate 18, height '5\' 6"'$  (1.676 m), weight 58.5 kg, SpO2 96 %. Medical Problem List and Plan: 1.  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  Impaired mobility: -DVT/anticoagulation: Conitnue SCDs             -antiplatelet therapy: Continue Aspirin 325 mg daily and Plavix 75 mg daily x3 months 3. Left shoulder pain: Tylenol as needed. Add voltaren gel and lidocaine patch.   4/17-patient reports pain is controlled-continue regimen 4. Depression: Continue Lexapro 10 mg daily             -antipsychotic agents: N/A 5. Neuropsych: This patient is capable of making decisions on his own behalf. 6. Skin/Wound Care: Routine skin checks 7. Fluids/Electrolytes/Nutrition: Routine in and outs poor intake po fluid , only 177 po in on 4/9  8.  Hypertension.  BP has been labile- continue Norvasc 10 mg daily.  Monitor with increased mobility.  Vitals:   05/21/20 0446 05/21/20 0543  BP: (!) 171/88 (!) 163/92  Pulse: 82 79  Resp: 18   Temp: 98.7 F (37.1 C)   SpO2: 96%   4/17 increase apresoline to '60mg'$  q8H 9.  Diabetes mellitus with hyperglycemia.  Hemoglobin A1c 7.3.  SSI.  Patient on Glucophage 500 mg twice daily prior to admission.  Resume as needed CBG (last 3)  Recent Labs    05/20/20 1640 05/20/20 2123 05/21/20 0626  GLUCAP 103* 219* 157*     Elevated in pm - pt like sweet tea an dchips, discussed need to switch to diet tea  4/3- start Tradjenta 5  mg daily and monitor , with elevated pm CBGs , trial amaryl  4/17: CBGs 101-235: increase amaryl to '3mg'$ - may take several days to monitor effect , f/u with PCP as OP  10.  Hyperlipidemia: Lipitor 11.  Polysubstance abuse.  Urine drug screen positive cocaine as well as marijuana.  ask neuropsych to provide  Counseling, pt plans to attend [facility] as OP   Tobacco abuse added nicoderm patch 25mg 12.  CKD stage III.    Creatinine baseline aroung 2.0 , cont antihypertensive meds , enc fluids   13. 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 Scheduled at 11pm as per patient's request. Will not increase during day due to grogginess this morning.  Xeomin injection to Left pec, brachiorad , biceps and hamstrings on 4/15 15.  Slow transit constipation    16.  Insomnia- will trial restoril- improved 17.  Left shoulder pain subacromial impingement, 4/13 cortisone injection left subacromial   -improved LOS: 28 days A FACE TO FACE EVALUATION WAS PERFORMED  Charlett Blake 05/21/2020, 7:59 AM

## 2020-05-21 NOTE — Progress Notes (Signed)
Physical Therapy Session Note  Patient Details  Name: Bobby Branch MRN: [id number] Date of Birth: [birth year]  Today's Date: 05/21/2020 PT Individual Time: 1300-1415 PT Individual Time Calculation (min): 75 min   Short Term Goals: Week 3:  PT Short Term Goal 1 (Week 3): Pt will perform sit to stand min assist. PT Short Term Goal 1 - Progress (Week 3): Met PT Short Term Goal 2 (Week 3): Pt will perform bed mobility min assist. PT Short Term Goal 2 - Progress (Week 3): Met PT Short Term Goal 3 (Week 3): Pt will perform bed to chair transfer min assist. PT Short Term Goal 3 - Progress (Week 3): Met PT Short Term Goal 4 (Week 3): Pt will ambulate >75ft with LRAD with mod assist +2. PT Short Term Goal 4 - Progress (Week 3): Progressing toward goal  Skilled Therapeutic Interventions/Progress Updates: Pt presents sitting up in bed eating lunch.  Pt agreeable to therapy and GF present for family ed.  Pt transfers sup to sit w/ CGA and verbal cues.  Verbal directions given to GF to allow for pt initiation for all transfers.  GF often moves pt 2/2 speed.  Education given to GF for positioning of w/c and pt use of RUE for all transfers.  Education for blocking L knee for safe transfers.  Pt performed transfers to/from high bed, car, bed, shower chair w/ GF assist at min A.  Education on w/c positioning, leg rest placement/removal, brakes etc.  GF demonstrates understanding and performance although will probably allow pt less ability for independence in home setting.  Pt required use of commode.  Pt stood w/ CGA and Stedy to go into BR and continent of stool and urine.  Pt able to perform pericare w/ toilet paper and warm cloth to complete.  Pt wheeled to bed with Stedy and remained sitting EOB, nursing hand-off .     Therapy Documentation Precautions:  Precautions Precautions: Fall,Other (comment) Precaution Comments: L hemiplegia, L inattention Restrictions Weight Bearing Restrictions: No General:    Vital Signs:  Pain:0/10, unless move my L shoulder too much. Pain Assessment Pain Scale: Faces Faces Pain Scale: No hurt Mobility:   Locomotion :    Trunk/Postural Assessment :    Balance: Balance Balance Assessed: Yes Dynamic Sitting Balance Dynamic Sitting - Balance Support: During functional activity Dynamic Sitting - Level of Assistance: 4: Min assist Dynamic Standing Balance Dynamic Standing - Balance Support: During functional activity;No upper extremity supported Dynamic Standing - Level of Assistance: 2: Max assist Dynamic Standing - Comments: Balance scores recorded per most recent documentation from primary therapist Exercises:   Other Treatments:      Therapy/Group: Individual Therapy  [name] 05/21/2020, 2:17 PM

## 2020-05-21 NOTE — Plan of Care (Signed)
  Problem: RH Eating Goal: LTG Patient will perform eating w/assist, cues/equip (OT) Description: LTG: Patient will perform eating with assist, with/without cues using equipment (OT) Outcome: Not Met (add Reason) Note: Goal not met due to pt still requiring setup at time of d/c   Problem: RH Dressing Goal: LTG Patient will perform upper body dressing (OT) Description: LTG Patient will perform upper body dressing with assist, with/without cues (OT). Outcome: Not Met (add Reason) Note: Not met as pt still requires Min A for UB dressing   Problem: RH Functional Use of Upper Extremity Goal: LTG Patient will use RT/LT upper extremity as a (OT) Description: LTG: Patient will use right/left upper extremity as a stabilizer/gross assist/diminished/nondominant/dominant level with assist, with/without cues during functional activity (OT) Outcome: Not Met (add Reason) Note: Pt at this time requires Max A to functionally integrate his affected upper limb   Problem: Sit to Stand Goal: LTG:  Patient will perform sit to stand in prep for activites of daily living with assistance level (OT) Description: LTG:  Patient will perform sit to stand in prep for activites of daily living with assistance level (OT) Outcome: Completed/Met   Problem: RH Bathing Goal: LTG Patient will bathe all body parts with assist levels (OT) Description: LTG: Patient will bathe all body parts with assist levels (OT) Outcome: Completed/Met   Problem: RH Dressing Goal: LTG Patient will perform lower body dressing w/assist (OT) Description: LTG: Patient will perform lower body dressing with assist, with/without cues in positioning using equipment (OT) Outcome: Completed/Met   Problem: RH Toileting Goal: LTG Patient will perform toileting task (3/3 steps) with assistance level (OT) Description: LTG: Patient will perform toileting task (3/3 steps) with assistance level (OT)  Outcome: Completed/Met   Problem: RH Toilet  Transfers Goal: LTG Patient will perform toilet transfers w/assist (OT) Description: LTG: Patient will perform toilet transfers with assist, with/without cues using equipment (OT) Outcome: Completed/Met   Problem: RH Tub/Shower Transfers Goal: LTG Patient will perform tub/shower transfers w/assist (OT) Description: LTG: Patient will perform tub/shower transfers with assist, with/without cues using equipment (OT) Outcome: Completed/Met   Problem: RH Memory Goal: LTG Patient will demonstrate ability for day to day recall/carry over during activities of daily living with assistance level (OT) Description: LTG:  Patient will demonstrate ability for day to day recall/carry over during activities of daily living with assistance level (OT). Outcome: Completed/Met   Problem: RH Attention Goal: LTG Patient will demonstrate this level of attention during functional activites (OT) Description: LTG:  Patient will demonstrate this level of attention during functional activites  (OT) Outcome: Completed/Met   Problem: RH Awareness Goal: LTG: Patient will demonstrate awareness during functional activites type of (OT) Description: LTG: Patient will demonstrate awareness during functional activites type of (OT) Outcome: Completed/Met

## 2020-05-21 NOTE — Progress Notes (Signed)
Physical Therapy Discharge Summary  Patient Details  Name: Bobby Branch MRN: [id number] Date of Birth: 06/12/1960  Today's Date: 05/21/2020 PT Individual Time: 1300-1415 PT Individual Time Calculation (min): 75 min    Patient has met 7 of 10 long term goals due to improved balance, increased strength and improved coordination.  Patient to discharge at a wheelchair level [place].   Patient's care partner is independent to provide the necessary physical assistance at discharge.  Reasons goals not met: Flaccid LLE, amb goals downgraded.  Recommendation:  Patient will benefit from ongoing skilled PT services in home health setting to continue to advance safe functional mobility, address ongoing impairments in strength, transfers, and minimize fall risk.  Equipment: wheelchair, shower chair, commode  Reasons for discharge: discharge from hospital  Patient/family agrees with progress made and goals achieved: Yes  PT Discharge Precautions/Restrictions Precautions Precautions: Fall;Other (comment) Precaution Comments: L hemiplegia, L inattention Restrictions Weight Bearing Restrictions: No Vital Signs Therapy Vitals Temp: 97.6 F (36.4 C) Pulse Rate: 73 Resp: 20 BP: (!) 87/66 Patient Position (if appropriate): Lying Oxygen Therapy SpO2: 100 % O2 Device: Room Air Pain Pain Assessment Pain Scale: 0-10 Pain Score: 0-No pain Vision/Perception  Perception Perception: Impaired Inattention/Neglect: Does not attend to left side of body;Does not attend to left visual field  Cognition Overall Cognitive Status: Impaired/Different from baseline Arousal/Alertness: Awake/alert Orientation Level: Oriented X4 Attention: Selective;Sustained Focused Attention: Impaired Sensation Sensation Light Touch: Appears Intact Coordination Gross Motor Movements are Fluid and Coordinated: No Fine Motor Movements are Fluid and Coordinated: No Coordination and Movement Description: Lt  hemiplegia with flexor synergies Finger Nose Finger Test: Unable to complete on the Lt Motor  Motor Motor: Hemiplegia;Abnormal tone;Abnormal postural alignment and control Motor - Skilled Clinical Observations: LUE and LLE hemiparesis Motor - Discharge Observations: Still with significant LUE and LLE hemiparesis  Mobility Bed Mobility Bed Mobility: Sit to Supine;Supine to Sit Left Sidelying to Sit: Contact Guard/Touching assist Supine to Sit: Contact Guard/Touching assist Sit to Supine: Contact Guard/Touching assist Transfers Transfers: Squat Pivot Transfers;Sit to Stand;Stand to Sit;Stand Pivot Transfers Sit to Stand: Minimal Assistance - Patient > 75% Stand to Sit: Minimal Assistance - Patient > 75% Stand Pivot Transfers: Moderate Assistance - Patient 50 - 74% Stand Pivot Transfer Details: Verbal cues for precautions/safety;Verbal cues for sequencing Squat Pivot Transfers: Moderate Assistance - Patient 50-74% Transfer (Assistive device): None Locomotion  Gait Ambulation: Yes Gait Assistance: Moderate Assistance - Patient 50-74% Gait Distance (Feet): 10 Feet Gait Gait velocity: reduced Stairs / Additional Locomotion Stairs: No Wheelchair Mobility Wheelchair Mobility: Yes Wheelchair Assistance: Supervision/Verbal cueing Wheelchair Propulsion: Right upper extremity;Right lower extremity Wheelchair Parts Management: Needs assistance;Other (comment) (education given to GF,pt able to perform brakes w/ direction.) Distance: 100  Trunk/Postural Assessment  Cervical Assessment Cervical Assessment: Within Functional Limits Thoracic Assessment Thoracic Assessment: Within Functional Limits Lumbar Assessment Lumbar Assessment: Within Functional Limits  Balance Balance Balance Assessed: Yes Static Sitting Balance Static Sitting - Balance Support: Feet supported Static Sitting - Level of Assistance: 5: Stand by assistance Dynamic Sitting Balance Dynamic Sitting - Balance Support:  During functional activity Dynamic Sitting - Level of Assistance: 4: Min assist Static Standing Balance Static Standing - Balance Support: During functional activity;Right upper extremity supported Static Standing - Level of Assistance: 4: Min assist Dynamic Standing Balance Dynamic Standing - Balance Support: During functional activity;No upper extremity supported Dynamic Standing - Level of Assistance: 2: Max assist Dynamic Standing - Comments: Balance scores recorded per most recent documentation from primary therapist  Extremity Assessment  RUE Assessment RUE Assessment: Within Functional Limits General Strength Comments: Strength 4/5 throughout LUE Assessment LUE Assessment: Exceptions to [facility] Passive Range of Motion (PROM) Comments: Limted shoulder flexion to aproximately 80 degrees secondary to increased glenohumeral pain.  Increased tone in the biceps, digit flexors, and shoulder adductor General Strength Comments: Brunnstrum stage II in the arm and hand with increased tone.  Slight shoulder flexion noted but no active movement in the digits.  Increased tone as noted above. He needs max assist to integrate to session as a stabilizer. RLE Assessment RLE Assessment: Within Functional Limits General Strength Comments: grossly 4/5 LLE Assessment LLE Assessment: Exceptions to [facility] General Strength Comments: possible trace hip.    [name] 05/21/2020, 4:23 PM

## 2020-05-21 NOTE — Progress Notes (Signed)
Patient ID: Bobby Branch, male   DOB: 01-04-1961, 60 y.o.   MRN: [id number]  This SW covering for primary [place].   Pt entered into [facility] medication assistance program.   SW called pt dtr [name] [phone]) to inform on pt being entered into medication assistance program and that pharmacy will call to discuss cost. SW informed Chrisitna will follow-up tomorrow when she is in the office to discuss if pt is eligible for charity [facility]   Loralee Pacas, MSW, [place] Office: [phone] Cell: [phone] Fax: [phone]

## 2020-05-21 NOTE — Progress Notes (Signed)
Occupational Therapy Session Note  Patient Details  Name: Autry Zarek MRN: [id number] Date of Birth: [birth year]  Today's Date: 05/21/2020 OT Individual Time: 1000-1058 OT Individual Time Calculation (min): 58 min   Skilled Therapeutic Interventions/Progress Updates:    Pt greeted in bed with c/o significant fatigue due to not sleeping well last night. He declined getting up in the chair. Pt with encouragement willing to participate in tx while sitting EOB. Supervision for supine<sit with use of the bedrail. Started with oral care completion, pt using one handed strategies to meet demands of task. Transitioned to going over exercises for his Lt UE HEP. Tried to have pt read exercises and then perform independently via self ROM, however pt needed considerable cues to exhibit understanding of exercise techniques. Pt reported having some Lt shoulder pain. Pt was premedicated but he reported his pain increased a little during UE exercises due to developing flexor synergies. He reported tone felt worse today due to not sleeping well last night. He returned to bed at close of session, left him with all needs within reach and bed alarm set. Reminded him of proper Lt UE positioning in bed and assisted him with implementing positioning strategies before departure.    Note that at start of session we called his ex girlfriend to ask when she was coming in for family education. She did not pick up the phone and did not have a voice mail set up so we could not leave a message. When she called back (right before end of session), she reported arriving here a little after 11am.    Therapy Documentation Precautions:  Precautions Precautions: Fall,Other (comment) Precaution Comments: L hemiplegia, L inattention Restrictions Weight Bearing Restrictions: No ADL: ADL Eating: Set up Where Assessed-Eating: Wheelchair Grooming: Setup Where Assessed-Grooming: Wheelchair Upper Body Bathing: Minimal assistance Where  Assessed-Upper Body Bathing: Shower,Chair Lower Body Bathing: Minimal assistance Where Assessed-Lower Body Bathing: Shower,Chair Upper Body Dressing: Minimal assistance Where Assessed-Upper Body Dressing: Chair Lower Body Dressing: Moderate assistance Where Assessed-Lower Body Dressing: Wheelchair Toileting: Minimal assistance Where Assessed-Toileting: Bedside Commode Toilet Transfer: Minimal assistance Toilet Transfer Method: Squat pivot Toilet Transfer Equipment: Drop arm bedside commode Tub/Shower Transfer: Moderate assistance Tub/Shower Transfer Method: Squat pivot Tub/Shower Equipment: Facilities manager: Moderate assistance Social research officer, government Method: Brewing technologist ADL Comments: ADL scores recorded per most recent documentation from primary OT      Therapy/Group: Individual Therapy  Jayla Mackie A Paxtyn Wisdom 05/21/2020, 12:46 PM

## 2020-05-22 LAB — GLUCOSE, CAPILLARY
Glucose-Capillary: 126 mg/dL — ABNORMAL HIGH (ref 70–99)
Glucose-Capillary: 109 mg/dL — ABNORMAL HIGH (ref 70–99)

## 2020-05-22 NOTE — Discharge Instructions (Signed)
Inpatient Rehab Discharge Instructions  Bobby Branch Discharge date and time: No discharge date for patient encounter.   Activities/Precautions/ Functional Status: Activity: activity as tolerated Diet: diabetic diet Wound Care: Routine skin checks Functional status:  ___ No restrictions     ___ Walk up steps independently ___ 24/7 supervision/assistance   ___ Walk up steps with assistance ___ Intermittent supervision/assistance  ___ Bathe/dress independently ___ Walk with walker     _x__ Bathe/dress with assistance ___ Walk Independently    ___ Shower independently ___ Walk with assistance    ___ Shower with assistance ___ No alcohol     ___ Return to work/school ________  COMMUNITY REFERRALS UPON DISCHARGE:    Home Health:   PT    OT     ST                    Agency: TBD  Phone:    Medical Equipment/Items Ordered: Wheelchair, Drop Arm Commode, Tub Bench                                                 Agency/Supplier: [employer]   Special Instructions: No driving smoking or alcohol  Continue aspirin 325 mg daily and Plavix 75 mg daily times a total of 3 months  Follow-up Dr. Earleen Newport interventional radiology [phone] to discuss intervention for carotid stenosis STROKE/TIA DISCHARGE INSTRUCTIONS SMOKING Cigarette smoking nearly doubles your risk of having a stroke & is the single most alterable risk factor  If you smoke or have smoked in the last 12 months, you are advised to quit smoking for your health.  Most of the excess cardiovascular risk related to smoking disappears within a year of stopping.  Ask you doctor about anti-smoking medications  [place] Quit Line: 1-800-QUIT NOW  Free Smoking Cessation Classes [phone]  CHOLESTEROL Know your levels; limit fat & cholesterol in your diet  Lipid Panel     Component Value Date/Time   CHOL 229 (H) 04/20/2020 0500   TRIG 238 (H) 04/20/2020 0500   HDL 83 04/20/2020 0500   CHOLHDL 2.8 04/20/2020 0500   VLDL 48 (H)  04/20/2020 0500   LDLCALC 98 04/20/2020 0500      Many patients benefit from treatment even if their cholesterol is at goal.  Goal: Total Cholesterol (CHOL) less than 160  Goal:  Triglycerides (TRIG) less than 150  Goal:  HDL greater than 40  Goal:  LDL (LDLCALC) less than 100   BLOOD PRESSURE American Stroke Association blood pressure target is less that 120/80 mm/Hg  Your discharge blood pressure is:  BP: (!) 174/91  Monitor your blood pressure  Limit your salt and alcohol intake  Many individuals will require more than one medication for high blood pressure  DIABETES (A1c is a blood sugar average for last 3 months) Goal HGBA1c is under 7% (HBGA1c is blood sugar average for last 3 months)  Diabetes:    Lab Results  Component Value Date   HGBA1C 7.3 (H) 04/20/2020     Your HGBA1c can be lowered with medications, healthy diet, and exercise.  Check your blood sugar as directed by your physician  Call your physician if you experience unexplained or low blood sugars.  PHYSICAL ACTIVITY/REHABILITATION Goal is 30 minutes at least 4 days per week  Activity: Increase activity slowly, Therapies: Physical Therapy: Home  Health Return to work:   Activity decreases your risk of heart attack and stroke and makes your heart stronger.  It helps control your weight and blood pressure; helps you relax and can improve your mood.  Participate in a regular exercise program.  Talk with your doctor about the best form of exercise for you (dancing, walking, swimming, cycling).  DIET/WEIGHT Goal is to maintain a healthy weight  Your discharge diet is:  Diet Order            Diet Carb Modified Fluid consistency: Thin; Room service appropriate? Yes  Diet effective now                 liquids Your height is:  Height: '5\' 6"'$  (167.6 cm) Your current weight is: Weight: 49.2 kg Your Body Mass Index (BMI) is:  BMI (Calculated): 17.52  Following the type of diet specifically designed for you  will help prevent another stroke.  Your goal weight range is:    Your goal Body Mass Index (BMI) is 19-24.  Healthy food habits can help reduce 3 risk factors for stroke:  High cholesterol, hypertension, and excess weight.  RESOURCES Stroke/Support Group:  Call [phone]   STROKE EDUCATION PROVIDED/REVIEWED AND GIVEN TO PATIENT Stroke warning signs and symptoms How to activate emergency medical system (call 911). Medications prescribed at discharge. Need for follow-up after discharge. Personal risk factors for stroke. Pneumonia vaccine given:  Flu vaccine given:  My questions have been answered, the writing is legible, and I understand these instructions.  I will adhere to these goals & educational materials that have been provided to me after my discharge from the hospital.      My questions have been answered and I understand these instructions. I will adhere to these goals and the provided educational materials after my discharge from the hospital.  Patient/Caregiver Signature _______________________________ Date __________  Clinician Signature _______________________________________ Date __________  Please bring this form and your medication list with you to all your follow-up doctor's appointments.

## 2020-05-22 NOTE — Progress Notes (Signed)
PROGRESS NOTE   Subjective/Complaints:  PT completed family ed yesterday   ROS:  Pt denies SOB, abd pain, CP, N/V/C/D, and vision changes   Objective:   No results found. No results for input(s): WBC, HGB, HCT, PLT in the last 72 hours. No results for input(s): NA, K, CL, CO2, GLUCOSE, BUN, CREATININE, CALCIUM in the last 72 hours.  Intake/Output Summary (Last 24 hours) at 05/22/2020 0751 Last data filed at 05/22/2020 0300 Gross per 24 hour  Intake --  Output 450 ml  Net -450 ml        Physical Exam: Vital Signs Blood pressure 118/70, pulse 87, temperature 97.9 F (36.6 C), temperature source Oral, resp. rate 20, height '5\' 6"'$  (1.676 m), weight 58.7 kg, SpO2 97 %.  General: No acute distress Mood and affect are appropriate Heart: Regular rate and rhythm no rubs murmurs or extra sounds Lungs: Clear to auscultation, breathing unlabored, no rales or wheezes Abdomen: Positive bowel sounds, soft nontender to palpation, nondistended Extremities: No clubbing, cyanosis, or edema Skin: No evidence of breakdown, no evidence of rash  Motor:  RUE/RLE: 5/5 proximal distal  LUE/LLE: 0/5 proximal distal Increased tone noted in left upper extremity MAS 1+ to 2 on L elbow and L wrist.  , flexor withdrawal LLE   Assessment/Plan: 1. Functional deficits due to R MCA infarct  Stable for D/C today F/u PCP in 3-4 weeks F/u PM&R 2 weeks See D/C summary See D/C instructions  Uninsured - see if pt hcan get info on [place] or [place] student PT clinics Care Tool:  Bathing    Body parts bathed by patient: Left arm,Chest,Abdomen,Front perineal area,Right lower leg,Right upper leg,Left upper leg,Face,Buttocks   Body parts bathed by helper: Right arm,Left lower leg     Bathing assist Assist Level: Minimal Assistance - Patient > 75% (per most recent OT documentation)     Upper Body Dressing/Undressing Upper body dressing   What  is the patient wearing?: Pull over shirt    Upper body assist Assist Level: Minimal Assistance - Patient > 75% (per most recent OT documentation)    Lower Body Dressing/Undressing Lower body dressing      What is the patient wearing?: Pants,Incontinence brief     Lower body assist Assist for lower body dressing: Moderate Assistance - Patient 50 - 74% (per most recent OT documentation)     Toileting Toileting    Toileting assist Assist for toileting: Minimal Assistance - Patient > 75% (per most recent OT documentation)     Transfers Chair/bed transfer  Transfers assist     Chair/bed transfer assist level: Minimal Assistance - Patient > 75%     Locomotion Ambulation   Ambulation assist   Ambulation activity did not occur: Safety/medical concerns  Assist level: Maximal Assistance - Patient 25 - 49% Assistive device: No Device Max distance: 10'   Walk 10 feet activity   Assist  Walk 10 feet activity did not occur: Safety/medical concerns  Assist level: Maximal Assistance - Patient 25 - 49% Assistive device: No Device   Walk 50 feet activity   Assist Walk 50 feet with 2 turns activity did not occur: Safety/medical concerns  Assist level: Maximal Assistance - Patient 25 - 49% Assistive device: No Device    Walk 150 feet activity   Assist Walk 150 feet activity did not occur: Safety/medical concerns         Walk 10 feet on uneven surface  activity   Assist Walk 10 feet on uneven surfaces activity did not occur: Safety/medical concerns         Wheelchair     Assist Will patient use wheelchair at discharge?: Yes Type of Wheelchair: Manual    Wheelchair assist level: Supervision/Verbal cueing Max wheelchair distance: 175    Wheelchair 50 feet with 2 turns activity    Assist    Wheelchair 50 feet with 2 turns activity did not occur: Safety/medical concerns   Assist Level: Supervision/Verbal cueing   Wheelchair 150 feet  activity     Assist  Wheelchair 150 feet activity did not occur: Safety/medical concerns   Assist Level: Supervision/Verbal cueing   Blood pressure 118/70, pulse 87, temperature 97.9 F (36.6 C), temperature source Oral, resp. rate 20, height '5\' 6"'$  (1.676 m), weight 58.7 kg, SpO2 97 %. Medical Problem List and Plan: 1.  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  Impaired mobility: -DVT/anticoagulation: Conitnue SCDs             -antiplatelet therapy: Continue Aspirin 325 mg daily and Plavix 75 mg daily x3 months 3. Left shoulder pain: Tylenol as needed. Add voltaren gel and lidocaine patch.   4/17-patient reports pain is controlled-continue regimen 4. Depression: Continue Lexapro 10 mg daily             -antipsychotic agents: N/A 5. Neuropsych: This patient is capable of making decisions on his own behalf. 6. Skin/Wound Care: Routine skin checks 7. Fluids/Electrolytes/Nutrition: Routine in and outs poor intake po fluid , only 177 po in on 4/9  8.  Hypertension.  BP has been labile- continue Norvasc 10 mg daily.  Monitor with increased mobility.  Vitals:   05/21/20 1949 05/22/20 0515  BP: (!) 169/84 118/70  Pulse: 87 87  Resp: 16 20  Temp: 98.4 F (36.9 C) 97.9 F (36.6 C)  SpO2: 99% 97%  4/17 increase apresoline to '60mg'$  q8H- BP nl this am  9.  Diabetes mellitus with hyperglycemia.  Hemoglobin A1c 7.3.  SSI.  Patient on Glucophage 500 mg twice daily prior to admission.  Resume as needed CBG (last 3)  Recent Labs    05/21/20 1600 05/21/20 2101 05/22/20 0618  GLUCAP 150* 155* 126*     Elevated in pm - pt like sweet tea an dchips, discussed need to switch to diet tea  4/3- start Tradjenta 5 mg daily and monitor , with elevated pm CBGs , trial  amaryl  4/17: CBGs 101-235: increase amaryl to '3mg'$ - may take several days to monitor effect , f/u with PCP as OP  10.  Hyperlipidemia: Lipitor 11.  Polysubstance abuse.  Urine drug screen positive cocaine as well as marijuana.  ask neuropsych to provide  Counseling, pt plans to attend [facility] as OP   Tobacco abuse added nicoderm patch 37mg 12.  CKD stage III.    Creatinine baseline aroung 2.0 , cont antihypertensive meds , enc fluids   13. 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 Scheduled at 11pm as per patient's request. Will not increase during day due to grogginess this morning.  Xeomin injection to Left pec, brachiorad , biceps and hamstrings on 4/15 15.  Slow transit constipation   16.  Insomnia-  Improved- no need for restoril at home  17.  Left shoulder pain subacromial impingement, 4/13 cortisone injection left subacromial   -improved  LOS: 29 days A FACE TO FACE EVALUATION WAS PERFORMED  Charlett Blake 05/22/2020, 7:51 AM

## 2020-05-22 NOTE — Progress Notes (Addendum)
Family member Chastity called the floor regarding the patient's blood sugar & not receiving a prescription for insulin. Looked through the discharge summary, notes & MAR. On call provider was called. Was instructed to ask what her had for supper & the family member stated [employer] & sweet tea. Asked if the patient had a provider that he could go to & patient does not have a physician yet. On call instructions were given to recheck his blood sugars & if >350, take the patient to the ED for evaluation. Family member verbalized understanding & also was informed that the diet had to be controlled for the po medication to work in controlling his blood sugars.

## 2020-05-22 NOTE — Progress Notes (Signed)
Inpatient Rehabilitation Care Coordinator Discharge Note  The overall goal for the admission was met for:   Discharge location: Yes, home  Length of Stay: Yes, 29 Days  Discharge activity level: Yes, wheelchair level Min Assist  Home/community participation: Yes  Services provided included: MD, RD, PT, OT, SLP, RN, CM, TR, Pharmacy, Neuropsych and SW  Financial Services: Other: UNINSURED  Choices offered to/list presented to:pt   Follow-up services arranged: Home Health: TBD per Bertha (or additional information): PT OT ST- Per [employer], Drop Arm Commode, Transfer Bench  Patient/Family verbalized understanding of follow-up arrangements: Yes  Individual responsible for coordination of the follow-up planCarolyne Fiscal, [phone]  Confirmed correct DME delivered: Dyanne Iha 05/22/2020    Dyanne Iha

## 2020-05-22 NOTE — Progress Notes (Signed)
Patient ID: Bobby Branch, male   DOB: Mar 16, 1960, 60 y.o.   MRN: [id number]   Pt [name] referral sent to [facility] for [facility] review. SW will follow up with pt on determination  Erlene Quan, [place]

## 2020-05-22 NOTE — Progress Notes (Signed)
Patient discharged with family in a private vehicle with no issues. Noon medications administered. Patient spoke with provider; discharge instructions were given to patient and family members. All questions answered + follow up appointments disclosed and explained to patient. All belongings sent home.

## 2020-05-24 ENCOUNTER — Telehealth (HOSPITAL_COMMUNITY): Payer: Self-pay | Admitting: Pharmacist

## 2020-05-24 ENCOUNTER — Telehealth: Payer: Self-pay | Admitting: Physical Medicine & Rehabilitation

## 2020-05-24 NOTE — Telephone Encounter (Signed)
Currently home states he has no medication for Diabetes and needs something went to pharmacy has not medication states was removed from Metformin due to issue with liver.  Please contact patient/pharmacy to let them know what medication he may currently talk.

## 2020-05-24 NOTE — Telephone Encounter (Signed)
Transitional Care call--Mr Bobby Branch and his girlfriend Bobby Branch.     1. Are you/is patient experiencing any problems since coming home? Are there any questions regarding any aspect of care? NO 2. Are there any questions regarding medications administration/dosing? Are meds being taken as prescribed? Patient should review meds with caller to confirm I have reviewed medications and they have them, and he does have his glimepiride. 3. Have there been any falls? NO 4. Has Home Health been to the house and/or have they contacted you? If not, have you tried to contact them? Can we help you contact them? Charity care, no insurance TBD 5. Are bowels and bladder emptying properly? Are there any unexpected incontinence issues? If applicable, is patient following bowel/bladder programs? NO PROBLEMS 6. Any fevers, problems with breathing, unexpected pain?NO 7. Are there any skin problems or new areas of breakdown? NO 8. Has the patient/family member arranged specialty MD follow up (ie cardiology/neurology/renal/surgical/etc)?  Can we help arrange? They have appts with Dr Leonie Man, and new PCP Francis Dowse, NP, and I have given appt to see our NP Danella Sensing 9. Does the patient need any other services or support that we can help arrange? NO 10. Are caregivers following through as expected in assisting the patient? YES 11. Has the patient quit smoking, drinking alcohol, or using drugs as recommended? YES  Appointment Monday 06/04/20 '@9'$ :40 arrive by 9:20 to see Danella Sensing NP and then back to Dr Letta Pate Alerted to watch for packet from our office with directions and papers to fill out before appt. [place]

## 2020-05-25 NOTE — Telephone Encounter (Signed)
Please see more recent note. Enc opened in error.

## 2020-05-26 NOTE — Telephone Encounter (Signed)
Pharmacy Transitions of Care Follow-up Telephone Call  Date of discharge: 05/22/2020 Discharge Diagnosis:   How have you been since you were released from the hospital? Concerned about sugar, feeling fine otherwise.  Medication changes made at discharge:  - START:Clopidogrel, glimeperide  - STOPPED: metformn  - CHANGED:  Medication changes verified by the patient? Yes    Medication Accessibility:  Home Pharmacy: [facility]  Was the patient provided with refills on discharged medications? YEs  Have all prescriptions been transferred from [facility] to home pharmacy?  Is the patient able to afford medications? Yes . Notable copays: N/A . Eligible patient assistance: N/A    Medication Review:  CLOPIDOGREL (PLAVIX) Clopidogrel '75mg'$  once daily.  - Educated patient on expected duration of therapy of with clopidogrel. Advised patient that aspirin will be continued indefinitely.  - Reviewed potential DDIs with patient  - Advised patient of medications to avoid (NSAIDs, ASA)  - Educated that Tylenol (acetaminophen) will be the preferred analgesic to prevent risk of bleeding  - Emphasized importance of monitoring for signs and symptoms of bleeding (abnormal bruising, prolonged bleeding, nose bleeds, bleeding from gums, discolored urine, black tarry stools)  - Advised patient to alert all providers of anticoagulation therapy prior to starting a new medication or having a procedure    Follow-up Appointments:  PCP Hospital f/u appt confirmed? Patient was reaching out to Dr Letta Pate to discuss concerns around diabetes medications.    If their condition worsens, is the pt aware to call PCP or go to the Emergency Dept.? Yes  Final Patient Assessment: Patient reported he felt fine but was concerned with diabetes.  He reported his sugars have been elevated and he did not prefer to adhere to a diet.  He asked about insulin and metformin.  Discussed that he is now on glimeperide, how to take,  how it works.  Got patient in touch with Dr. Letta Pate to discuss insulin and blood sugar concerns.  Reviewed clopidogrel with patient as well.

## 2020-06-04 ENCOUNTER — Encounter: Payer: Federal, State, Local not specified - PPO | Attending: Registered Nurse | Admitting: Registered Nurse

## 2020-06-04 DIAGNOSIS — E1165 Type 2 diabetes mellitus with hyperglycemia: Secondary | ICD-10-CM | POA: Insufficient documentation

## 2020-06-04 DIAGNOSIS — I10 Essential (primary) hypertension: Secondary | ICD-10-CM | POA: Insufficient documentation

## 2020-06-04 DIAGNOSIS — F1910 Other psychoactive substance abuse, uncomplicated: Secondary | ICD-10-CM | POA: Insufficient documentation

## 2020-06-04 DIAGNOSIS — G8110 Spastic hemiplegia affecting unspecified side: Secondary | ICD-10-CM | POA: Insufficient documentation

## 2020-06-04 DIAGNOSIS — I63511 Cerebral infarction due to unspecified occlusion or stenosis of right middle cerebral artery: Secondary | ICD-10-CM | POA: Insufficient documentation

## 2020-06-05 ENCOUNTER — Encounter: Payer: Self-pay | Admitting: *Deleted

## 2020-06-05 ENCOUNTER — Ambulatory Visit
Admission: RE | Admit: 2020-06-05 | Discharge: 2020-06-05 | Disposition: A | Discharge status: Home / Self Care | Payer: Self-pay | Source: Ambulatory Visit | Attending: Student | Admitting: Student

## 2020-06-05 DIAGNOSIS — I63511 Cerebral infarction due to unspecified occlusion or stenosis of right middle cerebral artery: Secondary | ICD-10-CM

## 2020-06-05 HISTORY — PX: IR RADIOLOGIST EVAL & MGMT: IMG5224

## 2020-06-05 NOTE — Consult Note (Signed)
Chief Complaint: Right ICA stenosis, prior stroke  Referring Physician(s): Louk,Alexandra M  History of Present Illness: Bobby Branch is a 60 y.o. 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   Bobby Branch is here today for follow up and to discuss carotid stenosis.  He is here today with his girlfriend/significant other for the appointment.    He was treated 3/18 with mechanical thrombectomy for tandem occlusion of the right MCA and right ICA.  A PTA of the right ICA occlusion was necessary, but we did not stent given the subsequent need for anti-platelet in the setting of a subarachnoid hemorrhage.    He was admitted to the ICU then discharged to inpt rehab assignment on 04/23/20.  He tells me that this went fine, and he was discharged from rehab recently.  DC summary is 05/22/20 in Epic.  He previously was living alone, not married, and was handling all of his affairs.  He has no children.   After his DC, he now has needs for his care, and his GF helps him out.  He tells me that he is able to stand alone, and do some transferring alone, but the residual left UE and LE symptoms inhibit his ability to perform his ADL's.  He requires help bathing and dressing.  He was able to use the toilet by himself "the other day".  He does express some frustrations with his needs and left sided weakness.  He is able to move about with a rolling walker.  He remains continent.  I estimate his mRS is 4.    He is a long time smoker and marijuana user, rolling his own marijuana cigarettes.  He also tells me that he used cocaine as well, but tells me he has committed to quitting.    The cerebral angiogram images confirm residual right ICA stenosis after PTA of ~40%, and left ICA stenosis of 80% or greater.   Past Medical History:  Diagnosis Date  . Stroke [facility]) 09/2019    Past  Surgical History:  Procedure Laterality Date  . IR ANGIO EXTRACRAN SEL COM CAROTID INNOMINATE UNI L MOD SED  04/20/2020  . IR CT HEAD LTD  04/20/2020  . IR PERCUTANEOUS ART THROMBECTOMY/INFUSION INTRACRANIAL INC DIAG ANGIO  04/20/2020  . IR RADIOLOGIST EVAL & MGMT  06/05/2020  . IR US GUIDE VASC ACCESS RIGHT  04/20/2020  . RADIOLOGY WITH ANESTHESIA N/A 04/20/2020   Procedure: IR WITH ANESTHESIA;  Surgeon: Luanne Bras, MD;  Location: [place];  Service: Radiology;  Laterality: N/A;    Allergies: Patient has no known allergies.  Medications: Prior to Admission medications   Medication Sig Start Date End Date Taking? Authorizing Provider  acetaminophen (TYLENOL) 325 MG tablet Take 2 tablets (650 mg total) by mouth every 4 (four) hours as needed for mild pain (or temp > 37.5 C (99.5 F)). 05/21/20   Angiulli, Lavon Paganini, PA-C  albuterol (VENTOLIN HFA) 108 (90 Base) MCG/ACT inhaler Inhale 1 puff into the lungs every 6 (six) hours as needed for wheezing or shortness of breath. 05/21/20   Angiulli, Lavon Paganini, PA-C  amLODipine (NORVASC) 10 MG tablet Take 1 tablet (10 mg total) by mouth daily. 05/21/20   Angiulli, Lavon Paganini, PA-C  aspirin EC 325 MG EC tablet Take 1 tablet (325 mg total) by mouth daily. 05/22/20   Angiulli, Lavon Paganini, PA-C  atorvastatin (LIPITOR) 40 MG tablet Take 1 tablet (40 mg total) by mouth daily. 05/21/20   Angiulli, Lavon Paganini, PA-C  clopidogrel (PLAVIX) 75 MG tablet Take 1 tablet (75 mg total) by mouth daily. 05/21/20   Angiulli, Lavon Paganini, PA-C  diclofenac Sodium (VOLTAREN) 1 % GEL Apply 2 g topically 4 (four) times daily. 05/21/20   Angiulli, Lavon Paganini, PA-C  docusate sodium (COLACE) 100 MG capsule Take 1 capsule (100 mg total) by mouth 2 (two) times daily. 05/21/20   Angiulli, Lavon Paganini, PA-C  escitalopram (LEXAPRO) 10 MG tablet Take 1 tablet (10 mg total) by mouth daily. 05/21/20   Angiulli, Lavon Paganini, PA-C  famotidine (PEPCID) 20 MG tablet Take 1 tablet (20 mg total) by mouth daily. 05/21/20    Angiulli, Lavon Paganini, PA-C  glimepiride (AMARYL) 2 MG tablet Take 1.5 tablets (3 mg total) by mouth daily with breakfast. 05/21/20   Angiulli, Lavon Paganini, PA-C  hydrALAZINE (APRESOLINE) 50 MG tablet Take 1 tablet (50 mg total) by mouth 3 (three) times daily. 05/21/20   Kirsteins, Luanna Salk, MD  lidocaine (LIDODERM) 5 % Place 1 patch onto the skin daily. Remove & Discard patch within 12 hours or as directed by MD 05/21/20   Angiulli, Lavon Paganini, PA-C  Multiple Vitamin (MULTIVITAMIN WITH MINERALS) TABS tablet Take 1 tablet by mouth daily. 05/22/20   Angiulli, Lavon Paganini, PA-C  nicotine (NICODERM CQ - DOSED IN MG/24 HR) 7 mg/24hr patch Place 1 patch (7 mg total) onto the skin daily. 05/22/20   Angiulli, Lavon Paganini, PA-C  polyethylene glycol (MIRALAX / GLYCOLAX) 17 g packet Take 17 g by mouth daily. 05/22/20   Angiulli, Lavon Paganini, PA-C  tiZANidine (ZANAFLEX) 2 MG tablet Take 1 tablet (2 mg total) by mouth 3 (three) times daily. 05/21/20   Angiulli, Lavon Paganini, PA-C     No family history on file.  Social History   Socioeconomic History  . Marital status: Single    Spouse name: Not on file  . Number of children: Not on file  . Years of education: Not on file  . Highest education level: Not on file  Occupational History  . Not on file  Tobacco Use  . Smoking status: Current Every Day Smoker    Types: Cigarettes  . Smokeless tobacco: Never Used  Substance and Sexual Activity  . Alcohol use: Yes  . Drug use: Yes    Types: Cocaine, Marijuana  . Sexual activity: Yes    Partners: Female  Other Topics Concern  . Not on file  Social History Narrative  . Not on file   Social Determinants of Health   Financial Resource Strain: Not on file  Food Insecurity: Not on file  Transportation Needs: Not on file  Physical Activity: Not on file  Stress: Not on file  Social Connections: Not on file       Review of Systems: A 12 point ROS discussed and pertinent positives are indicated in the HPI above.  All other  systems are negative.  Review of Systems  Vital Signs: There were no vitals taken for this visit.  Physical Exam General: 60 yo male appearing older than stated age.  Well-developed, well-nourished.  No distress. HEENT: Atraumatic, normocephalic.  Conjugate gaze, extra-ocular motor intact. No scleral icterus or scleral injection. No lesions on external ears, nose, lips, or gums.  Oral mucosa moist, pink.  Poor dentition.  Neck: Symmetric with no goiter enlargement.  Chest/Lungs:  Symmetric chest with inspiration/expiration.  No labored  breathing.  Clear to auscultation with no wheezes, rhonchi, or rales.  Heart:  RRR, with no third heart sounds appreciated. No JVD appreciated.  Abdomen:  Soft, NT/ND, with + bowel sounds.   Genito-urinary: Deferred Neurologic: Alert & Oriented to person, place, and time.   Normal affect and insight.  Appropriate questions.   CN's 2 -10,12 intact.  The left shoulder shrug is weakened.  Symmetric head turning ability.  RUE 5/5 strength of shoulder, elbow, wrist LUE 1/5 strength of shoulder, elbow, wrist RLE: 5/5 strength of hip, knee, ankle LLE: 0/5 strength of hip, knee, ankle Pulse Exam:  Bilateral neck bruit appreciated.  Palpable radial pulses.      Imaging: DG Shoulder 1V Left  Result Date: 05/15/2020 CLINICAL DATA:  Left shoulder pain, recent stroke 3 weeks ago EXAM: LEFT SHOULDER COMPARISON:  None. FINDINGS: Limited single view exam. No gross malalignment. Negative for fracture. AC joint aligned. Included left chest unremarkable. IMPRESSION: No definite acute finding by plain radiography Electronically Signed   By: Jerilynn Mages.  Shick M.D.   On: 05/15/2020 08:49   IR Radiologist Eval & Mgmt  Result Date: 06/05/2020 Please refer to notes tab for details about interventional procedure. (Op Note)   Labs:  CBC: Recent Labs    04/22/20 0254 04/23/20 0345 04/24/20 0504 05/07/20 0656  WBC 11.6* 9.2 7.1 7.9  HGB 12.4* 12.4* 12.7* 12.4*  HCT 35.8* 37.0*  37.3* 36.4*  PLT 324 311 306 366    COAGS: Recent Labs    04/20/20 0011  INR 0.9  APTT 32    BMP: Recent Labs    05/03/20 0456 05/07/20 0656 05/13/20 0854 05/17/20 0512  NA 140 136 136 135  K 4.3 3.7 3.8 4.6  CL 102 105 104 104  CO2 33* '25 25 24  '$ GLUCOSE 175* 125* 227* 153*  BUN 26* 19 27* 33*  CALCIUM 8.9 8.7* 8.8* 9.4  CREATININE 2.43* 1.86* 1.96* 2.13*  GFRNONAA 30* 42* 39* 35*    LIVER FUNCTION TESTS: Recent Labs    04/20/20 0011 04/20/20 0500 04/24/20 0504  BILITOT 0.7 0.6 0.5  AST 27 34 26  ALT '18 23 15  '$ ALKPHOS 67 68 60  PROT 5.9* 5.4* 5.4*  ALBUMIN 2.6* 2.5* 2.1*    TUMOR MARKERS: No results for input(s): AFPTM, CEA, CA199, CHROMGRNA in the last 8760 hours.    Assessment & Plan:    Bobby Branch is a very pleasant 60 year-old AA gentleman with symptomatic extracranial carotid arterial disease, attributable to the right ICA, SP recent acute large vessel stroke requiring revascularization of the ICA and MCA territory.   The index event occurred 04/20/20, with acute right MCA syndrome, stroke and incomplete resolution. Current mRS of 4.  During mechanical thrombectomy, the critical stenosis of the right ICA could not be treated definitively with stenting, as that would have required dual anti-platelet meds in the setting of subarachnoid hemorrhage.  We settled for a PTA of the carotid, and interval stenting, which we are discussing today.    I had discussion with Bobby Branch and his girlfriend regarding the definition and epidemiology of stroke/TIA, associated cardiovascular risk factors, pathology/pathophysiology, the natural history of extracranial carotid arterial disease/risk of recurrent TIA/stroke, and the goals of therapy in this setting. Specifically, reducing risk of recurrent stroke/disability.   We discussed the risk of recurrent stroke/TIA after the initial event, accepted to be higher in the first 90 days.  The risk may be between up to ~20%,  acknowledging that Intensive  Medical Management (IMM) therapy has improved over time (SAMMPRIS trial, Rationale of CREST-2 IMM) and the risk is probably on the lower end of this spectrum, dependent on adherence to IMM and other individualized factors.    Specifically for him, I do suspect his risk is higher, given his high grade stenosis as well of the contra-lateral left ICA, ~80% on cerebral angiogram. I shared this with him.    In addition to the discussion with traditional risk factors, I also informed him of recent evidence published by the [facility] regarding the risk of recurrent stroke in habitual marijuana users, with RR of ~50%.  I did encourage him to quite both smoking and marijuana smoking.   Recommendations from American Heart Association (AHA Guidelines, Kleindorfer) and other multi-disciplinary guidelines were emphasized, including: Lifestyle modification (reduce sodium, physical activity/weight reduction, limit ETOH/illicit substances), Treatment of hypertension (targeting =/< 130-140/90); High-intensity statin therapy (targeting LDL < 100 (optimally < 70); Anti-platelet therapy; Tobacco cessation; blood-glucose control (targeting hgb A1C <7.0).      I did share my impression with Bobby Branch that he is a candidate for revascularization of the symptomatic right ICA based on history and angiogram, with the ultimate goal of minimizing peri-procedural/peri-operative stroke to achieve lowest possible risk/co-morbidity long-term.   Given his contra-lateral stenosis, we want to make sure that we optimize his blood flow, despite his residual symptoms and mRS of 4. He agrees.    After our discussion, he would like to proceed with treatment of the right ICA stenosis with stenting.  I did let him know that we would address the left side after treatment of the right.   Plan: -       Continue intensive medical management, as above -       Plan for cervical/cerebral angiogram and stenting of the right  cervical ICA with embolic protection, given his high grade, symptomatic stenosis and history of stroke.  With Dr. Earleen Newport at [facility].  -       Regarding intensive medical management, I discussed with him the need for smoking cessation as well as cessation of marijuana use, as above.    - Plan to address the high grade contralateral ICA stenosis once we treat the symptomatic side.    1 -  Rationale for IMM - Bobby Branch. PMID: [id number] 2 - AHA Guidelines, Kleindorfer DO, et al. PMID: [id number] 3 - CREST Long-Term Brott TG, et al. PMID: [id number] 4 - ICSS, Bonati elt al. PMID: [id number] 5 - ACT -1, Rosenfield et al. PMID: PMID: [id number]  Electronically Signed: Corrie Mckusick 06/05/2020, 4:10 PM   I spent a total of  60 Minutes   in face to face in clinical consultation, greater than 50% of which was counseling/coordinating care for high grade right ICA stenosis, symptomatic, possible cervical angiogram and possible stenting with embolic protection.

## 2020-06-15 ENCOUNTER — Other Ambulatory Visit (HOSPITAL_COMMUNITY): Payer: Self-pay | Admitting: Interventional Radiology

## 2020-06-15 DIAGNOSIS — I771 Stricture of artery: Secondary | ICD-10-CM

## 2020-06-19 ENCOUNTER — Other Ambulatory Visit: Payer: Self-pay

## 2020-06-19 ENCOUNTER — Other Ambulatory Visit (HOSPITAL_COMMUNITY)
Admission: RE | Admit: 2020-06-19 | Discharge: 2020-06-19 | Disposition: A | Discharge status: Home / Self Care | Payer: Medicaid Other | Source: Ambulatory Visit | Attending: Interventional Radiology | Admitting: Interventional Radiology

## 2020-06-19 ENCOUNTER — Other Ambulatory Visit: Payer: Self-pay | Admitting: Radiology

## 2020-06-19 ENCOUNTER — Telehealth: Payer: Self-pay | Admitting: Podiatry

## 2020-06-19 DIAGNOSIS — Z20822 Contact with and (suspected) exposure to covid-19: Secondary | ICD-10-CM | POA: Insufficient documentation

## 2020-06-19 DIAGNOSIS — Z01812 Encounter for preprocedural laboratory examination: Secondary | ICD-10-CM | POA: Diagnosis not present

## 2020-06-19 LAB — SARS CORONAVIRUS 2 (TAT 6-24 HRS): SARS Coronavirus 2: NEGATIVE

## 2020-06-19 NOTE — Telephone Encounter (Signed)
Bobby Branch- I believe this patient is trying to contact Dr. Leonie Douglas with Interventional Radiology. Please have them call his office. Thanks.

## 2020-06-19 NOTE — Telephone Encounter (Signed)
Patient called regarding recent order for Stent, stated they need clarification regarding the day of appointment and has requested return call [phone], Please Advise

## 2020-06-20 ENCOUNTER — Encounter (HOSPITAL_COMMUNITY): Payer: Self-pay | Admitting: Interventional Radiology

## 2020-06-20 ENCOUNTER — Other Ambulatory Visit: Payer: Self-pay | Admitting: Radiology

## 2020-06-20 ENCOUNTER — Other Ambulatory Visit: Payer: Self-pay | Admitting: Student

## 2020-06-20 ENCOUNTER — Telehealth (HOSPITAL_COMMUNITY): Payer: Self-pay

## 2020-06-20 ENCOUNTER — Other Ambulatory Visit: Payer: Self-pay

## 2020-06-20 NOTE — Progress Notes (Signed)
Patient denies shortness of breath, fever, cough or chest pain.  PCP - None Cardiologist - n/a  Chest x-ray - 05/01/20 (2V) EKG - 04/20/20 Stress Test - n/a ECHO - 04/20/20 Cardiac Cath - n/a  Fasting Blood Sugar - 110s-120s Checks Blood Sugar 2 times a day  . Do not take glimepiride on the morning of surgery.  . If your blood sugar is less than 70 mg/dL, you will need to treat for low blood sugar: o Treat a low blood sugar (less than 70 mg/dL) with  cup of clear juice (cranberry or apple), 4 glucose tablets, OR glucose gel. o Recheck blood sugar in 15 minutes after treatment (to make sure it is greater than 70 mg/dL). If your blood sugar is not greater than 70 mg/dL on recheck, call [phone] for further instructions.  Anesthesia review: Yes  STOP now taking any Aspirin (unless otherwise instructed by your surgeon), Aleve, Naproxen, Ibuprofen, Motrin, Advil, Goody's, BC's, all herbal medications, fish oil, and all vitamins.   Coronavirus Screening Covid test on 06/19/20 was negative.  Patient verbalized understanding of instructions that were given via phone.

## 2020-06-20 NOTE — Progress Notes (Signed)
Anesthesia Chart Review: Same day workup  PMH significant for HTN, HLD, DM2, prior stroke in august 2021 with very mild residual deficit who presented to [facility] 04/20/20 with Right MCA stroke with right M2/3 occlusion and right ICA occlusion. Underwent TPAandthrombectomy by IR.  Stent was not placed at that time as that would have required antiplatelet medication in the setting of subarachnoid hemorrhage.  Echo showed EF 55-60%. UDS positive for THC and cocaine.  He did complete inpatient rehab.  He was ultimately discharged on Plavix and aspirin to follow-up with outpatient with Dr. Earleen Newport for consideration of right ICA stenting.  Patient seen by Dr. Earleen Newport 06/05/2020.  Per note, 3 1 angiogram images confirmed residual right ICA stenosis after PTA of ~40%, left ICA stenosis of 80% or greater.  He was recommended he undergo stenting of the right ICA stenosis.  Left ICA stenosis to be addressed after treatment of the right.  History of CKD 3, baseline creatinine appears to be around 2.0.  DM2, last A1c 7.3 on 04/20/2020.  Will need DOS labs and eval.   EKG 04/20/2020: Sinus tachycardia.  Rate 100. Anterior infarct, old. Nonspecific T abnormalities, lateral leads  Carotid duplex 04/21/20: Summary:  Right Carotid: Velocities in the right ICA are consistent with a 60-79%  stenosis.  Left Carotid: Velocities in the left ICA are consistent with a 80-99% stenosis.  Vertebrals: Bilateral vertebral arteries demonstrate antegrade flow.  Subclavians: Normal flow hemodynamics were seen in bilateral subclavian  arteries.   TTE 04/20/20: 1. Technically suboptima saline microcavitation study; no obvious shunt  but cannot completely exclude with this study.  2. Left ventricular ejection fraction, by estimation, is 55 to 60%. The  left ventricle has normal function. The left ventricle has no regional  wall motion abnormalities. There is mild left ventricular hypertrophy.  Left ventricular diastolic parameters   are consistent with Grade I diastolic dysfunction (impaired relaxation).  3. Right ventricular systolic function is normal. The right ventricular  size is normal.  4. The mitral valve is normal in structure. No evidence of mitral valve  regurgitation. No evidence of mitral stenosis.  5. The aortic valve is tricuspid. Aortic valve regurgitation is not  visualized. No aortic stenosis is present.  6. The inferior vena cava is normal in size with greater than 50%  respiratory variability, suggesting right atrial pressure of 3 mmHg.    [facility] Short Stay Center/Anesthesiology Phone [phone] 06/20/2020 11:45 AM

## 2020-06-20 NOTE — Anesthesia Preprocedure Evaluation (Addendum)
Anesthesia Evaluation  Patient identified by MRN, date of birth, ID band Patient awake    Reviewed: Allergy & Precautions, H&P , NPO status , Patient's Chart, lab work & pertinent test results  Airway Mallampati: II  TM Distance: >3 FB Neck ROM: Full    Dental no notable dental hx. (+) Teeth Intact, Dental Advisory Given   Pulmonary Current Smoker and Patient abstained from smoking.,    Pulmonary exam normal breath sounds clear to auscultation       Cardiovascular Exercise Tolerance: Good hypertension, Pt. on medications  Rhythm:Regular Rate:Normal     Neuro/Psych Anxiety Depression CVA, Residual Symptoms    GI/Hepatic Neg liver ROS, GERD  Medicated,  Endo/Other  diabetes, Type 2, Oral Hypoglycemic Agents  Renal/GU Renal InsufficiencyRenal disease  negative genitourinary   Musculoskeletal   Abdominal   Peds  Hematology negative hematology ROS (+)   Anesthesia Other Findings   Reproductive/Obstetrics negative OB ROS                           Anesthesia Physical Anesthesia Plan  ASA: III  Anesthesia Plan: General   Post-op Pain Management:    Induction: Intravenous  PONV Risk Score and Plan: 2 and Ondansetron and Dexamethasone  Airway Management Planned: Oral ETT  Additional Equipment:   Intra-op Plan:   Post-operative Plan: Extubation in OR  Informed Consent: I have reviewed the patients History and Physical, chart, labs and discussed the procedure including the risks, benefits and alternatives for the proposed anesthesia with the patient or authorized representative who has indicated his/her understanding and acceptance.     Dental advisory given  Plan Discussed with: CRNA  Anesthesia Plan Comments: (PAT note by Karoline Caldwell, PA-C: PMH significant for HTN, HLD, DM2, prior stroke in august 2021 with very mild residual deficit who presented to [facility] 04/20/20 with Right MCA  stroke with right M2/3 occlusion and right ICA occlusion. Underwent TPAandthrombectomy by IR.  Stent was not placed at that time as that would have required antiplatelet medication in the setting of subarachnoid hemorrhage.  Echo showed EF 55-60%. UDS positive for THC and cocaine.  He did complete inpatient rehab.  He was ultimately discharged on Plavix and aspirin to follow-up with outpatient with Dr. Earleen Newport for consideration of right ICA stenting.  Patient seen by Dr. Earleen Newport 06/05/2020.  Per note, 3 1 angiogram images confirmed residual right ICA stenosis after PTA of ~40%, left ICA stenosis of 80% or greater.  He was recommended he undergo stenting of the right ICA stenosis.  Left ICA stenosis to be addressed after treatment of the right.  History of CKD 3, baseline creatinine appears to be around 2.0.  DM2, last A1c 7.3 on 04/20/2020.  Will need DOS labs and eval.   EKG 04/20/2020: Sinus tachycardia.  Rate 100. Anterior infarct, old. Nonspecific T abnormalities, lateral leads  Carotid duplex 04/21/20: Summary:  Right Carotid: Velocities in the right ICA are consistent with a 60-79%  stenosis.  Left Carotid: Velocities in the left ICA are consistent with a 80-99% stenosis.  Vertebrals: Bilateral vertebral arteries demonstrate antegrade flow.  Subclavians: Normal flow hemodynamics were seen in bilateral subclavian  arteries.   TTE 04/20/20: 1. Technically suboptima saline microcavitation study; no obvious shunt  but cannot completely exclude with this study.  2. Left ventricular ejection fraction, by estimation, is 55 to 60%. The  left ventricle has normal function. The left ventricle has no regional  wall motion abnormalities.  There is mild left ventricular hypertrophy.  Left ventricular diastolic parameters  are consistent with Grade I diastolic dysfunction (impaired relaxation).  3. Right ventricular systolic function is normal. The right ventricular  size is normal.  4. The mitral  valve is normal in structure. No evidence of mitral valve  regurgitation. No evidence of mitral stenosis.  5. The aortic valve is tricuspid. Aortic valve regurgitation is not  visualized. No aortic stenosis is present.  6. The inferior vena cava is normal in size with greater than 50%  respiratory variability, suggesting right atrial pressure of 3 mmHg.  )      Anesthesia Quick Evaluation

## 2020-06-20 NOTE — Telephone Encounter (Signed)
Patient was confused, kept stating [place] but I did inform them to contact Dr. Bryna Colander office for more info. Thanks

## 2020-06-20 NOTE — H&P (Signed)
Chief Complaint: Patient was seen in consultation today for right ICA stenosis.  Supervising Physician: Corrie Mckusick  Patient Status: [facility] - Out-pt  History of Present Illness: Bobby Branch is a 60 y.o. male with a past medical history significant for depression, tobacco use, marijuana use, GERD, CKD, DM, HLD, HTN, CVA and right ICA stenosis who presents today for image guided cervical/cerebral angiogram and possible right ICA stent placement. Bobby Branch presented to [facility] on 04/20/20 as a code stroke with complaints of left-side weakness and facial droop, Bobby Branch was found to have a right MCA M2/M3 occlusion as well as right ICA occlusion proximally with reconstitution intracranially and left ICA string sign at the bifurcation. Bobby Branch underwent mechanical thrombectomy with Dr. Earleen Newport that same day for the tandem occlusion of the right MCA and right ICA as well as a PTA of the right ICA occlusion, but no stent was placed due to the need for anti-platelet in the setting of [place]. Bobby Branch was then admitted to the ICU until 04/23/20 when Bobby Branch was discharged to inpatient rehab. Bobby Branch was ultimately discharged to home on 05/22/20 with instructions to continue Plavix 75 mg QD and ASA 325 mg QD x 3 months per neurology. Bobby Branch was seen in follow up with Dr. Earleen Newport on 06/05/20 to discuss revascularization of the right ICA stenosis and deemed to be a candidate for the procedure for which Bobby Branch presents today.  Bobby Branch denies any new neurologic complaints, Bobby Branch has a rash on his lower extremities that appeared in the last couple days - Bobby Branch has not seen his PCP or anyone else about it. Bobby Branch has had some success cutting down on cigarette smoking but has not been able to quit completely yet (smoking 5-6 cigarettes QD currently). Bobby Branch has quit using marijuana completely. Bobby Branch is planned to see PM&R in June. Bobby Branch has been taking his medications as prescribed. Bobby Branch understands the procedure today, including plan for overnight admission, and is agreeable to  proceed as planned.  Past Medical History:  Diagnosis Date  . Depression   . Diabetes mellitus without complication ([place])   . Dyspnea    inhaler  . GERD (gastroesophageal reflux disease)   . HLD (hyperlipidemia)   . Hypertension   . Stroke [facility]) 09/2019    Past Surgical History:  Procedure Laterality Date  . IR ANGIO EXTRACRAN SEL COM CAROTID INNOMINATE UNI L MOD SED  04/20/2020  . IR CT HEAD LTD  04/20/2020  . IR PERCUTANEOUS ART THROMBECTOMY/INFUSION INTRACRANIAL INC DIAG ANGIO  04/20/2020  . IR RADIOLOGIST EVAL & MGMT  06/05/2020  . IR US GUIDE VASC ACCESS RIGHT  04/20/2020  . RADIOLOGY WITH ANESTHESIA N/A 04/20/2020   Procedure: IR WITH ANESTHESIA;  Surgeon: Luanne Bras, MD;  Location: [place];  Service: Radiology;  Laterality: N/A;    Allergies: Patient has no known allergies.  Medications: Prior to Admission medications   Medication Sig Start Date End Date Taking? Authorizing Provider  acetaminophen (TYLENOL) 325 MG tablet Take 2 tablets (650 mg total) by mouth every 4 (four) hours as needed for mild pain (or temp > 37.5 C (99.5 F)). 05/21/20   Angiulli, Lavon Paganini, PA-C  albuterol (VENTOLIN HFA) 108 (90 Base) MCG/ACT inhaler Inhale 1 puff into the lungs every 6 (six) hours as needed for wheezing or shortness of breath. 05/21/20   Angiulli, Lavon Paganini, PA-C  amLODipine (NORVASC) 10 MG tablet Take 1 tablet (10 mg total) by mouth daily. 05/21/20   Angiulli, Lavon Paganini, PA-C  aspirin  EC 325 MG EC tablet Take 1 tablet (325 mg total) by mouth daily. 05/22/20   Angiulli, Lavon Paganini, PA-C  atorvastatin (LIPITOR) 40 MG tablet Take 1 tablet (40 mg total) by mouth daily. 05/21/20   Angiulli, Lavon Paganini, PA-C  clopidogrel (PLAVIX) 75 MG tablet Take 1 tablet (75 mg total) by mouth daily. 05/21/20   Angiulli, Lavon Paganini, PA-C  diclofenac Sodium (VOLTAREN) 1 % GEL Apply 2 g topically 4 (four) times daily. 05/21/20   Angiulli, Lavon Paganini, PA-C  docusate sodium (COLACE) 100 MG capsule Take 1 capsule (100 mg  total) by mouth 2 (two) times daily. 05/21/20   Angiulli, Lavon Paganini, PA-C  escitalopram (LEXAPRO) 10 MG tablet Take 1 tablet (10 mg total) by mouth daily. 05/21/20   Angiulli, Lavon Paganini, PA-C  famotidine (PEPCID) 20 MG tablet Take 1 tablet (20 mg total) by mouth daily. 05/21/20   Angiulli, Lavon Paganini, PA-C  glimepiride (AMARYL) 2 MG tablet Take 1.5 tablets (3 mg total) by mouth daily with breakfast. 05/21/20   Angiulli, Lavon Paganini, PA-C  hydrALAZINE (APRESOLINE) 50 MG tablet Take 1 tablet (50 mg total) by mouth 3 (three) times daily. 05/21/20   Kirsteins, Luanna Salk, MD  lidocaine (LIDODERM) 5 % Place 1 patch onto the skin daily. Remove & Discard patch within 12 hours or as directed by MD 05/21/20   Angiulli, Lavon Paganini, PA-C  Multiple Vitamin (MULTIVITAMIN WITH MINERALS) TABS tablet Take 1 tablet by mouth daily. 05/22/20   Angiulli, Lavon Paganini, PA-C  nicotine (NICODERM CQ - DOSED IN MG/24 HR) 7 mg/24hr patch Place 1 patch (7 mg total) onto the skin daily. 05/22/20   Angiulli, Lavon Paganini, PA-C  polyethylene glycol (MIRALAX / GLYCOLAX) 17 g packet Take 17 g by mouth daily. 05/22/20   Angiulli, Lavon Paganini, PA-C  tiZANidine (ZANAFLEX) 2 MG tablet Take 1 tablet (2 mg total) by mouth 3 (three) times daily. 05/21/20   Angiulli, Lavon Paganini, PA-C     No family history on file.  Social History   Socioeconomic History  . Marital status: Single    Spouse name: Not on file  . Number of children: Not on file  . Years of education: Not on file  . Highest education level: Not on file  Occupational History  . Not on file  Tobacco Use  . Smoking status: Current Every Day Smoker    Types: Cigarettes  . Smokeless tobacco: Never Used  Substance and Sexual Activity  . Alcohol use: Yes  . Drug use: Yes    Types: Cocaine, Marijuana  . Sexual activity: Yes    Partners: Female  Other Topics Concern  . Not on file  Social History Narrative  . Not on file   Social Determinants of Health   Financial Resource Strain: Not on file   Food Insecurity: Not on file  Transportation Needs: Not on file  Physical Activity: Not on file  Stress: Not on file  Social Connections: Not on file     Review of Systems: A 12 point ROS discussed and pertinent positives are indicated in the HPI above.  All other systems are negative.  Review of Systems  Constitutional: Negative for chills and fever.  Respiratory: Negative for cough and shortness of breath.   Cardiovascular: Negative for chest pain.  Gastrointestinal: Negative for abdominal pain, diarrhea, nausea and vomiting.  Musculoskeletal: Negative for back pain.  Skin: Positive for rash (lower extremities).  Neurological: Positive for facial asymmetry (since stroke) and weakness (left side).  Negative for dizziness, seizures, speech difficulty, numbness and headaches.  Psychiatric/Behavioral: Negative for confusion.    Vital Signs: There were no vitals taken for this visit.  Physical Exam Vitals reviewed.  Constitutional:      General: Bobby Branch is not in acute distress. HENT:     Head: Normocephalic.     Mouth/Throat:     Mouth: Mucous membranes are moist.     Pharynx: Oropharynx is clear. No oropharyngeal exudate or posterior oropharyngeal erythema.  Eyes:     General: No scleral icterus. Cardiovascular:     Rate and Rhythm: Normal rate and regular rhythm.     Pulses: Normal pulses.     Comments: Strong palpable DP/PT pulses bilaterally Right CFA site marked per Dr. Earleen Newport Pulmonary:     Effort: Pulmonary effort is normal.     Breath sounds: Normal breath sounds. No wheezing or rales.  Abdominal:     General: There is no distension.     Palpations: Abdomen is soft.     Tenderness: There is no abdominal tenderness.  Musculoskeletal:     Comments: Contracted LUE - unable to straighten unless yawning per patient  Skin:    General: Skin is warm and dry.     Coloration: Skin is not jaundiced.  Neurological:     Mental Status: Bobby Branch is alert.   Alert, awake, and  oriented x 3 Speech and comprehension in tact PER bilaterally EOMs without nystagmus or subjective diplopia. Visual fields grossly in tact Positive facial asymmetry - left side facial droop. Tongue midline. Motor power 5/5 RUE and RLE, 1/5 LUE with diminished shoulder shrug, 3/5 LLE Pronator drift not assessed. Fine motor and coordination grossly in tact Gait not assessed Romberg not assessed Heel to toe not assessed Distal pulses palpable bilaterally    MD Evaluation Airway: WNL Heart: WNL Abdomen: WNL Chest/ Lungs: WNL ASA  Classification: Per MD or Designee Mallampati/Airway Score:  (per anesthesia)   Imaging: IR Radiologist Eval & Mgmt  Result Date: 06/05/2020 Please refer to notes tab for details about interventional procedure. (Op Note)   Labs:  CBC: Recent Labs    04/22/20 0254 04/23/20 0345 04/24/20 0504 05/07/20 0656  WBC 11.6* 9.2 7.1 7.9  HGB 12.4* 12.4* 12.7* 12.4*  HCT 35.8* 37.0* 37.3* 36.4*  PLT 324 311 306 366    COAGS: Recent Labs    04/20/20 0011  INR 0.9  APTT 32    BMP: Recent Labs    05/03/20 0456 05/07/20 0656 05/13/20 0854 05/17/20 0512  NA 140 136 136 135  K 4.3 3.7 3.8 4.6  CL 102 105 104 104  CO2 33* '25 25 24  '$ GLUCOSE 175* 125* 227* 153*  BUN 26* 19 27* 33*  CALCIUM 8.9 8.7* 8.8* 9.4  CREATININE 2.43* 1.86* 1.96* 2.13*  GFRNONAA 30* 42* 39* 35*    LIVER FUNCTION TESTS: Recent Labs    04/20/20 0011 04/20/20 0500 04/24/20 0504  BILITOT 0.7 0.6 0.5  AST 27 34 26  ALT '18 23 15  '$ ALKPHOS 67 68 60  PROT 5.9* 5.4* 5.4*  ALBUMIN 2.6* 2.5* 2.1*    TUMOR MARKERS: No results for input(s): AFPTM, CEA, CA199, CHROMGRNA in the last 8760 hours.  Assessment and Plan:  60 y/o M who presented to [facility] ED on 04/20/20 as a code stroke with left sided weakness and facial droop, Bobby Branch was found to have a tandem occlusion of the right MCA and right ICA which required mechanical thrombectomy as well as PTA  of the right ICA  occlusion, a stent was not placed at that time due to need for anti-platelet medication in the setting of [place]. Bobby Branch presents today for a cervical/cerebral angiogram with possible right ICA stent placement as previously discussed with Dr. Earleen Newport on 06/05/20.  Patient has been NPO since midnight, currently taking Plavix 75 mg QD and ASA 325 mg QD. Afebrile, WBC 7.7, hgb 12.6, plt 321, creatinine 2.56 (baseline renal disease), INR 0.9, COVID (-) 5/17. Bobby Branch understands the procedure will be performed using general anesthesia with overnight admission for observation.  Risks and benefits of cervical/cerebral arteriogram with intervention were discussed with the patient including, but not limited to bleeding, infection, vascular injury, contrast induced renal failure, stroke, reperfusion hemorrhage, or even death.  This interventional procedure involves the use of X-rays and because of the nature of the planned procedure, it is possible that we will have prolonged use of X-ray fluoroscopy. Potential radiation risks to you include (but are not limited to) the following: - A slightly elevated risk for cancer  several years later in life. This risk is typically less than 0.5% percent. This risk is low in comparison to the normal incidence of human cancer, which is 33% for women and 50% for men according to the [place]. - Radiation induced injury can include skin redness, resembling a rash, tissue breakdown / ulcers and hair loss (which can be temporary or permanent).  The likelihood of either of these occurring depends on the difficulty of the procedure and whether you are sensitive to radiation due to previous procedures, disease, or genetic conditions.  IF your procedure requires a prolonged use of radiation, you will be notified and given written instructions for further action.  It is your responsibility to monitor the irradiated area for the 2 weeks following the procedure and to notify your  physician if you are concerned that you have suffered a radiation induced injury.    All of the patient's questions were answered, patient is agreeable to proceed.  Consent signed and in chart.  Thank you for this interesting consult.  I greatly enjoyed meeting Bobby Branch and look forward to participating in their care.  A copy of this report was sent to the requesting provider on this date.  Electronically Signed: Joaquim Nam, PA-C 06/20/2020, 10:10 AM   I spent a total of  25 Minutes in face to face in clinical consultation, greater than 50% of which was counseling/coordinating care for cervical/cerebral angiogram with possible right ICA stent placement.

## 2020-06-20 NOTE — H&P (Deleted)
  The note originally documented on this encounter has been moved the the encounter in which it belongs.  

## 2020-06-20 NOTE — Telephone Encounter (Signed)
Called pt, no answer, vm full. AW

## 2020-06-21 ENCOUNTER — Observation Stay (HOSPITAL_COMMUNITY)
Admission: RE | Admit: 2020-06-21 | Discharge: 2020-06-21 | Disposition: A | Discharge status: Home / Self Care | Payer: Medicaid Other | Source: Ambulatory Visit | Attending: Interventional Radiology | Admitting: Interventional Radiology

## 2020-06-21 ENCOUNTER — Inpatient Hospital Stay (HOSPITAL_COMMUNITY): Payer: Medicaid Other | Admitting: Physician Assistant

## 2020-06-21 ENCOUNTER — Encounter (HOSPITAL_COMMUNITY)
Admission: RE | Disposition: A | Discharge status: Home / Self Care | Payer: Self-pay | Source: Home / Self Care | Attending: Interventional Radiology

## 2020-06-21 ENCOUNTER — Encounter (HOSPITAL_COMMUNITY): Payer: Self-pay | Admitting: Interventional Radiology

## 2020-06-21 ENCOUNTER — Observation Stay (HOSPITAL_COMMUNITY)
Admission: RE | Admit: 2020-06-21 | Discharge: 2020-06-22 | Disposition: A | Discharge status: Home / Self Care | Payer: Medicaid Other | Attending: Interventional Radiology | Admitting: Interventional Radiology

## 2020-06-21 DIAGNOSIS — I771 Stricture of artery: Secondary | ICD-10-CM

## 2020-06-21 DIAGNOSIS — Z8673 Personal history of transient ischemic attack (TIA), and cerebral infarction without residual deficits: Secondary | ICD-10-CM | POA: Insufficient documentation

## 2020-06-21 DIAGNOSIS — I6523 Occlusion and stenosis of bilateral carotid arteries: Secondary | ICD-10-CM | POA: Diagnosis present

## 2020-06-21 DIAGNOSIS — Z7902 Long term (current) use of antithrombotics/antiplatelets: Secondary | ICD-10-CM | POA: Diagnosis not present

## 2020-06-21 DIAGNOSIS — Z7982 Long term (current) use of aspirin: Secondary | ICD-10-CM | POA: Insufficient documentation

## 2020-06-21 DIAGNOSIS — E1122 Type 2 diabetes mellitus with diabetic chronic kidney disease: Secondary | ICD-10-CM | POA: Diagnosis not present

## 2020-06-21 DIAGNOSIS — N189 Chronic kidney disease, unspecified: Secondary | ICD-10-CM | POA: Insufficient documentation

## 2020-06-21 DIAGNOSIS — Z79899 Other long term (current) drug therapy: Secondary | ICD-10-CM | POA: Insufficient documentation

## 2020-06-21 DIAGNOSIS — I129 Hypertensive chronic kidney disease with stage 1 through stage 4 chronic kidney disease, or unspecified chronic kidney disease: Secondary | ICD-10-CM | POA: Insufficient documentation

## 2020-06-21 DIAGNOSIS — Z7984 Long term (current) use of oral hypoglycemic drugs: Secondary | ICD-10-CM | POA: Insufficient documentation

## 2020-06-21 DIAGNOSIS — E785 Hyperlipidemia, unspecified: Secondary | ICD-10-CM | POA: Insufficient documentation

## 2020-06-21 DIAGNOSIS — F17210 Nicotine dependence, cigarettes, uncomplicated: Secondary | ICD-10-CM | POA: Diagnosis not present

## 2020-06-21 HISTORY — DX: Essential (primary) hypertension: I10

## 2020-06-21 HISTORY — PX: IR US GUIDE VASC ACCESS RIGHT: IMG2390

## 2020-06-21 HISTORY — DX: Gastro-esophageal reflux disease without esophagitis: K21.9

## 2020-06-21 HISTORY — DX: Hyperlipidemia, unspecified: E78.5

## 2020-06-21 HISTORY — PX: IR INTRAVSC STENT CERV CAROTID W/EMB-PROT MOD SED INCL ANGIO: IMG2303

## 2020-06-21 HISTORY — DX: Depression, unspecified: F32.A

## 2020-06-21 HISTORY — PX: IR ANGIO INTRA EXTRACRAN SEL COM CAROTID INNOMINATE UNI L MOD SED: IMG5358

## 2020-06-21 HISTORY — DX: Dyspnea, unspecified: R06.00

## 2020-06-21 HISTORY — PX: RADIOLOGY WITH ANESTHESIA: SHX6223

## 2020-06-21 HISTORY — DX: Type 2 diabetes mellitus without complications: E11.9

## 2020-06-21 HISTORY — DX: Anxiety disorder, unspecified: F41.9

## 2020-06-21 HISTORY — PX: IR ANGIO VERTEBRAL SEL SUBCLAVIAN INNOMINATE UNI R MOD SED: IMG5365

## 2020-06-21 LAB — PLATELET INHIBITION P2Y12: Platelet Function  P2Y12: 87 [PRU] — ABNORMAL LOW (ref 182–335)

## 2020-06-21 LAB — CBC WITH DIFFERENTIAL/PLATELET
Abs Immature Granulocytes: 0.02 10*3/uL (ref 0.00–0.07)
Basophils Absolute: 0.1 10*3/uL (ref 0.0–0.1)
Basophils Relative: 1 %
Eosinophils Absolute: 0.5 10*3/uL (ref 0.0–0.5)
Eosinophils Relative: 7 %
HCT: 39.1 % (ref 39.0–52.0)
Hemoglobin: 12.6 g/dL — ABNORMAL LOW (ref 13.0–17.0)
Immature Granulocytes: 0 %
Lymphocytes Relative: 27 %
Lymphs Abs: 2.1 10*3/uL (ref 0.7–4.0)
MCH: 30.7 pg (ref 26.0–34.0)
MCHC: 32.2 g/dL (ref 30.0–36.0)
MCV: 95.1 fL (ref 80.0–100.0)
Monocytes Absolute: 0.6 10*3/uL (ref 0.1–1.0)
Monocytes Relative: 8 %
Neutro Abs: 4.4 10*3/uL (ref 1.7–7.7)
Neutrophils Relative %: 57 %
Platelets: 321 10*3/uL (ref 150–400)
RBC: 4.11 MIL/uL — ABNORMAL LOW (ref 4.22–5.81)
RDW: 13.4 % (ref 11.5–15.5)
WBC: 7.7 10*3/uL (ref 4.0–10.5)
nRBC: 0 % (ref 0.0–0.2)

## 2020-06-21 LAB — BASIC METABOLIC PANEL
Anion gap: 6 (ref 5–15)
BUN: 22 mg/dL — ABNORMAL HIGH (ref 6–20)
CO2: 28 mmol/L (ref 22–32)
Calcium: 9.4 mg/dL (ref 8.9–10.3)
Chloride: 105 mmol/L (ref 98–111)
Creatinine, Ser: 2.57 mg/dL — ABNORMAL HIGH (ref 0.61–1.24)
GFR, Estimated: 28 mL/min — ABNORMAL LOW (ref >60)
Glucose, Bld: 75 mg/dL (ref 70–99)
Potassium: 4 mmol/L (ref 3.5–5.1)
Sodium: 139 mmol/L (ref 135–145)

## 2020-06-21 LAB — GLUCOSE, CAPILLARY
Glucose-Capillary: 82 mg/dL (ref 70–99)
Glucose-Capillary: 78 mg/dL (ref 70–99)
Glucose-Capillary: 165 mg/dL — ABNORMAL HIGH (ref 70–99)
Glucose-Capillary: 291 mg/dL — ABNORMAL HIGH (ref 70–99)

## 2020-06-21 LAB — PROTIME-INR
INR: 0.9 (ref 0.8–1.2)
Prothrombin Time: 11.8 seconds (ref 11.4–15.2)

## 2020-06-21 LAB — MRSA PCR SCREENING: MRSA by PCR: NEGATIVE

## 2020-06-21 SURGERY — Surgical Case
Anesthesia: *Unknown

## 2020-06-21 SURGERY — RADIOLOGY WITH ANESTHESIA
Anesthesia: General

## 2020-06-21 MED ORDER — CLEVIDIPINE BUTYRATE 0.5 MG/ML IV EMUL
INTRAVENOUS | Status: DC | PRN
  Administered 2020-06-21: 2 mg/h via INTRAVENOUS

## 2020-06-21 MED ORDER — DEXAMETHASONE SODIUM PHOSPHATE 10 MG/ML IJ SOLN
INTRAMUSCULAR | Status: DC | PRN
  Administered 2020-06-21: 5 mg via INTRAVENOUS

## 2020-06-21 MED ORDER — HYDRALAZINE HCL 50 MG PO TABS
50.0000 mg | ORAL_TABLET | Freq: Three times a day (TID) | ORAL | Status: DC
  Administered 2020-06-21 – 2020-06-22 (×3): 50 mg via ORAL
  Filled 2020-06-21 (×3): qty 1

## 2020-06-21 MED ORDER — ASPIRIN 325 MG PO TABS: 325.0000 mg | ORAL_TABLET | Freq: Once | ORAL | Status: DC

## 2020-06-21 MED ORDER — AMLODIPINE BESYLATE 10 MG PO TABS
10.0000 mg | ORAL_TABLET | Freq: Every day | ORAL | Status: DC
  Administered 2020-06-22: 10 mg via ORAL
  Filled 2020-06-21: qty 1

## 2020-06-21 MED ORDER — EPHEDRINE SULFATE-NACL 50-0.9 MG/10ML-% IV SOSY
PREFILLED_SYRINGE | INTRAVENOUS | Status: DC | PRN
  Administered 2020-06-21: 5 mg via INTRAVENOUS

## 2020-06-21 MED ORDER — ACETAMINOPHEN 325 MG PO TABS
650.0000 mg | ORAL_TABLET | ORAL | Status: DC | PRN
  Administered 2020-06-21 – 2020-06-22 (×4): 650 mg via ORAL
  Filled 2020-06-21 (×4): qty 2

## 2020-06-21 MED ORDER — GLIMEPIRIDE 2 MG PO TABS
3.0000 mg | ORAL_TABLET | Freq: Every day | ORAL | Status: DC
  Administered 2020-06-22: 3 mg via ORAL
  Filled 2020-06-21: qty 1

## 2020-06-21 MED ORDER — SODIUM CHLORIDE 0.9 % IV SOLN: INTRAVENOUS | Status: AC

## 2020-06-21 MED ORDER — CHLORHEXIDINE GLUCONATE CLOTH 2 % EX PADS
6.0000 | MEDICATED_PAD | Freq: Every day | CUTANEOUS | Status: DC
  Administered 2020-06-21: 6 via TOPICAL

## 2020-06-21 MED ORDER — PHENYLEPHRINE HCL-NACL 10-0.9 MG/250ML-% IV SOLN
INTRAVENOUS | Status: DC | PRN
  Administered 2020-06-21: 35 ug/min via INTRAVENOUS

## 2020-06-21 MED ORDER — HEPARIN SODIUM (PORCINE) 1000 UNIT/ML IJ SOLN
INTRAMUSCULAR | Status: DC | PRN
  Administered 2020-06-21: 3000 [IU] via INTRAVENOUS

## 2020-06-21 MED ORDER — POLYETHYLENE GLYCOL 3350 17 G PO PACK: 17.0000 g | PACK | Freq: Every day | ORAL | Status: DC

## 2020-06-21 MED ORDER — ACETAMINOPHEN 500 MG PO TABS
1000.0000 mg | ORAL_TABLET | Freq: Once | ORAL | Status: AC
  Administered 2020-06-21: 1000 mg via ORAL
  Filled 2020-06-21: qty 2

## 2020-06-21 MED ORDER — ASPIRIN EC 325 MG PO TBEC
325.0000 mg | DELAYED_RELEASE_TABLET | ORAL | Status: AC
  Administered 2020-06-21: 325 mg via ORAL
  Filled 2020-06-21: qty 1

## 2020-06-21 MED ORDER — PROPOFOL 10 MG/ML IV BOLUS
INTRAVENOUS | Status: DC | PRN
  Administered 2020-06-21: 100 mg via INTRAVENOUS

## 2020-06-21 MED ORDER — ACETAMINOPHEN 160 MG/5ML PO SOLN: 650.0000 mg | ORAL | Status: DC | PRN

## 2020-06-21 MED ORDER — SUGAMMADEX SODIUM 200 MG/2ML IV SOLN
INTRAVENOUS | Status: DC | PRN
  Administered 2020-06-21: 150 mg via INTRAVENOUS

## 2020-06-21 MED ORDER — ALBUTEROL SULFATE (2.5 MG/3ML) 0.083% IN NEBU: 2.5000 mg | INHALATION_SOLUTION | Freq: Four times a day (QID) | RESPIRATORY_TRACT | Status: DC | PRN

## 2020-06-21 MED ORDER — SODIUM CHLORIDE 0.9 % IV SOLN: INTRAVENOUS | Status: DC

## 2020-06-21 MED ORDER — ASPIRIN EC 325 MG PO TBEC
325.0000 mg | DELAYED_RELEASE_TABLET | Freq: Every day | ORAL | Status: DC
  Administered 2020-06-22: 325 mg via ORAL
  Filled 2020-06-21: qty 1

## 2020-06-21 MED ORDER — CLOPIDOGREL BISULFATE 75 MG PO TABS
ORAL_TABLET | ORAL | Status: AC
  Administered 2020-06-21: 300 mg
  Filled 2020-06-21: qty 4

## 2020-06-21 MED ORDER — TIZANIDINE HCL 2 MG PO TABS
2.0000 mg | ORAL_TABLET | Freq: Three times a day (TID) | ORAL | Status: DC
  Administered 2020-06-21 – 2020-06-22 (×3): 2 mg via ORAL
  Filled 2020-06-21 (×3): qty 1

## 2020-06-21 MED ORDER — IOHEXOL 300 MG/ML  SOLN
50.0000 mL | Freq: Once | INTRAMUSCULAR | Status: AC | PRN
  Administered 2020-06-21: 20 mL via INTRA_ARTERIAL

## 2020-06-21 MED ORDER — CLEVIDIPINE BUTYRATE 0.5 MG/ML IV EMUL
0.0000 mg/h | INTRAVENOUS | Status: DC
  Administered 2020-06-21: 8 mg/h via INTRAVENOUS
  Administered 2020-06-21: 10 mg/h via INTRAVENOUS
  Filled 2020-06-21 (×3): qty 50

## 2020-06-21 MED ORDER — ATORVASTATIN CALCIUM 40 MG PO TABS
40.0000 mg | ORAL_TABLET | Freq: Every day | ORAL | Status: DC
  Administered 2020-06-22: 40 mg via ORAL
  Filled 2020-06-21: qty 1

## 2020-06-21 MED ORDER — FENTANYL CITRATE (PF) 100 MCG/2ML IJ SOLN
25.0000 ug | INTRAMUSCULAR | Status: DC | PRN
  Administered 2020-06-21: 50 ug via INTRAVENOUS

## 2020-06-21 MED ORDER — CLOPIDOGREL BISULFATE 75 MG PO TABS
75.0000 mg | ORAL_TABLET | Freq: Every day | ORAL | Status: DC
  Administered 2020-06-22: 75 mg via ORAL
  Filled 2020-06-21: qty 1

## 2020-06-21 MED ORDER — FAMOTIDINE 20 MG PO TABS
20.0000 mg | ORAL_TABLET | Freq: Every day | ORAL | Status: DC
  Administered 2020-06-22: 20 mg via ORAL
  Filled 2020-06-21: qty 1

## 2020-06-21 MED ORDER — CEFAZOLIN SODIUM-DEXTROSE 2-4 GM/100ML-% IV SOLN
2.0000 g | INTRAVENOUS | Status: AC
  Administered 2020-06-21: 2 g via INTRAVENOUS
  Filled 2020-06-21: qty 100

## 2020-06-21 MED ORDER — FENTANYL CITRATE (PF) 250 MCG/5ML IJ SOLN
INTRAMUSCULAR | Status: DC | PRN
  Administered 2020-06-21 (×2): 50 ug via INTRAVENOUS

## 2020-06-21 MED ORDER — FENTANYL CITRATE (PF) 100 MCG/2ML IJ SOLN
INTRAMUSCULAR | Status: AC
  Filled 2020-06-21: qty 2

## 2020-06-21 MED ORDER — LIDOCAINE HCL 1 % IJ SOLN
INTRAMUSCULAR | Status: AC
  Filled 2020-06-21: qty 20

## 2020-06-21 MED ORDER — ASPIRIN EC 325 MG PO TBEC
DELAYED_RELEASE_TABLET | ORAL | Status: AC
  Administered 2020-06-21: 325 mg
  Filled 2020-06-21: qty 1

## 2020-06-21 MED ORDER — NIMODIPINE 30 MG PO CAPS: 0.0000 mg | ORAL_CAPSULE | ORAL | Status: DC

## 2020-06-21 MED ORDER — ASPIRIN 325 MG PO TABS: 650.0000 mg | ORAL_TABLET | Freq: Once | ORAL | Status: DC

## 2020-06-21 MED ORDER — ACETAMINOPHEN 650 MG RE SUPP: 650.0000 mg | RECTAL | Status: DC | PRN

## 2020-06-21 MED ORDER — ONDANSETRON HCL 4 MG/2ML IJ SOLN
INTRAMUSCULAR | Status: DC | PRN
  Administered 2020-06-21: 4 mg via INTRAVENOUS

## 2020-06-21 MED ORDER — ROCURONIUM BROMIDE 10 MG/ML (PF) SYRINGE
PREFILLED_SYRINGE | INTRAVENOUS | Status: DC | PRN
  Administered 2020-06-21: 20 mg via INTRAVENOUS
  Administered 2020-06-21: 60 mg via INTRAVENOUS

## 2020-06-21 MED ORDER — LIDOCAINE 2% (20 MG/ML) 5 ML SYRINGE
INTRAMUSCULAR | Status: DC | PRN
  Administered 2020-06-21: 60 mg via INTRAVENOUS

## 2020-06-21 MED ORDER — CHLORHEXIDINE GLUCONATE 0.12 % MT SOLN
OROMUCOSAL | Status: AC
  Administered 2020-06-21: 15 mL
  Filled 2020-06-21: qty 15

## 2020-06-21 MED ORDER — CLOPIDOGREL BISULFATE 75 MG PO TABS: 75.0000 mg | ORAL_TABLET | ORAL | Status: DC

## 2020-06-21 MED ORDER — PHENYLEPHRINE 40 MCG/ML (10ML) SYRINGE FOR IV PUSH (FOR BLOOD PRESSURE SUPPORT)
PREFILLED_SYRINGE | INTRAVENOUS | Status: DC | PRN
  Administered 2020-06-21: 40 ug via INTRAVENOUS
  Administered 2020-06-21: 80 ug via INTRAVENOUS

## 2020-06-21 MED ORDER — CLOPIDOGREL BISULFATE 75 MG PO TABS: 300.0000 mg | ORAL_TABLET | Freq: Once | ORAL | Status: DC

## 2020-06-21 MED ORDER — IOHEXOL 300 MG/ML  SOLN
100.0000 mL | Freq: Once | INTRAMUSCULAR | Status: AC | PRN
  Administered 2020-06-21: 50 mL via INTRA_ARTERIAL

## 2020-06-21 NOTE — Progress Notes (Signed)
Interventional Radiology Progress Note  60 yo male, SP right ICA stent via RCFA access.   ICU status overnight for observation.    Feels fine.  He has some left shoulder/upper extremity pain which is baseline.   Currently SBP are 140-150's without cleviprex ggt.  Goal is 120-140.    Right CFA access site is clean/dry.  No hematoma.  Extremities warm and well perfused.   Baseline neuro exam with no new deficit.   Plan for continue overnight observation, with cleviprex ggt as needed.  Advance diet as tolerated. Starting home meds.    Signed,  Dulcy Fanny. Earleen Newport, DO

## 2020-06-21 NOTE — Anesthesia Procedure Notes (Signed)
Procedure Name: Intubation Date/Time: 06/21/2020 8:39 AM Performed by: Gaylene Brooks, CRNA Pre-anesthesia Checklist: Patient identified, Emergency Drugs available, Suction available and Patient being monitored Patient Re-evaluated:Patient Re-evaluated prior to induction Oxygen Delivery Method: Circle System Utilized Preoxygenation: Pre-oxygenation with 100% oxygen Induction Type: IV induction Ventilation: Mask ventilation without difficulty and Oral airway inserted - appropriate to patient size Laryngoscope Size: Sabra Heck and 2 Grade View: Grade II Tube type: Oral Tube size: 7.5 mm Number of attempts: 1 Airway Equipment and Method: Stylet and Oral airway Placement Confirmation: ETT inserted through vocal cords under direct vision,  positive ETCO2 and breath sounds checked- equal and bilateral Secured at: 23 cm Tube secured with: Tape Dental Injury: Teeth and Oropharynx as per pre-operative assessment

## 2020-06-21 NOTE — Sedation Documentation (Signed)
Right groin sheath removed. 7 Fr. Celt device deployed

## 2020-06-21 NOTE — Transfer of Care (Signed)
Immediate Anesthesia Transfer of Care Note  Patient: Bobby Branch  Procedure(s) Performed: RADIOLOGY WITH ANESTHESIA  STENTING (N/A )  Patient Location: PACU  Anesthesia Type:General  Level of Consciousness: awake, alert , oriented and patient cooperative  Airway & Oxygen Therapy: Patient Spontanous Breathing and Patient connected to nasal cannula oxygen  Post-op Assessment: Report given to RN, Post -op Vital signs reviewed and stable and Patient moving all extremities X 4  Post vital signs: Reviewed and stable  Last Vitals:  Vitals Value Taken Time  BP 144/73 06/21/20 1047  Temp    Pulse 86 06/21/20 1049  Resp 14 06/21/20 1049  SpO2 100 % 06/21/20 1049  Vitals shown include unvalidated device data.  Last Pain:  Vitals:   06/21/20 0649  TempSrc:   PainSc: 3          Complications: No complications documented.

## 2020-06-21 NOTE — Procedures (Addendum)
Neuro-Interventional Radiology  Post Carotid Procedure Note  Operator:    Dr. Earleen Newport Assistant:   None  History:   60 yo male with high grade symptomatic right ICA stenosis, previous tandem occlusion/ELVO treated with PTA   Baseline mRS:  3  Procedure: US guided right CFA access Cervical & Cerebral Angiogram Right ICA stent, 8-6 x 69m Xact stent, with distal embolic protection CELT closure of right CFA  Findings:    Recurrent stenosis of the right ICA after prior acute stroke treatment/PTA.   Pre: 84% by NASCET Post: 20% by NASCET  Anesthesia:   GETA  EBL:    50000000    Complication:  None   Medication: 3K U IV heparin  Recommendations: - ICU 23 hr obs - right hip straight x 6 hours, post closure device - Goal SBP 120-140.   - Frequent NV checks - continue dual antiplatelet medication - NIR to follow   Family/Girlfriend Chastity [phone] Signed,  JDulcy Fanny WEarleen Newport DO    pos

## 2020-06-21 NOTE — Progress Notes (Signed)
Patient arrived to 4NICU with; 1x cell phone, 1x wallet, pair of sandals, and pants.

## 2020-06-21 NOTE — Progress Notes (Signed)
Interventional Radiology Brief Note:  Patient with slow, persistent oozing from sheath tract in PACU post-procedure.  Pressure held by IR tech/RN for 30 minutes and V-pad placed.  PA to bedside for site check.  Groin currently stable.  Non-tender.  No palpable hematoma or pseudoaneurysm. Dressing clean and dry.  Continue bed rest/straight leg x6 hrs since unit arrival.  Patient asking about muscle relaxer due to shoulder twitching.  He appears groggy from procedure, still clearing anesthesia but is awake and conversant.  Discussed use of conservative measures and pain medication for now while he continues to recover with frequent neuro checks.   RN and patient aware of care plan this evening.   Brynda Greathouse, MS RD PA-C

## 2020-06-21 NOTE — Anesthesia Postprocedure Evaluation (Signed)
Anesthesia Post Note  Patient: Makade Rondon  Procedure(s) Performed: RADIOLOGY WITH ANESTHESIA  STENTING (N/A )     Patient location during evaluation: PACU Anesthesia Type: General Level of consciousness: awake and alert Pain management: pain level controlled Vital Signs Assessment: post-procedure vital signs reviewed and stable Respiratory status: spontaneous breathing, nonlabored ventilation, respiratory function stable and patient connected to nasal cannula oxygen Cardiovascular status: blood pressure returned to baseline and stable Postop Assessment: no apparent nausea or vomiting Anesthetic complications: no   No complications documented.  Last Vitals:  Vitals:   06/21/20 1100 06/21/20 1115  BP: 130/70 131/72  Pulse: 87 84  Resp: 15 13  Temp:    SpO2: 100% 100%               Harjit Leider,W. EDMOND

## 2020-06-22 ENCOUNTER — Other Ambulatory Visit (HOSPITAL_COMMUNITY): Payer: Self-pay

## 2020-06-22 ENCOUNTER — Encounter (HOSPITAL_COMMUNITY): Payer: Self-pay | Admitting: Interventional Radiology

## 2020-06-22 ENCOUNTER — Telehealth: Payer: Self-pay | Admitting: *Deleted

## 2020-06-22 DIAGNOSIS — I6523 Occlusion and stenosis of bilateral carotid arteries: Secondary | ICD-10-CM | POA: Diagnosis not present

## 2020-06-22 MED ORDER — AMLODIPINE BESYLATE 10 MG PO TABS: 10.0000 mg | ORAL_TABLET | Freq: Every day | ORAL | 0 refills | Status: DC

## 2020-06-22 MED ORDER — FAMOTIDINE 20 MG PO TABS: 20.0000 mg | ORAL_TABLET | Freq: Every day | ORAL | 0 refills | Status: DC

## 2020-06-22 MED ORDER — ESCITALOPRAM OXALATE 10 MG PO TABS: 10.0000 mg | ORAL_TABLET | Freq: Every day | ORAL | 0 refills | Status: DC

## 2020-06-22 MED ORDER — TIZANIDINE HCL 2 MG PO TABS: 2.0000 mg | ORAL_TABLET | Freq: Three times a day (TID) | ORAL | 0 refills | Status: DC

## 2020-06-22 MED ORDER — GLIMEPIRIDE 2 MG PO TABS: 3.0000 mg | ORAL_TABLET | Freq: Every day | ORAL | 0 refills | Status: DC

## 2020-06-22 MED ORDER — ASPIRIN 81 MG PO TBEC
81.0000 mg | DELAYED_RELEASE_TABLET | Freq: Every day | ORAL | 11 refills | Status: AC
  Filled 2020-06-22 (×2): qty 30, 30d supply, fill #0

## 2020-06-22 MED ORDER — ATORVASTATIN CALCIUM 40 MG PO TABS: 40.0000 mg | ORAL_TABLET | Freq: Every day | ORAL | 0 refills | Status: DC

## 2020-06-22 MED ORDER — CLOPIDOGREL BISULFATE 75 MG PO TABS
75.0000 mg | ORAL_TABLET | Freq: Every day | ORAL | 3 refills | Status: DC
  Filled 2020-06-22 (×2): qty 30, 30d supply, fill #0

## 2020-06-22 MED ORDER — HYDRALAZINE HCL 50 MG PO TABS: 50.0000 mg | ORAL_TABLET | Freq: Three times a day (TID) | ORAL | 0 refills | Status: DC

## 2020-06-22 NOTE — Discharge Instructions (Signed)
Femoral Site Care This sheet gives you information about how to care for yourself after your procedure. Your health care provider may also give you more specific instructions. If you have problems or questions, contact your health care provider. What can I expect after the procedure? After the procedure, it is common to have:  Bruising that usually fades within 1-2 weeks.  Tenderness at the site. Follow these instructions at home: Wound care 1. Follow instructions from your health care provider about how to take care of your insertion site. Make sure you: ? Wash your hands with soap and water before you change your bandage (dressing). If soap and water are not available, use hand sanitizer. ? Change your dressing as directed- pressure dressing removed 24 hours post-procedure (and switch for bandaid), bandaid removed 72 hours post-procedure 2. Do not take baths, swim, or use a hot tub for 7 days post-procedure. 3. You may shower 48 hours after the procedure or as told by your health care provider. ? Gently wash the site with plain soap and water. ? Pat the area dry with a clean towel. ? Do not rub the site. This may cause bleeding. 4. Check your site every day for signs of infection. Check for: ? Redness, swelling, or pain. ? Fluid or blood. ? Warmth. ? Pus or a bad smell. Activity  Do not stoop, bend, or lift anything that is heavier than 10 lb (4.5 kg) for 2 weeks post-procedure.  Do not drive self for 2 weeks post-procedure. Contact a health care provider if you have:  A fever or chills.  You have redness, swelling, or pain around your insertion site. Get help right away if:  The catheter insertion area swells very fast.  You pass out.  You suddenly start to sweat or your skin gets clammy.  The catheter insertion area is bleeding, and the bleeding does not stop when you hold steady pressure on the area.  The area near or just beyond the catheter insertion site becomes  pale, cool, tingly, or numb. These symptoms may represent a serious problem that is an emergency. Do not wait to see if the symptoms will go away. Get medical help right away. Call your local emergency services (911 in the [place] Do not drive yourself to the hospital.  This information is not intended to replace advice given to you by your health care provider. Make sure you discuss any questions you have with your health care provider. Document Revised: 02/02/2017 Document Reviewed: 02/02/2017 Elsevier Patient Education  2020 [employer] 

## 2020-06-22 NOTE — Telephone Encounter (Signed)
Mr Patricio significant other called to request refills on his medications we ordered when discharged out of inpt rehab 05/22/20. They did not come to his hospital follow up/TC on 06/04/20. I told them the primary care should refill.  She says he does not have a primary care. We have made him an appt with our office for 07/03/20 and I have given her the number for [facility] to call TODAY and get an appt to establish with a primary care. No further refills beyond this month for his medications. She agrees to call after our phone call ends.

## 2020-06-22 NOTE — Progress Notes (Signed)
PCP appointment made at [facility]; details on AVS.   Cedric Fishman LCSW

## 2020-06-22 NOTE — Progress Notes (Signed)
Patient ready for discharge. IV removed and patient dressed. AVS will be printed and discussed with pt and significant other at bedside. Reathel Turi, Rande Brunt, RN

## 2020-06-22 NOTE — Discharge Summary (Signed)
Patient ID: Bobby Branch MRN: [id number] DOB/AGE: 1960-06-04 60 y.o.  Admit date: 06/21/2020 Discharge date: 06/22/2020  Supervising Physician: Corrie Mckusick  Patient Status: [facility] - In-pt  Admission Diagnoses: Internal carotid artery stenosis, bilateral  Discharge Diagnoses:  Active Problems:   Internal carotid artery stenosis, bilateral   Discharged Condition: stable  Hospital Course:  Patient presented to [facility] 06/21/2020 for an image-guided cerebral arteriogram with revascularization of high-grade symptomatic right ICA stenosis using stent placement via right femoral approach by Dr. Earleen Newport. Procedure occurred without major complications and patient was transferred to PACU in stable condition (VSS, right femoral puncture site stable). In PACU, patient noted to have oozing from right femoral puncture site. Manual pressure was held for approximately 30 minutes with hemostasis of right femoral puncture site. He was then transferred to neuro ICU in stable condition (VSS, right femoral puncture site stable) for overnight observation. No major events occurred overnight.  Patient awake and alert laying in bed with no complaints at this time. Asking for "wires to be removed" (tele). Patient's significant other at bedside. Moving all extremities. Right femoral puncture site stable. Cleviprex discontinued and patient transferred to home anti-hypertensive medications. Plan to discharge home today and follow-up with Dr. Earleen Newport in clinic 4 weeks after discharge.  Patient's significant other requesting refill on long-term medications (prescribed while patient was in CIR). Patient without PCP at this time. Will consult transition of care on discharge for PCP needs.  Smoking cessation discussed with patient.   Consults: None  Significant Diagnostic Studies: IR INTRAVSC STENT CERV CAROTID W/EMB-PROT MOD SED  Result Date: 06/21/2020 INDICATION: 60 year old male with high-grade symptomatic stenosis  of the right common carotid artery presents for treatment EXAM: ULTRASOUND-GUIDED RIGHT COMMON FEMORAL ARTERY ACCESS CERVICAL AND CEREBRAL ANGIOGRAM RIGHT CERVICAL INTERNAL CAROTID ARTERY STENTING WITH EMBOLIC PROTECTION DEPLOYMENT OF CLOSURE DEVICE MEDICATIONS: 3000 units heparin.  2 g Ancef ANESTHESIA/SEDATION: The anesthesia team was present to provide general endotracheal tube anesthesia and for patient monitoring during the procedure. Intubation was performed in room 2. interventional neuro radiology nursing staff was also present. FLUOROSCOPY TIME:  Fluoroscopy Time: 12 minutes 54 seconds (590 mGy). COMPLICATIONS: None TECHNIQUE: Informed written consent was obtained from the patient after a thorough discussion of the procedural risks, benefits and alternatives. Specific risks discussed include: Bleeding, infection, contrast reaction, kidney injury/failure, need for further procedure/surgery, arterial injury or dissection, embolization to new territory, intracranial hemorrhage (10-15% risk), neurologic deterioration, cardiopulmonary collapse, death. All questions were addressed. Maximal Sterile Barrier Technique was utilized including during the procedure including caps, mask, sterile gowns, sterile gloves, sterile drape, hand hygiene and skin antiseptic. A timeout was performed prior to the initiation of the procedure. The anesthesia team was present to provide general endotracheal tube anesthesia and for patient monitoring during the procedure. Interventional neuro radiology nursing staff was also present. PROCEDURE: After the anesthesia team initiated general anesthesia, a time-out was performed. The patient is prepped and draped in the usual sterile fashion, room 2 in the neuro IR suite. Ultrasound survey of the right inguinal region was performed with images stored and sent to PACs. 11 blade scalpel was used to make a small incision. Blunt dissection was performed with ultrasound guidance. A micropuncture  needle was used access the right common femoral artery under ultrasound. With excellent arterial blood flow returned, an .018 micro wire was passed through the needle, observed to enter the abdominal aorta under fluoroscopy. The needle was removed, and a micropuncture sheath was placed over the wire. The inner  dilator and wire were removed, and an 035 Bentson wire was advanced under fluoroscopy into the abdominal aorta. The sheath was removed and a standard 5 [place] vascular sheath was placed. The dilator was removed and the sheath was flushed. A 44F JB-1 diagnostic catheter was advanced over the wire to the proximal descending thoracic aorta. Wire was then removed. Double flush of the catheter was performed. We then proceeded with cerebral angiogram. Images were reviewed. We then proceeded with our treatment plan. 3000 units of IV heparin was administered. The JB 1 catheter was used to select the innominate artery, directed then into the right common carotid artery. With an adequate catheter position in the common carotid artery, roadmap angiogram was performed. The catheter was then navigated with the assistance of a Glidewire into the external carotid artery branches. Wire was removed and a rosen wire was placed. Catheter was removed on the Rosen wire. The 5 French sheath was then removed and exchanged for 8 [place] Pinnacle sheath. Sheath was flushed and attached to pressurized and heparinized saline bag for constant forward flow. Then an 8 [place], 85 cm Emboguard balloon tip catheter was prepared on the back table with inflation of the balloon with 50/50 concentration of dilute contrast. The balloon catheter was then advanced over the wire, positioned into the distal right common carotid artery. Copious back flush was performed and the balloon catheter was attached to heparinized and pressurized saline bag for forward flow. Angiogram was then performed. After review of the images and measurements, we elected to  proceed with distal embolic protection using Nav 6 device, 4.90m size. We selected an 8-6 x 40 mm Xact stent, with a plan for pre-dilation 4 mm x 30 mm balloon angioplasty with a Viatrac balloon. These devices were prepped on the back table, including the retrieval device of the Nav 6. Confirmation angiogram was performed for road map guide. The bare wire was advanced through the stenotic region of the ICA, into a distal position within the cervical ICA. The Nav-6 device was then advanced into a safe location above the carotid lesion with deployment of the Nav 6. Timer was started, and a gentle infusion of contrast confirmed forward flow. The deployment device was removed on the bare wire and was disposed. 4 mm balloon angioplasty was then performed. Nine atmospheres was observed. The patient remained hemodynamically stable throughout this maneuver. Balloon was removed and disposed. The 8-6 x 40 mm Xact stent was then deployed at the lesion with the deployment device removed from the bare wire and disposed. Angiogram was performed. The retrieval device was then passed over the bare wire and the Nav 6 embolic protection device was retrieved. The entire unit was removed from the balloon guide catheter under fluoroscopic observation as this traversed the new stent. We stopped the clock, 8 minutes and 7 seconds of embolic protection open time. Angiogram was performed. We then observed 5 minutes interval and a final angiogram was performed. Slight spasm was present at the margin of the stent, with expected resolution and no evidence of high-grade stenosis secondary to the spasm. The 55 centimeter 8 French sheath was exchanged for a 7 [place]ath which was ultimately removed with deployment of 7 [place]ELT device. Patient tolerated the procedure well and remained hemodynamically stable throughout. No complications were encountered and no significant blood loss encountered. FINDINGS: Initial Findings: Right common  carotid artery:  Normal course caliber and contour. Right external carotid artery: Patent with antegrade flow. Right internal carotid artery: Recurrent  irregular plaque at the right carotid bifurcation, with eccentric plaque narrowing the lumen 84% stenosis by NASCET criteria. Fairly straight segment beyond with flow into the cerebral vasculature. Completion Findings: Right MCA: After balloon angioplasty and stenting of the cervical ICA, there is 20% stenosis. Mild spasm just beyond the stent margin, non flow limiting. Initial Findings: Left common carotid artery:  Normal course caliber and contour. Left external carotid artery: Patent with antegrade flow. Left internal carotid artery: Irregular plaque at the proximal right cervical ICA on the left, estimated length 26 mm at the level of C2-C3. There is irregular surface/lumen of the plaque just beyond the bifurcation. Estimated degree of narrowing 71%. Fairly straight course after the plaque proximally. Left-sided cerebral vasculature was not evaluated given the patient's baseline renal function. Right vertebral artery: Patency of the right vertebral artery with regular course caliber and contour. No high-grade stenosis identified. Basilar artery patent. IMPRESSION: Status post ultrasound guided access right common femoral artery for cervical/cerebral angiogram and right cervical ICA stenting for high-grade symptomatic stenosis. CELT deployed for hemostasis. Signed, Dulcy Fanny. Dellia Nims, RPVI [facility] Electronically Signed   By: Corrie Mckusick D.O.   On: 06/21/2020 12:34   IR US Guide Vasc Access Right  Result Date: 06/21/2020 INDICATION: 60 year old male with high-grade symptomatic stenosis of the right common carotid artery presents for treatment EXAM: ULTRASOUND-GUIDED RIGHT COMMON FEMORAL ARTERY ACCESS CERVICAL AND CEREBRAL ANGIOGRAM RIGHT CERVICAL INTERNAL CAROTID ARTERY STENTING WITH EMBOLIC  PROTECTION DEPLOYMENT OF CLOSURE DEVICE MEDICATIONS: 3000 units heparin.  2 g Ancef ANESTHESIA/SEDATION: The anesthesia team was present to provide general endotracheal tube anesthesia and for patient monitoring during the procedure. Intubation was performed in room 2. interventional neuro radiology nursing staff was also present. FLUOROSCOPY TIME:  Fluoroscopy Time: 12 minutes 54 seconds (590 mGy). COMPLICATIONS: None TECHNIQUE: Informed written consent was obtained from the patient after a thorough discussion of the procedural risks, benefits and alternatives. Specific risks discussed include: Bleeding, infection, contrast reaction, kidney injury/failure, need for further procedure/surgery, arterial injury or dissection, embolization to new territory, intracranial hemorrhage (10-15% risk), neurologic deterioration, cardiopulmonary collapse, death. All questions were addressed. Maximal Sterile Barrier Technique was utilized including during the procedure including caps, mask, sterile gowns, sterile gloves, sterile drape, hand hygiene and skin antiseptic. A timeout was performed prior to the initiation of the procedure. The anesthesia team was present to provide general endotracheal tube anesthesia and for patient monitoring during the procedure. Interventional neuro radiology nursing staff was also present. PROCEDURE: After the anesthesia team initiated general anesthesia, a time-out was performed. The patient is prepped and draped in the usual sterile fashion, room 2 in the neuro IR suite. Ultrasound survey of the right inguinal region was performed with images stored and sent to PACs. 11 blade scalpel was used to make a small incision. Blunt dissection was performed with ultrasound guidance. A micropuncture needle was used access the right common femoral artery under ultrasound. With excellent arterial blood flow returned, an .018 micro wire was passed through the needle, observed to enter the abdominal aorta  under fluoroscopy. The needle was removed, and a micropuncture sheath was placed over the wire. The inner dilator and wire were removed, and an 035 Bentson wire was advanced under fluoroscopy into the abdominal aorta. The sheath was removed and a standard 5 [place] vascular sheath was placed. The dilator was removed and the sheath was flushed. A 74F JB-1 diagnostic catheter was advanced over the wire to the proximal  descending thoracic aorta. Wire was then removed. Double flush of the catheter was performed. We then proceeded with cerebral angiogram. Images were reviewed. We then proceeded with our treatment plan. 3000 units of IV heparin was administered. The JB 1 catheter was used to select the innominate artery, directed then into the right common carotid artery. With an adequate catheter position in the common carotid artery, roadmap angiogram was performed. The catheter was then navigated with the assistance of a Glidewire into the external carotid artery branches. Wire was removed and a rosen wire was placed. Catheter was removed on the Rosen wire. The 5 French sheath was then removed and exchanged for 8 [place] Pinnacle sheath. Sheath was flushed and attached to pressurized and heparinized saline bag for constant forward flow. Then an 8 [place], 85 cm Emboguard balloon tip catheter was prepared on the back table with inflation of the balloon with 50/50 concentration of dilute contrast. The balloon catheter was then advanced over the wire, positioned into the distal right common carotid artery. Copious back flush was performed and the balloon catheter was attached to heparinized and pressurized saline bag for forward flow. Angiogram was then performed. After review of the images and measurements, we elected to proceed with distal embolic protection using Nav 6 device, 4.31m size. We selected an 8-6 x 40 mm Xact stent, with a plan for pre-dilation 4 mm x 30 mm balloon angioplasty with a Viatrac balloon. These  devices were prepped on the back table, including the retrieval device of the Nav 6. Confirmation angiogram was performed for road map guide. The bare wire was advanced through the stenotic region of the ICA, into a distal position within the cervical ICA. The Nav-6 device was then advanced into a safe location above the carotid lesion with deployment of the Nav 6. Timer was started, and a gentle infusion of contrast confirmed forward flow. The deployment device was removed on the bare wire and was disposed. 4 mm balloon angioplasty was then performed. Nine atmospheres was observed. The patient remained hemodynamically stable throughout this maneuver. Balloon was removed and disposed. The 8-6 x 40 mm Xact stent was then deployed at the lesion with the deployment device removed from the bare wire and disposed. Angiogram was performed. The retrieval device was then passed over the bare wire and the Nav 6 embolic protection device was retrieved. The entire unit was removed from the balloon guide catheter under fluoroscopic observation as this traversed the new stent. We stopped the clock, 8 minutes and 7 seconds of embolic protection open time. Angiogram was performed. We then observed 5 minutes interval and a final angiogram was performed. Slight spasm was present at the margin of the stent, with expected resolution and no evidence of high-grade stenosis secondary to the spasm. The 55 centimeter 8 French sheath was exchanged for a 7 [place]ath which was ultimately removed with deployment of 7 [place]ELT device. Patient tolerated the procedure well and remained hemodynamically stable throughout. No complications were encountered and no significant blood loss encountered. FINDINGS: Initial Findings: Right common carotid artery:  Normal course caliber and contour. Right external carotid artery: Patent with antegrade flow. Right internal carotid artery: Recurrent irregular plaque at the right carotid bifurcation, with  eccentric plaque narrowing the lumen 84% stenosis by NASCET criteria. Fairly straight segment beyond with flow into the cerebral vasculature. Completion Findings: Right MCA: After balloon angioplasty and stenting of the cervical ICA, there is 20% stenosis. Mild spasm just beyond the stent margin, non  flow limiting. Initial Findings: Left common carotid artery:  Normal course caliber and contour. Left external carotid artery: Patent with antegrade flow. Left internal carotid artery: Irregular plaque at the proximal right cervical ICA on the left, estimated length 26 mm at the level of C2-C3. There is irregular surface/lumen of the plaque just beyond the bifurcation. Estimated degree of narrowing 71%. Fairly straight course after the plaque proximally. Left-sided cerebral vasculature was not evaluated given the patient's baseline renal function. Right vertebral artery: Patency of the right vertebral artery with regular course caliber and contour. No high-grade stenosis identified. Basilar artery patent. IMPRESSION: Status post ultrasound guided access right common femoral artery for cervical/cerebral angiogram and right cervical ICA stenting for high-grade symptomatic stenosis. CELT deployed for hemostasis. Signed, Dulcy Fanny. Dellia Nims, RPVI [facility] Electronically Signed   By: Corrie Mckusick D.O.   On: 06/21/2020 12:34   IR Radiologist Eval & Mgmt  Result Date: 06/05/2020 Please refer to notes tab for details about interventional procedure. (Op Note)  IR ANGIO INTRA EXTRACRAN SEL COM CAROTID INNOMINATE UNI L MOD SED  Result Date: 06/21/2020 INDICATION: 60 year old male with high-grade symptomatic stenosis of the right common carotid artery presents for treatment EXAM: ULTRASOUND-GUIDED RIGHT COMMON FEMORAL ARTERY ACCESS CERVICAL AND CEREBRAL ANGIOGRAM RIGHT CERVICAL INTERNAL CAROTID ARTERY STENTING WITH EMBOLIC PROTECTION DEPLOYMENT OF CLOSURE DEVICE  MEDICATIONS: 3000 units heparin.  2 g Ancef ANESTHESIA/SEDATION: The anesthesia team was present to provide general endotracheal tube anesthesia and for patient monitoring during the procedure. Intubation was performed in room 2. interventional neuro radiology nursing staff was also present. FLUOROSCOPY TIME:  Fluoroscopy Time: 12 minutes 54 seconds (590 mGy). COMPLICATIONS: None TECHNIQUE: Informed written consent was obtained from the patient after a thorough discussion of the procedural risks, benefits and alternatives. Specific risks discussed include: Bleeding, infection, contrast reaction, kidney injury/failure, need for further procedure/surgery, arterial injury or dissection, embolization to new territory, intracranial hemorrhage (10-15% risk), neurologic deterioration, cardiopulmonary collapse, death. All questions were addressed. Maximal Sterile Barrier Technique was utilized including during the procedure including caps, mask, sterile gowns, sterile gloves, sterile drape, hand hygiene and skin antiseptic. A timeout was performed prior to the initiation of the procedure. The anesthesia team was present to provide general endotracheal tube anesthesia and for patient monitoring during the procedure. Interventional neuro radiology nursing staff was also present. PROCEDURE: After the anesthesia team initiated general anesthesia, a time-out was performed. The patient is prepped and draped in the usual sterile fashion, room 2 in the neuro IR suite. Ultrasound survey of the right inguinal region was performed with images stored and sent to PACs. 11 blade scalpel was used to make a small incision. Blunt dissection was performed with ultrasound guidance. A micropuncture needle was used access the right common femoral artery under ultrasound. With excellent arterial blood flow returned, an .018 micro wire was passed through the needle, observed to enter the abdominal aorta under fluoroscopy. The needle was removed,  and a micropuncture sheath was placed over the wire. The inner dilator and wire were removed, and an 035 Bentson wire was advanced under fluoroscopy into the abdominal aorta. The sheath was removed and a standard 5 [place] vascular sheath was placed. The dilator was removed and the sheath was flushed. A 56F JB-1 diagnostic catheter was advanced over the wire to the proximal descending thoracic aorta. Wire was then removed. Double flush of the catheter was performed. We then proceeded with cerebral angiogram. Images were reviewed. We then  proceeded with our treatment plan. 3000 units of IV heparin was administered. The JB 1 catheter was used to select the innominate artery, directed then into the right common carotid artery. With an adequate catheter position in the common carotid artery, roadmap angiogram was performed. The catheter was then navigated with the assistance of a Glidewire into the external carotid artery branches. Wire was removed and a rosen wire was placed. Catheter was removed on the Rosen wire. The 5 French sheath was then removed and exchanged for 8 [place] Pinnacle sheath. Sheath was flushed and attached to pressurized and heparinized saline bag for constant forward flow. Then an 8 [place], 85 cm Emboguard balloon tip catheter was prepared on the back table with inflation of the balloon with 50/50 concentration of dilute contrast. The balloon catheter was then advanced over the wire, positioned into the distal right common carotid artery. Copious back flush was performed and the balloon catheter was attached to heparinized and pressurized saline bag for forward flow. Angiogram was then performed. After review of the images and measurements, we elected to proceed with distal embolic protection using Nav 6 device, 4.51m size. We selected an 8-6 x 40 mm Xact stent, with a plan for pre-dilation 4 mm x 30 mm balloon angioplasty with a Viatrac balloon. These devices were prepped on the back table,  including the retrieval device of the Nav 6. Confirmation angiogram was performed for road map guide. The bare wire was advanced through the stenotic region of the ICA, into a distal position within the cervical ICA. The Nav-6 device was then advanced into a safe location above the carotid lesion with deployment of the Nav 6. Timer was started, and a gentle infusion of contrast confirmed forward flow. The deployment device was removed on the bare wire and was disposed. 4 mm balloon angioplasty was then performed. Nine atmospheres was observed. The patient remained hemodynamically stable throughout this maneuver. Balloon was removed and disposed. The 8-6 x 40 mm Xact stent was then deployed at the lesion with the deployment device removed from the bare wire and disposed. Angiogram was performed. The retrieval device was then passed over the bare wire and the Nav 6 embolic protection device was retrieved. The entire unit was removed from the balloon guide catheter under fluoroscopic observation as this traversed the new stent. We stopped the clock, 8 minutes and 7 seconds of embolic protection open time. Angiogram was performed. We then observed 5 minutes interval and a final angiogram was performed. Slight spasm was present at the margin of the stent, with expected resolution and no evidence of high-grade stenosis secondary to the spasm. The 55 centimeter 8 French sheath was exchanged for a 7 [place]heath which was ultimately removed with deployment of 7 [place]ELT device. Patient tolerated the procedure well and remained hemodynamically stable throughout. No complications were encountered and no significant blood loss encountered. FINDINGS: Initial Findings: Right common carotid artery:  Normal course caliber and contour. Right external carotid artery: Patent with antegrade flow. Right internal carotid artery: Recurrent irregular plaque at the right carotid bifurcation, with eccentric plaque narrowing the lumen  84% stenosis by NASCET criteria. Fairly straight segment beyond with flow into the cerebral vasculature. Completion Findings: Right MCA: After balloon angioplasty and stenting of the cervical ICA, there is 20% stenosis. Mild spasm just beyond the stent margin, non flow limiting. Initial Findings: Left common carotid artery:  Normal course caliber and contour. Left external carotid artery: Patent with antegrade flow. Left internal carotid  artery: Irregular plaque at the proximal right cervical ICA on the left, estimated length 26 mm at the level of C2-C3. There is irregular surface/lumen of the plaque just beyond the bifurcation. Estimated degree of narrowing 71%. Fairly straight course after the plaque proximally. Left-sided cerebral vasculature was not evaluated given the patient's baseline renal function. Right vertebral artery: Patency of the right vertebral artery with regular course caliber and contour. No high-grade stenosis identified. Basilar artery patent. IMPRESSION: Status post ultrasound guided access right common femoral artery for cervical/cerebral angiogram and right cervical ICA stenting for high-grade symptomatic stenosis. CELT deployed for hemostasis. Signed, Dulcy Fanny. Dellia Nims, RPVI [facility] Electronically Signed   By: Corrie Mckusick D.O.   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  2 g Ancef ANESTHESIA/SEDATION: The anesthesia team was present to provide general endotracheal tube anesthesia and for patient monitoring during  the procedure. Intubation was performed in room 2. interventional neuro radiology nursing staff was also present. FLUOROSCOPY TIME:  Fluoroscopy Time: 12 minutes 54 seconds (590 mGy). COMPLICATIONS: None TECHNIQUE: Informed written consent was obtained from the patient after a thorough discussion of the procedural risks, benefits and alternatives. Specific risks discussed include: Bleeding, infection, contrast reaction, kidney injury/failure, need for further procedure/surgery, arterial injury or dissection, embolization to new territory, intracranial hemorrhage (10-15% risk), neurologic deterioration, cardiopulmonary collapse, death. All questions were addressed. Maximal Sterile Barrier Technique was utilized including during the procedure including caps, mask, sterile gowns, sterile gloves, sterile drape, hand hygiene and skin antiseptic. A timeout was performed prior to the initiation of the procedure. The anesthesia team was present to provide general endotracheal tube anesthesia and for patient monitoring during the procedure. Interventional neuro radiology nursing staff was also present. PROCEDURE: After the anesthesia team initiated general anesthesia, a time-out was performed. The patient is prepped and draped in the usual sterile fashion, room 2 in the neuro IR suite. Ultrasound survey of the right inguinal region was performed with images stored and sent to PACs. 11 blade scalpel was used to make a small incision. Blunt dissection was performed with ultrasound guidance. A micropuncture needle was used access the right common femoral artery under ultrasound. With excellent arterial blood flow returned, an .018 micro wire was passed through the needle, observed to enter the abdominal aorta under fluoroscopy. The needle was removed, and a micropuncture sheath was placed over the wire. The inner dilator and wire were removed, and an 035 Bentson wire was advanced under fluoroscopy into the abdominal aorta. The  sheath was removed and a standard 5 [place] vascular sheath was placed. The dilator was removed and the sheath was flushed. A 63F JB-1 diagnostic catheter was advanced over the wire to the proximal descending thoracic aorta. Wire was then removed. Double flush of the catheter was performed. We then proceeded with cerebral angiogram. Images were reviewed. We then proceeded with our treatment plan. 3000 units of IV heparin was administered. The JB 1 catheter was used to select the innominate artery, directed then into the right common carotid artery. With an adequate catheter position in the common carotid artery, roadmap angiogram was performed. The catheter was then  navigated with the assistance of a Glidewire into the external carotid artery branches. Wire was removed and a rosen wire was placed. Catheter was removed on the Rosen wire. The 5 French sheath was then removed and exchanged for 8 [place] Pinnacle sheath. Sheath was flushed and attached to pressurized and heparinized saline bag for constant forward flow. Then an 8 [place], 85 cm Emboguard balloon tip catheter was prepared on the back table with inflation of the balloon with 50/50 concentration of dilute contrast. The balloon catheter was then advanced over the wire, positioned into the distal right common carotid artery. Copious back flush was performed and the balloon catheter was attached to heparinized and pressurized saline bag for forward flow. Angiogram was then performed. After review of the images and measurements, we elected to proceed with distal embolic protection using Nav 6 device, 4.62m size. We selected an 8-6 x 40 mm Xact stent, with a plan for pre-dilation 4 mm x 30 mm balloon angioplasty with a Viatrac balloon. These devices were prepped on the back table, including the retrieval device of the Nav 6. Confirmation angiogram was performed for road map guide. The bare wire was advanced through the stenotic region of the ICA, into a distal  position within the cervical ICA. The Nav-6 device was then advanced into a safe location above the carotid lesion with deployment of the Nav 6. Timer was started, and a gentle infusion of contrast confirmed forward flow. The deployment device was removed on the bare wire and was disposed. 4 mm balloon angioplasty was then performed. Nine atmospheres was observed. The patient remained hemodynamically stable throughout this maneuver. Balloon was removed and disposed. The 8-6 x 40 mm Xact stent was then deployed at the lesion with the deployment device removed from the bare wire and disposed. Angiogram was performed. The retrieval device was then passed over the bare wire and the Nav 6 embolic protection device was retrieved. The entire unit was removed from the balloon guide catheter under fluoroscopic observation as this traversed the new stent. We stopped the clock, 8 minutes and 7 seconds of embolic protection open time. Angiogram was performed. We then observed 5 minutes interval and a final angiogram was performed. Slight spasm was present at the margin of the stent, with expected resolution and no evidence of high-grade stenosis secondary to the spasm. The 55 centimeter 8 French sheath was exchanged for a 7 [place]ath which was ultimately removed with deployment of 7 [place]ELT device. Patient tolerated the procedure well and remained hemodynamically stable throughout. No complications were encountered and no significant blood loss encountered. FINDINGS: Initial Findings: Right common carotid artery:  Normal course caliber and contour. Right external carotid artery: Patent with antegrade flow. Right internal carotid artery: Recurrent irregular plaque at the right carotid bifurcation, with eccentric plaque narrowing the lumen 84% stenosis by NASCET criteria. Fairly straight segment beyond with flow into the cerebral vasculature. Completion Findings: Right MCA: After balloon angioplasty and stenting of the  cervical ICA, there is 20% stenosis. Mild spasm just beyond the stent margin, non flow limiting. Initial Findings: Left common carotid artery:  Normal course caliber and contour. Left external carotid artery: Patent with antegrade flow. Left internal carotid artery: Irregular plaque at the proximal right cervical ICA on the left, estimated length 26 mm at the level of C2-C3. There is irregular surface/lumen of the plaque just beyond the bifurcation. Estimated degree of narrowing 71%. Fairly straight course after the plaque proximally. Left-sided cerebral vasculature was not  evaluated given the patient's baseline renal function. Right vertebral artery: Patency of the right vertebral artery with regular course caliber and contour. No high-grade stenosis identified. Basilar artery patent. IMPRESSION: Status post ultrasound guided access right common femoral artery for cervical/cerebral angiogram and right cervical ICA stenting for high-grade symptomatic stenosis. CELT deployed for hemostasis. Signed, Dulcy Fanny. Dellia Nims, RPVI Vascular and Interventional Radiology Specialists [facility] Electronically Signed   By: Corrie Mckusick D.O.   On: 06/21/2020 12:34    Treatments: Endovascular revascularization of high-grade symptomatic right ICA stenosis.  Discharge Exam: Blood pressure (!) 142/76, pulse 75, temperature 98.1 F (36.7 C), temperature source Oral, resp. rate 14, height '5\' 6"'$  (1.676 m), weight 130 lb (59 kg), SpO2 96 %. Physical Exam Vitals and nursing note reviewed.  Constitutional:      General: He is not in acute distress. Cardiovascular:     Rate and Rhythm: Normal rate and regular rhythm.     Heart sounds: Normal heart sounds. No murmur heard.   Pulmonary:     Effort: Pulmonary effort is normal. No respiratory distress.     Breath sounds: Normal breath sounds. No wheezing.  Skin:    General: Skin is warm and dry.     Comments: Right femoral puncture site soft without active  bleeding or hematoma.  Neurological:     Mental Status: He is alert.     Comments: Alert, awake, and oriented x3. Speech and comprehension intact. PERRL bilaterally. EOMs intact bilaterally without nystagmus or subjective diplopia. Mild left facial asymmetry. Tongue midline. Can spontaneously move all extremities. Distal pulses (DPs) palpable bilaterally.     Disposition: Discharge disposition: 01-Home or Self Care       Discharge Instructions    Call MD for:  difficulty breathing, headache or visual disturbances   Complete by: As directed    Call MD for:  extreme fatigue   Complete by: As directed    Call MD for:  hives   Complete by: As directed    Call MD for:  persistant dizziness or light-headedness   Complete by: As directed    Call MD for:  persistant nausea and vomiting   Complete by: As directed    Call MD for:  redness, tenderness, or signs of infection (pain, swelling, redness, odor or green/yellow discharge around incision site)   Complete by: As directed    Call MD for:  severe uncontrolled pain   Complete by: As directed    Call MD for:  temperature >100.4   Complete by: As directed    Diet - low sodium heart healthy   Complete by: As directed    Discharge instructions   Complete by: As directed    Ok to shower 48 hours after procedure. Recommend showering with bandage on right groin, remove bandage immediately after showering and pat area dry. No further dressing changes needed after this- ensure area remains clean/dry until fully healed. No submerging (swimming, bathing) for 7 days post-procedure.   Increase activity slowly   Complete by: As directed    Lifting restrictions   Complete by: As directed    No stooping, bending, or lifting more than 10 pounds for 2 weeks.   Remove dressing in 24 hours   Complete by: As directed    Following first shower (see showering instructions for more information on this). No further dressing changes needed after  this- ensure area remains clean/dry until fully healed.     Allergies as of 06/22/2020  No Known Allergies     Medication List    TAKE these medications   acetaminophen 325 MG tablet Commonly known as: TYLENOL Take 2 tablets (650 mg total) by mouth every 4 (four) hours as needed for mild pain (or temp > 37.5 C (99.5 F)).   albuterol 108 (90 Base) MCG/ACT inhaler Commonly known as: VENTOLIN HFA Inhale 1 puff into the lungs every 6 (six) hours as needed for wheezing or shortness of breath.   amLODipine 10 MG tablet Commonly known as: NORVASC Take 1 tablet (10 mg total) by mouth daily.   aspirin EC 81 MG tablet Take 1 tablet (81 mg total) by mouth daily. Swallow whole. What changed:   medication strength  how much to take  additional instructions   atorvastatin 40 MG tablet Commonly known as: LIPITOR Take 1 tablet (40 mg total) by mouth daily.   clopidogrel 75 MG tablet Commonly known as: PLAVIX Take 1 tablet (75 mg total) by mouth daily.   diclofenac Sodium 1 % Gel Commonly known as: VOLTAREN Apply 2 g topically 4 (four) times daily.   docusate sodium 100 MG capsule Commonly known as: COLACE Take 1 capsule (100 mg total) by mouth 2 (two) times daily.   escitalopram 10 MG tablet Commonly known as: LEXAPRO Take 1 tablet (10 mg total) by mouth daily.   famotidine 20 MG tablet Commonly known as: PEPCID Take 1 tablet (20 mg total) by mouth daily.   glimepiride 2 MG tablet Commonly known as: AMARYL Take 1.5 tablets (3 mg total) by mouth daily with breakfast.   hydrALAZINE 50 MG tablet Commonly known as: APRESOLINE Take 1 tablet (50 mg total) by mouth 3 (three) times daily.   lidocaine 5 % Commonly known as: LIDODERM Place 1 patch onto the skin daily. Remove & Discard patch within 12 hours or as directed by MD   multivitamin with minerals Tabs tablet Take 1 tablet by mouth daily.   nicotine 7 mg/24hr patch Commonly known as: NICODERM CQ - dosed in mg/24  hr Place 1 patch (7 mg total) onto the skin daily.   polyethylene glycol 17 g packet Commonly known as: MIRALAX / GLYCOLAX Take 17 g by mouth daily.   tiZANidine 2 MG tablet Commonly known as: ZANAFLEX Take 1 tablet (2 mg total) by mouth 3 (three) times daily.       Follow-up Information    Corrie Mckusick, DO Follow up in 4 week(s).   Specialties: Interventional Radiology, Radiology Why: Please follow-up with Dr. Earleen Newport in clinic 4 weeks after discharge. Our schedulers will call you to set up this appointment. Contact information: [place] [address] [phone]                Electronically Signed: Earley Abide, PA-C 06/22/2020, 9:08 AM   I have spent Less Than 30 Minutes discharging Bobby Branch.

## 2020-06-25 ENCOUNTER — Other Ambulatory Visit (HOSPITAL_COMMUNITY): Payer: Self-pay

## 2020-06-25 ENCOUNTER — Telehealth (HOSPITAL_COMMUNITY): Payer: Self-pay

## 2020-06-25 ENCOUNTER — Other Ambulatory Visit (HOSPITAL_COMMUNITY): Payer: Self-pay | Admitting: Physician Assistant

## 2020-06-25 NOTE — Telephone Encounter (Signed)
Pharmacy Transitions of Care Follow-up Telephone Call  Date of discharge: 06/22/20 Discharge Diagnosis: CA Stenosis  How have you been since you were released from the hospital?Pt states doing well and improving.   Medication changes made at discharge:  - START: none  - STOPPED:none  - CHANGED: none  Medication changes verified by the patient? Yes    Medication Accessibility:  Home Pharmacy: [facility]  Was the patient provided with refills on discharged medications? Yes  Have all prescriptions been transferred from [facility] to home pharmacy? Yes  Is the patient able to afford medications? Yes . Notable copays:  . Eligible patient assistance:     Medication Review:\ Pt has already been on Plavix. He went in for a procedure and it went well. He requested a refill on Voltaren gel and I sent it to his Dr. I offered mail for his rxs and he accepted so I gathered the insurance and payment information.   Follow-up Appointments:  Damita Dunnings in 4 weeks   If their condition worsens, is the pt aware to call PCP or go to the Emergency Dept.? yes  Final Patient Assessment:  Pt is doing well. See above.

## 2020-06-26 ENCOUNTER — Other Ambulatory Visit (HOSPITAL_COMMUNITY): Payer: Self-pay

## 2020-07-03 ENCOUNTER — Other Ambulatory Visit: Payer: Self-pay

## 2020-07-03 ENCOUNTER — Encounter (HOSPITAL_BASED_OUTPATIENT_CLINIC_OR_DEPARTMENT_OTHER): Payer: Federal, State, Local not specified - PPO | Admitting: Registered Nurse

## 2020-07-03 ENCOUNTER — Encounter: Payer: Self-pay | Admitting: Registered Nurse

## 2020-07-03 ENCOUNTER — Other Ambulatory Visit (HOSPITAL_COMMUNITY): Payer: Self-pay

## 2020-07-03 VITALS — BP 130/75 | HR 79 | Temp 98.7°F | Ht 66.0 in | Wt 130.6 lb

## 2020-07-03 DIAGNOSIS — G8110 Spastic hemiplegia affecting unspecified side: Secondary | ICD-10-CM | POA: Diagnosis present

## 2020-07-03 DIAGNOSIS — I10 Essential (primary) hypertension: Secondary | ICD-10-CM | POA: Diagnosis present

## 2020-07-03 DIAGNOSIS — E1165 Type 2 diabetes mellitus with hyperglycemia: Secondary | ICD-10-CM | POA: Diagnosis present

## 2020-07-03 DIAGNOSIS — I63511 Cerebral infarction due to unspecified occlusion or stenosis of right middle cerebral artery: Secondary | ICD-10-CM

## 2020-07-03 DIAGNOSIS — F1910 Other psychoactive substance abuse, uncomplicated: Secondary | ICD-10-CM

## 2020-07-03 NOTE — Progress Notes (Signed)
Subjective:    Patient ID: Bobby Branch, male    DOB: 11/16/1960, 60 y.o.   MRN: [id number]  HPI: Bobby Branch is a 60 y.o. male who is her for hospital follow up appointment of his  Right Middle Cerebral artery stroke, Spastic hemiplegia affecting nondominant side, Essential Hypertension, Controlled Type 2 DM with hyperglycemia, without long term current use of insulin and Polysubstance Abuse.  He was brought to [facility] on 04/20/2020, family found patient down, he had acute onset of left-sided weakness. Neurology Following.   CT: Head:  IMPRESSION: 1. Asymmetric hyperdensity involving right M2/M3 branches, concerning for thrombus. Further evaluation with dedicated CTA recommended. No intracranial hemorrhage. 2. ASPECTS is 10. 3. Few remote lacunar infarcts about the bilateral basal ganglia.  CTA: Head and Neck:  IMPRESSION: CTA HEAD AND NECK IMPRESSION:  1. Positive CTA for with abrupt occlusion of the proximal right ICA just beyond the right bifurcation. Distal reconstitution at the cavernous segment via collateral flow across the circle-of-Willis. Subsequent downstream occlusion at a distal right M2/M3 branch, inferior division. 2. 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 3. Additional mild atheromatous change elsewhere about the major arterial vasculature of the head and neck. No other high-grade or correctable stenosis.  CT PERFUSION IMPRESSION:  Acute 15 cc core infarct involving the posterior right MCA distribution with 67 cc surrounding ischemic penumbra.  He underwent revascularization via Intervention Radiology:  IMPRESSION: Status post ultrasound guided access right common femoral artery for bilateral cervical/cerebral angiogram, and treatment of right-sided cervical ICA/MCA tandem occlusion, treating the cervical ICA occlusion with 4 mm balloon angioplasty, and restoring TICI 3 perfusion  to the inferior division M2 branch with 2 passes of combination stent retriever and direct aspiration.   Per Dr Erlinda Hong Note: Right MCA stroke with right M2/3 occlusion and right ICA occusions/p TPAandthrombectomy achieving a TICI 3right MCArevascularization, likely due to large vessel disease source.   UDS was Positive for cocaine and marijuana, Bobby Branch reports no Drug use since discharge. Educated on illicit drug use, he verbalizes understanding.   Bobby Branch was admitted to inpatient rehabilitation on 04/23/2020 and discharged home on 05/22/2020. He didn't receive home health therapy, he has insurance. Referral place for outpatient therapy. He states he has pain in his left hand and left shoulder. He rates his pain 4.   Pain Inventory Average Pain 4 Pain Right Now 4 My pain is constant, sharp and aching  LOCATION OF PAIN  Left hand and shoulder BOWEL Number of stools per week: 4-5 Oral laxative use No  Type of laxative none Enema or suppository use No  History of colostomy No  Incontinent No   BLADDER Normal In and out cath, frequency n/a Able to self cath No  Bladder incontinence No  Frequent urination Yes  Leakage with coughing No  Difficulty starting stream Yes  Incomplete bladder emptying No    Mobility use a walker how many minutes can you walk?  2 minutes ability to climb steps?  yes do you drive?  no use a wheelchair transfers alone Do you have any goals in this area?  yes  Function disabled: date disabled 2021  Neuro/Psych weakness tingling trouble walking spasms depression anxiety  Prior Studies Any changes since last visit?  no New Patient  Physicians involved in your care Any changes since last visit?  no New Patient   History reviewed. No pertinent family history. Social History   Socioeconomic History  .  Marital status: Single    Spouse name: Not on file  . Number of children: Not on file  . Years of education: Not on file   . Highest education level: Not on file  Occupational History  . Not on file  Tobacco Use  . Smoking status: Current Every Day Smoker    Types: Cigarettes  . Smokeless tobacco: Never Used  . Tobacco comment: patient trying to quit  Vaping Use  . Vaping Use: Never used  Substance and Sexual Activity  . Alcohol use: Yes    Comment: beer  . Drug use: Yes    Types: Cocaine, Marijuana    Comment: Last use in 04/2020  . Sexual activity: Yes    Partners: Female  Other Topics Concern  . Not on file  Social History Narrative  . Not on file   Social Determinants of Health   Financial Resource Strain: Not on file  Food Insecurity: Not on file  Transportation Needs: Not on file  Physical Activity: Not on file  Stress: Not on file  Social Connections: Not on file   Past Surgical History:  Procedure Laterality Date  . EYE SURGERY Left   . IR ANGIO EXTRACRAN SEL COM CAROTID INNOMINATE UNI L MOD SED  04/20/2020  . IR ANGIO INTRA EXTRACRAN SEL COM CAROTID INNOMINATE UNI L MOD SED  06/21/2020  . IR ANGIO VERTEBRAL SEL SUBCLAVIAN INNOMINATE UNI R MOD SED  06/21/2020  . IR CT HEAD LTD  04/20/2020  . IR INTRAVSC STENT CERV CAROTID W/EMB-PROT MOD SED INCL ANGIO  06/21/2020  . IR PERCUTANEOUS ART THROMBECTOMY/INFUSION INTRACRANIAL INC DIAG ANGIO  04/20/2020  . IR RADIOLOGIST EVAL & MGMT  06/05/2020  . IR US GUIDE VASC ACCESS RIGHT  04/20/2020  . IR US GUIDE VASC ACCESS RIGHT  06/21/2020  . RADIOLOGY WITH ANESTHESIA N/A 04/20/2020   Procedure: IR WITH ANESTHESIA;  Surgeon: Luanne Bras, MD;  Location: [place];  Service: Radiology;  Laterality: N/A;  . RADIOLOGY WITH ANESTHESIA N/A 06/21/2020   Procedure: RADIOLOGY WITH ANESTHESIA  STENTING;  Surgeon: Corrie Mckusick, DO;  Location: [place];  Service: Anesthesiology;  Laterality: N/A;   Past Medical History:  Diagnosis Date  . Anxiety   . Depression   . Diabetes mellitus without complication ([place])    type 2  . Dyspnea    inhaler  . GERD  (gastroesophageal reflux disease)   . HLD (hyperlipidemia)   . Hypertension   . Stroke ([place]) 09/2019   BP 130/75   Pulse 79   Temp 98.7 F (37.1 C)   Ht '5\' 6"'$  (1.676 m)   Wt 130 lb 9.6 oz (59.2 kg)   SpO2 97%   BMI 21.08 kg/m   Opioid Risk Score:   Fall Risk Score:  `1  Depression screen PHQ 2/9  Depression screen PHQ 2/9 07/03/2020  Decreased Interest 0  Down, Depressed, Hopeless 1  PHQ - 2 Score 1  Altered sleeping 3  Tired, decreased energy 1  Change in appetite 0  Feeling bad or failure about yourself  1  Trouble concentrating 0  Moving slowly or fidgety/restless 1  Suicidal thoughts 0  PHQ-9 Score 7   Review of Systems  Musculoskeletal: Positive for gait problem.       Left arm pain, left hand pain & swelling  All other systems reviewed and are negative.      Objective:   Physical Exam Vitals and nursing note reviewed.  Constitutional:  Appearance: Normal appearance.  Musculoskeletal:     Cervical back: Normal range of motion and neck supple.     Comments: Normal Muscle Bulk and Muscle Testing Reveals:  Upper Extremities: Right: Full ROM and Muscle Strength 5/5 Left Upper Extremity: Decreased ROM and Muscle Strength 0/5 Lower Extremities : Right: Full ROM and Muscle Strength 5/5 Left Lower Extremity: Decreased ROM and Muscle Strength 4/5 Arrived in wheelchair  Neurological:     Mental Status: He is alert.           Assessment & Plan:  1.Right Middle Cerebral artery stroke/ Spastic hemiplegia affecting nondominant side: RX: Referral place for Outpatient Therapy. Has a schedule appointment with Neurology.  2. Essential Hypertension: Continue current medication Regimen: PCP Following.  3.  Controlled Type 2 DM with hyperglycemia, without long term current use of insulin: Continue current medication regimen. PCP Following.  2. Polysubstance Abuse. Mr. Simone denies illicit drug use. Counseling provided, he verbalizes understanding.   F/Uin 4- 6  weeks with Dr Letta Pate.

## 2020-07-04 ENCOUNTER — Ambulatory Visit: Payer: Federal, State, Local not specified - PPO | Attending: Physician Assistant | Admitting: Physician Assistant

## 2020-07-04 ENCOUNTER — Other Ambulatory Visit: Payer: Self-pay

## 2020-07-04 ENCOUNTER — Other Ambulatory Visit (HOSPITAL_COMMUNITY): Payer: Self-pay

## 2020-07-04 ENCOUNTER — Encounter: Payer: Self-pay | Admitting: Physician Assistant

## 2020-07-04 DIAGNOSIS — I6523 Occlusion and stenosis of bilateral carotid arteries: Secondary | ICD-10-CM

## 2020-07-04 DIAGNOSIS — G8110 Spastic hemiplegia affecting unspecified side: Secondary | ICD-10-CM

## 2020-07-04 DIAGNOSIS — I63511 Cerebral infarction due to unspecified occlusion or stenosis of right middle cerebral artery: Secondary | ICD-10-CM

## 2020-07-04 DIAGNOSIS — I10 Essential (primary) hypertension: Secondary | ICD-10-CM | POA: Diagnosis not present

## 2020-07-04 DIAGNOSIS — E1165 Type 2 diabetes mellitus with hyperglycemia: Secondary | ICD-10-CM

## 2020-07-04 DIAGNOSIS — F32A Depression, unspecified: Secondary | ICD-10-CM

## 2020-07-04 MED ORDER — AMLODIPINE BESYLATE 10 MG PO TABS
10.0000 mg | ORAL_TABLET | Freq: Every day | ORAL | 3 refills | Status: DC
  Filled 2020-07-04 – 2020-07-16 (×2): qty 30, 30d supply, fill #0
  Filled 2020-08-17: qty 30, 30d supply, fill #1
  Filled 2020-10-02: qty 30, 30d supply, fill #2

## 2020-07-04 MED ORDER — GLIMEPIRIDE 2 MG PO TABS
3.0000 mg | ORAL_TABLET | Freq: Every day | ORAL | 2 refills | Status: DC
  Filled 2020-07-04 – 2020-07-16 (×2): qty 45, 30d supply, fill #0
  Filled 2020-08-17: qty 45, 30d supply, fill #1
  Filled 2020-10-02: qty 45, 30d supply, fill #2

## 2020-07-04 MED ORDER — HYDRALAZINE HCL 50 MG PO TABS
50.0000 mg | ORAL_TABLET | Freq: Three times a day (TID) | ORAL | 2 refills | Status: DC
  Filled 2020-07-04 – 2020-07-16 (×2): qty 90, 30d supply, fill #0
  Filled 2020-08-17: qty 90, 30d supply, fill #1
  Filled 2020-10-02: qty 90, 30d supply, fill #2

## 2020-07-04 MED ORDER — CLOPIDOGREL BISULFATE 75 MG PO TABS
75.0000 mg | ORAL_TABLET | Freq: Every day | ORAL | 3 refills | Status: DC
  Filled 2020-07-04: qty 30, 30d supply, fill #0

## 2020-07-04 MED ORDER — FAMOTIDINE 20 MG PO TABS
20.0000 mg | ORAL_TABLET | Freq: Every day | ORAL | 2 refills | Status: DC
  Filled 2020-07-04 – 2020-07-16 (×2): qty 30, 30d supply, fill #0
  Filled 2020-08-17: qty 30, 30d supply, fill #1
  Filled 2020-10-02: qty 30, 30d supply, fill #2

## 2020-07-04 MED ORDER — ALBUTEROL SULFATE HFA 108 (90 BASE) MCG/ACT IN AERS
1.0000 | INHALATION_SPRAY | Freq: Four times a day (QID) | RESPIRATORY_TRACT | 0 refills | Status: DC | PRN
  Filled 2020-07-04 – 2020-07-16 (×2): qty 8.5, 25d supply, fill #0

## 2020-07-04 MED ORDER — ESCITALOPRAM OXALATE 10 MG PO TABS
10.0000 mg | ORAL_TABLET | Freq: Every day | ORAL | 3 refills | Status: DC
  Filled 2020-07-04 – 2020-07-16 (×2): qty 30, 30d supply, fill #0
  Filled 2020-08-17: qty 30, 30d supply, fill #1

## 2020-07-04 MED ORDER — ATORVASTATIN CALCIUM 40 MG PO TABS
40.0000 mg | ORAL_TABLET | Freq: Every day | ORAL | 3 refills | Status: DC
Start: 2020-07-04 — End: 2021-02-04
  Filled 2020-07-04 – 2020-07-16 (×2): qty 30, 30d supply, fill #0
  Filled 2020-08-17: qty 30, 30d supply, fill #1
  Filled 2020-10-02: qty 30, 30d supply, fill #2

## 2020-07-04 NOTE — Progress Notes (Signed)
Patient ID: Bobby Branch, male   DOB: September 28, 1960, 60 y.o.   MRN: [id number]   Virtual Visit via Telephone Note  I connected with Bobby Branch on 07/04/20 at  8:30 AM EDT by telephone and verified that I am speaking with the correct person using two identifiers.  Location: Patient: home Provider: [facility] office   I discussed the limitations, risks, security and privacy concerns of performing an evaluation and management service by telephone and the availability of in person appointments. I also discussed with the patient that there may be a patient responsible charge related to this service. The patient expressed understanding and agreed to proceed.   History of Present Illness: After hospitalization 3/21-4/19/2022 for stroke then readmission 5/19-5/20/2022 for carotid endarterectomy.  He is doing well.  He is able to do most ADL.  He has f/up appts with Dr Letta Pate and Dr Leonie Man.  Blood sugars running from 110-140.  He denies vision changes/CP/HA/SOB.  Needs to have a PCP.     Discharge Diagnoses:  Principal Problem:   Right middle cerebral artery stroke [facility]) Active Problems:   Protein-calorie malnutrition, severe   Slow transit constipation   Spastic hemiplegia affecting nondominant side (HCC)   Stage 3b chronic kidney disease ([place])   Essential hypertension   Controlled type 2 diabetes mellitus with hyperglycemia, without long-term current use of insulin (HCC) Hyperlipidemia Polysubstance abuse Slow transit constipation  Family history.  Positive for hypertension as well as hyperlipidemia.  Denies any colon cancer esophageal cancer or rectal cancer  Brief HPI:   Bobby Branch is a 60 y.o. right-handed male with history of CVA August 2021 with very mild residual deficits maintained on aspirin and Plavix, diabetes mellitus, CKD stage III, hypertension and hyperlipidemia.  Per chart review lives with girlfriend and modified independent.  Presented 04/20/2020 with acute onset of left-sided  weakness.  Cranial CT scan showed asymmetric hyperdensity involving the right M2/M3 branches concerning for thrombus.  Few remote lacunar infarcts about the bilateral basal ganglia.  CT angiogram of head and neck as well as CT cerebral perfusion scan showed acute 15 cc core infarct involving the posterior right MCA distribution with a 67 cc surrounding ischemic penumbra.  Patient underwent revascularization per interventional radiology.  MRI follow-up showed right MCA patchy small acute infarct with possible petechial hemorrhage.  Carotid Dopplers with right 60 to 79% left ICA and 80 to 99%.  Echocardiogram with ejection fraction of 55 to 60% no wall motion abnormalities grade 1 diastolic dysfunction.  Admission chemistries glucose 231 creatinine 2.10 hemoglobin 13.6 hemoglobin A1c 7.3 urine drug screen positive cocaine as well as marijuana.  Initially maintained on Cleviprex for blood pressure control.  Neurology follow-up maintained on aspirin 325 mg daily and Plavix 75 mg daily x3 months.  In regards to patient's bilateral carotid stenosis Dr. Earleen Newport of interventional radiology to follow-up outpatient for potential revascularization treatment.  He was tolerating a regular consistency diet.  Therapy evaluations completed due to patient's left-sided weakness recommendations of physical medicine rehab consult and patient was admitted for a comprehensive rehab program.   Hospital Course: Bobby Branch was admitted to rehab 04/23/2020 for inpatient therapies to consist of PT, ST and OT at least three hours five days a week. Past admission physiatrist, therapy team and rehab RN have worked together to provide customized collaborative inpatient rehab.  Pertaining to patient's right MCA distribution infarct with right M2/3 occlusion status post thrombectomy revascularization interventional radiology.  Remained on aspirin 325 mg daily and Plavix 75 mg  day x3 months.  Work-up findings of bilateral carotid stenosis plan  outpatient follow-up with interventional radiology to discuss intervention as outpatient.  Pain management use of Voltaren gel as well as a Lidoderm patch.  Mood stabilization with Lexapro emotional support provided he was attending full therapies and receive follow-up by neuropsychology.  Blood pressures controlled on Norvasc as well as hydralazine monitor outpatient.  Diabetes mellitus hemoglobin A1c 7.3 started on Amaryl 3 mg daily and monitored with full diabetic teaching.  Spasticity related to CVA Zanaflex 2 mg 3 times daily.  He did have a history of tobacco abuse NicoDerm patch initiated as well as urine drug screen positive marijuana cocaine with patient received counseling in regards to cessation of nicotine products as well as illicit drug use it was questionable if he would be compliant with these request.  Lipitor ongoing for hyperlipidemia.    Observations/Objective:  NAD.  A&Ox3   Assessment and Plan: 1. Acute right MCA stroke (HCC) - clopidogrel (PLAVIX) 75 MG tablet; Take 1 tablet (75 mg total) by mouth daily.  Dispense: 30 tablet; Refill: 3 - atorvastatin (LIPITOR) 40 MG tablet; Take 1 tablet (40 mg total) by mouth daily.  Dispense: 30 tablet; Refill: 3  2. Essential hypertension-BP yesterday was 130/75 - amLODipine (NORVASC) 10 MG tablet; Take 1 tablet (10 mg total) by mouth daily.  Dispense: 30 tablet; Refill: 3 - hydrALAZINE (APRESOLINE) 50 MG tablet; Take 1 tablet (50 mg total) by mouth 3 (three) times daily.  Dispense: 90 tablet; Refill: 2  3. Controlled type 2 diabetes mellitus with hyperglycemia, without long-term current use of insulin (HCC) - glimepiride (AMARYL) 2 MG tablet; Take 1.5 tablets (3 mg total) by mouth daily with breakfast.  Dispense: 45 tablet; Refill: 2   4. Internal carotid artery stenosis, bilateral - clopidogrel (PLAVIX) 75 MG tablet; Take 1 tablet (75 mg total) by mouth daily.  Dispense: 30 tablet; Refill: 3 - atorvastatin (LIPITOR) 40 MG tablet; Take  1 tablet (40 mg total) by mouth daily.  Dispense: 30 tablet; Refill: 3  5. Spastic hemiplegia affecting nondominant side ([place])  6. Depression, unspecified depression type stable - escitalopram (LEXAPRO) 10 MG tablet; Take 1 tablet (10 mg total) by mouth daily.  Dispense: 30 tablet; Refill: 3    Follow Up Instructions: Assign PCP in our office in about 6 weeks please   I discussed the assessment and treatment plan with the patient. The patient was provided an opportunity to ask questions and all were answered. The patient agreed with the plan and demonstrated an understanding of the instructions.   The patient was advised to call back or seek an in-person evaluation if the symptoms worsen or if the condition fails to improve as anticipated.  I provided 16 minutes of non-face-to-face time during this encounter.   Freeman Caldron, PA-C

## 2020-07-06 ENCOUNTER — Other Ambulatory Visit (HOSPITAL_COMMUNITY): Payer: Self-pay

## 2020-07-09 ENCOUNTER — Other Ambulatory Visit (HOSPITAL_COMMUNITY): Payer: Self-pay

## 2020-07-10 ENCOUNTER — Other Ambulatory Visit: Payer: Self-pay

## 2020-07-12 ENCOUNTER — Other Ambulatory Visit: Payer: Self-pay

## 2020-07-12 ENCOUNTER — Ambulatory Visit: Payer: Federal, State, Local not specified - PPO | Attending: Registered Nurse

## 2020-07-12 DIAGNOSIS — M25642 Stiffness of left hand, not elsewhere classified: Secondary | ICD-10-CM | POA: Diagnosis present

## 2020-07-12 DIAGNOSIS — M25612 Stiffness of left shoulder, not elsewhere classified: Secondary | ICD-10-CM | POA: Diagnosis present

## 2020-07-12 DIAGNOSIS — G8929 Other chronic pain: Secondary | ICD-10-CM | POA: Insufficient documentation

## 2020-07-12 DIAGNOSIS — R2681 Unsteadiness on feet: Secondary | ICD-10-CM

## 2020-07-12 DIAGNOSIS — R41840 Attention and concentration deficit: Secondary | ICD-10-CM | POA: Diagnosis present

## 2020-07-12 DIAGNOSIS — M25512 Pain in left shoulder: Secondary | ICD-10-CM | POA: Diagnosis present

## 2020-07-12 DIAGNOSIS — I69354 Hemiplegia and hemiparesis following cerebral infarction affecting left non-dominant side: Secondary | ICD-10-CM | POA: Insufficient documentation

## 2020-07-12 DIAGNOSIS — I63511 Cerebral infarction due to unspecified occlusion or stenosis of right middle cerebral artery: Secondary | ICD-10-CM | POA: Diagnosis present

## 2020-07-12 DIAGNOSIS — I69351 Hemiplegia and hemiparesis following cerebral infarction affecting right dominant side: Secondary | ICD-10-CM | POA: Insufficient documentation

## 2020-07-12 NOTE — Therapy (Signed)
[facility] [address] [place], [place], [address] Phone: [phone]   Fax:  [phone]  Physical Therapy Evaluation  Patient Details  Name: Bobby Branch MRN: [id number] Date of Birth: 04/05/1960 Referring Provider (PT): Danella Sensing NP   Encounter Date: 07/12/2020   PT End of Session - 07/12/20 1634     Visit Number 1    Number of Visits 17    Date for PT Re-Evaluation 09/13/20    Authorization Type MCD pending    Progress Note Due on Visit 10    PT Start Time [id number]    PT Stop Time [id number]    PT Time Calculation (min) 45 min    Equipment Utilized During Treatment Gait belt             Past Medical History:  Diagnosis Date   Anxiety    Depression    Diabetes mellitus without complication ([place])    type 2   Dyspnea    inhaler   GERD (gastroesophageal reflux disease)    HLD (hyperlipidemia)    Hypertension    Stroke ([place]) 09/2019    Past Surgical History:  Procedure Laterality Date   EYE SURGERY Left    IR ANGIO EXTRACRAN SEL COM CAROTID INNOMINATE UNI L MOD SED  04/20/2020   IR ANGIO INTRA EXTRACRAN SEL COM CAROTID INNOMINATE UNI L MOD SED  06/21/2020   IR ANGIO VERTEBRAL SEL SUBCLAVIAN INNOMINATE UNI R MOD SED  06/21/2020   IR CT HEAD LTD  04/20/2020   IR INTRAVSC STENT CERV CAROTID W/EMB-PROT MOD SED INCL ANGIO  06/21/2020   IR PERCUTANEOUS ART THROMBECTOMY/INFUSION INTRACRANIAL INC DIAG ANGIO  04/20/2020   IR RADIOLOGIST EVAL & MGMT  06/05/2020   IR US GUIDE VASC ACCESS RIGHT  04/20/2020   IR US GUIDE VASC ACCESS RIGHT  06/21/2020   RADIOLOGY WITH ANESTHESIA N/A 04/20/2020   Procedure: IR WITH ANESTHESIA;  Surgeon: Luanne Bras, MD;  Location: [place];  Service: Radiology;  Laterality: N/A;   RADIOLOGY WITH ANESTHESIA N/A 06/21/2020   Procedure: RADIOLOGY WITH ANESTHESIA  STENTING;  Surgeon: Corrie Mckusick, DO;  Location: [place];  Service: Anesthesiology;  Laterality: N/A;    There were no vitals filed  for this visit.        [facility] PT Assessment - 07/12/20 0001       Assessment   Medical Diagnosis R MCA stroke    Referring Provider (PT) Danella Sensing NP    Onset Date/Surgical Date 04/20/20    Hand Dominance Right    Prior Therapy CIR      Precautions   Precautions Fall      Balance Screen   Has the patient fallen in the past 6 months Yes    How many times? 4    Has the patient had a decrease in activity level because of a fear of falling?  Yes    Is the patient reluctant to leave their home because of a fear of falling?  Yes      [place] residence    Living Arrangements Alone    Available Help at Discharge Friend(s)    Type of Home Apartment    Home Access Level entry    Home Equipment Wheelchair - manual      Prior Function   Level of Independence Independent with basic ADLs;Independent with household mobility with device      Sensation   Light Touch Appears Intact  Coordination   Heel Shin Test UTA      Posture/Postural Control   Posture Comments R trunk lean      ROM / Strength   AROM / PROM / Strength Strength      Strength   Overall Strength Deficits    Left Hip Flexion 3+/5    Left Hip Extension 3+/5    Left Hip ABduction 3/5    Left Hip ADduction 3/5    Left Knee Flexion 3+/5    Left Knee Extension 3+/5    Right/Left Ankle Left    Left Ankle Dorsiflexion 2/5    Left Ankle Plantar Flexion 2/5      Transfers   Transfers Sit to Stand;Stand to Sit    Sit to Stand 4: Min guard    Stand to Sit 4: Min guard    Comments Requires RUE support      Ambulation/Gait   Ambulation/Gait Yes    Ambulation/Gait Assistance 4: Min guard    Ambulation Distance (Feet) 115 Feet    Assistive device Hemi-walker    Gait Pattern Decreased step length - left;Step-through pattern;Decreased stance time - left;Decreased stride length;Decreased hip/knee flexion - left      Wheelchair Mobility   Wheelchair Mobility Yes                         Objective measurements completed on examination: See above findings.                 PT Short Term Goals - 07/12/20 1637       PT SHORT TERM GOAL #1   Title Patient to demo I in initial HEP    Baseline TBD    Time 4    Period Weeks    Status New    Target Date 09/13/20      PT SHORT TERM GOAL #2   Title Patient to transfer sit/stand under S only    Baseline SBA to perform sit/stand transfer.    Time 4    Period Weeks    Status New    Target Date 08/16/20      PT SHORT TERM GOAL #3   Title Patient to ambulate 252f with hemiwalker and S    Baseline 1131fwith hemiwalker and CGA    Time 4    Period Weeks    Status New    Target Date 08/16/20      PT SHORT TERM GOAL #4   Title Assess 5x STS time and set appropriate goal    Baseline TBD    Time 4    Period Weeks    Status New    Target Date 08/16/20      PT SHORT TERM GOAL #5   Title Assess FGA/DGI and set appropriate goal    Baseline TBD    Time 4    Period Weeks    Status New    Target Date 08/16/20               PT Long Term Goals - 07/12/20 1643       PT LONG TERM GOAL #1   Title Patient to ambulate 50090fith LRAD under S    Baseline 115f31fth hemiwalker and CGA    Time 8    Period Weeks    Status New    Target Date 09/13/20      PT LONG TERM GOAL #2   Title Patient to increase gait  velocity to 0.4 m/s across 75m   Baseline Patient demos gait velocity of 0.26 m/s across 174m  Time 8    Period Weeks    Status New    Target Date 09/13/20      PT LONG TERM GOAL #3   Title patient to demo 1/2 grade improvement in LLE strength throughout    Baseline LLE strength ranges fro 2-3+/5 throughout    Time 8    Period Weeks    Status New    Target Date 09/13/20      PT LONG TERM GOAL #4   Title Assess 5x STS progress    Baseline TBD    Time 8    Period Weeks    Status New    Target Date 09/13/20      PT LONG TERM GOAL #5   Title Assess  FGA/DGI progress    Baseline TBD    Time 8    Period Weeks    Status New    Target Date 09/13/20                     Patient will benefit from skilled therapeutic intervention in order to improve the following deficits and impairments:     Visit Diagnosis: Unsteadiness on feet  Hemiplegia and hemiparesis following cerebral infarction affecting right [facility]    Problem List Patient Active Problem List   Diagnosis Date Noted   Internal carotid artery stenosis, bilateral 06/21/2020   Slow transit constipation    Spastic hemiplegia affecting nondominant side (HCC)    Stage 3b chronic kidney disease ([facility]   Essential hypertension    Controlled type 2 diabetes mellitus with hyperglycemia, without long-term current use of insulin ([place]   Protein-calorie malnutrition, severe 04/25/2020   Right middle cerebral artery stroke ([facility]03/21/2022   Acute right MCA stroke ([facility]03/18/2022    JeLanice ShirtsT 07/12/2020, 4:52 PM  [place]u[place]orn[place]hone: [phone] Fax:  [phone]: JoMuse BrincefieldRN: [id number] of Birth: 7/Mar 08, 1960

## 2020-07-13 ENCOUNTER — Other Ambulatory Visit (HOSPITAL_COMMUNITY): Payer: Self-pay

## 2020-07-14 NOTE — Addendum Note (Signed)
Addended by: Lanice Shirts on: 07/14/2020 03:44 PM   Modules accepted: Orders

## 2020-07-16 ENCOUNTER — Other Ambulatory Visit (HOSPITAL_COMMUNITY): Payer: Self-pay

## 2020-07-16 ENCOUNTER — Other Ambulatory Visit: Payer: Self-pay

## 2020-07-16 ENCOUNTER — Telehealth: Payer: Self-pay | Admitting: Registered Nurse

## 2020-07-16 ENCOUNTER — Ambulatory Visit: Payer: Federal, State, Local not specified - PPO

## 2020-07-16 DIAGNOSIS — I63511 Cerebral infarction due to unspecified occlusion or stenosis of right middle cerebral artery: Secondary | ICD-10-CM

## 2020-07-16 DIAGNOSIS — G8110 Spastic hemiplegia affecting unspecified side: Secondary | ICD-10-CM

## 2020-07-16 NOTE — Telephone Encounter (Signed)
OT Referral place today:

## 2020-07-18 ENCOUNTER — Encounter: Payer: Self-pay | Admitting: *Deleted

## 2020-07-18 ENCOUNTER — Other Ambulatory Visit: Payer: Self-pay

## 2020-07-18 ENCOUNTER — Ambulatory Visit
Admission: RE | Admit: 2020-07-18 | Discharge: 2020-07-18 | Disposition: A | Discharge status: Home / Self Care | Payer: Federal, State, Local not specified - PPO | Source: Ambulatory Visit | Attending: Student | Admitting: Student

## 2020-07-18 DIAGNOSIS — I6523 Occlusion and stenosis of bilateral carotid arteries: Secondary | ICD-10-CM

## 2020-07-18 HISTORY — PX: IR RADIOLOGIST EVAL & MGMT: IMG5224

## 2020-07-18 NOTE — Progress Notes (Signed)
Chief Complaint: Carotid artery disease  Referring Physician(s): Louk,Alexandra M  History of Present Illness: Bobby Branch is a 60 y.o. male presenting today as scheduled follow up to Bobby Branch, SP right carotid artery stent 06/21/2020 for symptomatic high grade stenosis.    Bobby Branch joins [place] today in the clinic by himself.    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  The ICA was not stented at the time of ELVO treatment secondary to intra-cerebral hemorrhage and inability to treat with anti-platelets.    He underwent interval stenting of high grade symptomatic lesion 06/21/20. He was discharged home the next day with no problems.    Interval:  He returns today telling me that he has had no neuro changes in the interval. He denies any new neurologic symptoms.  He continues to have a very weak left upper extremity, with ongoing pain at the left shoulder and left hand.   He does report new left hand swelling starting last week.  He says this started when he put a brace on the arm while watching a basketball game.  It has improved but persists.   He reports that he has upcoming appointment with Dr. Letta Pate and with Neurology team.  He still does not have a PCP, though we encouraged this when he was discharged home.    He also reports that he continues to smoke, less than a pack per day.  He tells me he quit marijuana.    He continues aspirin and plavix.     Past Medical History:  Diagnosis Date   Anxiety    Depression    Diabetes mellitus without complication (HCC)    type 2   Dyspnea    inhaler   GERD (gastroesophageal reflux disease)    HLD (hyperlipidemia)    Hypertension    Stroke ([place]) 09/2019    Past Surgical History:  Procedure Laterality Date   EYE SURGERY Left    IR ANGIO EXTRACRAN SEL COM CAROTID INNOMINATE UNI L MOD SED  04/20/2020   IR ANGIO INTRA  EXTRACRAN SEL COM CAROTID INNOMINATE UNI L MOD SED  06/21/2020   IR ANGIO VERTEBRAL SEL SUBCLAVIAN INNOMINATE UNI R MOD SED  06/21/2020   IR CT HEAD LTD  04/20/2020   IR INTRAVSC STENT CERV CAROTID W/EMB-PROT MOD SED INCL ANGIO  06/21/2020   IR PERCUTANEOUS ART THROMBECTOMY/INFUSION INTRACRANIAL INC DIAG ANGIO  04/20/2020   IR RADIOLOGIST EVAL & MGMT  06/05/2020   IR US GUIDE VASC ACCESS RIGHT  04/20/2020   IR US GUIDE VASC ACCESS RIGHT  06/21/2020   RADIOLOGY WITH ANESTHESIA N/A 04/20/2020   Procedure: IR WITH ANESTHESIA;  Surgeon: Luanne Bras, MD;  Location: [place];  Service: Radiology;  Laterality: N/A;   RADIOLOGY WITH ANESTHESIA N/A 06/21/2020   Procedure: RADIOLOGY WITH ANESTHESIA  STENTING;  Surgeon: Corrie Mckusick, DO;  Location: [facility];  Service: Anesthesiology;  Laterality: N/A;    Allergies: Patient has no known allergies.  Medications: Prior to Admission medications   Medication Sig Start Date End Date Taking? Authorizing Provider  acetaminophen (TYLENOL) 325 MG tablet Take 2 tablets (650 mg total) by mouth every 4 (four) hours as needed for mild pain (or temp > 37.5 C (99.5 F)). 05/21/20   Angiulli, Lavon Paganini, PA-C  albuterol (VENTOLIN HFA) 108 (90 Base) MCG/ACT inhaler Inhale 1 puff into the lungs every 6 (six) hours as  needed for wheezing or shortness of breath. 07/04/20   Argentina Donovan, PA-C  amLODipine (NORVASC) 10 MG tablet Take 1 tablet (10 mg total) by mouth daily. 07/04/20   Argentina Donovan, PA-C  aspirin 81 MG EC tablet Take 1 tablet (81 mg total) by mouth daily. 06/22/20   Louk, Bea Graff, PA-C  atorvastatin (LIPITOR) 40 MG tablet Take 1 tablet (40 mg total) by mouth daily. 07/04/20   Argentina Donovan, PA-C  clopidogrel (PLAVIX) 75 MG tablet Take 1 tablet (75 mg total) by mouth daily. 07/04/20   Argentina Donovan, PA-C  diclofenac Sodium (VOLTAREN) 1 % GEL Apply 2 g topically 4 (four) times daily. 05/21/20   Angiulli, Lavon Paganini, PA-C  docusate sodium (COLACE) 100 MG capsule  Take 1 capsule (100 mg total) by mouth 2 (two) times daily. Patient not taking: Reported on 07/03/2020 05/21/20   Angiulli, Lavon Paganini, PA-C  escitalopram (LEXAPRO) 10 MG tablet Take 1 tablet (10 mg total) by mouth daily. 07/04/20   Argentina Donovan, PA-C  famotidine (PEPCID) 20 MG tablet Take 1 tablet (20 mg total) by mouth daily. 07/04/20   Argentina Donovan, PA-C  glimepiride (AMARYL) 2 MG tablet Take 1.5 tablets (3 mg total) by mouth daily with breakfast. 07/04/20   Argentina Donovan, PA-C  hydrALAZINE (APRESOLINE) 50 MG tablet Take 1 tablet (50 mg total) by mouth 3 (three) times daily. 07/04/20   Argentina Donovan, PA-C  lidocaine (LIDODERM) 5 % Place 1 patch onto the skin daily. Remove & Discard patch within 12 hours or as directed by MD Patient not taking: Reported on 07/03/2020 05/21/20   Angiulli, Lavon Paganini, PA-C  Multiple Vitamin (MULTIVITAMIN WITH MINERALS) TABS tablet Take 1 tablet by mouth daily. Patient not taking: No sig reported 05/22/20   Angiulli, Lavon Paganini, PA-C  nicotine (NICODERM CQ - DOSED IN MG/24 HR) 7 mg/24hr patch Place 1 patch (7 mg total) onto the skin daily. Patient not taking: No sig reported 05/22/20   Angiulli, Lavon Paganini, PA-C  polyethylene glycol (MIRALAX / GLYCOLAX) 17 g packet Take 17 g by mouth daily. Patient not taking: Reported on 07/03/2020 05/22/20   Angiulli, Lavon Paganini, PA-C  tiZANidine (ZANAFLEX) 2 MG tablet Take 1 tablet (2 mg total) by mouth 3 (three) times daily. 06/22/20   Kirsteins, Luanna Salk, MD     No family history on file.  Social History   Socioeconomic History   Marital status: Single    Spouse name: Not on file   Number of children: Not on file   Years of education: Not on file   Highest education level: Not on file  Occupational History   Not on file  Tobacco Use   Smoking status: Every Day    Pack years: 0.00    Types: Cigarettes   Smokeless tobacco: Never   Tobacco comments:    patient trying to quit  Vaping Use   Vaping Use: Never used   Substance and Sexual Activity   Alcohol use: Yes    Comment: beer   Drug use: Yes    Types: Cocaine, Marijuana    Comment: Last use in 04/2020   Sexual activity: Yes    Partners: Female  Other Topics Concern   Not on file  Social History Narrative   Not on file   Social Determinants of Health   Financial Resource Strain: Not on file  Food Insecurity: Not on file  Transportation Needs: Not on file  Physical Activity:  Not on file  Stress: Not on file  Social Connections: Not on file      Review of Systems: A 12 point ROS discussed and pertinent positives are indicated in the HPI above.  All other systems are negative.  Review of Systems  Vital Signs: There were no vitals taken for this visit.  Physical Exam General: 60 yo male appearing older than stated age.  Well-developed, well-nourished.  No distress. HEENT: Atraumatic, normocephalic.  Conjugate gaze, extra-ocular motor intact. No scleral icterus or scleral injection. Poor dentition. Oral mucosa moist, pink.  Neck: Symmetric with no goiter enlargement. No bruit on the right.  Soft bruit on left.  Chest/Lungs:  Symmetric chest with inspiration/expiration.  No labored breathing.  Clear to auscultation with no wheezes, rhonchi, or rales.  Heart:  RRR, with no third heart sounds appreciated. No JVD appreciated.  Abdomen:  Soft, NT/ND, with + bowel sounds.   Genito-urinary: Deferred Neurologic: Alert & Oriented to person, place, and time.   Normal affect and insight.   RUE full 5/5 strength. LUE has 3/5 shoulder shrug, with continued weakness of the left shoulder girdle, elbow, wrist, and intrinsics.  RUE full 5/5 strength LUE 4/5 strength  Extremities: LUE with swelling of the distal forearm and the hand Pulse Exam:  palpable bilateral AT & PT. Well healed at the right CFA access site with pulse maintained   Imaging: IR INTRAVSC STENT CERV CAROTID W/EMB-PROT MOD SED  Result Date: 06/21/2020 INDICATION: 60 year old  male with high-grade symptomatic stenosis of the right common carotid artery presents for treatment EXAM: ULTRASOUND-GUIDED RIGHT COMMON FEMORAL ARTERY ACCESS CERVICAL AND CEREBRAL ANGIOGRAM RIGHT CERVICAL INTERNAL CAROTID ARTERY STENTING WITH EMBOLIC PROTECTION DEPLOYMENT OF CLOSURE DEVICE MEDICATIONS: 3000 units heparin.  2 g Ancef ANESTHESIA/SEDATION: The anesthesia team was present to provide general endotracheal tube anesthesia and for patient monitoring during the procedure. Intubation was performed in room 2. interventional neuro radiology nursing staff was also present. FLUOROSCOPY TIME:  Fluoroscopy Time: 12 minutes 54 seconds (590 mGy). COMPLICATIONS: None TECHNIQUE: Informed written consent was obtained from the patient after a thorough discussion of the procedural risks, benefits and alternatives. Specific risks discussed include: Bleeding, infection, contrast reaction, kidney injury/failure, need for further procedure/surgery, arterial injury or dissection, embolization to new territory, intracranial hemorrhage (10-15% risk), neurologic deterioration, cardiopulmonary collapse, death. All questions were addressed. Maximal Sterile Barrier Technique was utilized including during the procedure including caps, mask, sterile gowns, sterile gloves, sterile drape, hand hygiene and skin antiseptic. A timeout was performed prior to the initiation of the procedure. The anesthesia team was present to provide general endotracheal tube anesthesia and for patient monitoring during the procedure. Interventional neuro radiology nursing staff was also present. PROCEDURE: After the anesthesia team initiated general anesthesia, a time-out was performed. The patient is prepped and draped in the usual sterile fashion, room 2 in the neuro IR suite. Ultrasound survey of the right inguinal region was performed with images stored and sent to PACs. 11 blade scalpel was used to make a small incision. Blunt dissection was performed  with ultrasound guidance. A micropuncture needle was used access the right common femoral artery under ultrasound. With excellent arterial blood flow returned, an .018 micro wire was passed through the needle, observed to enter the abdominal aorta under fluoroscopy. The needle was removed, and a micropuncture sheath was placed over the wire. The inner dilator and wire were removed, and an 035 Bentson wire was advanced under fluoroscopy into the abdominal aorta. The sheath was removed  and a standard 5 [place] vascular sheath was placed. The dilator was removed and the sheath was flushed. A 41F JB-1 diagnostic catheter was advanced over the wire to the proximal descending thoracic aorta. Wire was then removed. Double flush of the catheter was performed. We then proceeded with cerebral angiogram. Images were reviewed. We then proceeded with our treatment plan. 3000 units of IV heparin was administered. The JB 1 catheter was used to select the innominate artery, directed then into the right common carotid artery. With an adequate catheter position in the common carotid artery, roadmap angiogram was performed. The catheter was then navigated with the assistance of a Glidewire into the external carotid artery branches. Wire was removed and a rosen wire was placed. Catheter was removed on the Rosen wire. The 5 French sheath was then removed and exchanged for 8 [place] Pinnacle sheath. Sheath was flushed and attached to pressurized and heparinized saline bag for constant forward flow. Then an 8 [place], 85 cm Emboguard balloon tip catheter was prepared on the back table with inflation of the balloon with 50/50 concentration of dilute contrast. The balloon catheter was then advanced over the wire, positioned into the distal right common carotid artery. Copious back flush was performed and the balloon catheter was attached to heparinized and pressurized saline bag for forward flow. Angiogram was then performed. After review of  the images and measurements, we elected to proceed with distal embolic protection using Nav 6 device, 4.65m size. We selected an 8-6 x 40 mm Xact stent, with a plan for pre-dilation 4 mm x 30 mm balloon angioplasty with a Viatrac balloon. These devices were prepped on the back table, including the retrieval device of the Nav 6. Confirmation angiogram was performed for road map guide. The bare wire was advanced through the stenotic region of the ICA, into a distal position within the cervical ICA. The Nav-6 device was then advanced into a safe location above the carotid lesion with deployment of the Nav 6. Timer was started, and a gentle infusion of contrast confirmed forward flow. The deployment device was removed on the bare wire and was disposed. 4 mm balloon angioplasty was then performed. Nine atmospheres was observed. The patient remained hemodynamically stable throughout this maneuver. Balloon was removed and disposed. The 8-6 x 40 mm Xact stent was then deployed at the lesion with the deployment device removed from the bare wire and disposed. Angiogram was performed. The retrieval device was then passed over the bare wire and the Nav 6 embolic protection device was retrieved. The entire unit was removed from the balloon guide catheter under fluoroscopic observation as this traversed the new stent. We stopped the clock, 8 minutes and 7 seconds of embolic protection open time. Angiogram was performed. We then observed 5 minutes interval and a final angiogram was performed. Slight spasm was present at the margin of the stent, with expected resolution and no evidence of high-grade stenosis secondary to the spasm. The 55 centimeter 8 French sheath was exchanged for a 7 [place]ath which was ultimately removed with deployment of 7 [place]ELT device. Patient tolerated the procedure well and remained hemodynamically stable throughout. No complications were encountered and no significant blood loss encountered.  FINDINGS: Initial Findings: Right common carotid artery:  Normal course caliber and contour. Right external carotid artery: Patent with antegrade flow. Right internal carotid artery: Recurrent irregular plaque at the right carotid bifurcation, with eccentric plaque narrowing the lumen 84% stenosis by NASCET criteria. Fairly straight segment beyond  with flow into the cerebral vasculature. Completion Findings: Right MCA: After balloon angioplasty and stenting of the cervical ICA, there is 20% stenosis. Mild spasm just beyond the stent margin, non flow limiting. Initial Findings: Left common carotid artery:  Normal course caliber and contour. Left external carotid artery: Patent with antegrade flow. Left internal carotid artery: Irregular plaque at the proximal right cervical ICA on the left, estimated length 26 mm at the level of C2-C3. There is irregular surface/lumen of the plaque just beyond the bifurcation. Estimated degree of narrowing 71%. Fairly straight course after the plaque proximally. Left-sided cerebral vasculature was not evaluated given the patient's baseline renal function. Right vertebral artery: Patency of the right vertebral artery with regular course caliber and contour. No high-grade stenosis identified. Basilar artery patent. IMPRESSION: Status post ultrasound guided access right common femoral artery for cervical/cerebral angiogram and right cervical ICA stenting for high-grade symptomatic stenosis. CELT deployed for hemostasis. Signed, Dulcy Fanny. Dellia Nims, RPVI [facility] Radiology Electronically Signed   By: Corrie Mckusick D.O.   On: 06/21/2020 12:34   IR US Guide Vasc Access Right  Result Date: 06/21/2020 INDICATION: 60 year old male with high-grade symptomatic stenosis of the right common carotid artery presents for treatment EXAM: ULTRASOUND-GUIDED RIGHT COMMON FEMORAL ARTERY ACCESS CERVICAL AND CEREBRAL ANGIOGRAM RIGHT CERVICAL INTERNAL  CAROTID ARTERY STENTING WITH EMBOLIC PROTECTION DEPLOYMENT OF CLOSURE DEVICE MEDICATIONS: 3000 units heparin.  2 g Ancef ANESTHESIA/SEDATION: The anesthesia team was present to provide general endotracheal tube anesthesia and for patient monitoring during the procedure. Intubation was performed in room 2. interventional neuro radiology nursing staff was also present. FLUOROSCOPY TIME:  Fluoroscopy Time: 12 minutes 54 seconds (590 mGy). COMPLICATIONS: None TECHNIQUE: Informed written consent was obtained from the patient after a thorough discussion of the procedural risks, benefits and alternatives. Specific risks discussed include: Bleeding, infection, contrast reaction, kidney injury/failure, need for further procedure/surgery, arterial injury or dissection, embolization to new territory, intracranial hemorrhage (10-15% risk), neurologic deterioration, cardiopulmonary collapse, death. All questions were addressed. Maximal Sterile Barrier Technique was utilized including during the procedure including caps, mask, sterile gowns, sterile gloves, sterile drape, hand hygiene and skin antiseptic. A timeout was performed prior to the initiation of the procedure. The anesthesia team was present to provide general endotracheal tube anesthesia and for patient monitoring during the procedure. Interventional neuro radiology nursing staff was also present. PROCEDURE: After the anesthesia team initiated general anesthesia, a time-out was performed. The patient is prepped and draped in the usual sterile fashion, room 2 in the neuro IR suite. Ultrasound survey of the right inguinal region was performed with images stored and sent to PACs. 11 blade scalpel was used to make a small incision. Blunt dissection was performed with ultrasound guidance. A micropuncture needle was used access the right common femoral artery under ultrasound. With excellent arterial blood flow returned, an .018 micro wire was passed through the needle,  observed to enter the abdominal aorta under fluoroscopy. The needle was removed, and a micropuncture sheath was placed over the wire. The inner dilator and wire were removed, and an 035 Bentson wire was advanced under fluoroscopy into the abdominal aorta. The sheath was removed and a standard 5 [place] vascular sheath was placed. The dilator was removed and the sheath was flushed. A 24F JB-1 diagnostic catheter was advanced over the wire to the proximal descending thoracic aorta. Wire was then removed. Double flush of the catheter was performed. We then proceeded with cerebral angiogram. Images were  reviewed. We then proceeded with our treatment plan. 3000 units of IV heparin was administered. The JB 1 catheter was used to select the innominate artery, directed then into the right common carotid artery. With an adequate catheter position in the common carotid artery, roadmap angiogram was performed. The catheter was then navigated with the assistance of a Glidewire into the external carotid artery branches. Wire was removed and a rosen wire was placed. Catheter was removed on the Rosen wire. The 5 French sheath was then removed and exchanged for 8 [place] Pinnacle sheath. Sheath was flushed and attached to pressurized and heparinized saline bag for constant forward flow. Then an 8 [place], 85 cm Emboguard balloon tip catheter was prepared on the back table with inflation of the balloon with 50/50 concentration of dilute contrast. The balloon catheter was then advanced over the wire, positioned into the distal right common carotid artery. Copious back flush was performed and the balloon catheter was attached to heparinized and pressurized saline bag for forward flow. Angiogram was then performed. After review of the images and measurements, we elected to proceed with distal embolic protection using Nav 6 device, 4.32m size. We selected an 8-6 x 40 mm Xact stent, with a plan for pre-dilation 4 mm x 30 mm balloon  angioplasty with a Viatrac balloon. These devices were prepped on the back table, including the retrieval device of the Nav 6. Confirmation angiogram was performed for road map guide. The bare wire was advanced through the stenotic region of the ICA, into a distal position within the cervical ICA. The Nav-6 device was then advanced into a safe location above the carotid lesion with deployment of the Nav 6. Timer was started, and a gentle infusion of contrast confirmed forward flow. The deployment device was removed on the bare wire and was disposed. 4 mm balloon angioplasty was then performed. Nine atmospheres was observed. The patient remained hemodynamically stable throughout this maneuver. Balloon was removed and disposed. The 8-6 x 40 mm Xact stent was then deployed at the lesion with the deployment device removed from the bare wire and disposed. Angiogram was performed. The retrieval device was then passed over the bare wire and the Nav 6 embolic protection device was retrieved. The entire unit was removed from the balloon guide catheter under fluoroscopic observation as this traversed the new stent. We stopped the clock, 8 minutes and 7 seconds of embolic protection open time. Angiogram was performed. We then observed 5 minutes interval and a final angiogram was performed. Slight spasm was present at the margin of the stent, with expected resolution and no evidence of high-grade stenosis secondary to the spasm. The 55 centimeter 8 French sheath was exchanged for a 7 [place]heath which was ultimately removed with deployment of 7 [place]ELT device. Patient tolerated the procedure well and remained hemodynamically stable throughout. No complications were encountered and no significant blood loss encountered. FINDINGS: Initial Findings: Right common carotid artery:  Normal course caliber and contour. Right external carotid artery: Patent with antegrade flow. Right internal carotid artery: Recurrent irregular  plaque at the right carotid bifurcation, with eccentric plaque narrowing the lumen 84% stenosis by NASCET criteria. Fairly straight segment beyond with flow into the cerebral vasculature. Completion Findings: Right MCA: After balloon angioplasty and stenting of the cervical ICA, there is 20% stenosis. Mild spasm just beyond the stent margin, non flow limiting. Initial Findings: Left common carotid artery:  Normal course caliber and contour. Left external carotid artery: Patent with antegrade flow.  Left internal carotid artery: Irregular plaque at the proximal right cervical ICA on the left, estimated length 26 mm at the level of C2-C3. There is irregular surface/lumen of the plaque just beyond the bifurcation. Estimated degree of narrowing 71%. Fairly straight course after the plaque proximally. Left-sided cerebral vasculature was not evaluated given the patient's baseline renal function. Right vertebral artery: Patency of the right vertebral artery with regular course caliber and contour. No high-grade stenosis identified. Basilar artery patent. IMPRESSION: Status post ultrasound guided access right common femoral artery for cervical/cerebral angiogram and right cervical ICA stenting for high-grade symptomatic stenosis. CELT deployed for hemostasis. Signed, Dulcy Fanny. Dellia Nims, RPVI [facility] Radiology Electronically Signed   By: Corrie Mckusick D.O.   On: 06/21/2020 12:34   IR ANGIO INTRA EXTRACRAN SEL COM CAROTID INNOMINATE UNI L MOD SED  Result Date: 06/21/2020 INDICATION: 60 year old male with high-grade symptomatic stenosis of the right common carotid artery presents for treatment EXAM: ULTRASOUND-GUIDED RIGHT COMMON FEMORAL ARTERY ACCESS CERVICAL AND CEREBRAL ANGIOGRAM RIGHT CERVICAL INTERNAL CAROTID ARTERY STENTING WITH EMBOLIC PROTECTION DEPLOYMENT OF CLOSURE DEVICE MEDICATIONS: 3000 units heparin.  2 g Ancef ANESTHESIA/SEDATION: The anesthesia team was  present to provide general endotracheal tube anesthesia and for patient monitoring during the procedure. Intubation was performed in room 2. interventional neuro radiology nursing staff was also present. FLUOROSCOPY TIME:  Fluoroscopy Time: 12 minutes 54 seconds (590 mGy). COMPLICATIONS: None TECHNIQUE: Informed written consent was obtained from the patient after a thorough discussion of the procedural risks, benefits and alternatives. Specific risks discussed include: Bleeding, infection, contrast reaction, kidney injury/failure, need for further procedure/surgery, arterial injury or dissection, embolization to new territory, intracranial hemorrhage (10-15% risk), neurologic deterioration, cardiopulmonary collapse, death. All questions were addressed. Maximal Sterile Barrier Technique was utilized including during the procedure including caps, mask, sterile gowns, sterile gloves, sterile drape, hand hygiene and skin antiseptic. A timeout was performed prior to the initiation of the procedure. The anesthesia team was present to provide general endotracheal tube anesthesia and for patient monitoring during the procedure. Interventional neuro radiology nursing staff was also present. PROCEDURE: After the anesthesia team initiated general anesthesia, a time-out was performed. The patient is prepped and draped in the usual sterile fashion, room 2 in the neuro IR suite. Ultrasound survey of the right inguinal region was performed with images stored and sent to PACs. 11 blade scalpel was used to make a small incision. Blunt dissection was performed with ultrasound guidance. A micropuncture needle was used access the right common femoral artery under ultrasound. With excellent arterial blood flow returned, an .018 micro wire was passed through the needle, observed to enter the abdominal aorta under fluoroscopy. The needle was removed, and a micropuncture sheath was placed over the wire. The inner dilator and wire were  removed, and an 035 Bentson wire was advanced under fluoroscopy into the abdominal aorta. The sheath was removed and a standard 5 [place] vascular sheath was placed. The dilator was removed and the sheath was flushed. A 75F JB-1 diagnostic catheter was advanced over the wire to the proximal descending thoracic aorta. Wire was then removed. Double flush of the catheter was performed. We then proceeded with cerebral angiogram. Images were reviewed. We then proceeded with our treatment plan. 3000 units of IV heparin was administered. The JB 1 catheter was used to select the innominate artery, directed then into the right common carotid artery. With an adequate catheter position in the common carotid artery, roadmap angiogram was  performed. The catheter was then navigated with the assistance of a Glidewire into the external carotid artery branches. Wire was removed and a rosen wire was placed. Catheter was removed on the Rosen wire. The 5 French sheath was then removed and exchanged for 8 [place] Pinnacle sheath. Sheath was flushed and attached to pressurized and heparinized saline bag for constant forward flow. Then an 8 [place], 85 cm Emboguard balloon tip catheter was prepared on the back table with inflation of the balloon with 50/50 concentration of dilute contrast. The balloon catheter was then advanced over the wire, positioned into the distal right common carotid artery. Copious back flush was performed and the balloon catheter was attached to heparinized and pressurized saline bag for forward flow. Angiogram was then performed. After review of the images and measurements, we elected to proceed with distal embolic protection using Nav 6 device, 4.90m size. We selected an 8-6 x 40 mm Xact stent, with a plan for pre-dilation 4 mm x 30 mm balloon angioplasty with a Viatrac balloon. These devices were prepped on the back table, including the retrieval device of the Nav 6. Confirmation angiogram was performed for road  map guide. The bare wire was advanced through the stenotic region of the ICA, into a distal position within the cervical ICA. The Nav-6 device was then advanced into a safe location above the carotid lesion with deployment of the Nav 6. Timer was started, and a gentle infusion of contrast confirmed forward flow. The deployment device was removed on the bare wire and was disposed. 4 mm balloon angioplasty was then performed. Nine atmospheres was observed. The patient remained hemodynamically stable throughout this maneuver. Balloon was removed and disposed. The 8-6 x 40 mm Xact stent was then deployed at the lesion with the deployment device removed from the bare wire and disposed. Angiogram was performed. The retrieval device was then passed over the bare wire and the Nav 6 embolic protection device was retrieved. The entire unit was removed from the balloon guide catheter under fluoroscopic observation as this traversed the new stent. We stopped the clock, 8 minutes and 7 seconds of embolic protection open time. Angiogram was performed. We then observed 5 minutes interval and a final angiogram was performed. Slight spasm was present at the margin of the stent, with expected resolution and no evidence of high-grade stenosis secondary to the spasm. The 55 centimeter 8 French sheath was exchanged for a 7 [place]ath which was ultimately removed with deployment of 7 [place] device. Patient tolerated the procedure well and remained hemodynamically stable throughout. No complications were encountered and no significant blood loss encountered. FINDINGS: Initial Findings: Right common carotid artery:  Normal course caliber and contour. Right external carotid artery: Patent with antegrade flow. Right internal carotid artery: Recurrent irregular plaque at the right carotid bifurcation, with eccentric plaque narrowing the lumen 84% stenosis by NASCET criteria. Fairly straight segment beyond with flow into the cerebral  vasculature. Completion Findings: Right MCA: After balloon angioplasty and stenting of the cervical ICA, there is 20% stenosis. Mild spasm just beyond the stent margin, non flow limiting. Initial Findings: Left common carotid artery:  Normal course caliber and contour. Left external carotid artery: Patent with antegrade flow. Left internal carotid artery: Irregular plaque at the proximal right cervical ICA on the left, estimated length 26 mm at the level of C2-C3. There is irregular surface/lumen of the plaque just beyond the bifurcation. Estimated degree of narrowing 71%. Fairly straight course after the plaque proximally.  Left-sided cerebral vasculature was not evaluated given the patient's baseline renal function. Right vertebral artery: Patency of the right vertebral artery with regular course caliber and contour. No high-grade stenosis identified. Basilar artery patent. IMPRESSION: Status post ultrasound guided access right common femoral artery for cervical/cerebral angiogram and right cervical ICA stenting for high-grade symptomatic stenosis. CELT deployed for hemostasis. Signed, Dulcy Fanny. Dellia Nims, RPVI [facility] Radiology Electronically Signed   By: Corrie Mckusick D.O.   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  2 g Ancef ANESTHESIA/SEDATION: The anesthesia team was present to provide general endotracheal tube anesthesia and for patient monitoring during the procedure. Intubation was performed in room 2. interventional neuro radiology nursing  staff was also present. FLUOROSCOPY TIME:  Fluoroscopy Time: 12 minutes 54 seconds (590 mGy). COMPLICATIONS: None TECHNIQUE: Informed written consent was obtained from the patient after a thorough discussion of the procedural risks, benefits and alternatives. Specific risks discussed include: Bleeding, infection, contrast reaction, kidney injury/failure, need for further procedure/surgery, arterial injury or dissection, embolization to new territory, intracranial hemorrhage (10-15% risk), neurologic deterioration, cardiopulmonary collapse, death. All questions were addressed. Maximal Sterile Barrier Technique was utilized including during the procedure including caps, mask, sterile gowns, sterile gloves, sterile drape, hand hygiene and skin antiseptic. A timeout was performed prior to the initiation of the procedure. The anesthesia team was present to provide general endotracheal tube anesthesia and for patient monitoring during the procedure. Interventional neuro radiology nursing staff was also present. PROCEDURE: After the anesthesia team initiated general anesthesia, a time-out was performed. The patient is prepped and draped in the usual sterile fashion, room 2 in the neuro IR suite. Ultrasound survey of the right inguinal region was performed with images stored and sent to PACs. 11 blade scalpel was used to make a small incision. Blunt dissection was performed with ultrasound guidance. A micropuncture needle was used access the right common femoral artery under ultrasound. With excellent arterial blood flow returned, an .018 micro wire was passed through the needle, observed to enter the abdominal aorta under fluoroscopy. The needle was removed, and a micropuncture sheath was placed over the wire. The inner dilator and wire were removed, and an 035 Bentson wire was advanced under fluoroscopy into the abdominal aorta. The sheath was removed and a standard 5 [place] vascular sheath was placed. The dilator was  removed and the sheath was flushed. A 46F JB-1 diagnostic catheter was advanced over the wire to the proximal descending thoracic aorta. Wire was then removed. Double flush of the catheter was performed. We then proceeded with cerebral angiogram. Images were reviewed. We then proceeded with our treatment plan. 3000 units of IV heparin was administered. The JB 1 catheter was used to select the innominate artery, directed then into the right common carotid artery. With an adequate catheter position in the common carotid artery, roadmap angiogram was performed. The catheter was then navigated with the assistance of a Glidewire into the external carotid artery branches. Wire was removed and a rosen wire was placed. Catheter was removed on the Rosen wire. The 5 French sheath was then removed and exchanged for 8 [place] Pinnacle sheath. Sheath  was flushed and attached to pressurized and heparinized saline bag for constant forward flow. Then an 8 [place], 85 cm Emboguard balloon tip catheter was prepared on the back table with inflation of the balloon with 50/50 concentration of dilute contrast. The balloon catheter was then advanced over the wire, positioned into the distal right common carotid artery. Copious back flush was performed and the balloon catheter was attached to heparinized and pressurized saline bag for forward flow. Angiogram was then performed. After review of the images and measurements, we elected to proceed with distal embolic protection using Nav 6 device, 4.40m size. We selected an 8-6 x 40 mm Xact stent, with a plan for pre-dilation 4 mm x 30 mm balloon angioplasty with a Viatrac balloon. These devices were prepped on the back table, including the retrieval device of the Nav 6. Confirmation angiogram was performed for road map guide. The bare wire was advanced through the stenotic region of the ICA, into a distal position within the cervical ICA. The Nav-6 device was then advanced into a safe location  above the carotid lesion with deployment of the Nav 6. Timer was started, and a gentle infusion of contrast confirmed forward flow. The deployment device was removed on the bare wire and was disposed. 4 mm balloon angioplasty was then performed. Nine atmospheres was observed. The patient remained hemodynamically stable throughout this maneuver. Balloon was removed and disposed. The 8-6 x 40 mm Xact stent was then deployed at the lesion with the deployment device removed from the bare wire and disposed. Angiogram was performed. The retrieval device was then passed over the bare wire and the Nav 6 embolic protection device was retrieved. The entire unit was removed from the balloon guide catheter under fluoroscopic observation as this traversed the new stent. We stopped the clock, 8 minutes and 7 seconds of embolic protection open time. Angiogram was performed. We then observed 5 minutes interval and a final angiogram was performed. Slight spasm was present at the margin of the stent, with expected resolution and no evidence of high-grade stenosis secondary to the spasm. The 55 centimeter 8 French sheath was exchanged for a 7 [place]ath which was ultimately removed with deployment of 7 [place]ELT device. Patient tolerated the procedure well and remained hemodynamically stable throughout. No complications were encountered and no significant blood loss encountered. FINDINGS: Initial Findings: Right common carotid artery:  Normal course caliber and contour. Right external carotid artery: Patent with antegrade flow. Right internal carotid artery: Recurrent irregular plaque at the right carotid bifurcation, with eccentric plaque narrowing the lumen 84% stenosis by NASCET criteria. Fairly straight segment beyond with flow into the cerebral vasculature. Completion Findings: Right MCA: After balloon angioplasty and stenting of the cervical ICA, there is 20% stenosis. Mild spasm just beyond the stent margin, non flow  limiting. Initial Findings: Left common carotid artery:  Normal course caliber and contour. Left external carotid artery: Patent with antegrade flow. Left internal carotid artery: Irregular plaque at the proximal right cervical ICA on the left, estimated length 26 mm at the level of C2-C3. There is irregular surface/lumen of the plaque just beyond the bifurcation. Estimated degree of narrowing 71%. Fairly straight course after the plaque proximally. Left-sided cerebral vasculature was not evaluated given the patient's baseline renal function. Right vertebral artery: Patency of the right vertebral artery with regular course caliber and contour. No high-grade stenosis identified. Basilar artery patent. IMPRESSION: Status post ultrasound guided access right common femoral artery for cervical/cerebral angiogram and right  cervical ICA stenting for high-grade symptomatic stenosis. CELT deployed for hemostasis. Signed, Dulcy Fanny. Dellia Nims, RPVI [facility] Radiology Electronically Signed   By: Corrie Mckusick D.O.   On: 06/21/2020 12:34    Labs:  CBC: Recent Labs    04/23/20 0345 04/24/20 0504 05/07/20 0656 06/21/20 0618  WBC 9.2 7.1 7.9 7.7  HGB 12.4* 12.7* 12.4* 12.6*  HCT 37.0* 37.3* 36.4* 39.1  PLT 311 306 366 321    COAGS: Recent Labs    04/20/20 0011 06/21/20 0618  INR 0.9 0.9  APTT 32  --     BMP: Recent Labs    05/07/20 0656 05/13/20 0854 05/17/20 0512 06/21/20 0618  NA 136 136 135 139  K 3.7 3.8 4.6 4.0  CL 105 104 104 105  CO2 '25 25 24 28  '$ GLUCOSE 125* 227* 153* 75  BUN 19 27* 33* 22*  CALCIUM 8.7* 8.8* 9.4 9.4  CREATININE 1.86* 1.96* 2.13* 2.57*  GFRNONAA 42* 39* 35* 28*    LIVER FUNCTION TESTS: Recent Labs    04/20/20 0011 04/20/20 0500 04/24/20 0504  BILITOT 0.7 0.6 0.5  AST 27 34 26  ALT '18 23 15  '$ ALKPHOS 67 68 60  PROT 5.9* 5.4* 5.4*  ALBUMIN 2.6* 2.5* 2.1*    TUMOR MARKERS: No results for input(s):  AFPTM, CEA, CA199, CHROMGRNA in the last 8760 hours.  Assessment and Plan:  Bobby Gililland is a very pleasant 60 year-old male SP treatment of symptomatic high grade right ICA stenosis, with no peri-procedural problems.  He has recovered from prior treatment, and today we discussed his contralateral disease.   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   The duplex performed in hospital on 3/19 shows left disease of 80-99%.  His follow up angiogram confirms these findings.     I had a lengthy discussion with Bobby Sohal regarding treatment options.  I did emphasize that he likely wound benefit from treatment given his contralateral symptoms and disease pattern, and that a discussion with our surgical colleagues regarding CEA would be of benefit.   I also briefly reviewed AHA Guidelines for maximizing medical treatment including: Lifestyle modification, Treatment of hypertension; High-intensity statin therapy; Anti-platelet therapy; And importantly Tobacco cessation; With regard to smoking cessation, he has gone back and forth, having started smoking again.    After our shared decision making, with consideration of diagnosis of high grade asymptomatic left ICA disease, our plan is to refer to Vascular Surgery for discussion of CEA, with which he agrees.   Plan: -       Continue intensive medical management, as above - We will order a venous duplex left upper extremity now to evaluate his new swelling  -  He has high grade stenosis left carotid artery, asymptomatic.     We will refer him over to Dr. Servando Snare with Vascular Surgery, to discuss CEA.  -       Regarding intensive medical management, I again discussed with him the need for smoking cessation.  Strategies were discussed. - Plan for office visit in 6-8 months   ___________________________________________________________________________________________________________________    Electronically Signed: Corrie Mckusick 07/18/2020, 2:30 PM   I spent a total of    40 Minutes in face to face in clinical consultation, greater than 50% of  which was counseling/coordinating care for treated right ICA stenosis with carotid artery stenting, and discuss his high grade left ICA disease, asymptomatic, possible treatment.

## 2020-07-19 ENCOUNTER — Other Ambulatory Visit: Payer: Self-pay

## 2020-07-19 ENCOUNTER — Encounter: Payer: Self-pay | Admitting: Occupational Therapy

## 2020-07-19 ENCOUNTER — Ambulatory Visit: Payer: Federal, State, Local not specified - PPO | Admitting: Occupational Therapy

## 2020-07-19 ENCOUNTER — Other Ambulatory Visit: Payer: Self-pay | Admitting: Interventional Radiology

## 2020-07-19 ENCOUNTER — Ambulatory Visit: Payer: Federal, State, Local not specified - PPO

## 2020-07-19 ENCOUNTER — Other Ambulatory Visit: Payer: Self-pay | Admitting: Physical Medicine & Rehabilitation

## 2020-07-19 DIAGNOSIS — R41840 Attention and concentration deficit: Secondary | ICD-10-CM

## 2020-07-19 DIAGNOSIS — R2681 Unsteadiness on feet: Secondary | ICD-10-CM

## 2020-07-19 DIAGNOSIS — I69351 Hemiplegia and hemiparesis following cerebral infarction affecting right dominant side: Secondary | ICD-10-CM

## 2020-07-19 DIAGNOSIS — M25512 Pain in left shoulder: Secondary | ICD-10-CM

## 2020-07-19 DIAGNOSIS — I69354 Hemiplegia and hemiparesis following cerebral infarction affecting left non-dominant side: Secondary | ICD-10-CM

## 2020-07-19 DIAGNOSIS — M7989 Other specified soft tissue disorders: Secondary | ICD-10-CM

## 2020-07-19 DIAGNOSIS — M25642 Stiffness of left hand, not elsewhere classified: Secondary | ICD-10-CM

## 2020-07-19 DIAGNOSIS — G8929 Other chronic pain: Secondary | ICD-10-CM

## 2020-07-19 DIAGNOSIS — I63511 Cerebral infarction due to unspecified occlusion or stenosis of right middle cerebral artery: Secondary | ICD-10-CM

## 2020-07-19 DIAGNOSIS — M25612 Stiffness of left shoulder, not elsewhere classified: Secondary | ICD-10-CM

## 2020-07-19 NOTE — Patient Instructions (Signed)
Access Code: [email or web address] URL: [email or web address] Date: 07/19/2020 Prepared by: Sharlynn Oliphant  Exercises Seated Heel Slide - 2 x daily - 7 x weekly - 2 sets - 15 reps Seated March - 2 x daily - 7 x weekly - 2 sets - 15 reps Seated Long Arc Quad - 2 x daily - 7 x weekly - 2 sets - 15 reps

## 2020-07-19 NOTE — Therapy (Signed)
[facility] [address] [place], [place], [address] Phone: [phone]   Fax:  [phone]  Occupational Therapy Evaluation  Patient Details  Name: Bobby Branch MRN: [id number] Date of Birth: 01-Feb-1961 Referring Provider (OT): Danella Sensing   Encounter Date: 07/19/2020   OT End of Session - 07/19/20 1703     Visit Number 1    Number of Visits 13    Authorization Type Medicaid - pending auth    OT Start Time [id number]    OT Stop Time 1700    OT Time Calculation (min) 45 min    Activity Tolerance Patient tolerated treatment well    Behavior During Therapy Impulsive             Past Medical History:  Diagnosis Date   Anxiety    Depression    Diabetes mellitus without complication ([place])    type 2   Dyspnea    inhaler   GERD (gastroesophageal reflux disease)    HLD (hyperlipidemia)    Hypertension    Stroke ([place]) 09/2019    Past Surgical History:  Procedure Laterality Date   EYE SURGERY Left    IR ANGIO EXTRACRAN SEL COM CAROTID INNOMINATE UNI L MOD SED  04/20/2020   IR ANGIO INTRA EXTRACRAN SEL COM CAROTID INNOMINATE UNI L MOD SED  06/21/2020   IR ANGIO VERTEBRAL SEL SUBCLAVIAN INNOMINATE UNI R MOD SED  06/21/2020   IR CT HEAD LTD  04/20/2020   IR INTRAVSC STENT CERV CAROTID W/EMB-PROT MOD SED INCL ANGIO  06/21/2020   IR PERCUTANEOUS ART THROMBECTOMY/INFUSION INTRACRANIAL INC DIAG ANGIO  04/20/2020   IR RADIOLOGIST EVAL & MGMT  06/05/2020   IR RADIOLOGIST EVAL & MGMT  07/18/2020   IR US GUIDE VASC ACCESS RIGHT  04/20/2020   IR US GUIDE VASC ACCESS RIGHT  06/21/2020   RADIOLOGY WITH ANESTHESIA N/A 04/20/2020   Procedure: IR WITH ANESTHESIA;  Surgeon: Luanne Bras, MD;  Location: [facility];  Service: Radiology;  Laterality: N/A;   RADIOLOGY WITH ANESTHESIA N/A 06/21/2020   Procedure: RADIOLOGY WITH ANESTHESIA  STENTING;  Surgeon: Corrie Mckusick, DO;  Location: [facility];  Service: Anesthesiology;  Laterality: N/A;     There were no vitals filed for this visit.   Subjective Assessment - 07/19/20 1623     Subjective  Get this arm working    Currently in Pain? Yes    Pain Score 3     Pain Location Arm    Pain Orientation Left    Pain Descriptors / Indicators Burning    Pain Type Acute pain    Pain Onset 1 to 4 weeks ago    Pain Frequency Intermittent    Aggravating Factors  reflexive movement, movement    Pain Relieving Factors advil liquid gels               [facility] OT Assessment - 07/19/20 0001       Assessment   Medical Diagnosis R MCA stroke    Referring Provider (OT) Danella Sensing    Onset Date/Surgical Date 04/20/20    Hand Dominance Right    Prior Therapy CIR      Precautions   Precautions Fall      Balance Screen   Has the patient fallen in the past 6 months Yes    How many times? 4    Has the patient had a decrease in activity level because of a fear of falling?  Yes  Prior Function   Level of Independence Independent with basic ADLs    Vocation Self employed    Teacher, English as a foreign language in [employer]    Leisure make music      ADL   Eating/Feeding Needs assist with cutting food    Grooming Minimal assistance    Upper Body Bathing Minimal assistance    Lower Body Bathing Modified independent    Upper Body Dressing Minimal assistance    Lower Body Dressing Minimal assistance    Toilet Transfer Modified independent    Toileting - Clothing Manipulation Modified independent    Toileting -  Hygiene Modified Independent    Tub/Shower Transfer Modified independent    ADL comments Uses shower bench, walking with rollator - holding only one handle      Vision - History   Baseline Vision Wears glasses only for reading    Visual History Retinopathy    Additional Comments retinopathy, worsening with stroke.  Visual acuity devolving      Vision Assessment   Eye Alignment Within Functional Limits    Ocular Range of Motion Within Functional Limits     Tracking/Visual Pursuits Able to track stimulus in all quads without difficulty    Comment Patient reports declning vision - acuity even worse with recent stroke      Cognition   Overall Cognitive Status Impaired/Different from baseline    Attention Selective    Cognition Comments Patient reports decreased frustration tolerance.  Mildly impulsive      Observation/Other Assessments   Focus on Therapeutic Outcomes (FOTO)  55      Posture/Postural Control   Posture/Postural Control Postural limitations    Postural Limitations Rounded Shoulders;Forward head;Posterior pelvic tilt;Flexed trunk    Posture Comments right bias in standing and sitting      Sensation   Light Touch Impaired by gross assessment    Stereognosis Not tested    Proprioception Impaired by gross assessment      Coordination   Gross Motor Movements are Fluid and Coordinated No    Fine Motor Movements are Fluid and Coordinated No    Box and Blocks Unable      Perception   Perception Impaired      Praxis   Praxis Not tested      Edema   Edema significant swelling dorsum left hand      Tone   Assessment Location Left Upper Extremity      ROM / Strength   AROM / PROM / Strength AROM      AROM   Overall AROM  Deficits    AROM Assessment Site Shoulder    Right/Left Shoulder Left    Left Shoulder Flexion 70 Degrees    Left Shoulder ABduction 60 Degrees      LUE Tone   LUE Tone Moderate;Hypertonic      LUE Tone   Hypertonic Details shoulder IR, elbow flex, wrist flex, finger flex                             OT Education - 07/19/20 1703     Education Details edema management - elevation, massage, range of motion, edema glove    [name]    Methods Explanation;Demonstration;Tactile cues;Verbal cues    Comprehension Verbalized understanding;Returned demonstration;Need further instruction              OT Short Term Goals - 07/19/20 1710  OT SHORT TERM  GOAL #1   Title Patient will complete an HEP designed to improve range of motion and decrease pain in LUE    Baseline No HEP    Time 4    Period Weeks    Status New    Target Date 09/02/20      OT SHORT TERM GOAL #2   Title Patient will demonstrate sufficient shoulder range of motion to apply deodorant and wash under left arm with pain no greater than 1-2/10    Baseline Difficulty washing and applying deodorant pain 6+/10    Time 4    Period Weeks    Status New      OT SHORT TERM GOAL #3   Title Patient will demonstrate 5 degrees of active wrist flex or extension following facilitation    Baseline no volitional wrist movement    Time 4    Period Weeks    Status New      OT SHORT TERM GOAL #4   Title Patient will demonstrate 90% composite wrist flexion/extension passively without pain of more than 1-2/10    Baseline Patient unable to tolerate passive stretch to fingers    Time 4    Period Weeks    Status New      OT SHORT TERM GOAL #5   Title Patient will demonstrate 90 degrees of passive shoulder flexion in preparation for forward reaching    Baseline 70*    Time 4    Period Weeks    Status New               OT Long Term Goals - 07/19/20 1714       OT LONG TERM GOAL #1   Title Patient will complete updated HEP designed to improve functional use of LUE    Baseline No HEP    Time 6    Period Weeks    Status New    Target Date 09/17/20      OT LONG TERM GOAL #2   Title Patient will don/doff left UE positioning devices as warranted    Baseline No current splints or slings    Time 6    Period Weeks    Status New      OT LONG TERM GOAL #3   Title Patient will demonstrate ability to cut food on plate    Baseline Unable to cut up food    Time 6    Period Weeks    Status New      OT LONG TERM GOAL #4   Title Patient will demonstrate ability to peel/chop vegetables as needed for soup    Baseline Unable to cut veggies    Time 6    Period Weeks    Status  New      OT LONG TERM GOAL #5   Title Patient will demonstrate ability to button/zipper clothing as needed    Baseline Opts to wear clothing without fasteners    Time 6    Period Weeks    Status New                   Plan - 07/19/20 1704     Clinical Impression Statement Patient referred to OP OT following R MCA stroke in March and revascularization in May.  Patient with complicated medical history - CKD, DM, malnutrition, HTN, polysubstance abuse, prior stroke 08/21 who presents with spastic left sided hemiplegia, painful shoulder and hand, edema hand, decreased range of  motion throughout LUE, decreased balance, decreased attention/safety awareness, and declining vision - all of which impede independent performance of basic self care skills, and IADL.    OT Occupational Profile and History Detailed Assessment- Review of Records and additional review of physical, cognitive, psychosocial history related to current functional performance    Occupational performance deficits (Please refer to evaluation for details): ADL's;IADL's;Rest and Sleep    Body Structure / Function / Physical Skills ADL;Dexterity;Flexibility;Muscle spasms;ROM;Strength;Vision;Tone;IADL;FMC;Edema;Coordination;Balance;Body mechanics;Decreased knowledge of precautions;Endurance;Improper spinal/pelvic alignment;Pain;Sensation;Decreased knowledge of use of DME;GMC;Mobility;Proprioception    Cognitive Skills Attention;Memory;Perception;Energy/Drive;Problem Solve;Safety Awareness    Rehab Potential Good    Clinical Decision Making Several treatment options, min-mod task modification necessary    Comorbidities Affecting Occupational Performance: May have comorbidities impacting occupational performance    Modification or Assistance to Complete Evaluation  Min-Moderate modification of tasks or assist with assess necessary to complete eval    OT Frequency 2x / week    OT Duration 6 weeks    OT Treatment/Interventions  Self-care/ADL training;Therapeutic exercise;DME and/or AE instruction;Functional Mobility Training;Cognitive remediation/compensation;Balance training;Visual/perceptual remediation/compensation;Splinting;Manual Therapy;Neuromuscular education;Fluidtherapy;Electrical Stimulation;Aquatic Barista;Iontophoresis;Passive range of motion;Therapeutic activities;Patient/family education    Plan Hand splint, needs stretching program for LUE, LUE NMR    Consulted and Agree with Plan of Care Patient             Patient will benefit from skilled therapeutic intervention in order to improve the following deficits and impairments:   Body Structure / Function / Physical Skills: ADL, Dexterity, Flexibility, Muscle spasms, ROM, Strength, Vision, Tone, IADL, FMC, Edema, Coordination, Balance, Body mechanics, Decreased knowledge of precautions, Endurance, Improper spinal/pelvic alignment, Pain, Sensation, Decreased knowledge of use of DME, GMC, Mobility, Proprioception Cognitive Skills: Attention, Memory, Perception, Energy/Drive, Problem Solve, Safety Awareness     Visit Diagnosis: Spastic 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 (HCC)  Chronic left shoulder pain  Stiffness of left shoulder, not elsewhere classified  Stiffness of left hand, not elsewhere classified  Unsteadiness on feet  Attention and concentration deficit    Problem List Patient Active Problem List   Diagnosis Date Noted   Internal carotid artery stenosis, bilateral 06/21/2020   Slow transit constipation    Spastic hemiplegia affecting nondominant side (HCC)    Stage 3b chronic kidney disease ([facility])    Essential hypertension    Controlled type 2 diabetes mellitus with hyperglycemia, without long-term current use of insulin ([facility] [place])    Protein-calorie malnutrition, severe 04/25/2020   Right middle cerebral artery stroke ([facility] [facility]) 04/23/2020   Acute right MCA stroke ([place])  04/20/2020    Mariah Milling 07/19/2020, 5:21 PM  [facility] [address] [place], [place], [address] Phone: [phone]   Fax:  [phone]  Name: Bobby Branch MRN: [id number] Date of Birth: 1960/03/27

## 2020-07-19 NOTE — Therapy (Signed)
[facility] [address] [place], [place], [address] Phone: [phone]   Fax:  [phone]  Physical Therapy Treatment  Bobby Branch Details  Name: Bobby Branch MRN: [id number] Date of Birth: 1960-11-27 Referring Provider (PT): Danella Sensing NP   Encounter Date: 07/19/2020   PT End of Session - 07/19/20 1542     Visit Number 2    Number of Visits 17    Date for PT Re-Evaluation 09/13/20    Authorization Type MCD pending    Progress Note Due on Visit 10    PT Start Time 1445    PT Stop Time 1530    PT Time Calculation (min) 45 min    Equipment Utilized During Treatment Gait belt    Activity Tolerance Bobby Branch tolerated treatment well    Behavior During Therapy WFL for tasks assessed/performed             Past Medical History:  Diagnosis Date   Anxiety    Depression    Diabetes mellitus without complication ([facility])    type 2   Dyspnea    inhaler   GERD (gastroesophageal reflux disease)    HLD (hyperlipidemia)    Hypertension    Stroke ([facility]) 09/2019    Past Surgical History:  Procedure Laterality Date   EYE SURGERY Left    IR ANGIO EXTRACRAN SEL COM CAROTID INNOMINATE UNI L MOD SED  04/20/2020   IR ANGIO INTRA EXTRACRAN SEL COM CAROTID INNOMINATE UNI L MOD SED  06/21/2020   IR ANGIO VERTEBRAL SEL SUBCLAVIAN INNOMINATE UNI R MOD SED  06/21/2020   IR CT HEAD LTD  04/20/2020   IR INTRAVSC STENT CERV CAROTID W/EMB-PROT MOD SED INCL ANGIO  06/21/2020   IR PERCUTANEOUS ART THROMBECTOMY/INFUSION INTRACRANIAL INC DIAG ANGIO  04/20/2020   IR RADIOLOGIST EVAL & MGMT  06/05/2020   IR RADIOLOGIST EVAL & MGMT  07/18/2020   IR US GUIDE VASC ACCESS RIGHT  04/20/2020   IR US GUIDE VASC ACCESS RIGHT  06/21/2020   RADIOLOGY WITH ANESTHESIA N/A 04/20/2020   Procedure: IR WITH ANESTHESIA;  Surgeon: Luanne Bras, MD;  Location: [facility];  Service: Radiology;  Laterality: N/A;   RADIOLOGY WITH ANESTHESIA N/A 06/21/2020   Procedure:  RADIOLOGY WITH ANESTHESIA  STENTING;  Surgeon: Corrie Mckusick, DO;  Location: [facility];  Service: Anesthesiology;  Laterality: N/A;    There were no vitals filed for this visit.   Subjective Assessment - 07/19/20 1450     Subjective Reports continued L hand pain and swelling,  Saw Dr Earleen Newport and ordered a CT scan to his LUE for 6/20    Pertinent History 60 y.o. 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 s/p emergent cerebral angiogram with  successful thrombectomy with small volume SAH sylvian sulci; cocaine +    Limitations Standing    How long can you sit comfortably? unlimited    How long can you stand comfortably? <5 min    How long can you walk comfortably? <10 min    Bobby Branch Stated Goals To be able to walk again by myself                               [facility] Adult PT Treatment/Exercise - 07/19/20 0001  Transfers   Transfers Sit to Stand;Stand to Sit    Sit to Stand 4: Min guard    Stand to Sit 4: Min guard      Ambulation/Gait   Ambulation/Gait Yes    Ambulation/Gait Assistance 4: Min guard    Ambulation Distance (Feet) 115 Feet    Assistive device Hemi-walker    Gait Pattern Step-through pattern;Step-to pattern;Decreased step length - right      Knee/Hip Exercises: Stretches   Other Knee/Hip Stretches seated L hamstring stretch 2 min hold      Knee/Hip Exercises: Seated   Long Arc Quad Both;2 sets;15 reps    Heel Slides Strengthening;Both;2 sets;15 reps    Heel Slides Limitations towel to slide easier    Marching Both;2 sets;15 reps                    PT Education - 07/19/20 1541     Education Details Access Code: [email or web address]  URL: [email or web address]  Date: 07/19/2020  Prepared by: Sharlynn Oliphant    Exercises  Seated Heel Slide - 2 x daily - 7 x weekly - 2  sets - 15 reps  Seated March - 2 x daily - 7 x weekly - 2 sets - 15 reps  Seated Long Arc Quad - 2 x daily - 7 x weekly - 2 sets - 15 reps    Person(s) Educated Bobby Branch    Methods Explanation;Demonstration;Verbal cues;Tactile cues;Handout    Comprehension Verbalized understanding;Returned demonstration;Need further instruction              PT Short Term Goals - 07/12/20 1637       PT SHORT TERM GOAL #1   Title Bobby Branch to demo I in initial HEP    Baseline TBD    Time 4    Period Weeks    Status New    Target Date 09/13/20      PT SHORT TERM GOAL #2   Title Bobby Branch to transfer sit/stand under S only    Baseline SBA to perform sit/stand transfer.    Time 4    Period Weeks    Status New    Target Date 08/16/20      PT SHORT TERM GOAL #3   Title Bobby Branch to ambulate 265f with hemiwalker and S    Baseline 1179fwith hemiwalker and CGA    Time 4    Period Weeks    Status New    Target Date 08/16/20      PT SHORT TERM GOAL #4   Title Assess 5x STS time and set appropriate goal    Baseline TBD    Time 4    Period Weeks    Status New    Target Date 08/16/20      PT SHORT TERM GOAL #5   Title Assess FGA/DGI and set appropriate goal    Baseline TBD    Time 4    Period Weeks    Status New    Target Date 08/16/20               PT Long Term Goals - 07/12/20 1643       PT LONG TERM GOAL #1   Title Bobby Branch to ambulate 50026fith LRAD under S    Baseline 115f5fth hemiwalker and CGA    Time 8    Period Weeks    Status New    Target Date 09/13/20      PT LONG TERM GOAL #2  Title Bobby Branch to increase gait velocity to 0.4 m/s across 72m   Baseline Bobby Branch demos gait velocity of 0.26 m/s across 174m  Time 8    Period Weeks    Status New    Target Date 09/13/20      PT LONG TERM GOAL #3   Title Bobby Branch to demo 1/2 grade improvement in LLE strength throughout    Baseline LLE strength ranges fro 2-3+/5 throughout    Time 8    Period Weeks    Status New     Target Date 09/13/20      PT LONG TERM GOAL #4   Title Assess 5x STS progress    Baseline TBD    Time 8    Period Weeks    Status New    Target Date 09/13/20      PT LONG TERM GOAL #5   Title Assess FGA/DGI progress    Baseline TBD    Time 8    Period Weeks    Status New    Target Date 09/13/20      Additional Long Term Goals   Additional Long Term Goals Yes      PT LONG TERM GOAL #6   Title Improve FOTO score to 69    Baseline Initial FOTO score 55    Time 8    Period Weeks    Status New    Target Date 09/14/20                   Plan - 07/19/20 1543     Clinical Impression Statement Todays session focused on establishment of HEP, BLE strength and balance training, emphasis on TKE during gait and education to perfom exercises as instructed    Personal Factors and Comorbidities Comorbidity 2    Comorbidities CVA, DM    Examination-Activity Limitations Locomotion Level;Transfers    Stability/Clinical Decision Making Stable/Uncomplicated    Clinical Decision Making Moderate    Rehab Potential Good    PT Frequency 2x / week    PT Duration 8 weeks    PT Treatment/Interventions ADLs/Self Care Home Management;DME Instruction;Gait training;Stair training;Functional mobility training;Therapeutic activities;Therapeutic exercise;Balance training;Neuromuscular re-education;Bobby Branch/family education    PT Next Visit Plan continue with gait and balance training, determine best AD, 5x STS and assess DGI/FGA to set appropriate goals    PT Home Exercise Plan [email or web address]    Consulted and Agree with Plan of Care Bobby Branch             Bobby Branch will benefit from skilled therapeutic intervention in order to improve the following deficits and impairments:  Abnormal gait, Decreased coordination, Difficulty walking, Decreased endurance, Decreased activity tolerance, Decreased balance, Decreased mobility, Decreased strength  Visit Diagnosis: Unsteadiness on feet  Hemiplegia and  hemiparesis following cerebral infarction affecting right dominant side (HCC)  Right middle cerebral artery stroke ([facility]    Problem List Bobby Branch Active Problem List   Diagnosis Date Noted   Internal carotid artery stenosis, bilateral 06/21/2020   Slow transit constipation    Spastic hemiplegia affecting nondominant side (HCC)    Stage 3b chronic kidney disease ([place]   Essential hypertension    Controlled type 2 diabetes mellitus with hyperglycemia, without long-term current use of insulin ([place]   Protein-calorie malnutrition, severe 04/25/2020   Right middle cerebral artery stroke ([facility]03/21/2022   Acute right MCA stroke ([facility]/18/2022    JeLanice Shirts/16/2022, 3:51 PM  [facility] [address] [place], [place], [address] Phone: [phone]   Fax:  [phone]  Name: Lenora Mimms MRN: [id number] Date of Birth: 1960/10/26

## 2020-07-19 NOTE — Therapy (Signed)
[facility] [address] [place], [place], [address] Phone: [phone]   Fax:  [phone]  Physical Therapy Treatment  Patient Details  Name: Bobby Branch MRN: [id number] Date of Birth: [birth year] Referring Provider (PT): Danella Sensing NP   Encounter Date: 07/19/2020   PT End of Session - 07/19/20 1542     Visit Number 2    Number of Visits 17    Date for PT Re-Evaluation 09/13/20    Authorization Type [employer] Medicaid & [employer]    Authorization Time Period [employer] approved 12 visits then auth required for 13th visits and beyond    Authorization - Visit Number 12    Progress Note Due on Visit 10    PT Start Time [id number]    PT Stop Time 1530    PT Time Calculation (min) 45 min    Equipment Utilized During Treatment Gait belt    Activity Tolerance Patient tolerated treatment well    Behavior During Therapy WFL for tasks assessed/performed             Past Medical History:  Diagnosis Date   Anxiety    Depression    Diabetes mellitus without complication ([place])    type 2   Dyspnea    inhaler   GERD (gastroesophageal reflux disease)    HLD (hyperlipidemia)    Hypertension    Stroke ([place]) 09/2019    Past Surgical History:  Procedure Laterality Date   EYE SURGERY Left    IR ANGIO EXTRACRAN SEL COM CAROTID INNOMINATE UNI L MOD SED  04/20/2020   IR ANGIO INTRA EXTRACRAN SEL COM CAROTID INNOMINATE UNI L MOD SED  06/21/2020   IR ANGIO VERTEBRAL SEL SUBCLAVIAN INNOMINATE UNI R MOD SED  06/21/2020   IR CT HEAD LTD  04/20/2020   IR INTRAVSC STENT CERV CAROTID W/EMB-PROT MOD SED INCL ANGIO  06/21/2020   IR PERCUTANEOUS ART THROMBECTOMY/INFUSION INTRACRANIAL INC DIAG ANGIO  04/20/2020   IR RADIOLOGIST EVAL & MGMT  06/05/2020   IR RADIOLOGIST EVAL & MGMT  07/18/2020   IR US GUIDE VASC ACCESS RIGHT  04/20/2020   IR US GUIDE VASC ACCESS RIGHT  06/21/2020   RADIOLOGY WITH ANESTHESIA N/A 04/20/2020   Procedure: IR WITH  ANESTHESIA;  Surgeon: Luanne Bras, MD;  Location: [facility];  Service: Radiology;  Laterality: N/A;   RADIOLOGY WITH ANESTHESIA N/A 06/21/2020   Procedure: RADIOLOGY WITH ANESTHESIA  STENTING;  Surgeon: Corrie Mckusick, DO;  Location: [facility];  Service: Anesthesiology;  Laterality: N/A;    There were no vitals filed for this visit.   Subjective Assessment - 07/19/20 1450     Subjective Reports continued L hand pain and swelling,  Saw Dr Earleen Newport and ordered a CT scan to his LUE for 6/20    Pertinent History 60 y.o. 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 s/p emergent cerebral angiogram with  successful thrombectomy with small volume SAH sylvian sulci; cocaine +    Limitations Standing    How long can you sit comfortably? unlimited    How long can you stand comfortably? <5 min    How long can you walk comfortably? <10 min    Patient Stated Goals To be able to walk again by myself  [facility] Adult PT Treatment/Exercise - 07/19/20 0001       Transfers   Transfers Sit to Stand;Stand to Sit    Sit to Stand 4: Min guard    Stand to Sit 4: Min guard      Ambulation/Gait   Ambulation/Gait Yes    Ambulation/Gait Assistance 4: Min guard    Ambulation Distance (Feet) 115 Feet    Assistive device Hemi-walker    Gait Pattern Step-through pattern;Step-to pattern;Decreased step length - right      Knee/Hip Exercises: Stretches   Other Knee/Hip Stretches seated L hamstring stretch 2 min hold      Knee/Hip Exercises: Seated   Long Arc Quad Both;2 sets;15 reps    Heel Slides Strengthening;Both;2 sets;15 reps    Heel Slides Limitations towel to slide easier    Marching Both;2 sets;15 reps                 Balance Exercises - 07/19/20 0001       Balance Exercises: Standing    Other Standing Exercises Standing with hemiwalker, fwd step with R foot while L knee held in TKE facilitating WS fwd/bwd               PT Education - 07/19/20 1541     Education Details Access Code: [email or web address]  URL: [email or web address]  Date: 07/19/2020  Prepared by: Sharlynn Oliphant    Exercises  Seated Heel Slide - 2 x daily - 7 x weekly - 2 sets - 15 reps  Seated March - 2 x daily - 7 x weekly - 2 sets - 15 reps  Seated Long Arc Quad - 2 x daily - 7 x weekly - 2 sets - 15 reps    [name]    Methods Explanation;Demonstration;Verbal cues;Tactile cues;Handout    Comprehension Verbalized understanding;Returned demonstration;Need further instruction              PT Short Term Goals - 07/19/20 1555       PT SHORT TERM GOAL #1   Title Patient to demo I in initial HEP    Baseline TBD; 07/19/20 Initial HEP issued today    Time 4    Period Weeks    Status New    Target Date 09/13/20      PT SHORT TERM GOAL #2   Title Patient to transfer sit/stand under S only    Baseline SBA to perform sit/stand transfer.    Time 4    Period Weeks    Status New    Target Date 08/16/20      PT SHORT TERM GOAL #3   Title Patient to ambulate 283f with hemiwalker and S    Baseline 1166fwith hemiwalker and CGA    Time 4    Period Weeks    Status New    Target Date 08/16/20      PT SHORT TERM GOAL #4   Title Assess 5x STS time and set appropriate goal    Baseline TBD    Time 4    Period Weeks    Status New    Target Date 08/16/20      PT SHORT TERM GOAL #5   Title Assess FGA/DGI and set appropriate goal    Baseline TBD    Time 4    Period Weeks    Status New    Target Date 08/16/20               PT Long Term Goals -  07/12/20 1643       PT LONG TERM GOAL #1   Title Patient to ambulate 520f with LRAD under S    Baseline 1161fwith hemiwalker and CGA    Time 8    Period Weeks    Status New    Target Date 09/13/20      PT LONG TERM  GOAL #2   Title Patient to increase gait velocity to 0.4 m/s across 1091m Baseline Patient demos gait velocity of 0.26 m/s across 2m41mTime 8    Period Weeks    Status New    Target Date 09/13/20      PT LONG TERM GOAL #3   Title patient to demo 1/2 grade improvement in LLE strength throughout    Baseline LLE strength ranges fro 2-3+/5 throughout    Time 8    Period Weeks    Status New    Target Date 09/13/20      PT LONG TERM GOAL #4   Title Assess 5x STS progress    Baseline TBD    Time 8    Period Weeks    Status New    Target Date 09/13/20      PT LONG TERM GOAL #5   Title Assess FGA/DGI progress    Baseline TBD    Time 8    Period Weeks    Status New    Target Date 09/13/20      Additional Long Term Goals   Additional Long Term Goals Yes      PT LONG TERM GOAL #6   Title Improve FOTO score to 69    Baseline Initial FOTO score 55    Time 8    Period Weeks    Status New    Target Date 09/14/20                   Plan - 07/19/20 1543     Clinical Impression Statement Todays session focused on establishment of HEP, BLE strength and balance training, emphasis on TKE during gait and education to perfom exercises as instructed,  Demonstartes lack of TKE on heelstrike, but is able to swing RLE through w/o scuff using hemiwalker    Personal Factors and Comorbidities Comorbidity 2    Comorbidities CVA, DM    Examination-Activity Limitations Locomotion Level;Transfers    Stability/Clinical Decision Making Stable/Uncomplicated    Clinical Decision Making Moderate    Rehab Potential Good    PT Frequency 2x / week    PT Duration 8 weeks    PT Treatment/Interventions ADLs/Self Care Home Management;DME Instruction;Gait training;Stair training;Functional mobility training;Therapeutic activities;Therapeutic exercise;Balance training;Neuromuscular re-education;Patient/family education    PT Next Visit Plan continue with gait and balance training, determine best  AD, 5x STS and assess DGI/FGA to set appropriate goals    PT Home Exercise Plan [email or web address]    Consulted and Agree with Plan of Care Patient             Patient will benefit from skilled therapeutic intervention in order to improve the following deficits and impairments:  Abnormal gait, Decreased coordination, Difficulty walking, Decreased endurance, Decreased activity tolerance, Decreased balance, Decreased mobility, Decreased strength  Visit Diagnosis: Unsteadiness on feet  Hemiplegia and hemiparesis following cerebral infarction affecting right dominant side (HCC)  [facility]  Problem List Patient Active Problem List   Diagnosis Date Noted   Internal carotid artery stenosis, bilateral 06/21/2020  Slow transit constipation    Spastic hemiplegia affecting nondominant side (HCC)    Stage 3b chronic kidney disease ([place])    Essential hypertension    Controlled type 2 diabetes mellitus with hyperglycemia, without long-term current use of insulin ([place])    Protein-calorie malnutrition, severe 04/25/2020   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

## 2020-07-20 ENCOUNTER — Other Ambulatory Visit: Payer: Self-pay

## 2020-07-23 ENCOUNTER — Ambulatory Visit: Payer: Federal, State, Local not specified - PPO

## 2020-07-23 ENCOUNTER — Other Ambulatory Visit (HOSPITAL_COMMUNITY): Payer: Self-pay

## 2020-07-23 ENCOUNTER — Other Ambulatory Visit: Payer: Self-pay

## 2020-07-23 ENCOUNTER — Ambulatory Visit
Admission: RE | Admit: 2020-07-23 | Discharge: 2020-07-23 | Disposition: A | Discharge status: Home / Self Care | Payer: Federal, State, Local not specified - PPO | Source: Ambulatory Visit | Attending: Interventional Radiology | Admitting: Interventional Radiology

## 2020-07-23 DIAGNOSIS — R2681 Unsteadiness on feet: Secondary | ICD-10-CM | POA: Diagnosis not present

## 2020-07-23 DIAGNOSIS — M7989 Other specified soft tissue disorders: Secondary | ICD-10-CM

## 2020-07-23 DIAGNOSIS — I69351 Hemiplegia and hemiparesis following cerebral infarction affecting right dominant side: Secondary | ICD-10-CM

## 2020-07-23 DIAGNOSIS — I69354 Hemiplegia and hemiparesis following cerebral infarction affecting left non-dominant side: Secondary | ICD-10-CM

## 2020-07-23 NOTE — Therapy (Signed)
[facility] [address] [place], [place], [address] Phone: [phone]   Fax:  [phone]  Physical Therapy Treatment  Patient Details  Name: Bobby Branch MRN: [id number] Date of Birth: 1960-08-10 Referring Provider (PT): Danella Sensing NP   Encounter Date: 07/23/2020   PT End of Session - 07/23/20 1333     Visit Number 3    Number of Visits 17    Date for PT Re-Evaluation 09/13/20    Authorization Type [employer] Medicaid & [employer]    Authorization Time Period [employer] approved 12 visits then auth required for 13th visits and beyond    Authorization - Visit Number 12    Progress Note Due on Visit 10    PT Start Time 1230    PT Stop Time 1315    PT Time Calculation (min) 45 min    Equipment Utilized During Treatment Gait belt    Activity Tolerance Patient tolerated treatment well    Behavior During Therapy Impulsive             Past Medical History:  Diagnosis Date   Anxiety    Depression    Diabetes mellitus without complication ([place])    type 2   Dyspnea    inhaler   GERD (gastroesophageal reflux disease)    HLD (hyperlipidemia)    Hypertension    Stroke ([place]) 09/2019    Past Surgical History:  Procedure Laterality Date   EYE SURGERY Left    IR ANGIO EXTRACRAN SEL COM CAROTID INNOMINATE UNI L MOD SED  04/20/2020   IR ANGIO INTRA EXTRACRAN SEL COM CAROTID INNOMINATE UNI L MOD SED  06/21/2020   IR ANGIO VERTEBRAL SEL SUBCLAVIAN INNOMINATE UNI R MOD SED  06/21/2020   IR CT HEAD LTD  04/20/2020   IR INTRAVSC STENT CERV CAROTID W/EMB-PROT MOD SED INCL ANGIO  06/21/2020   IR PERCUTANEOUS ART THROMBECTOMY/INFUSION INTRACRANIAL INC DIAG ANGIO  04/20/2020   IR RADIOLOGIST EVAL & MGMT  06/05/2020   IR RADIOLOGIST EVAL & MGMT  07/18/2020   IR US GUIDE VASC ACCESS RIGHT  04/20/2020   IR US GUIDE VASC ACCESS RIGHT  06/21/2020   RADIOLOGY WITH ANESTHESIA N/A 04/20/2020   Procedure: IR WITH ANESTHESIA;  Surgeon:  Luanne Bras, MD;  Location: [facility];  Service: Radiology;  Laterality: N/A;   RADIOLOGY WITH ANESTHESIA N/A 06/21/2020   Procedure: RADIOLOGY WITH ANESTHESIA  STENTING;  Surgeon: Corrie Mckusick, DO;  Location: [facility];  Service: Anesthesiology;  Laterality: N/A;    There were no vitals filed for this visit.   Subjective Assessment - 07/23/20 1237     Subjective L hand swelling and pain improved, partially compliant with HEP    Pertinent History 60 y.o. 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 s/p emergent cerebral angiogram with  successful thrombectomy with small volume SAH sylvian sulci; cocaine +    Limitations Standing    How long can you sit comfortably? unlimited    How long can you stand comfortably? <5 min    How long can you walk comfortably? <10 min    Patient Stated Goals To be able to walk again by myself    Pain Onset 1 to 4 weeks ago                [facility] PT Assessment -  07/23/20 0001       Transfers   Five time sit to stand comments  26.84s      Dynamic Gait Index   Level Surface Normal    Change in Gait Speed Mild Impairment    Gait with Horizontal Head Turns Mild Impairment    Gait with Vertical Head Turns Mild Impairment    Gait and Pivot Turn Mild Impairment    Step Over Obstacle Moderate Impairment    Step Around Obstacles Mild Impairment    Steps Mild Impairment    Total Score 16    DGI comment: performed w/o AD                           [facility] Adult PT Treatment/Exercise - 07/23/20 0001       Transfers   Transfers Sit to Stand;Stand to Sit    Sit to Stand 4: Min guard      Ambulation/Gait   Ambulation/Gait Yes    Ambulation/Gait Assistance 4: Min guard    Ambulation Distance (Feet) 115 Feet    Assistive device None    Gait Pattern Step-through pattern;Step-to  pattern;Decreased step length - right      Lumbar Exercises: Supine   Heel Slides 15 reps;Limitations    Heel Slides Limitations 2x15 with towel for resistance    Bridge 15 reps;Limitations    Bridge Limitations 2x15    Other Supine Lumbar Exercises B bent knee fallouts, 2x15    Other Supine Lumbar Exercises LTR 15x      Knee/Hip Exercises: Seated   Long Arc Quad Both;2 sets;15 reps    Long Arc Quad Limitations performed with ball squeeze                      PT Short Term Goals - 07/23/20 1242       PT SHORT TERM GOAL #1   Title Patient to demo I in initial HEP    Baseline TBD; 07/19/20 Initial HEP issued today    Time 4    Period Weeks    Status New    Target Date 09/13/20      PT SHORT TERM GOAL #2   Title Patient to transfer sit/stand under S only    Baseline SBA to perform sit/stand transfer.    Time 4    Period Weeks    Status New    Target Date 08/16/20      PT SHORT TERM GOAL #3   Title Patient to ambulate 271f with hemiwalker and S    Baseline 1176fwith hemiwalker and CGA    Time 4    Period Weeks    Status New    Target Date 08/16/20      PT SHORT TERM GOAL #4   Title Assess 5x STS time and set appropriate goal    Baseline TBD; 07/23/20 5x STS 26.8s    Time 4    Period Weeks    Status New    Target Date 08/16/20      PT SHORT TERM GOAL #5   Title Assess FGA/DGI and set appropriate goal    Baseline TBD; 07/23/20 DGI score 16, goal is 20    Time 4    Period Weeks    Status New    Target Date 08/16/20               PT Long Term Goals - 07/12/20 1643  PT LONG TERM GOAL #1   Title Patient to ambulate 54f with LRAD under S    Baseline 1124fwith hemiwalker and CGA    Time 8    Period Weeks    Status New    Target Date 09/13/20      PT LONG TERM GOAL #2   Title Patient to increase gait velocity to 0.4 m/s across 1055m Baseline Patient demos gait velocity of 0.26 m/s across 44m5mTime 8    Period Weeks    Status  New    Target Date 09/13/20      PT LONG TERM GOAL #3   Title patient to demo 1/2 grade improvement in LLE strength throughout    Baseline LLE strength ranges fro 2-3+/5 throughout    Time 8    Period Weeks    Status New    Target Date 09/13/20      PT LONG TERM GOAL #4   Title Assess 5x STS progress    Baseline TBD    Time 8    Period Weeks    Status New    Target Date 09/13/20      PT LONG TERM GOAL #5   Title Assess FGA/DGI progress    Baseline TBD    Time 8    Period Weeks    Status New    Target Date 09/13/20      Additional Long Term Goals   Additional Long Term Goals Yes      PT LONG TERM GOAL #6   Title Improve FOTO score to 69    Baseline Initial FOTO score 55    Time 8    Period Weeks    Status New    Target Date 09/14/20                   Plan - 07/23/20 1310     Clinical Impression Statement Todays session addressed DGI and 5x STS wit oals being set, testing performed w/o need of AD, addedd additional supine tasks to improve LLE strength and coordianation, L shoulder pain effecting ability to lay supine w/o support under L elbow.  Transfer ability has improved and patient compensates well for deficits.    Personal Factors and Comorbidities Comorbidity 2    Comorbidities CVA, DM    Examination-Activity Limitations Locomotion Level;Transfers    Stability/Clinical Decision Making Stable/Uncomplicated    Rehab Potential Good    PT Frequency 2x / week    PT Duration 8 weeks    PT Treatment/Interventions ADLs/Self Care Home Management;DME Instruction;Gait training;Stair training;Functional mobility training;Therapeutic activities;Therapeutic exercise;Balance training;Neuromuscular re-education;Patient/family education    PT Next Visit Plan standing balance tasks, TKEs, ambulation with AFO?    PT Home Exercise Plan [email or web address]    Consulted and Agree with Plan of Care Patient             Patient will benefit from skilled therapeutic intervention  in order to improve the following deficits and impairments:  Abnormal gait, Decreased coordination, Difficulty walking, Decreased endurance, Decreased activity tolerance, Decreased balance, Decreased mobility, Decreased strength  Visit Diagnosis: Spastic 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 (HCC)  Unsteadiness on feet  Hemiplegia and hemiparesis following cerebral infarction affecting right dominant side ([facility]  Problem List Patient Active Problem List   Diagnosis Date Noted   Internal carotid artery stenosis, bilateral 06/21/2020   Slow transit constipation    Spastic hemiplegia  affecting nondominant side ([place])    Stage 3b chronic kidney disease ([place])    Essential hypertension    Controlled type 2 diabetes mellitus with hyperglycemia, without long-term current use of insulin ([place])    Protein-calorie malnutrition, severe 04/25/2020   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 [place], [place], [address] Phone: [phone]   Fax:  [phone]  Name: Bobby Branch MRN: [id number] Date of Birth: Nov 14, 1960

## 2020-07-25 ENCOUNTER — Ambulatory Visit: Payer: Federal, State, Local not specified - PPO

## 2020-07-27 ENCOUNTER — Ambulatory Visit: Payer: Federal, State, Local not specified - PPO | Admitting: Physical Therapy

## 2020-07-27 ENCOUNTER — Other Ambulatory Visit: Payer: Self-pay

## 2020-07-27 ENCOUNTER — Telehealth: Payer: Self-pay | Admitting: Physical Therapy

## 2020-07-27 NOTE — Telephone Encounter (Signed)
Called pt in regards to missed visit 07/27/20 at 10:15am.  No pick up. Mailbox full, unable to leave a message to reschedule or remind him about his next appointment.  Johara Lodwick April Gordy Levan, PT, DPT

## 2020-07-27 NOTE — Patient Outreach (Signed)
[facility]) Care Management  07/27/2020  Bobby Branch Apr 24, 1960 [id number]   Telephone outreach to patient to obtain mRS was successfully completed. MRS= 3.  Thank you, [facility] Care Management Assistant

## 2020-07-30 ENCOUNTER — Other Ambulatory Visit: Payer: Self-pay

## 2020-07-30 ENCOUNTER — Ambulatory Visit: Payer: Federal, State, Local not specified - PPO | Admitting: Occupational Therapy

## 2020-07-30 ENCOUNTER — Ambulatory Visit: Payer: Federal, State, Local not specified - PPO

## 2020-07-30 ENCOUNTER — Encounter: Payer: Self-pay | Admitting: Occupational Therapy

## 2020-07-30 DIAGNOSIS — G8929 Other chronic pain: Secondary | ICD-10-CM

## 2020-07-30 DIAGNOSIS — R2681 Unsteadiness on feet: Secondary | ICD-10-CM | POA: Diagnosis not present

## 2020-07-30 DIAGNOSIS — I69354 Hemiplegia and hemiparesis following cerebral infarction affecting left non-dominant side: Secondary | ICD-10-CM

## 2020-07-30 DIAGNOSIS — I69351 Hemiplegia and hemiparesis following cerebral infarction affecting right dominant side: Secondary | ICD-10-CM

## 2020-07-30 DIAGNOSIS — M25612 Stiffness of left shoulder, not elsewhere classified: Secondary | ICD-10-CM

## 2020-07-30 DIAGNOSIS — M25642 Stiffness of left hand, not elsewhere classified: Secondary | ICD-10-CM

## 2020-07-30 DIAGNOSIS — M25512 Pain in left shoulder: Secondary | ICD-10-CM

## 2020-07-30 NOTE — Therapy (Signed)
[facility] [address] [place], [place], [address] Phone: [phone]   Fax:  [phone]  Occupational Therapy Treatment  Patient Details  Name: Bobby Branch MRN: [id number] Date of Birth: March 23, 1960 Referring Provider (OT): Danella Sensing   Encounter Date: 07/30/2020   OT End of Session - 07/30/20 1323     Visit Number 2    Number of Visits 13    Authorization Type Medicaid - pending auth    OT Start Time 1320    OT Stop Time 1400    OT Time Calculation (min) 40 min    Activity Tolerance Patient tolerated treatment well    Behavior During Therapy Impulsive             Past Medical History:  Diagnosis Date   Anxiety    Depression    Diabetes mellitus without complication ([place])    type 2   Dyspnea    inhaler   GERD (gastroesophageal reflux disease)    HLD (hyperlipidemia)    Hypertension    Stroke ([place]) 09/2019    Past Surgical History:  Procedure Laterality Date   EYE SURGERY Left    IR ANGIO EXTRACRAN SEL COM CAROTID INNOMINATE UNI L MOD SED  04/20/2020   IR ANGIO INTRA EXTRACRAN SEL COM CAROTID INNOMINATE UNI L MOD SED  06/21/2020   IR ANGIO VERTEBRAL SEL SUBCLAVIAN INNOMINATE UNI R MOD SED  06/21/2020   IR CT HEAD LTD  04/20/2020   IR INTRAVSC STENT CERV CAROTID W/EMB-PROT MOD SED INCL ANGIO  06/21/2020   IR PERCUTANEOUS ART THROMBECTOMY/INFUSION INTRACRANIAL INC DIAG ANGIO  04/20/2020   IR RADIOLOGIST EVAL & MGMT  06/05/2020   IR RADIOLOGIST EVAL & MGMT  07/18/2020   IR US GUIDE VASC ACCESS RIGHT  04/20/2020   IR US GUIDE VASC ACCESS RIGHT  06/21/2020   RADIOLOGY WITH ANESTHESIA N/A 04/20/2020   Procedure: IR WITH ANESTHESIA;  Surgeon: Luanne Bras, MD;  Location: [facility];  Service: Radiology;  Laterality: N/A;   RADIOLOGY WITH ANESTHESIA N/A 06/21/2020   Procedure: RADIOLOGY WITH ANESTHESIA  STENTING;  Surgeon: Corrie Mckusick, DO;  Location: [facility];  Service: Anesthesiology;  Laterality: N/A;     There were no vitals filed for this visit.   Subjective Assessment - 07/30/20 1320     Subjective  Pt reports that L hand and shoulder hurt    Pertinent History R MCA stroke.  PMH:  CKD, DM, malnutrition, HTN, polysubstance abuse, prior stroke 08/21 who presents with spastic left sided hemiplegia    Currently in Pain? Yes    Pain Score 4     Pain Location Shoulder   and hand   Pain Orientation Left    Pain Descriptors / Indicators Burning   finger, shoulder-sharp   Pain Type Acute pain    Pain Onset 1 to 4 weeks ago    Pain Frequency Intermittent    Aggravating Factors  movement    Pain Relieving Factors proper positioning                Pt reports that current pre-fab splint not fitting well and increasing edema so he hasn't been wearing.  Initiated fabrication of L custom resting hand splint for LUE for improved positioning, to assist with edema management and pain.--Will complete next session.  (Noted pt tends to deviate ulnarly at wrist which may be contributing to wrist pain)          OT Education - 07/30/20 1416  Education Details Reviewed edema management techniques.  Instructed in initial stretching HEP for LUE--see pt instructions    [name]    Methods Explanation;Demonstration;Tactile cues;Verbal cues    Comprehension Verbalized understanding;Returned demonstration;Need further instruction;Verbal cues required              OT Short Term Goals - 07/19/20 1710       OT SHORT TERM GOAL #1   Title Patient will complete an HEP designed to improve range of motion and decrease pain in LUE    Baseline No HEP    Time 4    Period Weeks    Status New    Target Date 09/02/20      OT SHORT TERM GOAL #2   Title Patient will demonstrate sufficient shoulder range of motion to apply deodorant and wash under left arm with pain no greater than 1-2/10    Baseline Difficulty washing and applying deodorant pain 6+/10    Time 4    Period  Weeks    Status New      OT SHORT TERM GOAL #3   Title Patient will demonstrate 5 degrees of active wrist flex or extension following facilitation    Baseline no volitional wrist movement    Time 4    Period Weeks    Status New      OT SHORT TERM GOAL #4   Title Patient will demonstrate 90% composite wrist flexion/extension passively without pain of more than 1-2/10    Baseline Patient unable to tolerate passive stretch to fingers    Time 4    Period Weeks    Status New      OT SHORT TERM GOAL #5   Title Patient will demonstrate 90 degrees of passive shoulder flexion in preparation for forward reaching    Baseline 70*    Time 4    Period Weeks    Status New               OT Long Term Goals - 07/19/20 1714       OT LONG TERM GOAL #1   Title Patient will complete updated HEP designed to improve functional use of LUE    Baseline No HEP    Time 6    Period Weeks    Status New    Target Date 09/17/20      OT LONG TERM GOAL #2   Title Patient will don/doff left UE positioning devices as warranted    Baseline No current splints or slings    Time 6    Period Weeks    Status New      OT LONG TERM GOAL #3   Title Patient will demonstrate ability to cut food on plate    Baseline Unable to cut up food    Time 6    Period Weeks    Status New      OT LONG TERM GOAL #4   Title Patient will demonstrate ability to peel/chop vegetables as needed for soup    Baseline Unable to cut veggies    Time 6    Period Weeks    Status New      OT LONG TERM GOAL #5   Title Patient will demonstrate ability to button/zipper clothing as needed    Baseline Opts to wear clothing without fasteners    Time 6    Period Weeks    Status New  Plan - 07/30/20 1323     Clinical Impression Statement Pt progressing towards goals with decr spasticity, improved ROM, and edema after stretching.    OT Occupational Profile and History Detailed Assessment- Review of  Records and additional review of physical, cognitive, psychosocial history related to current functional performance    Occupational performance deficits (Please refer to evaluation for details): ADL's;IADL's;Rest and Sleep    Body Structure / Function / Physical Skills ADL;Dexterity;Flexibility;Muscle spasms;ROM;Strength;Vision;Tone;IADL;FMC;Edema;Coordination;Balance;Body mechanics;Decreased knowledge of precautions;Endurance;Improper spinal/pelvic alignment;Pain;Sensation;Decreased knowledge of use of DME;GMC;Mobility;Proprioception    Cognitive Skills Attention;Memory;Perception;Energy/Drive;Problem Solve;Safety Awareness    Rehab Potential Good    Clinical Decision Making Several treatment options, min-mod task modification necessary    Comorbidities Affecting Occupational Performance: May have comorbidities impacting occupational performance    Modification or Assistance to Complete Evaluation  Min-Moderate modification of tasks or assist with assess necessary to complete eval    OT Frequency 2x / week    OT Duration 6 weeks    OT Treatment/Interventions Self-care/ADL training;Therapeutic exercise;DME and/or AE instruction;Functional Mobility Training;Cognitive remediation/compensation;Balance training;Visual/perceptual remediation/compensation;Splinting;Manual Therapy;Neuromuscular education;Fluidtherapy;Electrical Stimulation;Aquatic Barista;Iontophoresis;Passive range of motion;Therapeutic activities;Patient/family education    Plan complete L resting hand splint, review stretching HEP, LUE neuro re-ed    Consulted and Agree with Plan of Care Patient             Patient will benefit from skilled therapeutic intervention in order to improve the following deficits and impairments:   Body Structure / Function / Physical Skills: ADL, Dexterity, Flexibility, Muscle spasms, ROM, Strength, Vision, Tone, IADL, FMC, Edema, Coordination, Balance, Body mechanics, Decreased  knowledge of precautions, Endurance, Improper spinal/pelvic alignment, Pain, Sensation, Decreased knowledge of use of DME, GMC, Mobility, Proprioception Cognitive Skills: Attention, Memory, Perception, Energy/Drive, Problem Solve, Safety Awareness     Visit Diagnosis: Spastic 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 (HCC)  Chronic left shoulder pain  Stiffness of left shoulder, not elsewhere classified  Stiffness of left hand, not elsewhere classified  Left shoulder pain, unspecified chronicity    Problem List Patient Active Problem List   Diagnosis Date Noted   Internal carotid artery stenosis, bilateral 06/21/2020   Slow transit constipation    Spastic hemiplegia affecting nondominant side (HCC)    Stage 3b chronic kidney disease ([place])    Essential hypertension    Controlled type 2 diabetes mellitus with hyperglycemia, without long-term current use of insulin ([place])    Protein-calorie malnutrition, severe 04/25/2020   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 [place], [place]  [address] [phone] phone [phone] 07/30/20 2:21 PM

## 2020-07-30 NOTE — Patient Instructions (Addendum)
    PERFORM ALL SLOWLY AND ONLY AS FAR AS YOU CAN WITHOUT PAIN!     Table Top Stretch (Static)    Sitting at table, slide arm across table using other arm to help if needed. Hold 5-10 seconds. Repeat 10 times then perform side-to-side. Do 3-4 sessions per day.    Elbow: Extension    Clasp hands and then lean over to stretch down/out elbow between legs  Hold 10 seconds. Repeat 10 times. Do 3-4 sessions per day. CAUTION: Stretch slowly and gently. Do not force joint.   Extension (Passive)    Using other hand, lift hand at wrist as far as possible. Hold 5-10 seconds. Repeat 10 times. Do 3-4 sessions per day.   Supination (Passive)    Keep elbow bent at right angle and held firmly at side. Use other hand to turn forearm until palm faces upward. Hold 10 seconds. Repeat 10 times. Do 3-4 sessions per day.    FINGERS: Extension    Use opposite hand to straighten fingers. Do not bend knuckles backwards. Hold 10 seconds. 10 reps per set, 3-4 times per day   MP / PIP / DIP Composite Flexion (Passive Stretch)    Use other hand to bend fingers at all three joints. Hold 10 seconds. Repeat 10 times. Do 3-4 sessions per day.     Composite Flexion (Passive)    Use other hand to bend both joints of thumb at the same time. Hold 5 seconds.  Then, stretch out thumb away from palm. Repeat 10 times. Do 3-4 sessions per day.

## 2020-07-30 NOTE — Therapy (Signed)
[facility] [address] [place], [place], [address] Phone: [phone]   Fax:  [phone]  Physical Therapy Treatment  Patient Details  Name: Bobby Branch MRN: [id number] Date of Birth: [birth year] Referring Provider (PT): Danella Sensing NP   Encounter Date: 07/30/2020   PT End of Session - 07/30/20 1236     Visit Number 4    Number of Visits 17    Date for PT Re-Evaluation 09/13/20    Authorization Type [employer] Medicaid & [employer]    Authorization Time Period [employer] approved 12 visits then auth required for 13th visits and beyond    Authorization - Visit Number 12    Progress Note Due on Visit 10    PT Start Time 1230    PT Stop Time 1315    PT Time Calculation (min) 45 min    Equipment Utilized During Treatment Gait belt    Activity Tolerance Patient tolerated treatment well    Behavior During Therapy Impulsive             Past Medical History:  Diagnosis Date   Anxiety    Depression    Diabetes mellitus without complication ([facility])    type 2   Dyspnea    inhaler   GERD (gastroesophageal reflux disease)    HLD (hyperlipidemia)    Hypertension    Stroke ([facility]) 09/2019    Past Surgical History:  Procedure Laterality Date   EYE SURGERY Left    IR ANGIO EXTRACRAN SEL COM CAROTID INNOMINATE UNI L MOD SED  04/20/2020   IR ANGIO INTRA EXTRACRAN SEL COM CAROTID INNOMINATE UNI L MOD SED  06/21/2020   IR ANGIO VERTEBRAL SEL SUBCLAVIAN INNOMINATE UNI R MOD SED  06/21/2020   IR CT HEAD LTD  04/20/2020   IR INTRAVSC STENT CERV CAROTID W/EMB-PROT MOD SED INCL ANGIO  06/21/2020   IR PERCUTANEOUS ART THROMBECTOMY/INFUSION INTRACRANIAL INC DIAG ANGIO  04/20/2020   IR RADIOLOGIST EVAL & MGMT  06/05/2020   IR RADIOLOGIST EVAL & MGMT  07/18/2020   IR US GUIDE VASC ACCESS RIGHT  04/20/2020   IR US GUIDE VASC ACCESS RIGHT  06/21/2020   RADIOLOGY WITH ANESTHESIA N/A 04/20/2020   Procedure: IR WITH ANESTHESIA;  Surgeon:  Luanne Bras, MD;  Location: [facility];  Service: Radiology;  Laterality: N/A;   RADIOLOGY WITH ANESTHESIA N/A 06/21/2020   Procedure: RADIOLOGY WITH ANESTHESIA  STENTING;  Surgeon: Corrie Mckusick, DO;  Location: [facility];  Service: Anesthesiology;  Laterality: N/A;    There were no vitals filed for this visit.   Subjective Assessment - 07/30/20 1234     Subjective No falls to report, hasdifficulty sleeping due to L shoulder pain    Pertinent History 60 y.o. 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 s/p emergent cerebral angiogram with  successful thrombectomy with small volume SAH sylvian sulci; cocaine +    Limitations Standing    How long can you sit comfortably? unlimited    How long can you stand comfortably? <5 min    How long can you walk comfortably? <10 min    Patient Stated Goals To be able to walk again by myself    Pain Onset 1 to 4 weeks ago  [facility] Adult PT Treatment/Exercise - 07/30/20 0001       Ambulation/Gait   Ambulation/Gait Yes    Ambulation/Gait Assistance 4: Min guard    Ambulation Distance (Feet) 230 Feet    Assistive device Straight cane    Gait Pattern Step-through pattern;Step-to pattern;Decreased step length - right    Gait Comments trial of L AFO      Lumbar Exercises: Supine   Heel Slides 15 reps;Limitations    Heel Slides Limitations 2x15 with towel for resistance      Knee/Hip Exercises: Standing   Terminal Knee Extension Strengthening;Left;1 set;15 reps;Theraband    Theraband Level (Terminal Knee Extension) Level 2 (Red)      Knee/Hip Exercises: Seated   Long Arc Quad Both;2 sets;15 reps    Long Arc Quad Limitations performed with ball squeeze                 Balance Exercises - 07/30/20 0001       Balance Exercises:  Standing   Stepping Strategy Anterior;UE support;Limitations    Stepping Strategy Limitations with L knee braced by PT, fwd stepping with RLE 2x15    Other Standing Exercises standing with L knee braced for cross overs, R over L, 2x15                 PT Short Term Goals - 07/23/20 1242       PT SHORT TERM GOAL #1   Title Patient to demo I in initial HEP    Baseline TBD; 07/19/20 Initial HEP issued today    Time 4    Period Weeks    Status New    Target Date 09/13/20      PT SHORT TERM GOAL #2   Title Patient to transfer sit/stand under S only    Baseline SBA to perform sit/stand transfer.    Time 4    Period Weeks    Status New    Target Date 08/16/20      PT SHORT TERM GOAL #3   Title Patient to ambulate 274f with hemiwalker and S    Baseline 1178fwith hemiwalker and CGA    Time 4    Period Weeks    Status New    Target Date 08/16/20      PT SHORT TERM GOAL #4   Title Assess 5x STS time and set appropriate goal    Baseline TBD; 07/23/20 5x STS 26.8s    Time 4    Period Weeks    Status New    Target Date 08/16/20      PT SHORT TERM GOAL #5   Title Assess FGA/DGI and set appropriate goal    Baseline TBD; 07/23/20 DGI score 16, goal is 20    Time 4    Period Weeks    Status New    Target Date 08/16/20               PT Long Term Goals - 07/12/20 1643       PT LONG TERM GOAL #1   Title Patient to ambulate 50047fith LRAD under S    Baseline 115f19fth hemiwalker and CGA    Time 8    Period Weeks    Status New    Target Date 09/13/20      PT LONG TERM GOAL #2   Title Patient to increase gait velocity to 0.4 m/s across 34m 53maseline Patient demos gait velocity of 0.26 m/s across  2m   Time 8    Period Weeks    Status New    Target Date 09/13/20      PT LONG TERM GOAL #3   Title patient to demo 1/2 grade improvement in LLE strength throughout    Baseline LLE strength ranges fro 2-3+/5 throughout    Time 8    Period Weeks    Status New     Target Date 09/13/20      PT LONG TERM GOAL #4   Title Assess 5x STS progress    Baseline TBD    Time 8    Period Weeks    Status New    Target Date 09/13/20      PT LONG TERM GOAL #5   Title Assess FGA/DGI progress    Baseline TBD    Time 8    Period Weeks    Status New    Target Date 09/13/20      Additional Long Term Goals   Additional Long Term Goals Yes      PT LONG TERM GOAL #6   Title Improve FOTO score to 69    Baseline Initial FOTO score 55    Time 8    Period Weeks    Status New    Target Date 09/14/20                   Plan - 07/30/20 1315     Clinical Impression Statement Todays session focused on standing exercises promoting TKE and stepping tasks, L AFO used for session which eliminated foot scuff when ambulating, patient requiring frequent positioning of L foot to maintain neutral, added crossovers to promote WC and L hip IR    Personal Factors and Comorbidities Comorbidity 2    Comorbidities CVA, DM    Examination-Activity Limitations Locomotion Level;Transfers    Stability/Clinical Decision Making Stable/Uncomplicated    Rehab Potential Good    PT Frequency 2x / week    PT Duration 8 weeks    PT Treatment/Interventions ADLs/Self Care Home Management;DME Instruction;Gait training;Stair training;Functional mobility training;Therapeutic activities;Therapeutic exercise;Balance training;Neuromuscular re-education;Patient/family education    PT Next Visit Plan standing balance tasks, TKEs, ambulation with AFO ot facilitate knee extension    PT Home Exercise Plan [id number]   Consulted and Agree with Plan of Care Patient             Patient will benefit from skilled therapeutic intervention in order to improve the following deficits and impairments:  Abnormal gait, Decreased coordination, Difficulty walking, Decreased endurance, Decreased activity tolerance, Decreased balance, Decreased mobility, Decreased strength  Visit Diagnosis: Spastic  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 (HCC)  Unsteadiness on feet  Hemiplegia and hemiparesis following cerebral infarction affecting right dominant side ([facility]     Problem List Patient Active Problem List   Diagnosis Date Noted   Internal carotid artery stenosis, bilateral 06/21/2020   Slow transit constipation    Spastic hemiplegia affecting nondominant side (HCC)    Stage 3b chronic kidney disease ([place]    Essential hypertension    Controlled type 2 diabetes mellitus with hyperglycemia, without long-term current use of insulin ([place]    Protein-calorie malnutrition, severe 04/25/2020   Right middle cerebral artery stroke ([place] 04/23/2020   Acute right MCA stroke ([place] 04/20/2020    JLanice ShirtsPT 07/30/2020, 1:31 PM  [place] [address]Phone: [phone]  Fax:  [phone]  Name: Bobby Branch MRN: [id number] Date of Birth: [birth year]

## 2020-08-01 ENCOUNTER — Ambulatory Visit: Payer: Federal, State, Local not specified - PPO

## 2020-08-01 ENCOUNTER — Encounter: Payer: Self-pay | Admitting: Neurology

## 2020-08-01 ENCOUNTER — Inpatient Hospital Stay: Payer: MEDICAID | Admitting: Neurology

## 2020-08-02 ENCOUNTER — Ambulatory Visit: Payer: Federal, State, Local not specified - PPO | Admitting: Occupational Therapy

## 2020-08-07 ENCOUNTER — Other Ambulatory Visit: Payer: Self-pay

## 2020-08-07 ENCOUNTER — Encounter: Payer: Self-pay | Admitting: Occupational Therapy

## 2020-08-07 ENCOUNTER — Ambulatory Visit: Payer: Federal, State, Local not specified - PPO | Attending: Registered Nurse | Admitting: Occupational Therapy

## 2020-08-07 DIAGNOSIS — M25612 Stiffness of left shoulder, not elsewhere classified: Secondary | ICD-10-CM | POA: Diagnosis present

## 2020-08-07 DIAGNOSIS — R1310 Dysphagia, unspecified: Secondary | ICD-10-CM | POA: Insufficient documentation

## 2020-08-07 DIAGNOSIS — M25512 Pain in left shoulder: Secondary | ICD-10-CM | POA: Insufficient documentation

## 2020-08-07 DIAGNOSIS — R471 Dysarthria and anarthria: Secondary | ICD-10-CM | POA: Insufficient documentation

## 2020-08-07 DIAGNOSIS — M25642 Stiffness of left hand, not elsewhere classified: Secondary | ICD-10-CM | POA: Diagnosis present

## 2020-08-07 DIAGNOSIS — R41841 Cognitive communication deficit: Secondary | ICD-10-CM | POA: Diagnosis present

## 2020-08-07 DIAGNOSIS — G8929 Other chronic pain: Secondary | ICD-10-CM | POA: Insufficient documentation

## 2020-08-07 DIAGNOSIS — I69351 Hemiplegia and hemiparesis following cerebral infarction affecting right dominant side: Secondary | ICD-10-CM | POA: Diagnosis present

## 2020-08-07 DIAGNOSIS — I69354 Hemiplegia and hemiparesis following cerebral infarction affecting left non-dominant side: Secondary | ICD-10-CM | POA: Insufficient documentation

## 2020-08-07 DIAGNOSIS — R2681 Unsteadiness on feet: Secondary | ICD-10-CM | POA: Insufficient documentation

## 2020-08-07 DIAGNOSIS — R41840 Attention and concentration deficit: Secondary | ICD-10-CM | POA: Diagnosis present

## 2020-08-07 NOTE — Therapy (Signed)
[facility] [address] [place], [place], [address] Phone: [phone]   Fax:  [phone]  Occupational Therapy Treatment  Patient Details  Name: Bobby Branch MRN: [id number] Date of Birth: 1960/06/06 Referring Provider (OT): Danella Sensing   Encounter Date: 08/07/2020   OT End of Session - 08/07/20 1426     Visit Number 3    Number of Visits 13    Authorization Type Medicaid - pending auth    OT Start Time 1322    OT Stop Time 1402    OT Time Calculation (min) 40 min    Activity Tolerance Patient tolerated treatment well    Behavior During Therapy [facility] for tasks assessed/performed             Past Medical History:  Diagnosis Date   Anxiety    Depression    Diabetes mellitus without complication ([place])    type 2   Dyspnea    inhaler   GERD (gastroesophageal reflux disease)    HLD (hyperlipidemia)    Hypertension    Stroke ([place]) 09/2019    Past Surgical History:  Procedure Laterality Date   EYE SURGERY Left    IR ANGIO EXTRACRAN SEL COM CAROTID INNOMINATE UNI L MOD SED  04/20/2020   IR ANGIO INTRA EXTRACRAN SEL COM CAROTID INNOMINATE UNI L MOD SED  06/21/2020   IR ANGIO VERTEBRAL SEL SUBCLAVIAN INNOMINATE UNI R MOD SED  06/21/2020   IR CT HEAD LTD  04/20/2020   IR INTRAVSC STENT CERV CAROTID W/EMB-PROT MOD SED INCL ANGIO  06/21/2020   IR PERCUTANEOUS ART THROMBECTOMY/INFUSION INTRACRANIAL INC DIAG ANGIO  04/20/2020   IR RADIOLOGIST EVAL & MGMT  06/05/2020   IR RADIOLOGIST EVAL & MGMT  07/18/2020   IR US GUIDE VASC ACCESS RIGHT  04/20/2020   IR US GUIDE VASC ACCESS RIGHT  06/21/2020   RADIOLOGY WITH ANESTHESIA N/A 04/20/2020   Procedure: IR WITH ANESTHESIA;  Surgeon: Luanne Bras, MD;  Location: [facility];  Service: Radiology;  Laterality: N/A;   RADIOLOGY WITH ANESTHESIA N/A 06/21/2020   Procedure: RADIOLOGY WITH ANESTHESIA  STENTING;  Surgeon: Corrie Mckusick, DO;  Location: [facility];  Service: Anesthesiology;   Laterality: N/A;    There were no vitals filed for this visit.   Subjective Assessment - 08/07/20 1326     Subjective  "I got the swelling down some"    Pertinent History R MCA stroke.  PMH:  CKD, DM, malnutrition, HTN, polysubstance abuse, prior stroke 08/21 who presents with spastic left sided hemiplegia    Currently in Pain? Yes    Pain Score 4     Pain Location Shoulder   and hand   Pain Orientation Left    Pain Descriptors / Indicators Burning;Sore    Pain Type Acute pain    Pain Onset 1 to 4 weeks ago    Pain Frequency Intermittent    Aggravating Factors  movement    Pain Relieving Factors proper positioning              Completed fabrication of L resting hand splint for improved positioning.    Reviewed initial HEP.--pt returned demo with min cueing/verbalized understanding, but reports not doing all exercises consistently. Pt demo improved self PROM with decr pain today with table slides.  Focused on posture for scapular retraction/depression for improved LUE positioning and decr pain.  Sitting with elbows supported on table, performed modified cat/cow positions with min cueing/facilitation, then gentle trunk rotation/body on arm  movements with min cueing.  Emphasized importance of posture for improved LUE positioning and decr pain.       OT Education - 08/07/20 1428     Education Details Splint wear/care and precautions    [name]    Methods Explanation;Demonstration;Handout    Comprehension Verbalized understanding;Returned demonstration              OT Short Term Goals - 07/19/20 1710       OT SHORT TERM GOAL #1   Title Patient will complete an HEP designed to improve range of motion and decrease pain in LUE    Baseline No HEP    Time 4    Period Weeks    Status New    Target Date 09/02/20      OT SHORT TERM GOAL #2   Title Patient will demonstrate sufficient shoulder range of motion to apply deodorant and wash under left arm  with pain no greater than 1-2/10    Baseline Difficulty washing and applying deodorant pain 6+/10    Time 4    Period Weeks    Status New      OT SHORT TERM GOAL #3   Title Patient will demonstrate 5 degrees of active wrist flex or extension following facilitation    Baseline no volitional wrist movement    Time 4    Period Weeks    Status New      OT SHORT TERM GOAL #4   Title Patient will demonstrate 90% composite wrist flexion/extension passively without pain of more than 1-2/10    Baseline Patient unable to tolerate passive stretch to fingers    Time 4    Period Weeks    Status New      OT SHORT TERM GOAL #5   Title Patient will demonstrate 90 degrees of passive shoulder flexion in preparation for forward reaching    Baseline 70*    Time 4    Period Weeks    Status New               OT Long Term Goals - 07/19/20 1714       OT LONG TERM GOAL #1   Title Patient will complete updated HEP designed to improve functional use of LUE    Baseline No HEP    Time 6    Period Weeks    Status New    Target Date 09/17/20      OT LONG TERM GOAL #2   Title Patient will don/doff left UE positioning devices as warranted    Baseline No current splints or slings    Time 6    Period Weeks    Status New      OT LONG TERM GOAL #3   Title Patient will demonstrate ability to cut food on plate    Baseline Unable to cut up food    Time 6    Period Weeks    Status New      OT LONG TERM GOAL #4   Title Patient will demonstrate ability to peel/chop vegetables as needed for soup    Baseline Unable to cut veggies    Time 6    Period Weeks    Status New      OT LONG TERM GOAL #5   Title Patient will demonstrate ability to button/zipper clothing as needed    Baseline Opts to wear clothing without fasteners    Time 6    Period Weeks  Status New                   Plan - 08/07/20 1427     Clinical Impression Statement Pt is progressing towards goals with  improving ROM and decr pain and edema.    OT Occupational Profile and History Detailed Assessment- Review of Records and additional review of physical, cognitive, psychosocial history related to current functional performance    Occupational performance deficits (Please refer to evaluation for details): ADL's;IADL's;Rest and Sleep    Body Structure / Function / Physical Skills ADL;Dexterity;Flexibility;Muscle spasms;ROM;Strength;Vision;Tone;IADL;FMC;Edema;Coordination;Balance;Body mechanics;Decreased knowledge of precautions;Endurance;Improper spinal/pelvic alignment;Pain;Sensation;Decreased knowledge of use of DME;GMC;Mobility;Proprioception    Cognitive Skills Attention;Memory;Perception;Energy/Drive;Problem Solve;Safety Awareness    Rehab Potential Good    Clinical Decision Making Several treatment options, min-mod task modification necessary    Comorbidities Affecting Occupational Performance: May have comorbidities impacting occupational performance    Modification or Assistance to Complete Evaluation  Min-Moderate modification of tasks or assist with assess necessary to complete eval    OT Frequency 2x / week    OT Duration 6 weeks    OT Treatment/Interventions Self-care/ADL training;Therapeutic exercise;DME and/or AE instruction;Functional Mobility Training;Cognitive remediation/compensation;Balance training;Visual/perceptual remediation/compensation;Splinting;Manual Therapy;Neuromuscular education;Fluidtherapy;Electrical Stimulation;Aquatic Barista;Iontophoresis;Passive range of motion;Therapeutic activities;Patient/family education    Plan supine manual therapy and neuro re-ed LUE, ?kinesiotaping for pain/improved positioning L shoulder, education regarding bed positioning LUE.    Consulted and Agree with Plan of Care Patient             Patient will benefit from skilled therapeutic intervention in order to improve the following deficits and impairments:   Body  Structure / Function / Physical Skills: ADL, Dexterity, Flexibility, Muscle spasms, ROM, Strength, Vision, Tone, IADL, FMC, Edema, Coordination, Balance, Body mechanics, Decreased knowledge of precautions, Endurance, Improper spinal/pelvic alignment, Pain, Sensation, Decreased knowledge of use of DME, GMC, Mobility, Proprioception Cognitive Skills: Attention, Memory, Perception, Energy/Drive, Problem Solve, Safety Awareness     Visit Diagnosis: Spastic 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 (HCC)  Chronic left shoulder pain  Stiffness of left shoulder, not elsewhere classified  Stiffness of left hand, not elsewhere classified  Left shoulder pain, unspecified chronicity  Unsteadiness on feet    Problem List Patient Active Problem List   Diagnosis Date Noted   Internal carotid artery stenosis, bilateral 06/21/2020   Slow transit constipation    Spastic hemiplegia affecting nondominant side (HCC)    Stage 3b chronic kidney disease ([place])    Essential hypertension    Controlled type 2 diabetes mellitus with hyperglycemia, without long-term current use of insulin ([place])    Protein-calorie malnutrition, severe 04/25/2020   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 [place], [place], [address] Phone: [phone]   Fax:  [phone]  Name: Bobby Branch MRN: [id number] Date of Birth: 1961-02-02  Vianne Bulls, OTR/L [facility] [address] [place], [place]  [address] [phone] phone [phone] 08/07/20 2:29 PM

## 2020-08-07 NOTE — Patient Instructions (Signed)
   Your Splint This splint should initially be fitted by a healthcare practitioner.  The healthcare practitioner is responsible for providing wearing instructions and precautions to the patient, other healthcare practitioners and care provider involved in the patient's care.  This splint was custom made for you. Please read the following instructions to learn about wearing and caring for your splint.  Precautions Should your splint cause any of the following problems, remove the splint immediately and contact your therapist/physician. Swelling Severe Pain Pressure Areas Stiffness Numbness  Do not wear your splint while operating machinery unless it has been fabricated for that purpose.  When To Wear Your Splint Where your splint according to your therapist/physician instructions. Wear 1 hr, 2-3x/day, can gradually incr wear time.   Care and Cleaning of Your Splint Keep your splint away from open flames. Your splint will lose its shape in temperatures over 135 degrees Farenheit, ( in car windows, near radiators, ovens or in hot water).  Never make any adjustments to your splint, if the splint needs adjusting remove it and make an appointment to see your therapist. Your splint, including the cushion liner may be cleaned with soap and lukewarm water.  Do not immerse in hot water over 135 degrees Farenheit. Straps may be washed with soap and water, but do not moisten the self-adhesive portion. For ink or hard to remove spots use a scouring cleanser which contains chlorine.  Rinse the splint thoroughly after using chlorine cleanser.

## 2020-08-08 ENCOUNTER — Ambulatory Visit: Payer: Federal, State, Local not specified - PPO

## 2020-08-09 ENCOUNTER — Ambulatory Visit: Payer: Federal, State, Local not specified - PPO | Admitting: Occupational Therapy

## 2020-08-10 ENCOUNTER — Ambulatory Visit: Payer: Federal, State, Local not specified - PPO

## 2020-08-10 ENCOUNTER — Other Ambulatory Visit: Payer: Self-pay

## 2020-08-10 DIAGNOSIS — I69354 Hemiplegia and hemiparesis following cerebral infarction affecting left non-dominant side: Secondary | ICD-10-CM | POA: Diagnosis not present

## 2020-08-10 DIAGNOSIS — I69351 Hemiplegia and hemiparesis following cerebral infarction affecting right dominant side: Secondary | ICD-10-CM

## 2020-08-10 DIAGNOSIS — R2681 Unsteadiness on feet: Secondary | ICD-10-CM

## 2020-08-10 NOTE — Therapy (Signed)
[facility] [address] [place], [place], [address] Phone: [phone]   Fax:  [phone]  Physical Therapy Treatment  Patient Details  Name: Bobby Branch MRN: [id number] Date of Birth: Apr 18, 1960 Referring Provider (PT): Danella Sensing NP   Encounter Date: 08/10/2020   PT End of Session - 08/10/20 1238     Visit Number 5    Number of Visits 17    Date for PT Re-Evaluation 09/13/20    Authorization Type [employer] Medicaid & [employer]    Authorization Time Period [employer] approved 12 visits then auth required for 13th visits and beyond    Authorization - Visit Number 12    Progress Note Due on Visit 10    PT Start Time 1230    PT Stop Time 1315    PT Time Calculation (min) 45 min    Equipment Utilized During Treatment Gait belt    Activity Tolerance Patient tolerated treatment well    Behavior During Therapy WFL for tasks assessed/performed             Past Medical History:  Diagnosis Date   Anxiety    Depression    Diabetes mellitus without complication ([place])    type 2   Dyspnea    inhaler   GERD (gastroesophageal reflux disease)    HLD (hyperlipidemia)    Hypertension    Stroke ([place]) 09/2019    Past Surgical History:  Procedure Laterality Date   EYE SURGERY Left    IR ANGIO EXTRACRAN SEL COM CAROTID INNOMINATE UNI L MOD SED  04/20/2020   IR ANGIO INTRA EXTRACRAN SEL COM CAROTID INNOMINATE UNI L MOD SED  06/21/2020   IR ANGIO VERTEBRAL SEL SUBCLAVIAN INNOMINATE UNI R MOD SED  06/21/2020   IR CT HEAD LTD  04/20/2020   IR INTRAVSC STENT CERV CAROTID W/EMB-PROT MOD SED INCL ANGIO  06/21/2020   IR PERCUTANEOUS ART THROMBECTOMY/INFUSION INTRACRANIAL INC DIAG ANGIO  04/20/2020   IR RADIOLOGIST EVAL & MGMT  06/05/2020   IR RADIOLOGIST EVAL & MGMT  07/18/2020   IR US GUIDE VASC ACCESS RIGHT  04/20/2020   IR US GUIDE VASC ACCESS RIGHT  06/21/2020   RADIOLOGY WITH ANESTHESIA N/A 04/20/2020   Procedure: IR WITH  ANESTHESIA;  Surgeon: Luanne Bras, MD;  Location: [facility];  Service: Radiology;  Laterality: N/A;   RADIOLOGY WITH ANESTHESIA N/A 06/21/2020   Procedure: RADIOLOGY WITH ANESTHESIA  STENTING;  Surgeon: Corrie Mckusick, DO;  Location: [facility];  Service: Anesthesiology;  Laterality: N/A;    There were no vitals filed for this visit.   Subjective Assessment - 08/10/20 1237     Subjective No changes to note, has not brought walker today as he left it in his truck    Pertinent History 60 y.o. 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 s/p emergent cerebral angiogram with  successful thrombectomy with small volume SAH sylvian sulci; cocaine +    Limitations Standing    How long can you sit comfortably? unlimited    How long can you stand comfortably? <5 min    How long can you walk comfortably? <10 min    Patient Stated Goals To be able to walk again by myself    Pain Onset 1 to 4 weeks ago  [facility] Adult PT Treatment/Exercise - 08/10/20 0001       Transfers   Transfers Sit to Stand;Stand to Sit    Sit to Stand 4: Min guard    Number of Reps Other reps (comment)   5 reps   Comments with L AFO      Ambulation/Gait   Ambulation/Gait Yes    Ambulation/Gait Assistance 4: Min guard    Ambulation Distance (Feet) 230 Feet    Assistive device Straight cane    Gait Pattern Step-through pattern;Decreased stride length    Gait Comments L AFO with hemiparetic gait pattern      Knee/Hip Exercises: Stretches   Other Knee/Hip Stretches seated L hamstring stretch 2 min hold      Knee/Hip Exercises: Standing   Terminal Knee Extension Strengthening;Left;1 set;15 reps;Theraband    Theraband Level (Terminal Knee Extension) Level 2 (Red)    Terminal Knee Extension Limitations 3x15 with varied  angle of pull      Knee/Hip Exercises: Seated   Long Arc Quad Both;15 reps;1 set;Weights;Limitations    Long Arc Quad Weight 2 lbs.    Long Arc Quad Limitations alternating with lat press RUE    Marching Both;15 reps;1 set;Limitations;Weights    Marching Limitations alternating with lat press RUE                 Balance Exercises - 08/10/20 0001       Balance Exercises: Standing   Stepping Strategy Anterior;UE support;Limitations;10 reps    Stepping Strategy Limitations RUE support onto 2" block                 PT Short Term Goals - 08/10/20 1334       PT SHORT TERM GOAL #1   Title Patient to demo I in initial HEP    Baseline TBD; 07/19/20 Initial HEP issued today    Time 4    Period Weeks    Status On-going    Target Date 09/13/20      PT SHORT TERM GOAL #2   Title Patient to transfer sit/stand under S only    Baseline SBA to perform sit/stand transfer.    Time 4    Period Weeks    Status On-going    Target Date 08/16/20      PT SHORT TERM GOAL #3   Title Patient to ambulate 288f with hemiwalker and S    Baseline 1154fwith hemiwalker and CGA; 08/10/20 CGA with L AFO 2305fsing SPC    Time 4    Period Weeks    Status Achieved    Target Date 08/16/20      PT SHORT TERM GOAL #4   Title Assess 5x STS time and set appropriate goal    Baseline TBD; 07/23/20 5x STS 26.8s    Time 4    Period Weeks    Status Achieved    Target Date 08/16/20      PT SHORT TERM GOAL #5   Title Assess FGA/DGI and set appropriate goal    Baseline TBD; 07/23/20 DGI score 16, goal is 20    Time 4    Period Weeks    Status Achieved    Target Date 08/16/20               PT Long Term Goals - 07/12/20 1643       PT LONG TERM GOAL #1   Title Patient to ambulate 500f27fth LRAD under S    Baseline  139f with hemiwalker and CGA    Time 8    Period Weeks    Status New    Target Date 09/13/20      PT LONG TERM GOAL #2   Title Patient to increase gait velocity to  0.4 m/s across 1107m  Baseline Patient demos gait velocity of 0.26 m/s across 1090m Time 8    Period Weeks    Status New    Target Date 09/13/20      PT LONG TERM GOAL #3   Title patient to demo 1/2 grade improvement in LLE strength throughout    Baseline LLE strength ranges fro 2-3+/5 throughout    Time 8    Period Weeks    Status New    Target Date 09/13/20      PT LONG TERM GOAL #4   Title Assess 5x STS progress    Baseline TBD    Time 8    Period Weeks    Status New    Target Date 09/13/20      PT LONG TERM GOAL #5   Title Assess FGA/DGI progress    Baseline TBD    Time 8    Period Weeks    Status New    Target Date 09/13/20      Additional Long Term Goals   Additional Long Term Goals Yes      PT LONG TERM GOAL #6   Title Improve FOTO score to 69    Baseline Initial FOTO score 55    Time 8    Period Weeks    Status New    Target Date 09/14/20                   Plan - 08/10/20 1328     Clinical Impression Statement Todays sssion focused on LLE strength and ROM to knee specifically, session was performed in L AFO to assess benefit.  Tasks included ambulation with SPC, transfers, stepping strategies and strengthening combined with core activities.  With AFO, patient able to demonstarte improved TKE, better postural control and a decreased fall risk due to less L foot scuff.  Continues to need reminders on tarnsfer form, ie.  scooting to edge and not relying on cane for support to stand.    Personal Factors and Comorbidities Comorbidity 2    Comorbidities CVA, DM    Examination-Activity Limitations Locomotion Level;Transfers    Stability/Clinical Decision Making Stable/Uncomplicated    Rehab Potential Good    PT Frequency 2x / week    PT Duration 8 weeks    PT Treatment/Interventions ADLs/Self Care Home Management;DME Instruction;Gait training;Stair training;Functional mobility training;Therapeutic activities;Therapeutic exercise;Balance  training;Neuromuscular re-education;Patient/family education    PT Next Visit Plan standing balance tasks, TKEs, ambulation with AFO to facilitate knee extension, LLE strength and stretch tasks    PT Home Exercise Plan [email or web address]    Consulted and Agree with Plan of Care Patient             Patient will benefit from skilled therapeutic intervention in order to improve the following deficits and impairments:  Abnormal gait, Decreased coordination, Difficulty walking, Decreased endurance, Decreased activity tolerance, Decreased balance, Decreased mobility, Decreased strength  Visit Diagnosis: Spastic 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 (HCC)  Unsteadiness on feet  Hemiplegia and hemiparesis following cerebral infarction affecting right dominant [facility]   Problem List Patient Active Problem List   Diagnosis Date Noted  Internal carotid artery stenosis, bilateral 06/21/2020   Slow transit constipation    Spastic hemiplegia affecting nondominant side (HCC)    Stage 3b chronic kidney disease ([place])    Essential hypertension    Controlled type 2 diabetes mellitus with hyperglycemia, without long-term current use of insulin ([place])    Protein-calorie malnutrition, severe 04/25/2020   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 [place], [place], [address] Phone: [phone]   Fax:  [phone]  Name: Boyce Rehmer MRN: [id number] Date of Birth: 01/08/1961

## 2020-08-13 ENCOUNTER — Ambulatory Visit: Payer: Federal, State, Local not specified - PPO | Admitting: Occupational Therapy

## 2020-08-13 ENCOUNTER — Ambulatory Visit: Payer: Federal, State, Local not specified - PPO

## 2020-08-14 ENCOUNTER — Other Ambulatory Visit: Payer: Self-pay

## 2020-08-14 ENCOUNTER — Encounter
Payer: Federal, State, Local not specified - PPO | Attending: Registered Nurse | Admitting: Physical Medicine & Rehabilitation

## 2020-08-14 ENCOUNTER — Encounter: Payer: Self-pay | Admitting: Physical Medicine & Rehabilitation

## 2020-08-14 VITALS — BP 100/62 | HR 82 | Temp 99.1°F | Ht 66.0 in | Wt 130.4 lb

## 2020-08-14 DIAGNOSIS — G8110 Spastic hemiplegia affecting unspecified side: Secondary | ICD-10-CM

## 2020-08-14 DIAGNOSIS — M7502 Adhesive capsulitis of left shoulder: Secondary | ICD-10-CM

## 2020-08-14 NOTE — Progress Notes (Signed)
Subjective:    Patient ID: Bobby Branch, male    DOB: [birth year], 60 y.o.   MRN: [id number]    A36 y.o. right-handed male with history of CVA August 2021 with very mild residual deficits maintained on aspirin and Plavix, diabetes mellitus, CKD stage III, hypertension and hyperlipidemia.  Per chart review lives with girlfriend and modified independent.  Presented 04/20/2020 with acute onset of left-sided weakness.  Cranial CT scan showed asymmetric hyperdensity involving the right M2/M3 branches concerning for thrombus.  Few remote lacunar infarcts about the bilateral basal ganglia.  CT angiogram of head and neck as well as CT cerebral perfusion scan showed acute 15 cc core infarct involving the posterior right MCA distribution with a 67 cc surrounding ischemic penumbra.  Patient underwent revascularization per interventional radiology.  MRI follow-up showed right MCA patchy small acute infarct with possible petechial hemorrhage.  Carotid Dopplers with right 60 to 79% left ICA and 80 to 99%.  Echocardiogram with ejection fraction of 55 to 60% no wall motion abnormalities grade 1 diastolic dysfunction.  Admission chemistries glucose 231 creatinine 2.10 hemoglobin 13.6 hemoglobin A1c 7.3 urine drug screen positive cocaine as well as marijuana.  Initially maintained on Cleviprex for blood pressure control.  Neurology follow-up maintained on aspirin 325 mg daily and Plavix 75 mg daily x3 months.  In regards to patient's bilateral carotid stenosis Dr. Earleen Newport of interventional radiology to follow-up outpatient for potential revascularization treatment.  He was tolerating a regular consistency diet.  Therapy evaluations completed due to patient's left-sided weakness recommendations of physical medicine rehab consult and patient was admitted for a comprehensive rehab program.Admit date: 04/23/2020 Discharge date: 05/22/2020 HPI  Chief complaint left shoulder pain Patient denies injuring the shoulder in a fall  although he did fall a couple weeks ago did not fall on his shoulder. The patient also has some left-sided neck pain.  No numbness or tingling that is new in the left arm.  The patient does have some swelling in his left hand.  No pain in the left hand. Patient also notes some tightness in his muscles in the left arm Patient's noted on outpatient therapy 07/12/2020 and outpatient OT 07/19/2020  Right ICA stent placed 06/21/2020 Had a virtual visit with his PCP Freeman Caldron on 07/04/2020 Pain Inventory Average Pain 10 Pain Right Now 6 My pain is sharp, burning, stabbing, aching, and throbbing  In the last 24 hours, has pain interfered with the following? General activity 6 Relation with others 6 Enjoyment of life 6 What TIME of day is your pain at its worst? varies Sleep (in general) Poor  Pain is worse with:  laying down Pain improves with: heat/ice, therapy/exercise, and medication Relief from Meds: 3  No family history on file. Social History   Socioeconomic History   Marital status: Single    Spouse name: Not on file   Number of children: Not on file   Years of education: Not on file   Highest education level: Not on file  Occupational History   Not on file  Tobacco Use   Smoking status: Every Day    Pack years: 0.00    Types: Cigarettes   Smokeless tobacco: Never   Tobacco comments:    patient trying to quit  Vaping Use   Vaping Use: Never used  Substance and Sexual Activity   Alcohol use: Yes    Comment: beer   Drug use: Yes    Types: Cocaine, Marijuana    Comment: Last use  in 04/2020   Sexual activity: Yes    Partners: Female  Other Topics Concern   Not on file  Social History Narrative   Not on file   Social Determinants of Health   Financial Resource Strain: Not on file  Food Insecurity: Not on file  Transportation Needs: Not on file  Physical Activity: Not on file  Stress: Not on file  Social Connections: Not on file   Past Surgical History:   Procedure Laterality Date   EYE SURGERY Left    IR ANGIO EXTRACRAN SEL COM CAROTID INNOMINATE UNI L MOD SED  04/20/2020   IR ANGIO INTRA EXTRACRAN SEL COM CAROTID INNOMINATE UNI L MOD SED  06/21/2020   IR ANGIO VERTEBRAL SEL SUBCLAVIAN INNOMINATE UNI R MOD SED  06/21/2020   IR CT HEAD LTD  04/20/2020   IR INTRAVSC STENT CERV CAROTID W/EMB-PROT MOD SED INCL ANGIO  06/21/2020   IR PERCUTANEOUS ART THROMBECTOMY/INFUSION INTRACRANIAL INC DIAG ANGIO  04/20/2020   IR RADIOLOGIST EVAL & MGMT  06/05/2020   IR RADIOLOGIST EVAL & MGMT  07/18/2020   IR US GUIDE VASC ACCESS RIGHT  04/20/2020   IR US GUIDE VASC ACCESS RIGHT  06/21/2020   RADIOLOGY WITH ANESTHESIA N/A 04/20/2020   Procedure: IR WITH ANESTHESIA;  Surgeon: Luanne Bras, MD;  Location: [facility];  Service: Radiology;  Laterality: N/A;   RADIOLOGY WITH ANESTHESIA N/A 06/21/2020   Procedure: RADIOLOGY WITH ANESTHESIA  STENTING;  Surgeon: Corrie Mckusick, DO;  Location: [facility];  Service: Anesthesiology;  Laterality: N/A;   Past Surgical History:  Procedure Laterality Date   EYE SURGERY Left    IR ANGIO EXTRACRAN SEL COM CAROTID INNOMINATE UNI L MOD SED  04/20/2020   IR ANGIO INTRA EXTRACRAN SEL COM CAROTID INNOMINATE UNI L MOD SED  06/21/2020   IR ANGIO VERTEBRAL SEL SUBCLAVIAN INNOMINATE UNI R MOD SED  06/21/2020   IR CT HEAD LTD  04/20/2020   IR INTRAVSC STENT CERV CAROTID W/EMB-PROT MOD SED INCL ANGIO  06/21/2020   IR PERCUTANEOUS ART THROMBECTOMY/INFUSION INTRACRANIAL INC DIAG ANGIO  04/20/2020   IR RADIOLOGIST EVAL & MGMT  06/05/2020   IR RADIOLOGIST EVAL & MGMT  07/18/2020   IR US GUIDE VASC ACCESS RIGHT  04/20/2020   IR US GUIDE VASC ACCESS RIGHT  06/21/2020   RADIOLOGY WITH ANESTHESIA N/A 04/20/2020   Procedure: IR WITH ANESTHESIA;  Surgeon: Luanne Bras, MD;  Location: [facility];  Service: Radiology;  Laterality: N/A;   RADIOLOGY WITH ANESTHESIA N/A 06/21/2020   Procedure: RADIOLOGY WITH ANESTHESIA  STENTING;  Surgeon: Corrie Mckusick, DO;  Location:  [facility];  Service: Anesthesiology;  Laterality: N/A;   Past Medical History:  Diagnosis Date   Anxiety    Depression    Diabetes mellitus without complication (HCC)    type 2   Dyspnea    inhaler   GERD (gastroesophageal reflux disease)    HLD (hyperlipidemia)    Hypertension    Stroke ([place]) 09/2019   BP 100/62 (BP Location: Right Arm, Patient Position: Sitting, Cuff Size: Normal)   Pulse 82   Temp 99.1 F (37.3 C) (Oral)   Ht '5\' 6"'$  (1.676 m)   Wt 130 lb 6.4 oz (59.1 kg)   SpO2 96%   BMI 21.05 kg/m   Opioid Risk Score:   Fall Risk Score:  `1  Depression screen PHQ 2/9  Depression screen PHQ 2/9 07/03/2020  Decreased Interest 0  Down, Depressed, Hopeless 1  PHQ - 2 Score 1  Altered sleeping 3  Tired, decreased energy 1  Change in appetite 0  Feeling bad or failure about yourself  1  Trouble concentrating 0  Moving slowly or fidgety/restless 1  Suicidal thoughts 0  PHQ-9 Score 7     Review of Systems  Musculoskeletal:        Right arm pain Right shoulder pain  Neurological:  Positive for weakness.  All other systems reviewed and are negative.     Objective:   Physical Exam Vitals and nursing note reviewed.  Constitutional:      Appearance: He is normal weight.  HENT:     Head: Normocephalic and atraumatic.  Eyes:     Extraocular Movements: Extraocular movements intact.     Conjunctiva/sclera: Conjunctivae normal.     Pupils: Pupils are equal, round, and reactive to light.  Musculoskeletal:     Comments: No pain with hand range of motion minimal pain with MCP compression no pain with elbow range of motion Left shoulder has pain with external rotation as well as abduction as well as adduction. There is no shoulder deformity no tenderness along the [facility] joint or in the subacromial area.  Skin:    General: Skin is warm and dry.     Comments: No vasomotor changes in the left upper extremity. Mild dorsal hand edema  Neurological:     Mental Status: He is  alert and oriented to person, place, and time.  Psychiatric:        Mood and Affect: Mood normal.        Behavior: Behavior normal.   Tone MAS 3 in the left elbow flexor MAS 3 at the left wrist flexor MAS 2 in the left finger flexors There is increased tone in the left trapezius Left upper extremity 3 - deltoid and bicep trace tricep trace finger flexors 0 finger extensors 4/5 in the left hip flexor knee extensor 3 - ankle dorsiflexor     Assessment & Plan:  1.  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  Adhesive capsulitis left shoulder, persisting despite inpatient and outpatient therapy Shoulder injection left glenohumeral Without ultrasound guidance)  Indication: Left shoulder pain not relieved by medication management and other conservative care.  Informed consent was obtained after describing risks and benefits of the procedure with the patient, this includes bleeding, bruising, infection and medication side effects. The patient wishes to proceed and has given written consent. Patient was placed in a seated position. The left shoulder was marked and prepped with betadine in the subacromial area. A 25-gauge 1-1/2 inch needle was inserted into the subacromial area. After negative draw back for blood, a solution containing 1 mL of 6 mg per ML betamethasone and 4 mL of 1% lidocaine was injected. A band aid was applied. The patient tolerated the procedure well. Post procedure instructions were given.

## 2020-08-14 NOTE — Patient Instructions (Signed)
Shoulder injection with cortisone  Plan botox injection to spastic muscles in 2 wks

## 2020-08-15 ENCOUNTER — Encounter: Payer: Self-pay | Admitting: Occupational Therapy

## 2020-08-15 ENCOUNTER — Ambulatory Visit: Payer: Federal, State, Local not specified - PPO | Admitting: Occupational Therapy

## 2020-08-15 DIAGNOSIS — G8929 Other chronic pain: Secondary | ICD-10-CM

## 2020-08-15 DIAGNOSIS — I69354 Hemiplegia and hemiparesis following cerebral infarction affecting left non-dominant side: Secondary | ICD-10-CM

## 2020-08-15 DIAGNOSIS — M25612 Stiffness of left shoulder, not elsewhere classified: Secondary | ICD-10-CM

## 2020-08-15 DIAGNOSIS — M25512 Pain in left shoulder: Secondary | ICD-10-CM

## 2020-08-15 DIAGNOSIS — R41840 Attention and concentration deficit: Secondary | ICD-10-CM

## 2020-08-15 DIAGNOSIS — R2681 Unsteadiness on feet: Secondary | ICD-10-CM

## 2020-08-15 DIAGNOSIS — M25642 Stiffness of left hand, not elsewhere classified: Secondary | ICD-10-CM

## 2020-08-15 NOTE — Therapy (Signed)
[facility] [address] [place], [place], [address] Phone: [phone]   Fax:  [phone]  Occupational Therapy Treatment  Patient Details  Name: Bobby Branch MRN: [id number] Date of Birth: 1960/12/06 Referring Provider (OT): Danella Sensing   Encounter Date: 08/15/2020   OT End of Session - 08/15/20 1530     Visit Number 4    Number of Visits 13    Authorization Type Medicaid - 12 visits approved    OT Start Time [id number]    OT Stop Time 1400    OT Time Calculation (min) 45 min    Activity Tolerance Patient tolerated treatment well    Behavior During Therapy [facility] for tasks assessed/performed             Past Medical History:  Diagnosis Date   Anxiety    Depression    Diabetes mellitus without complication ([place])    type 2   Dyspnea    inhaler   GERD (gastroesophageal reflux disease)    HLD (hyperlipidemia)    Hypertension    Stroke ([facility] [facility]) 09/2019    Past Surgical History:  Procedure Laterality Date   EYE SURGERY Left    IR ANGIO EXTRACRAN SEL COM CAROTID INNOMINATE UNI L MOD SED  04/20/2020   IR ANGIO INTRA EXTRACRAN SEL COM CAROTID INNOMINATE UNI L MOD SED  06/21/2020   IR ANGIO VERTEBRAL SEL SUBCLAVIAN INNOMINATE UNI R MOD SED  06/21/2020   IR CT HEAD LTD  04/20/2020   IR INTRAVSC STENT CERV CAROTID W/EMB-PROT MOD SED INCL ANGIO  06/21/2020   IR PERCUTANEOUS ART THROMBECTOMY/INFUSION INTRACRANIAL INC DIAG ANGIO  04/20/2020   IR RADIOLOGIST EVAL & MGMT  06/05/2020   IR RADIOLOGIST EVAL & MGMT  07/18/2020   IR US GUIDE VASC ACCESS RIGHT  04/20/2020   IR US GUIDE VASC ACCESS RIGHT  06/21/2020   RADIOLOGY WITH ANESTHESIA N/A 04/20/2020   Procedure: IR WITH ANESTHESIA;  Surgeon: Luanne Bras, MD;  Location: [facility];  Service: Radiology;  Laterality: N/A;   RADIOLOGY WITH ANESTHESIA N/A 06/21/2020   Procedure: RADIOLOGY WITH ANESTHESIA  STENTING;  Surgeon: Corrie Mckusick, DO;  Location: [facility];  Service:  Anesthesiology;  Laterality: N/A;    There were no vitals filed for this visit.   Subjective Assessment - 08/15/20 1509     Subjective  Patient tearful when talking about his caregivers - frustrated with their care of him overall.    Pertinent History R MCA stroke.  PMH:  CKD, DM, malnutrition, HTN, polysubstance abuse, prior stroke 08/21 who presents with spastic left sided hemiplegia    Currently in Pain? Yes    Pain Score 4     Pain Location Shoulder    Pain Orientation Left    Pain Descriptors / Indicators Aching    Pain Type Chronic pain    Pain Onset More than a month ago    Pain Frequency Intermittent    Aggravating Factors  movement, end range    Pain Relieving Factors alignment                          OT Treatments/Exercises (OP) - 08/15/20 0001       ADLs   ADL Comments Patient tearful today when talking about his frustration with his caregivers.  Will reach out to social services to seek support for him.      Neurological Re-education Exercises   Other Exercises 1 Patient recently  received injection for left shoulder to address frozen shoulder.  Patient wearing resting hand splint and edema glove to help manage swelling and positioning/ tightness in left hand.  Patient with pain with gentle range toward external rotation in supine.  Working to roll toward left side and isolating elbow, forearm, and wrist active movement.,  Working on active relaxation of elbow flexors to allow elbow extension with gravity.  Patient with relief of symptoms with alignment of scapula on thorax, and slight traction of humerus with gentle range of motion.                      OT Short Term Goals - 08/15/20 1531       OT SHORT TERM GOAL #1   Title Patient will complete an HEP designed to improve range of motion and decrease pain in LUE    Baseline No HEP    Time 4    Period Weeks    Status New    Target Date 09/02/20      OT SHORT TERM GOAL #2   Title  Patient will demonstrate sufficient shoulder range of motion to apply deodorant and wash under left arm with pain no greater than 1-2/10    Baseline Difficulty washing and applying deodorant pain 6+/10    Time 4    Period Weeks    Status New      OT SHORT TERM GOAL #3   Title Patient will demonstrate 5 degrees of active wrist flex or extension following facilitation    Baseline no volitional wrist movement    Time 4    Period Weeks    Status New      OT SHORT TERM GOAL #4   Title Patient will demonstrate 90% composite wrist flexion/extension passively without pain of more than 1-2/10    Baseline Patient unable to tolerate passive stretch to fingers    Time 4    Period Weeks    Status New      OT SHORT TERM GOAL #5   Title Patient will demonstrate 90 degrees of passive shoulder flexion in preparation for forward reaching    Baseline 70*    Time 4    Period Weeks    Status New               OT Long Term Goals - 08/15/20 1531       OT LONG TERM GOAL #1   Title Patient will complete updated HEP designed to improve functional use of LUE    Baseline No HEP    Time 6    Period Weeks    Status New      OT LONG TERM GOAL #2   Title Patient will don/doff left UE positioning devices as warranted    Baseline No current splints or slings    Time 6    Period Weeks    Status New      OT LONG TERM GOAL #3   Title Patient will demonstrate ability to cut food on plate    Baseline Unable to cut up food    Time 6    Period Weeks    Status New      OT LONG TERM GOAL #4   Title Patient will demonstrate ability to peel/chop vegetables as needed for soup    Baseline Unable to cut veggies    Time 6    Period Weeks    Status New  OT LONG TERM GOAL #5   Title Patient will demonstrate ability to button/zipper clothing as needed    Baseline Opts to wear clothing without fasteners    Time 6    Period Weeks    Status New                   Plan - 08/15/20 1531      Clinical Impression Statement Pt is progressing towards goals with improving ROM and decr pain and edema.    OT Occupational Profile and History Detailed Assessment- Review of Records and additional review of physical, cognitive, psychosocial history related to current functional performance    Occupational performance deficits (Please refer to evaluation for details): ADL's;IADL's;Rest and Sleep    Body Structure / Function / Physical Skills ADL;Dexterity;Flexibility;Muscle spasms;ROM;Strength;Vision;Tone;IADL;FMC;Edema;Coordination;Balance;Body mechanics;Decreased knowledge of precautions;Endurance;Improper spinal/pelvic alignment;Pain;Sensation;Decreased knowledge of use of DME;GMC;Mobility;Proprioception    Cognitive Skills Attention;Memory;Perception;Energy/Drive;Problem Solve;Safety Awareness    Rehab Potential Good    Clinical Decision Making Several treatment options, min-mod task modification necessary    Comorbidities Affecting Occupational Performance: May have comorbidities impacting occupational performance    Modification or Assistance to Complete Evaluation  Min-Moderate modification of tasks or assist with assess necessary to complete eval    OT Frequency 2x / week    OT Duration 6 weeks    OT Treatment/Interventions Self-care/ADL training;Therapeutic exercise;DME and/or AE instruction;Functional Mobility Training;Cognitive remediation/compensation;Balance training;Visual/perceptual remediation/compensation;Splinting;Manual Therapy;Neuromuscular education;Fluidtherapy;Electrical Stimulation;Aquatic Barista;Iontophoresis;Passive range of motion;Therapeutic activities;Patient/family education    Plan supine manual therapy and neuro re-ed LUE, ?kinesiotaping for pain/improved positioning L shoulder, education regarding bed positioning LUE.    Consulted and Agree with Plan of Care Patient             Patient will benefit from skilled therapeutic  intervention in order to improve the following deficits and impairments:   Body Structure / Function / Physical Skills: ADL, Dexterity, Flexibility, Muscle spasms, ROM, Strength, Vision, Tone, IADL, FMC, Edema, Coordination, Balance, Body mechanics, Decreased knowledge of precautions, Endurance, Improper spinal/pelvic alignment, Pain, Sensation, Decreased knowledge of use of DME, GMC, Mobility, Proprioception Cognitive Skills: Attention, Memory, Perception, Energy/Drive, Problem Solve, Safety Awareness     Visit Diagnosis: Spastic 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 (HCC)  Unsteadiness on feet  Chronic left shoulder pain  Stiffness of left shoulder, not elsewhere classified  Stiffness of left hand, not elsewhere classified  Left shoulder pain, unspecified chronicity  Attention and concentration deficit    Problem List Patient Active Problem List   Diagnosis Date Noted   Internal carotid artery stenosis, bilateral 06/21/2020   Slow transit constipation    Spastic hemiplegia affecting nondominant side (HCC)    Stage 3b chronic kidney disease ([place])    Essential hypertension    Controlled type 2 diabetes mellitus with hyperglycemia, without long-term current use of insulin ([place])    Protein-calorie malnutrition, severe 04/25/2020   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

## 2020-08-16 ENCOUNTER — Ambulatory Visit: Payer: Federal, State, Local not specified - PPO

## 2020-08-16 ENCOUNTER — Other Ambulatory Visit: Payer: Self-pay

## 2020-08-16 DIAGNOSIS — I69354 Hemiplegia and hemiparesis following cerebral infarction affecting left non-dominant side: Secondary | ICD-10-CM

## 2020-08-16 DIAGNOSIS — R2681 Unsteadiness on feet: Secondary | ICD-10-CM

## 2020-08-16 NOTE — Therapy (Signed)
[facility] [address] [place], [place], [address] Phone: [phone]   Fax:  [phone]  Physical Therapy Treatment  Patient Details  Name: Bobby Branch MRN: [id number] Date of Birth: 01-08-1961 Referring Provider (PT): Danella Sensing NP   Encounter Date: 08/16/2020   PT End of Session - 08/16/20 1344     Visit Number 6    Number of Visits 17    Date for PT Re-Evaluation 09/13/20    Authorization Type [employer] & [employer]    Authorization Time Period [employer] approved 12 visits then auth required for 13th visits and beyond    Authorization - Visit Number 12    Progress Note Due on Visit 10    PT Start Time 1315    PT Stop Time 1400    PT Time Calculation (min) 45 min    Equipment Utilized During Treatment Gait belt    Activity Tolerance Patient tolerated treatment well    Behavior During Therapy WFL for tasks assessed/performed             Past Medical History:  Diagnosis Date   Anxiety    Depression    Diabetes mellitus without complication ([place])    type 2   Dyspnea    inhaler   GERD (gastroesophageal reflux disease)    HLD (hyperlipidemia)    Hypertension    Stroke ([place]) 09/2019    Past Surgical History:  Procedure Laterality Date   EYE SURGERY Left    IR ANGIO EXTRACRAN SEL COM CAROTID INNOMINATE UNI L MOD SED  04/20/2020   IR ANGIO INTRA EXTRACRAN SEL COM CAROTID INNOMINATE UNI L MOD SED  06/21/2020   IR ANGIO VERTEBRAL SEL SUBCLAVIAN INNOMINATE UNI R MOD SED  06/21/2020   IR CT HEAD LTD  04/20/2020   IR INTRAVSC STENT CERV CAROTID W/EMB-PROT MOD SED INCL ANGIO  06/21/2020   IR PERCUTANEOUS ART THROMBECTOMY/INFUSION INTRACRANIAL INC DIAG ANGIO  04/20/2020   IR RADIOLOGIST EVAL & MGMT  06/05/2020   IR RADIOLOGIST EVAL & MGMT  07/18/2020   IR US GUIDE VASC ACCESS RIGHT  04/20/2020   IR US GUIDE VASC ACCESS RIGHT  06/21/2020   RADIOLOGY WITH ANESTHESIA N/A 04/20/2020   Procedure: IR WITH  ANESTHESIA;  Surgeon: Luanne Bras, MD;  Location: [facility];  Service: Radiology;  Laterality: N/A;   RADIOLOGY WITH ANESTHESIA N/A 06/21/2020   Procedure: RADIOLOGY WITH ANESTHESIA  STENTING;  Surgeon: Corrie Mckusick, DO;  Location: [facility];  Service: Anesthesiology;  Laterality: N/A;    There were no vitals filed for this visit.   Subjective Assessment - 08/16/20 1319     Subjective No changes, had L shoulder injected which has helped a bit.  Does not have reliable help at home and has not eaten since yesterday, has lunch with him today    Pertinent History 60 y.o. 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 s/p emergent cerebral angiogram with  successful thrombectomy with small volume SAH sylvian sulci; cocaine +    Limitations Standing    How long can you sit comfortably? unlimited    How long can you stand comfortably? <5 min    How long can you walk comfortably? <10 min    Patient Stated Goals To be able to walk again by myself    Pain Onset  More than a month ago                               [facility] Adult PT Treatment/Exercise - 08/16/20 0001       Transfers   Transfers Sit to Stand    Sit to Stand 7: Independent    Number of Reps 10 reps    Comments with L AFO      Ambulation/Gait   Ambulation/Gait Yes    Ambulation/Gait Assistance 4: Min guard    Ambulation Distance (Feet) 115 Feet    Assistive device None    Gait Pattern Step-through pattern;Decreased stride length    Stairs Yes    Stairs Assistance 4: Min guard    Stair Management Technique One rail Right;Step to pattern    Gait Comments L AFO with hemiparetic gait pattern      Knee/Hip Exercises: Stretches   Other Knee/Hip Stretches seated L hamstring stretch 2 min hold      Knee/Hip Exercises: Standing   Terminal Knee Extension  Strengthening;Left;1 set;15 reps;Theraband    Theraband Level (Terminal Knee Extension) Level 3 (Green)    Terminal Knee Extension Limitations 3x15 with varied angle of pull                 Balance Exercises - 08/16/20 0001       Balance Exercises: Standing   Stepping Strategy Anterior;UE support;Limitations;10 reps;Lateral    Stepping Strategy Limitations RUE support onto 4" block, 10x ea.                 PT Short Term Goals - 08/10/20 1334       PT SHORT TERM GOAL #1   Title Patient to demo I in initial HEP    Baseline TBD; 07/19/20 Initial HEP issued today    Time 4    Period Weeks    Status On-going    Target Date 09/13/20      PT SHORT TERM GOAL #2   Title Patient to transfer sit/stand under S only    Baseline SBA to perform sit/stand transfer.    Time 4    Period Weeks    Status On-going    Target Date 08/16/20      PT SHORT TERM GOAL #3   Title Patient to ambulate 244f with hemiwalker and S    Baseline 1158fwith hemiwalker and CGA; 08/10/20 CGA with L AFO [id number] SPC    Time 4    Period Weeks    Status Achieved    Target Date 08/16/20      PT SHORT TERM GOAL #4   Title Assess 5x STS time and set appropriate goal    Baseline TBD; 07/23/20 5x STS 26.8s    Time 4    Period Weeks    Status Achieved    Target Date 08/16/20      PT SHORT TERM GOAL #5   Title Assess FGA/DGI and set appropriate goal    Baseline TBD; 07/23/20 DGI score 16, goal is 20    Time 4    Period Weeks    Status Achieved    Target Date 08/16/20               PT Long Term Goals - 07/12/20 1643       PT LONG TERM GOAL #1   Title Patient to ambulate 500f54fth LRAD under S    Baseline 115ft15f  with hemiwalker and CGA    Time 8    Period Weeks    Status New    Target Date 09/13/20      PT LONG TERM GOAL #2   Title Patient to increase gait velocity to 0.4 m/s across 64m   Baseline Patient demos gait velocity of 0.26 m/s across 158m  Time 8    Period Weeks     Status New    Target Date 09/13/20      PT LONG TERM GOAL #3   Title patient to demo 1/2 grade improvement in LLE strength throughout    Baseline LLE strength ranges fro 2-3+/5 throughout    Time 8    Period Weeks    Status New    Target Date 09/13/20      PT LONG TERM GOAL #4   Title Assess 5x STS progress    Baseline TBD    Time 8    Period Weeks    Status New    Target Date 09/13/20      PT LONG TERM GOAL #5   Title Assess FGA/DGI progress    Baseline TBD    Time 8    Period Weeks    Status New    Target Date 09/13/20      Additional Long Term Goals   Additional Long Term Goals Yes      PT LONG TERM GOAL #6   Title Improve FOTO score to 69    Baseline Initial FOTO score 55    Time 8    Period Weeks    Status New    Target Date 09/14/20                   Plan - 08/16/20 1344     Clinical Impression Statement Continued training with L AFO, able t negotiate a full flight of steps with stap to pattern and R rail, increased step height to 4" for stepping tasks needing RUE support.  Continues to demo ER at R hip and subsequent toe out when ambulating    Personal Factors and Comorbidities Comorbidity 2    Comorbidities CVA, DM    Examination-Activity Limitations Locomotion Level;Transfers    Stability/Clinical Decision Making Stable/Uncomplicated    Rehab Potential Good    PT Frequency 2x / week    PT Duration 8 weeks    PT Treatment/Interventions ADLs/Self Care Home Management;DME Instruction;Gait training;Stair training;Functional mobility training;Therapeutic activities;Therapeutic exercise;Balance training;Neuromuscular re-education;Patient/family education    PT Next Visit Plan standing balance tasks, TKEs, ambulation with AFO to facilitate knee extension, LLE strength and stretch tasks, lateral activities and sidestepping, discourage L toe out with gait    PT Home Exercise Plan [id number]    Consulted and Agree with Plan of Care Patient              Patient will benefit from skilled therapeutic intervention in order to improve the following deficits and impairments:  Abnormal gait, Decreased coordination, Difficulty walking, Decreased endurance, Decreased activity tolerance, Decreased balance, Decreased mobility, Decreased strength  Visit Diagnosis: Spastic 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 ([facility] Unsteadiness on feet     Problem List Patient Active Problem List   Diagnosis Date Noted   Internal carotid artery stenosis, bilateral 06/21/2020   Slow transit constipation    Spastic hemiplegia affecting nondominant side (HCC)    Stage 3b chronic kidney disease ([facility]   Essential hypertension  Controlled type 2 diabetes mellitus with hyperglycemia, without long-term current use of insulin ([place])    Protein-calorie malnutrition, severe 04/25/2020   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 [place], [place], [address] Phone: [phone]   Fax:  [phone]  Name: Bobby Branch MRN: [id number] Date of Birth: 11-16-1960

## 2020-08-17 ENCOUNTER — Other Ambulatory Visit: Payer: Self-pay

## 2020-08-20 ENCOUNTER — Ambulatory Visit: Payer: Federal, State, Local not specified - PPO

## 2020-08-20 ENCOUNTER — Ambulatory Visit: Payer: Federal, State, Local not specified - PPO | Admitting: Occupational Therapy

## 2020-08-20 ENCOUNTER — Other Ambulatory Visit: Payer: Self-pay

## 2020-08-21 ENCOUNTER — Ambulatory Visit: Payer: Federal, State, Local not specified - PPO | Attending: Family Medicine | Admitting: Family Medicine

## 2020-08-21 ENCOUNTER — Other Ambulatory Visit: Payer: Self-pay

## 2020-08-21 DIAGNOSIS — G8110 Spastic hemiplegia affecting unspecified side: Secondary | ICD-10-CM | POA: Diagnosis not present

## 2020-08-21 DIAGNOSIS — E1165 Type 2 diabetes mellitus with hyperglycemia: Secondary | ICD-10-CM | POA: Diagnosis not present

## 2020-08-21 DIAGNOSIS — I6523 Occlusion and stenosis of bilateral carotid arteries: Secondary | ICD-10-CM

## 2020-08-21 DIAGNOSIS — I10 Essential (primary) hypertension: Secondary | ICD-10-CM

## 2020-08-21 MED ORDER — GABAPENTIN 300 MG PO CAPS
300.0000 mg | ORAL_CAPSULE | Freq: Every day | ORAL | 3 refills | Status: DC
  Filled 2020-08-21: qty 30, 30d supply, fill #0
  Filled 2020-10-02: qty 30, 30d supply, fill #1

## 2020-08-21 MED ORDER — DICLOFENAC SODIUM 1 % EX GEL
2.0000 g | Freq: Four times a day (QID) | CUTANEOUS | 1 refills | Status: DC
  Filled 2020-08-21: qty 100, 7d supply, fill #0
  Filled 2020-10-02: qty 100, 7d supply, fill #1

## 2020-08-21 NOTE — Progress Notes (Signed)
Virtual Visit via Telephone Note  I connected with Bobby Branch, on 08/21/2020 at 10:46 AM by telephone due to the COVID-19 pandemic and verified that I am speaking with the correct person using two identifiers.   Consent: I discussed the limitations, risks, security and privacy concerns of performing an evaluation and management service by telephone and the availability of in person appointments. I also discussed with the patient that there may be a patient responsible charge related to this service. The patient expressed understanding and agreed to proceed.   Location of Patient: Home  Location of Provider: Clinic   Persons participating in Telemedicine visit: Bobby Branch Dr. Margarita Rana     History of Present Illness: 60 year old male with a history of type 2 diabetes mellitus, hypertension, right MCA stroke in 06/2020 with left spastic hemiplegia, bilateral carotid stenosis who is seen today to establish care.   He has pain in his L shoulder and received Botox injection 1 week ago. He has weakness in his legs and ambulates with a rollator; also is weak in his L arm. He lives alone and does not have assistance. Currently undergoing physical therapy.  Compliant with his antihypertensive and his statin and he is doing well on glimepiride for his diabetes mellitus.  He does not have a means of checking his blood pressures at home.  Denies problems with any of his medications. He has no acute concerns today. Remains on Aspirin and Plavix.  He has not been seen by neurology.  Carotid Dopplers from 04/2020 revealed: Summary:  Right Carotid: Velocities in the right ICA are consistent with a 60-79%                 stenosis.   Left Carotid: Velocities in the left ICA are consistent with a 80-99%  stenosis.   Follow-up carotid Doppler comes up later this month Past Medical History:  Diagnosis Date   Anxiety    Depression    Diabetes mellitus without complication ([place])    type 2    Dyspnea    inhaler   GERD (gastroesophageal reflux disease)    HLD (hyperlipidemia)    Hypertension    Stroke ([facility]) 09/2019   No Known Allergies  Current Outpatient Medications on File Prior to Visit  Medication Sig Dispense Refill   acetaminophen (TYLENOL) 325 MG tablet Take 2 tablets (650 mg total) by mouth every 4 (four) hours as needed for mild pain (or temp > 37.5 C (99.5 F)).     albuterol (VENTOLIN HFA) 108 (90 Base) MCG/ACT inhaler Inhale 1 puff into the lungs every 6 (six) hours as needed for wheezing or shortness of breath. 8.5 g 0   amLODipine (NORVASC) 10 MG tablet Take 1 tablet (10 mg total) by mouth daily. 30 tablet 3   aspirin 81 MG EC tablet Take 1 tablet (81 mg total) by mouth daily. 30 tablet 11   atorvastatin (LIPITOR) 40 MG tablet Take 1 tablet (40 mg total) by mouth daily. 30 tablet 3   clopidogrel (PLAVIX) 75 MG tablet Take 1 tablet (75 mg total) by mouth daily. 30 tablet 3   diclofenac Sodium (VOLTAREN) 1 % GEL Apply 2 g topically 4 (four) times daily. 100 g 0   docusate sodium (COLACE) 100 MG capsule Take 1 capsule (100 mg total) by mouth 2 (two) times daily. (Patient not taking: No sig reported) 10 capsule 0   escitalopram (LEXAPRO) 10 MG tablet Take 1 tablet (10 mg total) by mouth daily. 30 tablet  3   famotidine (PEPCID) 20 MG tablet Take 1 tablet (20 mg total) by mouth daily. 30 tablet 2   glimepiride (AMARYL) 2 MG tablet Take 1.5 tablets (3 mg total) by mouth daily with breakfast. 45 tablet 2   hydrALAZINE (APRESOLINE) 50 MG tablet Take 1 tablet (50 mg total) by mouth 3 (three) times daily. 90 tablet 2   lidocaine (LIDODERM) 5 % Place 1 patch onto the skin daily. Remove & Discard patch within 12 hours or as directed by MD (Patient not taking: No sig reported) 30 patch 0   Multiple Vitamin (MULTIVITAMIN WITH MINERALS) TABS tablet Take 1 tablet by mouth daily. (Patient not taking: No sig reported)     nicotine (NICODERM CQ - DOSED IN MG/24 HR) 7 mg/24hr patch  Place 1 patch (7 mg total) onto the skin daily. (Patient not taking: No sig reported) 14 patch 0   polyethylene glycol (MIRALAX / GLYCOLAX) 17 g packet Take 17 g by mouth daily. (Patient not taking: No sig reported) 14 each 0   tiZANidine (ZANAFLEX) 2 MG tablet TAKE 1 TABLET(2 MG) BY MOUTH THREE TIMES DAILY 90 tablet 0   No current facility-administered medications on file prior to visit.    ROS: See HPI  Observations/Objective: Awake, alert, oriented x3 Not in acute distress   Lab Results  Component Value Date   HGBA1C 7.3 (H) 04/20/2020    CMP Latest Ref Rng & Units 06/21/2020 05/17/2020 05/13/2020  Glucose 70 - 99 mg/dL 75 153(H) 227(H)  BUN 6 - 20 mg/dL 22(H) 33(H) 27(H)  Creatinine 0.61 - 1.24 mg/dL 2.57(H) 2.13(H) 1.96(H)  Sodium 135 - 145 mmol/L 139 135 136  Potassium 3.5 - 5.1 mmol/L 4.0 4.6 3.8  Chloride 98 - 111 mmol/L 105 104 104  CO2 22 - 32 mmol/L '28 24 25  '$ Calcium 8.9 - 10.3 mg/dL 9.4 9.4 8.8(L)  Total Protein 6.5 - 8.1 g/dL - - -  Total Bilirubin 0.3 - 1.2 mg/dL - - -  Alkaline Phos 38 - 126 U/L - - -  AST 15 - 41 U/L - - -  ALT 0 - 44 U/L - - -    Assessment and Plan: 1. Controlled type 2 diabetes mellitus with hyperglycemia, without long-term current use of insulin (HCC) Controlled with A1c of 7.3 Continue current management Counseled on Diabetic diet, my plate method, [id number] minutes of moderate intensity exercise/week Blood sugar logs with fasting goals of 80-120 mg/dl, random of less than 180 and in the event of sugars less than 60 mg/dl or greater than 400 mg/dl encouraged to notify the clinic. Advised on the need for annual eye exams, annual foot exams, Pneumonia vaccine.  2. Spastic hemiplegia affecting nondominant side (HCC) Status post Botox injection for left shoulder adhesive capsulitis Followed by rehab medicine - diclofenac Sodium (VOLTAREN) 1 % GEL; Apply 2 grams topically 4 (four) times daily.  Dispense: 100 g; Refill: 1 - gabapentin (NEURONTIN)  300 MG capsule; Take 1 capsule (300 mg total) by mouth at bedtime.  Dispense: 30 capsule; Refill: 3  3. Essential hypertension Controlled Continue antihypertensive Counseled on blood pressure goal of less than 130/80, low-sodium, DASH diet, medication compliance, 150 minutes of moderate intensity exercise per week. Discussed medication compliance, adverse effects.  4. Bilateral carotid artery stenosis Keep upcoming appointment for repeat carotid Doppler  5.  History of stroke Left residual hemiparesis Risk factor modification  Follow Up Instructions: 3 months   I discussed the assessment and treatment plan with the  patient. The patient was provided an opportunity to ask questions and all were answered. The patient agreed with the plan and demonstrated an understanding of the instructions.   The patient was advised to call back or seek an in-person evaluation if the symptoms worsen or if the condition fails to improve as anticipated.     I provided 14 minutes total of non-face-to-face time during this encounter.   Charlott Rakes, MD, FAAFP. [facility], [place]   08/21/2020, 10:46 AM

## 2020-08-22 ENCOUNTER — Encounter: Payer: Self-pay | Admitting: Family Medicine

## 2020-08-22 ENCOUNTER — Other Ambulatory Visit: Payer: Self-pay

## 2020-08-22 ENCOUNTER — Telehealth: Payer: Self-pay | Admitting: Family Medicine

## 2020-08-22 ENCOUNTER — Ambulatory Visit: Payer: Federal, State, Local not specified - PPO | Admitting: Occupational Therapy

## 2020-08-22 DIAGNOSIS — I69354 Hemiplegia and hemiparesis following cerebral infarction affecting left non-dominant side: Secondary | ICD-10-CM

## 2020-08-22 DIAGNOSIS — M25642 Stiffness of left hand, not elsewhere classified: Secondary | ICD-10-CM

## 2020-08-22 DIAGNOSIS — G8929 Other chronic pain: Secondary | ICD-10-CM

## 2020-08-22 DIAGNOSIS — I63511 Cerebral infarction due to unspecified occlusion or stenosis of right middle cerebral artery: Secondary | ICD-10-CM

## 2020-08-22 DIAGNOSIS — R2681 Unsteadiness on feet: Secondary | ICD-10-CM

## 2020-08-22 DIAGNOSIS — M25512 Pain in left shoulder: Secondary | ICD-10-CM

## 2020-08-22 DIAGNOSIS — M25612 Stiffness of left shoulder, not elsewhere classified: Secondary | ICD-10-CM

## 2020-08-22 DIAGNOSIS — R41840 Attention and concentration deficit: Secondary | ICD-10-CM

## 2020-08-22 MED ORDER — TIZANIDINE HCL 2 MG PO TABS
ORAL_TABLET | ORAL | 3 refills | Status: DC
  Filled 2020-08-22: qty 90, 30d supply, fill #0
  Filled 2020-10-02 – 2020-10-26 (×2): qty 90, 30d supply, fill #1

## 2020-08-22 NOTE — Telephone Encounter (Signed)
This patient has a CVA with left hemiparesis and currently lives alone.  Can you check to see if his insurance will cover home health PCS services?  Thank you

## 2020-08-22 NOTE — Telephone Encounter (Signed)
SLP Referral Placed today

## 2020-08-22 NOTE — Telephone Encounter (Signed)
PT HAD A MYCHART APPT YESTERDAY W/ DR. Margarita Rana  And is asking if he could get tiZANidine (ZANAFLEX) 2 MG tablet asking if it could be sent to [facility]

## 2020-08-22 NOTE — Telephone Encounter (Signed)
Medication has been sent.  

## 2020-08-22 NOTE — Therapy (Signed)
[facility] [address] [place], [place], [address] Phone: [phone]   Fax:  [phone]  Occupational Therapy Treatment  Patient Details  Name: Bobby Branch MRN: [id number] Date of Birth: 05-01-1960 Referring Provider (OT): Danella Sensing   Encounter Date: 08/22/2020   OT End of Session - 08/22/20 1501     Visit Number 5    Number of Visits 13    Authorization Type Medicaid - 12 visits approved    Progress Note Due on Visit 10    OT Start Time 1315    OT Stop Time 1400    OT Time Calculation (min) 45 min             Past Medical History:  Diagnosis Date   Anxiety    Depression    Diabetes mellitus without complication ([facility])    type 2   Dyspnea    inhaler   GERD (gastroesophageal reflux disease)    HLD (hyperlipidemia)    Hypertension    Stroke ([facility]) 09/2019    Past Surgical History:  Procedure Laterality Date   EYE SURGERY Left    IR ANGIO EXTRACRAN SEL COM CAROTID INNOMINATE UNI L MOD SED  04/20/2020   IR ANGIO INTRA EXTRACRAN SEL COM CAROTID INNOMINATE UNI L MOD SED  06/21/2020   IR ANGIO VERTEBRAL SEL SUBCLAVIAN INNOMINATE UNI R MOD SED  06/21/2020   IR CT HEAD LTD  04/20/2020   IR INTRAVSC STENT CERV CAROTID W/EMB-PROT MOD SED INCL ANGIO  06/21/2020   IR PERCUTANEOUS ART THROMBECTOMY/INFUSION INTRACRANIAL INC DIAG ANGIO  04/20/2020   IR RADIOLOGIST EVAL & MGMT  06/05/2020   IR RADIOLOGIST EVAL & MGMT  07/18/2020   IR US GUIDE VASC ACCESS RIGHT  04/20/2020   IR US GUIDE VASC ACCESS RIGHT  06/21/2020   RADIOLOGY WITH ANESTHESIA N/A 04/20/2020   Procedure: IR WITH ANESTHESIA;  Surgeon: Luanne Bras, MD;  Location: [facility];  Service: Radiology;  Laterality: N/A;   RADIOLOGY WITH ANESTHESIA N/A 06/21/2020   Procedure: RADIOLOGY WITH ANESTHESIA  STENTING;  Surgeon: Corrie Mckusick, DO;  Location: [facility];  Service: Anesthesiology;  Laterality: N/A;    There were no vitals filed for this visit.    Subjective Assessment - 08/22/20 1400     Subjective  Patient requesting water, and then coughing when drinking it.  Voice not clear    Pertinent History R MCA stroke.  PMH:  CKD, DM, malnutrition, HTN, polysubstance abuse, prior stroke 08/21 who presents with spastic left sided hemiplegia                          OT Treatments/Exercises (OP) - 08/22/20 0001       ADLs   Eating Patient requesting water at start of session.  Patient coughing after each sip.  Voice not clear.  Sent message to PM&R NP to request SLP orders.      Neurological Re-education Exercises   Other Exercises 1 Neuromuscular reeducation to address left shoulder pain and activation.  Patient with strong posterior right bias.  Difficulty with all weight shifts onto left side and forward.  In standing facilitated weight shift onto aligned left leg.  Patient initially fearful / resistive - but with small weight shifts, and gentle repetitions, able to shift toward left side.  Worked then with LUE in moveable surface to address active assisted left UE movement - abd/add, flex/ext.  Patient with discomfort with flexion with neutral  rotation even in small ranges.  Pain reported on posterior shoulder joint.                      OT Short Term Goals - 08/22/20 1508       OT SHORT TERM GOAL #1   Title Patient will complete an HEP designed to improve range of motion and decrease pain in LUE    Baseline No HEP    Time 4    Period Weeks    Status On-going    Target Date 09/02/20      OT SHORT TERM GOAL #2   Title Patient will demonstrate sufficient shoulder range of motion to apply deodorant and wash under left arm with pain no greater than 1-2/10    Baseline Difficulty washing and applying deodorant pain 6+/10    Time 4    Period Weeks    Status On-going      OT SHORT TERM GOAL #4   Title Patient will demonstrate 90% composite wrist flexion/extension passively without pain of more than 1-2/10     Baseline Patient unable to tolerate passive stretch to fingers    Time 4    Period Weeks    Status On-going      OT SHORT TERM GOAL #5   Title Patient will demonstrate 90 degrees of passive shoulder flexion in preparation for forward reaching    Baseline 70*    Time 4    Period Weeks    Status On-going               OT Long Term Goals - 08/22/20 1509       OT LONG TERM GOAL #1   Title Patient will complete updated HEP designed to improve functional use of LUE    Baseline No HEP    Time 6    Period Weeks    Status On-going      OT LONG TERM GOAL #2   Title Patient will don/doff left UE positioning devices as warranted    Baseline No current splints or slings    Time 6    Period Weeks    Status On-going      OT LONG TERM GOAL #3   Title Patient will demonstrate ability to cut food on plate    Baseline Unable to cut up food    Time 6    Period Weeks    Status On-going      OT LONG TERM GOAL #4   Title Patient will demonstrate ability to peel/chop vegetables as needed for soup    Baseline Unable to cut veggies    Time 6    Period Weeks    Status On-going                   Plan - 08/22/20 1502     Clinical Impression Statement Patient continues with shoulder pain, limited LUE active movement, and maladaptive movement strategies that exaggerate symptoms.    OT Occupational Profile and History Detailed Assessment- Review of Records and additional review of physical, cognitive, psychosocial history related to current functional performance    Occupational performance deficits (Please refer to evaluation for details): ADL's;IADL's;Rest and Sleep    Body Structure / Function / Physical Skills ADL;Dexterity;Flexibility;Muscle spasms;ROM;Strength;Vision;Tone;IADL;FMC;Edema;Coordination;Balance;Body mechanics;Decreased knowledge of precautions;Endurance;Improper spinal/pelvic alignment;Pain;Sensation;Decreased knowledge of use of DME;GMC;Mobility;Proprioception     Cognitive Skills Attention;Memory;Perception;Energy/Drive;Problem Solve;Safety Awareness    Rehab Potential Good    Clinical Decision Making Several treatment options,  min-mod task modification necessary    Comorbidities Affecting Occupational Performance: May have comorbidities impacting occupational performance    Modification or Assistance to Complete Evaluation  Min-Moderate modification of tasks or assist with assess necessary to complete eval    OT Frequency 2x / week    OT Duration 6 weeks    OT Treatment/Interventions Self-care/ADL training;Therapeutic exercise;DME and/or AE instruction;Functional Mobility Training;Cognitive remediation/compensation;Balance training;Visual/perceptual remediation/compensation;Splinting;Manual Therapy;Neuromuscular education;Fluidtherapy;Electrical Stimulation;Aquatic Barista;Iontophoresis;Passive range of motion;Therapeutic activities;Patient/family education    Plan teach table slides, supine manual therapy and neuro re-ed LUE, ?kinesiotaping for pain/improved positioning L shoulder, education regarding bed positioning LUE.    Consulted and Agree with Plan of Care Patient             Patient will benefit from skilled therapeutic intervention in order to improve the following deficits and impairments:   Body Structure / Function / Physical Skills: ADL, Dexterity, Flexibility, Muscle spasms, ROM, Strength, Vision, Tone, IADL, FMC, Edema, Coordination, Balance, Body mechanics, Decreased knowledge of precautions, Endurance, Improper spinal/pelvic alignment, Pain, Sensation, Decreased knowledge of use of DME, GMC, Mobility, Proprioception Cognitive Skills: Attention, Memory, Perception, Energy/Drive, Problem Solve, Safety Awareness     Visit Diagnosis: Unsteadiness on feet  Chronic left shoulder pain  Stiffness of left shoulder, not elsewhere classified  Spastic hemiplegia of left nondominant side as late effect of  cerebral infarction (HCC)  Stiffness of left hand, not elsewhere classified  Left shoulder pain, unspecified chronicity  Attention and concentration deficit    Problem List Patient Active Problem List   Diagnosis Date Noted   Internal carotid artery stenosis, bilateral 06/21/2020   Slow transit constipation    Spastic hemiplegia affecting nondominant side (HCC)    Stage 3b chronic kidney disease ([facility] [place])    Essential hypertension    Controlled type 2 diabetes mellitus with hyperglycemia, without long-term current use of insulin ([facility])    Protein-calorie malnutrition, severe 04/25/2020   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

## 2020-08-23 ENCOUNTER — Telehealth: Payer: Self-pay

## 2020-08-23 ENCOUNTER — Ambulatory Visit: Payer: Federal, State, Local not specified - PPO | Admitting: Physical Therapy

## 2020-08-23 DIAGNOSIS — I69354 Hemiplegia and hemiparesis following cerebral infarction affecting left non-dominant side: Secondary | ICD-10-CM | POA: Diagnosis not present

## 2020-08-23 DIAGNOSIS — R2681 Unsteadiness on feet: Secondary | ICD-10-CM

## 2020-08-23 DIAGNOSIS — I69351 Hemiplegia and hemiparesis following cerebral infarction affecting right dominant side: Secondary | ICD-10-CM

## 2020-08-23 NOTE — Telephone Encounter (Signed)
Can you please share this with him and the options that are available to him for meals and otherwise. Let me know if I need to do anything additional. Thank you.

## 2020-08-23 NOTE — Therapy (Signed)
[facility] [address] [place], [place], [address] Phone: [phone]   Fax:  [phone]  Physical Therapy Treatment  Patient Details  Name: Bobby Branch MRN: [id number] Date of Birth: 1960/10/08 Referring Provider (PT): Danella Sensing NP   Encounter Date: 08/23/2020   PT End of Session - 08/23/20 1240     Visit Number 7    Number of Visits 17    Date for PT Re-Evaluation 09/13/20    Authorization Type [employer] Medicaid & [employer]    Authorization Time Period [employer] approved 12 visits then auth required for 13th visits and beyond    Authorization - Visit Number 6    Authorization - Number of Visits 12    Progress Note Due on Visit 10    PT Start Time [id number]    PT Stop Time 1315    PT Time Calculation (min) 40 min    Equipment Utilized During Treatment Gait belt    Activity Tolerance Patient tolerated treatment well    Behavior During Therapy WFL for tasks assessed/performed             Past Medical History:  Diagnosis Date   Anxiety    Depression    Diabetes mellitus without complication ([facility])    type 2   Dyspnea    inhaler   GERD (gastroesophageal reflux disease)    HLD (hyperlipidemia)    Hypertension    Stroke ([facility]) 09/2019    Past Surgical History:  Procedure Laterality Date   EYE SURGERY Left    IR ANGIO EXTRACRAN SEL COM CAROTID INNOMINATE UNI L MOD SED  04/20/2020   IR ANGIO INTRA EXTRACRAN SEL COM CAROTID INNOMINATE UNI L MOD SED  06/21/2020   IR ANGIO VERTEBRAL SEL SUBCLAVIAN INNOMINATE UNI R MOD SED  06/21/2020   IR CT HEAD LTD  04/20/2020   IR INTRAVSC STENT CERV CAROTID W/EMB-PROT MOD SED INCL ANGIO  06/21/2020   IR PERCUTANEOUS ART THROMBECTOMY/INFUSION INTRACRANIAL INC DIAG ANGIO  04/20/2020   IR RADIOLOGIST EVAL & MGMT  06/05/2020   IR RADIOLOGIST EVAL & MGMT  07/18/2020   IR US GUIDE VASC ACCESS RIGHT  04/20/2020   IR US GUIDE VASC ACCESS RIGHT  06/21/2020   RADIOLOGY WITH ANESTHESIA  N/A 04/20/2020   Procedure: IR WITH ANESTHESIA;  Surgeon: Luanne Bras, MD;  Location: [facility];  Service: Radiology;  Laterality: N/A;   RADIOLOGY WITH ANESTHESIA N/A 06/21/2020   Procedure: RADIOLOGY WITH ANESTHESIA  STENTING;  Surgeon: Corrie Mckusick, DO;  Location: [facility];  Service: Anesthesiology;  Laterality: N/A;    There were no vitals filed for this visit.   Subjective Assessment - 08/23/20 1239     Subjective No new complaints. No falls. Reports shoulder pain, mostly at night- OT is addressing this.    Pertinent History 60 y.o. 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 s/p emergent cerebral angiogram with  successful thrombectomy with small volume SAH sylvian sulci; cocaine +    Limitations Standing    How long can you sit comfortably? unlimited    How long can you stand comfortably? <5 min    How long can you walk comfortably? <10 min    Patient Stated Goals To be able to walk again by myself    Currently in Pain? No/denies    Pain  Score 0-No pain                   [facility] Adult PT Treatment/Exercise - 08/23/20 1240       Transfers   Transfers Sit to Stand;Stand to Sit    Sit to Stand 6: Modified independent (Device/Increase time)    Stand to Sit 6: Modified independent (Device/Increase time)      Ambulation/Gait   Ambulation/Gait Yes    Ambulation/Gait Assistance 4: Min guard    Ambulation Distance (Feet) --   around clinic with session   Assistive device None    Gait Pattern Step-through pattern;Decreased stride length;Decreased dorsiflexion - left;Decreased weight shift to left;Decreased stance time - left;Poor foot clearance - left;Decreased hip/knee flexion - left    Ambulation Surface Level;Indoor      Knee/Hip Exercises: Aerobic   Other Aerobic Scifit bil LE's/right UE on level 2.5 x 8  minutes with goal >/= 40 steps per minute for strengthening. use of gait belt around thighs to help left LE stay in neutral and on ER.      Knee/Hip Exercises: Seated   Long Arc Quad AROM;AAROM;Strengthening;Both;1 set;10 reps;Limitations;Weights    Long Arc Quad Weight 2 lbs.    Long [employer] Limitations alternating with lat press bil UE's on foam roll between LEs. continued cues needed to maintain upright posture with assist for full range on left      Knee/Hip Exercises: Supine   Bridges AROM;Strengthening;Both;1 set;10 reps;Limitations    Bridges Limitations with all bridge sets performed pt needed cues for full pelvic lift. with bil LE's had pt hold each rep for 5 sec's; with single leg bridge pt needed assist to stabilize left knee with lifting of pelvis; with bridge with ball squeeze had pt squeeze yoga block with each rep with assist to keep yoga block in place after each rep with pt resting.    Single Leg Bridge AROM;AAROM;Strengthening;Left;1 set;10 reps;Limitations    Other Supine Knee/Hip Exercises hooklying on mat table: hip falls outs on left with red band for 10 reps; then clam shells for 10 reps with red band. PTA hand used as targets    Other Supine Knee/Hip Exercises hooklying at edge of mat: with left knee staying bent had pt lower left foot to floor<>back onto mat table for 10 reps with assist for form/mat clearance at times.                      PT Short Term Goals - 08/10/20 1334       PT SHORT TERM GOAL #1   Title Patient to demo I in initial HEP    Baseline TBD; 07/19/20 Initial HEP issued today    Time 4    Period Weeks    Status On-going    Target Date 09/13/20      PT SHORT TERM GOAL #2   Title Patient to transfer sit/stand under S only    Baseline SBA to perform sit/stand transfer.    Time 4    Period Weeks    Status On-going    Target Date 08/16/20      PT SHORT TERM GOAL #3   Title Patient to ambulate 26f with hemiwalker and S     Baseline 1171fwith hemiwalker and CGA; 08/10/20 CGA with L AFO [id number] SPC    Time 4    Period Weeks    Status Achieved    Target Date 08/16/20  PT SHORT TERM GOAL #4   Title Assess 5x STS time and set appropriate goal    Baseline TBD; 07/23/20 5x STS 26.8s    Time 4    Period Weeks    Status Achieved    Target Date 08/16/20      PT SHORT TERM GOAL #5   Title Assess FGA/DGI and set appropriate goal    Baseline TBD; 07/23/20 DGI score 16, goal is 20    Time 4    Period Weeks    Status Achieved    Target Date 08/16/20               PT Long Term Goals - 07/12/20 1643       PT LONG TERM GOAL #1   Title Patient to ambulate 563f with LRAD under S    Baseline 1161fwith hemiwalker and CGA    Time 8    Period Weeks    Status New    Target Date 09/13/20      PT LONG TERM GOAL #2   Title Patient to increase gait velocity to 0.4 m/s across 102m Baseline Patient demos gait velocity of 0.26 m/s across 46m81mTime 8    Period Weeks    Status New    Target Date 09/13/20      PT LONG TERM GOAL #3   Title patient to demo 1/2 grade improvement in LLE strength throughout    Baseline LLE strength ranges fro 2-3+/5 throughout    Time 8    Period Weeks    Status New    Target Date 09/13/20      PT LONG TERM GOAL #4   Title Assess 5x STS progress    Baseline TBD    Time 8    Period Weeks    Status New    Target Date 09/13/20      PT LONG TERM GOAL #5   Title Assess FGA/DGI progress    Baseline TBD    Time 8    Period Weeks    Status New    Target Date 09/13/20      Additional Long Term Goals   Additional Long Term Goals Yes      PT LONG TERM GOAL #6   Title Improve FOTO score to 69    Baseline Initial FOTO score 55    Time 8    Period Weeks    Status New    Target Date 09/14/20                   Plan - 08/23/20 1247     Clinical Impression Statement Today's skilled session focused on LE strengthening, specifically left LE, for improved  hip/knee flexion in swing phase of gait to assist with foot clearance and decreased ER. No issues reported or noted with session. The pt is making progress and should benefit from continued PT to progress toward unmet goals.    Personal Factors and Comorbidities Comorbidity 2    Comorbidities CVA, DM    Examination-Activity Limitations Locomotion Level;Transfers    Stability/Clinical Decision Making Stable/Uncomplicated    Rehab Potential Good    PT Frequency 2x / week    PT Duration 8 weeks    PT Treatment/Interventions ADLs/Self Care Home Management;DME Instruction;Gait training;Stair training;Functional mobility training;Therapeutic activities;Therapeutic exercise;Balance training;Neuromuscular re-education;Patient/family education    PT Next Visit Plan standing balance tasks, TKEs, ambulation with AFO to facilitate knee extension, LLE strength and  stretch tasks, lateral activities and sidestepping, discourage L toe out with gait    PT Home Exercise Plan [id number]    Consulted and Agree with Plan of Care Patient             Patient will benefit from skilled therapeutic intervention in order to improve the following deficits and impairments:  Abnormal gait, Decreased coordination, Difficulty walking, Decreased endurance, Decreased activity tolerance, Decreased balance, Decreased mobility, Decreased strength  Visit Diagnosis: Unsteadiness on feet  Hemiplegia and hemiparesis following cerebral infarction affecting right dominant side [facility])     Problem List Patient Active Problem List   Diagnosis Date Noted   Internal carotid artery stenosis, bilateral 06/21/2020   Slow transit constipation    Spastic hemiplegia affecting nondominant side (HCC)    Stage 3b chronic kidney disease ([place])    Essential hypertension    Controlled type 2 diabetes mellitus with hyperglycemia, without long-term current use of insulin ([facility])    Protein-calorie malnutrition, severe 04/25/2020   Right middle  cerebral artery stroke ([facility]) 04/23/2020   Acute right MCA stroke ([facility]) 04/20/2020   Willow Ora, PTA, [facility] [address] [place], [place] [address] [phone] 08/23/20, 2:21 PM   Name: Bobby Branch MRN: [id number] Date of Birth: 17-Jun-1960

## 2020-08-23 NOTE — Telephone Encounter (Signed)
Call placed to patient # [phone] to discuss community resources/PSC  voicemail full, unable to leave a message

## 2020-08-27 ENCOUNTER — Ambulatory Visit: Payer: Federal, State, Local not specified - PPO | Admitting: Occupational Therapy

## 2020-08-27 ENCOUNTER — Ambulatory Visit: Payer: Federal, State, Local not specified - PPO

## 2020-08-27 NOTE — Telephone Encounter (Addendum)
Call placed to [facility] # [phone] spoke to [place] regarding personal care services.  She checked Dr Smitty Pluck NPI and the TIN for [facility] and explained that the referral for services needs to be faxed to Utilization Management - fax # [phone] for review.  The phone number for Utilization Management  # [phone]. She did not state that the provider is out of network.    Call placed to patient to discuss community resources. He explained that he lives alone and needs assistance with bathing, meal preparation and light housekeeping.  Explained to him information about PCS and there is no guarantee that he will be approved for services and no guarantee that the referral will be accepted from Dr Margarita Rana as [facility] is not in network with [facility].  He said that he understood and was in agreement to submitting the referral.  He currently has no income. He stated that he lives alone and only receives about $200/month in food stamps. He hopes to soon be approved for SSI. He attends outpatient PT and said that he uses [facility] [employer] services to get to medical appointments.   He was also in agreement to submitting a referral to [employer] for grocery assistance.  That referral was then sent to attention of Tommie Raymond.

## 2020-08-28 NOTE — Telephone Encounter (Signed)
Completed PCS form faxed to [facility] Utilization Management

## 2020-08-30 ENCOUNTER — Encounter: Payer: Self-pay | Admitting: Physical Therapy

## 2020-08-30 ENCOUNTER — Other Ambulatory Visit: Payer: Self-pay

## 2020-08-30 ENCOUNTER — Ambulatory Visit: Payer: Federal, State, Local not specified - PPO

## 2020-08-30 ENCOUNTER — Ambulatory Visit: Payer: Federal, State, Local not specified - PPO | Admitting: Physical Therapy

## 2020-08-30 ENCOUNTER — Ambulatory Visit: Payer: Federal, State, Local not specified - PPO | Admitting: Occupational Therapy

## 2020-08-30 ENCOUNTER — Encounter: Payer: Self-pay | Admitting: Occupational Therapy

## 2020-08-30 DIAGNOSIS — I69354 Hemiplegia and hemiparesis following cerebral infarction affecting left non-dominant side: Secondary | ICD-10-CM | POA: Diagnosis not present

## 2020-08-30 DIAGNOSIS — R41841 Cognitive communication deficit: Secondary | ICD-10-CM

## 2020-08-30 DIAGNOSIS — I69351 Hemiplegia and hemiparesis following cerebral infarction affecting right dominant side: Secondary | ICD-10-CM

## 2020-08-30 DIAGNOSIS — M25642 Stiffness of left hand, not elsewhere classified: Secondary | ICD-10-CM

## 2020-08-30 DIAGNOSIS — G8929 Other chronic pain: Secondary | ICD-10-CM

## 2020-08-30 DIAGNOSIS — R471 Dysarthria and anarthria: Secondary | ICD-10-CM

## 2020-08-30 DIAGNOSIS — R2681 Unsteadiness on feet: Secondary | ICD-10-CM

## 2020-08-30 DIAGNOSIS — R41840 Attention and concentration deficit: Secondary | ICD-10-CM

## 2020-08-30 DIAGNOSIS — M25512 Pain in left shoulder: Secondary | ICD-10-CM

## 2020-08-30 DIAGNOSIS — M25612 Stiffness of left shoulder, not elsewhere classified: Secondary | ICD-10-CM

## 2020-08-30 DIAGNOSIS — R1310 Dysphagia, unspecified: Secondary | ICD-10-CM

## 2020-08-30 NOTE — Therapy (Signed)
[facility] [address] [place], [place], [address] Phone: [phone]   Fax:  [phone]  Physical Therapy Treatment  Patient Details  Name: Bobby Branch MRN: [id number] Date of Birth: 02-Jun-1960 Referring Provider (PT): Danella Sensing NP   Encounter Date: 08/30/2020   PT End of Session - 08/30/20 1240     Visit Number 8    Number of Visits 17    Date for PT Re-Evaluation 09/13/20    Authorization Type [employer] Medicaid & [employer]. Auth required for 13th visits and beyond (the initial 12 approved visits are PT/OT/ST combined. [employer] will take over once Medicaid is exhausted.    Authorization Time Period [employer] approved 12 visits total combined PT/OT/ST. 12th visit reached on 08/30/20. request for more visits/auth has been submitted on this date.    Authorization - Visit Number --    Authorization - Number of Visits 12    Progress Note Due on Visit 10    PT Start Time [id number]    PT Stop Time 1315    PT Time Calculation (min) 42 min    Equipment Utilized During Treatment Gait belt    Activity Tolerance Patient tolerated treatment well    Behavior During Therapy WFL for tasks assessed/performed             Past Medical History:  Diagnosis Date   Anxiety    Depression    Diabetes mellitus without complication (HCC)    type 2   Dyspnea    inhaler   GERD (gastroesophageal reflux disease)    HLD (hyperlipidemia)    Hypertension    Stroke ([place]) 09/2019    Past Surgical History:  Procedure Laterality Date   EYE SURGERY Left    IR ANGIO EXTRACRAN SEL COM CAROTID INNOMINATE UNI L MOD SED  04/20/2020   IR ANGIO INTRA EXTRACRAN SEL COM CAROTID INNOMINATE UNI L MOD SED  06/21/2020   IR ANGIO VERTEBRAL SEL SUBCLAVIAN INNOMINATE UNI R MOD SED  06/21/2020   IR CT HEAD LTD  04/20/2020   IR INTRAVSC STENT CERV CAROTID W/EMB-PROT MOD SED INCL ANGIO  06/21/2020   IR PERCUTANEOUS ART THROMBECTOMY/INFUSION INTRACRANIAL INC  DIAG ANGIO  04/20/2020   IR RADIOLOGIST EVAL & MGMT  06/05/2020   IR RADIOLOGIST EVAL & MGMT  07/18/2020   IR US GUIDE VASC ACCESS RIGHT  04/20/2020   IR US GUIDE VASC ACCESS RIGHT  06/21/2020   RADIOLOGY WITH ANESTHESIA N/A 04/20/2020   Procedure: IR WITH ANESTHESIA;  Surgeon: Luanne Bras, MD;  Location: [facility];  Service: Radiology;  Laterality: N/A;   RADIOLOGY WITH ANESTHESIA N/A 06/21/2020   Procedure: RADIOLOGY WITH ANESTHESIA  STENTING;  Surgeon: Corrie Mckusick, DO;  Location: [facility];  Service: Anesthesiology;  Laterality: N/A;    There were no vitals filed for this visit.   Subjective Assessment - 08/30/20 1237     Subjective No falls. Does report mouth pain from a tooth ache, planning go see someone today about it, taking Advil to help with pain.    Pertinent History 60 y.o. 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 s/p emergent cerebral angiogram with  successful thrombectomy with small volume SAH sylvian sulci; cocaine +    Limitations Standing    How long can you sit comfortably? unlimited    How long can  you stand comfortably? <5 min    How long can you walk comfortably? <10 min    Patient Stated Goals To be able to walk again by myself    Currently in Pain? Yes    Pain Score 4     Pain Location Teeth    Pain Orientation Right    Pain Descriptors / Indicators Sore;Sharp;Stabbing;Discomfort    Pain Type Acute pain    Pain Onset In the past 7 days    Pain Frequency Constant    Aggravating Factors  unsure, tooth hurts    Pain Relieving Factors Advil, ibuprofen    Multiple Pain Sites Yes    Pain Score 3    Pain Location Shoulder    Pain Orientation Left    Pain Descriptors / Indicators Aching    Pain Type Chronic pain    Pain Frequency Intermittent    Aggravating Factors  certain movements, rainy weather     Pain Relieving Factors meds, rest                    [facility] Adult PT Treatment/Exercise - 08/30/20 1241       Transfers   Transfers Sit to Stand;Stand to Sit    Sit to Stand 6: Modified independent (Device/Increase time)    Stand to Sit 6: Modified independent (Device/Increase time)      Ambulation/Gait   Ambulation/Gait Yes    Ambulation/Gait Assistance 5: Supervision;4: Min guard    Ambulation/Gait Assistance Details use of rollator with single UE support to enter/exit session with supervision. no device with clinic [place] posterior brace used for gait/balance activities with min guard assist for safety.    Ambulation Distance (Feet) 80 Feet   x2, plus around clinic with session   Assistive device Rollator    Gait Pattern Step-through pattern;Decreased stride length;Decreased dorsiflexion - left;Decreased weight shift to left;Decreased stance time - left;Poor foot clearance - left;Decreased hip/knee flexion - left    Ambulation Surface Level;Indoor      High Level Balance   High Level Balance Activities Side stepping;Marching forwards;Backward walking    High Level Balance Comments in parallel bars for 1 lap with single UE support, then 3 laps with  no UE support, all on solid floor. Min guard assist for safety with cues on correct ex form and technique.      Knee/Hip Exercises: Aerobic   Other Aerobic Scifit bil LE's/right UE on level 3.0 x 8 minutes with goal >/= 40 steps per minute for strengthening. use of gait belt around thighs to help left LE stay in neutral and on ER.                      PT Short Term Goals - 08/10/20 1334       PT SHORT TERM GOAL #1   Title Patient to demo I in initial HEP    Baseline TBD; 07/19/20 Initial HEP issued today    Time 4    Period Weeks    Status On-going    Target Date 09/13/20      PT SHORT TERM GOAL #2   Title Patient to transfer sit/stand under S only    Baseline SBA to perform sit/stand transfer.    Time 4     Period Weeks    Status On-going    Target Date 08/16/20      PT SHORT TERM GOAL #3   Title Patient to ambulate 214f with hemiwalker and S  Baseline 173f with hemiwalker and CGA; 08/10/20 CGA with L AFO 2321fusing SPC    Time 4    Period Weeks    Status Achieved    Target Date 08/16/20      PT SHORT TERM GOAL #4   Title Assess 5x STS time and set appropriate goal    Baseline TBD; 07/23/20 5x STS 26.8s    Time 4    Period Weeks    Status Achieved    Target Date 08/16/20      PT SHORT TERM GOAL #5   Title Assess FGA/DGI and set appropriate goal    Baseline TBD; 07/23/20 DGI score 16, goal is 20    Time 4    Period Weeks    Status Achieved    Target Date 08/16/20               PT Long Term Goals - 07/12/20 1643       PT LONG TERM GOAL #1   Title Patient to ambulate 50035fith LRAD under S    Baseline 115f50fth hemiwalker and CGA    Time 8    Period Weeks    Status New    Target Date 09/13/20      PT LONG TERM GOAL #2   Title Patient to increase gait velocity to 0.4 m/s across 55m 35maseline Patient demos gait velocity of 0.26 m/s across 55m  58mme 8    Period Weeks    Status New    Target Date 09/13/20      PT LONG TERM GOAL #3   Title patient to demo 1/2 grade improvement in LLE strength throughout    Baseline LLE strength ranges fro 2-3+/5 throughout    Time 8    Period Weeks    Status New    Target Date 09/13/20      PT LONG TERM GOAL #4   Title Assess 5x STS progress    Baseline TBD    Time 8    Period Weeks    Status New    Target Date 09/13/20      PT LONG TERM GOAL #5   Title Assess FGA/DGI progress    Baseline TBD    Time 8    Period Weeks    Status New    Target Date 09/13/20      Additional Long Term Goals   Additional Long Term Goals Yes      PT LONG TERM GOAL #6   Title Improve FOTO score to 69    Baseline Initial FOTO score 55    Time 8    Period Weeks    Status New    Target Date 09/14/20                    Plan - 08/30/20 1241     Clinical Impression Statement Today's skilled session continued to focus on strengthening and use of posterior brace for improved LE support with gait and other activities in session. Pt demo's improved foot clearance with use of brace. The pt is progressing and should benefit from continued PT to progress toward unmet goals.    Personal Factors and Comorbidities Comorbidity 2    Comorbidities CVA, DM    Examination-Activity Limitations Locomotion Level;Transfers    Stability/Clinical Decision Making Stable/Uncomplicated    Rehab Potential Good    PT Frequency 2x / week    PT Duration 8 weeks  PT Treatment/Interventions ADLs/Self Care Home Management;DME Instruction;Gait training;Stair training;Functional mobility training;Therapeutic activities;Therapeutic exercise;Balance training;Neuromuscular re-education;Patient/family education    PT Next Visit Plan standing balance tasks, TKEs, ambulation with AFO to facilitate knee extension, LLE strength and stretch tasks, lateral activities and sidestepping, discourage L toe out with gait    PT Home Exercise Plan [id number]    Consulted and Agree with Plan of Care Patient             Patient will benefit from skilled therapeutic intervention in order to improve the following deficits and impairments:  Abnormal gait, Decreased coordination, Difficulty walking, Decreased endurance, Decreased activity tolerance, Decreased balance, Decreased mobility, Decreased strength  Visit Diagnosis: Unsteadiness on feet  Hemiplegia and hemiparesis following cerebral infarction affecting right dominant side [facility])     Problem List Patient Active Problem List   Diagnosis Date Noted   Internal carotid artery stenosis, bilateral 06/21/2020   Slow transit constipation    Spastic hemiplegia affecting nondominant side (HCC)    Stage 3b chronic kidney disease ([place])    Essential hypertension    Controlled type 2  diabetes mellitus with hyperglycemia, without long-term current use of insulin ([place])    Protein-calorie malnutrition, severe 04/25/2020   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

## 2020-08-30 NOTE — Patient Instructions (Signed)
   I will contact the doctor who referred you here and ask her to send you for a swallow test.     Do 5-10 LOUD "[place]"  twice a day  Read 20 sentences LOUD!  Twice a day

## 2020-08-30 NOTE — Therapy (Signed)
[facility] [address] [place], [place], [address] Phone: [phone]   Fax:  [phone]  Occupational Therapy Treatment  Patient Details  Name: Bobby Branch MRN: [id number] Date of Birth: 1960/06/03 Referring Provider (OT): Danella Sensing   Encounter Date: 08/30/2020   OT End of Session - 08/30/20 1318     Visit Number 6    Number of Visits 13    Authorization Type Medicaid - 12 visits approved    Progress Note Due on Visit 10    OT Start Time 1318    OT Stop Time 1400    OT Time Calculation (min) 42 min    Activity Tolerance Patient tolerated treatment well    Behavior During Therapy WFL for tasks assessed/performed             Past Medical History:  Diagnosis Date   Anxiety    Depression    Diabetes mellitus without complication ([place])    type 2   Dyspnea    inhaler   GERD (gastroesophageal reflux disease)    HLD (hyperlipidemia)    Hypertension    Stroke ([place] [place]) 09/2019    Past Surgical History:  Procedure Laterality Date   EYE SURGERY Left    IR ANGIO EXTRACRAN SEL COM CAROTID INNOMINATE UNI L MOD SED  04/20/2020   IR ANGIO INTRA EXTRACRAN SEL COM CAROTID INNOMINATE UNI L MOD SED  06/21/2020   IR ANGIO VERTEBRAL SEL SUBCLAVIAN INNOMINATE UNI R MOD SED  06/21/2020   IR CT HEAD LTD  04/20/2020   IR INTRAVSC STENT CERV CAROTID W/EMB-PROT MOD SED INCL ANGIO  06/21/2020   IR PERCUTANEOUS ART THROMBECTOMY/INFUSION INTRACRANIAL INC DIAG ANGIO  04/20/2020   IR RADIOLOGIST EVAL & MGMT  06/05/2020   IR RADIOLOGIST EVAL & MGMT  07/18/2020   IR US GUIDE VASC ACCESS RIGHT  04/20/2020   IR US GUIDE VASC ACCESS RIGHT  06/21/2020   RADIOLOGY WITH ANESTHESIA N/A 04/20/2020   Procedure: IR WITH ANESTHESIA;  Surgeon: Luanne Bras, MD;  Location: [facility];  Service: Radiology;  Laterality: N/A;   RADIOLOGY WITH ANESTHESIA N/A 06/21/2020   Procedure: RADIOLOGY WITH ANESTHESIA  STENTING;  Surgeon: Corrie Mckusick, DO;   Location: [facility];  Service: Anesthesiology;  Laterality: N/A;    There were no vitals filed for this visit.   Subjective Assessment - 08/30/20 1317     Subjective  I'm gonna go to the emergency room after this to get some antibiotics - i've eaten about a whole bottle of this in a day (Advil)    Pertinent History R MCA stroke.  PMH:  CKD, DM, malnutrition, HTN, polysubstance abuse, prior stroke 08/21 who presents with spastic left sided hemiplegia    Currently in Pain? Yes    Pain Score 5     Pain Location Teeth    Pain Orientation Right    Pain Descriptors / Indicators Sore;Sharp;Stabbing;Discomfort    Pain Type Acute pain    Pain Onset In the past 7 days    Pain Frequency Constant    Pain Relieving Factors Advil                          OT Treatments/Exercises (OP) - 08/30/20 1633       ADLs   ADL Comments Pt upset with lack of assistance that he has at home. OT previously said was seeking out social work but will follow up and see if a  referral needs to be placed. Pt seeing ST today for evaluation regarding swallowing and speech deficits.    ADL Education Given Yes    General Comments discussed different tools for cutting up food: i.e. rocker knife and adapted cutting board      Splinting   Splinting resting hand splint is fitting well and patient reports wearing it for 2 hours at a time per OT recommendation previously.      Manual Therapy   Manual Therapy Taping;Edema management    Edema Management assisted patient with glove for LUE. Pt unable to don glove himself therefore has not been wearing consistently d/t limited caregiver assitsance at home.    Kinesiotex Facilitate Muscle   to LUE shoulder to assist with shoulder pain and facilitating muscle for support                     OT Short Term Goals - 08/22/20 1508       OT SHORT TERM GOAL #1   Title Patient will complete an HEP designed to improve range of motion and decrease pain in LUE     Baseline No HEP    Time 4    Period Weeks    Status On-going    Target Date 09/02/20      OT SHORT TERM GOAL #2   Title Patient will demonstrate sufficient shoulder range of motion to apply deodorant and wash under left arm with pain no greater than 1-2/10    Baseline Difficulty washing and applying deodorant pain 6+/10    Time 4    Period Weeks    Status On-going      OT SHORT TERM GOAL #4   Title Patient will demonstrate 90% composite wrist flexion/extension passively without pain of more than 1-2/10    Baseline Patient unable to tolerate passive stretch to fingers    Time 4    Period Weeks    Status On-going      OT SHORT TERM GOAL #5   Title Patient will demonstrate 90 degrees of passive shoulder flexion in preparation for forward reaching    Baseline 70*    Time 4    Period Weeks    Status On-going               OT Long Term Goals - 08/22/20 1509       OT LONG TERM GOAL #1   Title Patient will complete updated HEP designed to improve functional use of LUE    Baseline No HEP    Time 6    Period Weeks    Status On-going      OT LONG TERM GOAL #2   Title Patient will don/doff left UE positioning devices as warranted    Baseline No current splints or slings    Time 6    Period Weeks    Status On-going      OT LONG TERM GOAL #3   Title Patient will demonstrate ability to cut food on plate    Baseline Unable to cut up food    Time 6    Period Weeks    Status On-going      OT LONG TERM GOAL #4   Title Patient will demonstrate ability to peel/chop vegetables as needed for soup    Baseline Unable to cut veggies    Time 6    Period Weeks    Status On-going  Plan - 08/30/20 1637     Clinical Impression Statement Patient continues with shoulder pain, limited LUE active movement, and maladaptive movement strategies that exaggerate symptoms. Provided KT tape to LUE - will check to see how it went    OT Occupational Profile and  History Detailed Assessment- Review of Records and additional review of physical, cognitive, psychosocial history related to current functional performance    Occupational performance deficits (Please refer to evaluation for details): ADL's;IADL's;Rest and Sleep    Body Structure / Function / Physical Skills ADL;Dexterity;Flexibility;Muscle spasms;ROM;Strength;Vision;Tone;IADL;FMC;Edema;Coordination;Balance;Body mechanics;Decreased knowledge of precautions;Endurance;Improper spinal/pelvic alignment;Pain;Sensation;Decreased knowledge of use of DME;GMC;Mobility;Proprioception    Cognitive Skills Attention;Memory;Perception;Energy/Drive;Problem Solve;Safety Awareness    Rehab Potential Good    Clinical Decision Making Several treatment options, min-mod task modification necessary    Comorbidities Affecting Occupational Performance: May have comorbidities impacting occupational performance    Modification or Assistance to Complete Evaluation  Min-Moderate modification of tasks or assist with assess necessary to complete eval    OT Frequency 2x / week    OT Duration 6 weeks    OT Treatment/Interventions Self-care/ADL training;Therapeutic exercise;DME and/or AE instruction;Functional Mobility Training;Cognitive remediation/compensation;Balance training;Visual/perceptual remediation/compensation;Splinting;Manual Therapy;Neuromuscular education;Fluidtherapy;Electrical Stimulation;Aquatic Barista;Iontophoresis;Passive range of motion;Therapeutic activities;Patient/family education    Plan teach table slides, supine manual therapy and neuro re-ed LUE, check on tape on LUE, regarding bed positioning LUE.    Consulted and Agree with Plan of Care Patient             Patient will benefit from skilled therapeutic intervention in order to improve the following deficits and impairments:   Body Structure / Function / Physical Skills: ADL, Dexterity, Flexibility, Muscle spasms, ROM,  Strength, Vision, Tone, IADL, FMC, Edema, Coordination, Balance, Body mechanics, Decreased knowledge of precautions, Endurance, Improper spinal/pelvic alignment, Pain, Sensation, Decreased knowledge of use of DME, GMC, Mobility, Proprioception Cognitive Skills: Attention, Memory, Perception, Energy/Drive, Problem Solve, Safety Awareness     Visit Diagnosis: Hemiplegia and hemiparesis following cerebral infarction affecting right dominant side (HCC)  Unsteadiness on feet  Stiffness of left shoulder, not elsewhere classified  Spastic 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 (HCC)  Chronic left shoulder pain  Stiffness of left hand, not elsewhere classified  Left shoulder pain, unspecified chronicity  Attention and concentration deficit    Problem List Patient Active Problem List   Diagnosis Date Noted   Internal carotid artery stenosis, bilateral 06/21/2020   Slow transit constipation    Spastic hemiplegia affecting nondominant side (HCC)    Stage 3b chronic kidney disease ([place])    Essential hypertension    Controlled type 2 diabetes mellitus with hyperglycemia, without long-term current use of insulin ([place])    Protein-calorie malnutrition, severe 04/25/2020   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 [address] [place], [place], [address] Phone: [phone]   Fax:  [phone]  Name: Michael Dafoe MRN: [id number] Date of Birth: 10-23-1960

## 2020-08-31 ENCOUNTER — Telehealth (HOSPITAL_COMMUNITY): Payer: Self-pay

## 2020-08-31 ENCOUNTER — Encounter: Payer: Federal, State, Local not specified - PPO | Admitting: Vascular Surgery

## 2020-08-31 NOTE — Telephone Encounter (Signed)
Attempted to contact patient to schedule OP MBS - voicemail is full. Sent SMS message via voicemail to return call.

## 2020-08-31 NOTE — Therapy (Signed)
[facility] [address] [place], [place], [address] Phone: [phone]   Fax:  [phone]  Speech Language Pathology Evaluation  Patient Details  Name: Bobby Branch MRN: [id number] Date of Birth: 07-17-1960 Referring Provider (SLP): Danella Sensing, NP   Encounter Date: 08/30/2020   End of Session - 08/31/20 0853     Visit Number 1    Number of Visits 25    Date for SLP Re-Evaluation 11/28/20    Authorization Type BCBS and Medicaid    SLP Start Time 1405    SLP Stop Time  1445    SLP Time Calculation (min) 40 min    Activity Tolerance Patient tolerated treatment well             Past Medical History:  Diagnosis Date   Anxiety    Depression    Diabetes mellitus without complication ([facility])    type 2   Dyspnea    inhaler   GERD (gastroesophageal reflux disease)    HLD (hyperlipidemia)    Hypertension    Stroke ([facility]) 09/2019    Past Surgical History:  Procedure Laterality Date   EYE SURGERY Left    IR ANGIO EXTRACRAN SEL COM CAROTID INNOMINATE UNI L MOD SED  04/20/2020   IR ANGIO INTRA EXTRACRAN SEL COM CAROTID INNOMINATE UNI L MOD SED  06/21/2020   IR ANGIO VERTEBRAL SEL SUBCLAVIAN INNOMINATE UNI R MOD SED  06/21/2020   IR CT HEAD LTD  04/20/2020   IR INTRAVSC STENT CERV CAROTID W/EMB-PROT MOD SED INCL ANGIO  06/21/2020   IR PERCUTANEOUS ART THROMBECTOMY/INFUSION INTRACRANIAL INC DIAG ANGIO  04/20/2020   IR RADIOLOGIST EVAL & MGMT  06/05/2020   IR RADIOLOGIST EVAL & MGMT  07/18/2020   IR US GUIDE VASC ACCESS RIGHT  04/20/2020   IR US GUIDE VASC ACCESS RIGHT  06/21/2020   RADIOLOGY WITH ANESTHESIA N/A 04/20/2020   Procedure: IR WITH ANESTHESIA;  Surgeon: Luanne Bras, MD;  Location: [facility];  Service: Radiology;  Laterality: N/A;   RADIOLOGY WITH ANESTHESIA N/A 06/21/2020   Procedure: RADIOLOGY WITH ANESTHESIA  STENTING;  Surgeon: Corrie Mckusick, DO;  Location: [facility];  Service: Anesthesiology;  Laterality: N/A;     There were no vitals filed for this visit.   Subjective Assessment - 08/30/20 1407     Subjective "Now I can't swallow as good - since I've been out of the hospital."    Currently in Pain? Yes    Pain Score 5     Pain Location Teeth    Pain Orientation Right    Pain Descriptors / Indicators Sharp;Sore    Pain Type Acute pain    Pain Onset In the past 7 days                SLP Evaluation [facility] - 08/31/20 0001       SLP Visit Information   SLP Received On 08/30/20    Referring Provider (SLP) Danella Sensing, NP    Onset Date March 2022    Medical Diagnosis Rt MCA CVA      Pain Assessment   Currently in Pain? Yes    Pain Score 5     Pain Location Teeth    Pain Orientation Right    Pain Type Acute pain    Pain Onset In the past 7 days      Prior Functional Status   Cognitive/Linguistic Baseline Within functional limits    Type of Home Apartment  Lives With Significant other      Cognition   Overall Cognitive Status No family/caregiver present to determine baseline cognitive functioning   pt may require cognitive assessment during therapy course     Auditory Comprehension   Overall Auditory Comprehension Appears within functional limits for tasks assessed      Verbal Expression   Overall Verbal Expression Appears within functional limits for tasks assessed      Oral Motor/Sensory Function   Overall Oral Motor/Sensory Function Impaired at baseline    Labial ROM Reduced left    Labial Symmetry Abnormal symmetry left    Labial Strength Reduced Left    Labial Coordination Reduced    Lingual ROM Reduced left    Lingual Symmetry --   deviate rt   Lingual Strength Reduced    Overall Oral Motor/Sensory Function pt took sips thin today without overt s/sx aspiration however stated he coughs with liquids and solids daily if he does not take small sips and bites, or swallows too quickly. A MBSS is recommended      Motor Speech   Overall Motor Speech Impaired at  baseline    Respiration Impaired    Level of Impairment Word    Phonation Low vocal intensity   mid 60s in conversation   Articulation Impaired    Level of Impairment Word    Intelligibility Intelligibility reduced    Conversation 75-100% accurate   90% with careful listening   Interfering Components Premorbid status   pt states he had mild dysarthria premorbidly but nobody asked him to repeat; now he is asked multiple times per day   Effective Techniques Increased vocal intensity;Over-articulate   Pt stated overarticulating now. Loud /a/ upper 70s-low 80s dB with cues. SLP asked pt use the same effort in 2 minutes simple conversation- with usual SLP min-mod cues pt maintained upper 60s dB. Pt req'd mod A usually from SLP for overarticulation                            SLP Education - 08/31/20 0852     Education Details loud /a/, HEP for loud /a/, rationale for suggesting MBSS    [name]    Methods Explanation;Demonstration;Verbal cues;Handout    Comprehension Verbalized understanding;Returned demonstration;Verbal cues required;Need further instruction              SLP Short Term Goals - 08/31/20 0855       SLP SHORT TERM GOAL #1   Title pt will perform dysarthria HEP with occasional min A over 3 sessions    Baseline not provided yet    Time 4    Period Weeks    Status New    Target Date 09/28/20      SLP SHORT TERM GOAL #2   Title pt will perform any dysphagia HEP (post MBSS/FEES) with occasional min A x3 sessions    Baseline not provided yet    Time 4    Period Weeks    Status New    Target Date 09/28/20      SLP SHORT TERM GOAL #3   Title Bobby Branch will follow any precautions from MBSS/FEES with modified independence x3 sessions    Baseline not provided yet    Time 4    Period Weeks    Status New    Target Date 09/28/20      SLP SHORT TERM GOAL #4   Title pt will demo abdominal  breathing at rest 80% of the time x2 sessions     Baseline 0%    Time 4    Period Weeks    Status New    Target Date 09/28/20      SLP SHORT TERM GOAL #5   Title pt will demo volume in upper 60s with 18/20 sentence responses in 2 sessions    Baseline low-mid 60s    Time 4    Period Weeks    Status New    Target Date 09/28/20      Additional Short Term Goals   Additional Short Term Goals Yes      SLP SHORT TERM GOAL #6   Title pt will complete patient related outcome measure    Baseline not provided yet    Time 1    Period Weeks   or 2 therapy visits   Status New    Target Date 09/14/20      SLP SHORT TERM GOAL #7   Title pt will complete objective swallow eval (MBSS or FEES)    Baseline requested from referring MD    Time 4    Period Weeks    Status New    Target Date 09/28/20              SLP Long Term Goals - 08/31/20 0904       SLP LONG TERM GOAL #1   Title demo swallow HEP rare min A x3 sessions    Baseline not provided yet    Time 8    Period Weeks    Status New    Target Date 10/26/20      SLP LONG TERM GOAL #2   Title demo dysarthria (oral motor strengthing) HEP with rare min A x3 sessions    Baseline not provided yet    Time 8    Period Weeks    Status New    Target Date 10/26/20      SLP LONG TERM GOAL #3   Title follow swallow precautions from MBSS/FEES with rare min A in 3 sessions    Baseline not provided yet    Time 12    Period Weeks    Status New    Target Date 11/28/20      SLP LONG TERM GOAL #4   Title pt will exhibit abdominal breathing 75% of the time in 5 minutes simple conversation x3 sessoins    Baseline 0%    Time 12    Period Weeks    Status New    Target Date 11/28/20      SLP LONG TERM GOAL #5   Title pt will demo upper 60s dB volume in 10 minutes simple conversation in 3 sessions    Baseline low 60s dB    Time 12    Period Weeks    Status New    Target Date 11/28/20      Additional Long Term Goals   Additional Long Term Goals Yes      SLP LONG TERM GOAL  #6   Title pt will improve his patient-related outcome score    Baseline to be provided session #1    Time 12    Period Weeks    Status New    Target Date 11/28/20              Plan - 08/31/20 0854     Clinical Impression Statement "Bobby Branch" presents today with moderate dysarthria and with reported dysphagia with solids  and liquids. A MBSS was recommended. He will benefit from skilled ST targeting improved speech intelligibility and swallowing safety (swallow precautions and swallowing HEP).    Treatment/Interventions Aspiration precaution training;Pharyngeal strengthening exercises;Diet toleration management by SLP;Oral motor exercises;Compensatory techniques;Internal/external aids;Patient/family education;SLP instruction and feedback;Cognitive reorganization;Cueing hierarchy;Trials of upgraded texture/liquids;Other (comment)   objective swallow assessment (MBSS, FEES)   Potential to Achieve Goals Fair    Potential Considerations Previous level of function;Cooperation/participation level    SLP Home Exercise Plan loud /a/    Consulted and Agree with Plan of Care Patient             Patient will benefit from skilled therapeutic intervention in order to improve the following deficits and impairments:   Dysarthria and anarthria - Plan: SLP plan of care cert/re-cert  Dysphagia, unspecified type - Plan: SLP modified barium swallow, SLP plan of care cert/re-cert  Cognitive communication deficit - Plan: SLP plan of care cert/re-cert  Managed medicaid CPT codes: [phone] - SLP treatment, [phone] - Swallowing treatment, [phone] - Cognitive training (First 15 min), and [id number] - Cognitive training (each additional 15 min)   Problem List Patient Active Problem List   Diagnosis Date Noted   Internal carotid artery stenosis, bilateral 06/21/2020   Slow transit constipation    Spastic hemiplegia affecting nondominant side (HCC)    Stage 3b chronic kidney disease ([facility])    Essential hypertension     Controlled type 2 diabetes mellitus with hyperglycemia, without long-term current use of insulin ([place] [facility])    Protein-calorie malnutrition, severe 04/25/2020   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 ,[place], [facility]  08/31/2020, 9:22 AM  [facility] [address] [place], [place], [address] Phone: [phone]   Fax:  [phone]  Name: Bobby Branch MRN: [id number] Date of Birth: September 19, 1960

## 2020-09-04 ENCOUNTER — Ambulatory Visit: Payer: Federal, State, Local not specified - PPO | Admitting: Occupational Therapy

## 2020-09-04 ENCOUNTER — Ambulatory Visit: Payer: Federal, State, Local not specified - PPO | Attending: Registered Nurse | Admitting: Physical Therapy

## 2020-09-04 ENCOUNTER — Other Ambulatory Visit: Payer: Self-pay

## 2020-09-04 DIAGNOSIS — R1310 Dysphagia, unspecified: Secondary | ICD-10-CM | POA: Insufficient documentation

## 2020-09-04 DIAGNOSIS — R471 Dysarthria and anarthria: Secondary | ICD-10-CM | POA: Insufficient documentation

## 2020-09-04 DIAGNOSIS — R41841 Cognitive communication deficit: Secondary | ICD-10-CM | POA: Insufficient documentation

## 2020-09-05 ENCOUNTER — Ambulatory Visit: Payer: Federal, State, Local not specified - PPO | Admitting: Occupational Therapy

## 2020-09-05 ENCOUNTER — Ambulatory Visit: Payer: Federal, State, Local not specified - PPO

## 2020-09-07 ENCOUNTER — Other Ambulatory Visit (HOSPITAL_COMMUNITY): Payer: Self-pay

## 2020-09-07 ENCOUNTER — Telehealth (HOSPITAL_COMMUNITY): Payer: Self-pay

## 2020-09-07 DIAGNOSIS — R1310 Dysphagia, unspecified: Secondary | ICD-10-CM

## 2020-09-07 NOTE — Telephone Encounter (Signed)
2nd attempt to contact patient to schedule OP MBS - vm is full. HW

## 2020-09-10 ENCOUNTER — Ambulatory Visit: Payer: Federal, State, Local not specified - PPO

## 2020-09-11 ENCOUNTER — Ambulatory Visit (HOSPITAL_COMMUNITY)
Admission: RE | Admit: 2020-09-11 | Discharge: 2020-09-11 | Disposition: A | Discharge status: Home / Self Care | Payer: Federal, State, Local not specified - PPO | Source: Ambulatory Visit | Attending: Registered Nurse | Admitting: Registered Nurse

## 2020-09-11 ENCOUNTER — Other Ambulatory Visit: Payer: Self-pay

## 2020-09-11 DIAGNOSIS — R1310 Dysphagia, unspecified: Secondary | ICD-10-CM

## 2020-09-11 NOTE — Progress Notes (Signed)
Modified Barium Swallow Progress Note  Patient Details  Name: Bobby Branch MRN: [id number] Date of Birth: December 18, 1960  Today's Date: 09/11/2020  Modified Barium Swallow completed.  Full report located under Chart Review in the Imaging Section.  Brief recommendations include the following:  Clinical Impression  Pt presents with functional swallow c/b mastication and oral preparation of bolus on R side of oral cavity due to L sided oral motor deficits, however this does not reduce quality of bolus cohesion or A-P transit. Pharyngeal swallow is triggered largely at the vallecula for solids and pyrifrom sinuses for thin liquids, which is considered [facility] as timing of swallow trigger is not impaired and laryngeal vestibule closure is strong. Some occasional flash penetration (PAS 2) was observed when challenged with consecutive straw sips of thin liquids, with no aspiration noted. This is also considered to be [facility]. Pill with thin hesistated in cervical esophagus and ultimately passed with puree wash. Recommend regular/thin liquid diet with universal swallow precautions. Pt reports that he often has concern for dry foods "getting stuck" when swallowing. Educated in regards to alternation of solids with liquids/purees, slow rate of intake and moistening dry foods. All further f/u to be addressed with OP SLP.   Swallow Evaluation Recommendations       SLP Diet Recommendations: Regular solids;Thin liquid   Liquid Administration via: Cup;Straw   Medication Administration: Whole meds with liquid   Supervision: Patient able to self feed   Compensations: Slow rate;Small sips/bites;Follow solids with liquid   Postural Changes: Seated upright at 90 degrees   Oral Care Recommendations: Oral care BID      [place], [place], [place] Office Number: [phone]   Acie Fredrickson 09/11/2020,12:43 PM

## 2020-09-12 ENCOUNTER — Other Ambulatory Visit: Payer: Self-pay

## 2020-09-12 ENCOUNTER — Ambulatory Visit: Payer: Federal, State, Local not specified - PPO

## 2020-09-12 DIAGNOSIS — R471 Dysarthria and anarthria: Secondary | ICD-10-CM

## 2020-09-12 DIAGNOSIS — R1310 Dysphagia, unspecified: Secondary | ICD-10-CM

## 2020-09-12 DIAGNOSIS — R41841 Cognitive communication deficit: Secondary | ICD-10-CM | POA: Diagnosis present

## 2020-09-12 NOTE — Therapy (Signed)
[facility] [address] [place], [place], [address] Phone: [phone]   Fax:  [phone]  Speech Language Pathology Treatment  Patient Details  Name: Bobby Branch MRN: [id number] Date of Birth: 12-07-1960 Referring Provider (SLP): Danella Sensing, NP   Encounter Date: 09/12/2020   End of Session - 09/12/20 1516     Visit Number 2    Number of Visits 25    Date for SLP Re-Evaluation 11/28/20    Authorization Type BCBS and Medicaid    Authorization - Visit Number 1    Authorization - Number of Visits 5    SLP Start Time [id number]    SLP Stop Time  1440    SLP Time Calculation (min) 33 min    Activity Tolerance Patient tolerated treatment well             Past Medical History:  Diagnosis Date   Anxiety    Depression    Diabetes mellitus without complication ([facility])    type 2   Dyspnea    inhaler   GERD (gastroesophageal reflux disease)    HLD (hyperlipidemia)    Hypertension    Stroke ([facility]) 09/2019    Past Surgical History:  Procedure Laterality Date   EYE SURGERY Left    IR ANGIO EXTRACRAN SEL COM CAROTID INNOMINATE UNI L MOD SED  04/20/2020   IR ANGIO INTRA EXTRACRAN SEL COM CAROTID INNOMINATE UNI L MOD SED  06/21/2020   IR ANGIO VERTEBRAL SEL SUBCLAVIAN INNOMINATE UNI R MOD SED  06/21/2020   IR CT HEAD LTD  04/20/2020   IR INTRAVSC STENT CERV CAROTID W/EMB-PROT MOD SED INCL ANGIO  06/21/2020   IR PERCUTANEOUS ART THROMBECTOMY/INFUSION INTRACRANIAL INC DIAG ANGIO  04/20/2020   IR RADIOLOGIST EVAL & MGMT  06/05/2020   IR RADIOLOGIST EVAL & MGMT  07/18/2020   IR US GUIDE VASC ACCESS RIGHT  04/20/2020   IR US GUIDE VASC ACCESS RIGHT  06/21/2020   RADIOLOGY WITH ANESTHESIA N/A 04/20/2020   Procedure: IR WITH ANESTHESIA;  Surgeon: Luanne Bras, MD;  Location: [facility];  Service: Radiology;  Laterality: N/A;   RADIOLOGY WITH ANESTHESIA N/A 06/21/2020   Procedure: RADIOLOGY WITH ANESTHESIA  STENTING;  Surgeon: Corrie Mckusick, DO;  Location: [facility];  Service: Anesthesiology;  Laterality: N/A;    There were no vitals filed for this visit.   Subjective Assessment - 09/12/20 1409     Subjective "Last week I osunded like I was almost drunk." Pt endorses    Currently in Pain? No/denies    Pain Score 1     Pain Location Arm    Pain Orientation Left                   ADULT SLP TREATMENT - 09/12/20 1416       General Information   Behavior/Cognition Alert;Cooperative;Pleasant mood;Decreased sustained attention      Treatment Provided   Treatment provided Dysphagia;Cognitive-Linquistic      Dysphagia Treatment   Temperature Spikes Noted No    Treatment Methods Compensation strategy training;Patient/caregiver education    Other treatment/comments Modified (MBSS) showed WFL/WNL swallow, howeer universal dysphagia precautions were suggested and emphasized when pt stated drier more dense foods cause him issues. SLP reiterated small bites/sips, and slow rate, and with drier dense foods pt should alternate solid/liquid.      Cognitive-Linquistic Treatment   Treatment focused on Dysarthria    Skilled Treatment SLP reviewed pt's HEP with him - he lost  first copy of sentences so SLP provided him with second set. Pt's loud /a/ was average low to mid 70s initially and SLP cued pt for louder production wiht a demonstration. After demonstration pt's productions improved to upper 70s dB. Pt read sentences with low-mid 70s dB volume. SLP strongly encouraged pt to do HEP including loud /a/ and reading sentences, each day, BID.      Assessment / Recommendations / Plan   Plan Continue with current plan of care      Progression Toward Goals   Progression toward goals Progressing toward goals              SLP Education - 09/12/20 1516     Education Details need to do HEP BID, loud /a/, strong speech from diaphragm    [name]    Methods Explanation;Demonstration;Verbal cues;Handout     Comprehension Verbalized understanding;Returned demonstration;Verbal cues required;Need further instruction              SLP Short Term Goals - 09/12/20 1524       SLP SHORT TERM GOAL #1   Title pt will perform dysarthria HEP with occasional min A over 3 sessions    Baseline not provided yet    Time 4    Period Weeks    Status On-going    Target Date 09/28/20      SLP SHORT TERM GOAL #2   Title pt will perform any dysphagia HEP (post MBSS/FEES) with occasional min A x3 sessions    Baseline not provided yet    Time 4    Period Weeks    Status On-going    Target Date 09/28/20      SLP SHORT TERM GOAL #3   Title Joe will follow any precautions from MBSS/FEES with modified independence x3 sessions    Baseline not provided yet    Time 4    Period Weeks    Status On-going    Target Date 09/28/20      SLP SHORT TERM GOAL #4   Title pt will demo abdominal breathing at rest 80% of the time x2 sessions    Baseline 0%    Time 4    Period Weeks    Status On-going    Target Date 09/28/20      SLP SHORT TERM GOAL #5   Title pt will demo volume in upper 60s with 18/20 sentence responses in 2 sessions    Baseline low-mid 60s    Time 4    Period Weeks    Status On-going    Target Date 09/28/20      SLP SHORT TERM GOAL #6   Title pt will complete patient related outcome measure    Baseline not provided yet    Time 1    Period Weeks   or 2 therapy visits   Status On-going    Target Date 09/14/20      SLP SHORT TERM GOAL #7   Title pt will complete objective swallow eval (MBSS or FEES)    Baseline requested from referring MD    Status Achieved    Target Date 09/28/20              SLP Long Term Goals - 09/12/20 1524       SLP LONG TERM GOAL #1   Title demo swallow HEP rare min A x3 sessions9-23-    Baseline not provided yet    Time 8    Period Weeks  Status On-going    Target Date 10/26/20      SLP LONG TERM GOAL #2   Title demo dysarthria (oral motor  strengthing) HEP with rare min A x3 sessions    Baseline not provided yet    Time 8    Period Weeks    Status On-going    Target Date 10/26/20      SLP LONG TERM GOAL #3   Title follow swallow precautions from MBSS/FEES with rare min A in 3 sessions    Baseline not provided yet    Time 12    Period Weeks    Status On-going    Target Date 11/28/20      SLP LONG TERM GOAL #4   Title pt will exhibit abdominal breathing 75% of the time in 5 minutes simple conversation x3 sessoins    Baseline 0%    Time 12    Period Weeks    Status On-going    Target Date 11/28/20      SLP LONG TERM GOAL #5   Title pt will demo upper 60s dB volume in 10 minutes simple conversation in 3 sessions    Baseline low 60s dB    Time 12    Period Weeks    Status On-going      SLP LONG TERM GOAL #6   Title pt will improve his patient-related outcome score    Baseline to be provided session #1    Time 12    Period Weeks    Status New    Target Date 11/28/20              Plan - 09/12/20 1517     Clinical Impression Statement "Joe" presents today with moderate dysarthria and with reported dysphagia with solids and liquids. A MBSS was completed with WFL/WNL results. He will benefit from skilled ST targeting improved speech intelligibility and swallowing safety (swallow precautions and swallowing HEP).    Speech Therapy Frequency 2x / week    Duration 8 weeks    Treatment/Interventions Aspiration precaution training;Pharyngeal strengthening exercises;Diet toleration management by SLP;Oral motor exercises;Compensatory techniques;Internal/external aids;Patient/family education;SLP instruction and feedback;Cognitive reorganization;Cueing hierarchy;Trials of upgraded texture/liquids;Other (comment)   objective swallow assessment (MBSS, FEES)   Potential to Achieve Goals Fair    Potential Considerations Previous level of function;Cooperation/participation level    SLP Home Exercise Plan loud /a/     Consulted and Agree with Plan of Care Patient             Patient will benefit from skilled therapeutic intervention in order to improve the following deficits and impairments:   Dysphagia, unspecified type  Cognitive communication deficit  Dysarthria and anarthria    Problem List Patient Active Problem List   Diagnosis Date Noted   Internal carotid artery stenosis, bilateral 06/21/2020   Slow transit constipation    Spastic hemiplegia affecting nondominant side (HCC)    Stage 3b chronic kidney disease ([place])    Essential hypertension    Controlled type 2 diabetes mellitus with hyperglycemia, without long-term current use of insulin ([place])    Protein-calorie malnutrition, severe 04/25/2020   Right middle cerebral artery stroke ([place]) 04/23/2020   Acute right MCA stroke ([facility] [place]) 04/20/2020    [facility]. ,[place], [place]  09/12/2020, 3:25 PM  [facility] [address] [place], [place], [address] Phone: [phone]   Fax:  [phone]   Name: [place] MRN: [id number] Date of Birth: 1960/06/07

## 2020-09-18 ENCOUNTER — Ambulatory Visit: Payer: Federal, State, Local not specified - PPO | Admitting: Occupational Therapy

## 2020-09-18 ENCOUNTER — Ambulatory Visit: Payer: Federal, State, Local not specified - PPO

## 2020-09-19 ENCOUNTER — Ambulatory Visit: Payer: Federal, State, Local not specified - PPO

## 2020-09-20 ENCOUNTER — Encounter
Payer: Federal, State, Local not specified - PPO | Attending: Registered Nurse | Admitting: Physical Medicine & Rehabilitation

## 2020-09-20 ENCOUNTER — Telehealth: Payer: Self-pay

## 2020-09-20 DIAGNOSIS — M7502 Adhesive capsulitis of left shoulder: Secondary | ICD-10-CM | POA: Insufficient documentation

## 2020-09-20 DIAGNOSIS — G8110 Spastic hemiplegia affecting unspecified side: Secondary | ICD-10-CM | POA: Insufficient documentation

## 2020-09-20 NOTE — Telephone Encounter (Signed)
Call placed to patient to inquire if he has been evaluated for PCS.  Voicemail full, unable to leave a message.

## 2020-09-21 ENCOUNTER — Ambulatory Visit: Payer: Federal, State, Local not specified - PPO

## 2020-09-21 ENCOUNTER — Encounter: Payer: Federal, State, Local not specified - PPO | Admitting: Vascular Surgery

## 2020-09-25 ENCOUNTER — Other Ambulatory Visit: Payer: Self-pay

## 2020-09-25 ENCOUNTER — Ambulatory Visit: Payer: Federal, State, Local not specified - PPO

## 2020-09-25 DIAGNOSIS — R471 Dysarthria and anarthria: Secondary | ICD-10-CM

## 2020-09-25 NOTE — Therapy (Signed)
[facility] [address] [place], [place], [address] Phone: [phone]   Fax:  [phone]  Speech Language Pathology Treatment  Patient Details  Name: Bobby Branch MRN: [id number] Date of Birth: [birth year] Referring Provider (SLP): Danella Sensing, NP   Encounter Date: 09/25/2020   End of Session - 09/25/20 1324     Visit Number 3    Number of Visits 25    Date for SLP Re-Evaluation 11/28/20    Authorization Type BCBS and Medicaid    Authorization - Visit Number 2    Authorization - Number of Visits 5    SLP Start Time [id number]    SLP Stop Time  1358    SLP Time Calculation (min) 40 min    Activity Tolerance Patient tolerated treatment well             Past Medical History:  Diagnosis Date   Anxiety    Depression    Diabetes mellitus without complication ([facility])    type 2   Dyspnea    inhaler   GERD (gastroesophageal reflux disease)    HLD (hyperlipidemia)    Hypertension    Stroke ([facility]) 09/2019    Past Surgical History:  Procedure Laterality Date   EYE SURGERY Left    IR ANGIO EXTRACRAN SEL COM CAROTID INNOMINATE UNI L MOD SED  04/20/2020   IR ANGIO INTRA EXTRACRAN SEL COM CAROTID INNOMINATE UNI L MOD SED  06/21/2020   IR ANGIO VERTEBRAL SEL SUBCLAVIAN INNOMINATE UNI R MOD SED  06/21/2020   IR CT HEAD LTD  04/20/2020   IR INTRAVSC STENT CERV CAROTID W/EMB-PROT MOD SED INCL ANGIO  06/21/2020   IR PERCUTANEOUS ART THROMBECTOMY/INFUSION INTRACRANIAL INC DIAG ANGIO  04/20/2020   IR RADIOLOGIST EVAL & MGMT  06/05/2020   IR RADIOLOGIST EVAL & MGMT  07/18/2020   IR US GUIDE VASC ACCESS RIGHT  04/20/2020   IR US GUIDE VASC ACCESS RIGHT  06/21/2020   RADIOLOGY WITH ANESTHESIA N/A 04/20/2020   Procedure: IR WITH ANESTHESIA;  Surgeon: Luanne Bras, MD;  Location: [facility];  Service: Radiology;  Laterality: N/A;   RADIOLOGY WITH ANESTHESIA N/A 06/21/2020   Procedure: RADIOLOGY WITH ANESTHESIA  STENTING;  Surgeon: Corrie Mckusick, DO;  Location: [facility];  Service: Anesthesiology;  Laterality: N/A;    There were no vitals filed for this visit.   Subjective Assessment - 09/25/20 1320     Subjective "I missed a bunch of appointments last week"    Currently in Pain? Yes    Pain Score 4     Pain Location Shoulder    Pain Orientation Right                   ADULT SLP TREATMENT - 09/25/20 1321       General Information   Behavior/Cognition Alert;Cooperative;Pleasant mood;Decreased sustained attention;Distractible      Treatment Provided   Treatment provided Cognitive-Linquistic      Cognitive-Linquistic Treatment   Treatment focused on Dysarthria    Skilled Treatment Pt reported moving last week so he missed appointments. Pt has been completing HEP "not much" due to being busy. Loud /a/ completed, in which pt averaged low to mid 80s DB with occasional cues for breath support and loudness (range: 79-86 db). SLP targeted reading aloud sentences, which volume ranged from high 60s to low 70s DB. For Q and A and in conversation, loudness averaged mid 60s, which required usual cues to increase volume and effort.  Volume improved to high 60s to low 70s db with cues. Pt attributed reduced volume related to fatigue.      Assessment / Recommendations / Plan   Plan Continue with current plan of care      Progression Toward Goals   Progression toward goals Progressing toward goals              SLP Education - 09/25/20 1335     Education Details HEP BID, volume is consistency    [name]    Methods Explanation;Demonstration    Comprehension Verbalized understanding;Returned demonstration;Verbal cues required              SLP Short Term Goals - 09/25/20 1333       SLP SHORT TERM GOAL #1   Title pt will perform dysarthria HEP with occasional min A over 3 sessions    Baseline not provided yet; HEP initiated    Time 4    Period Weeks    Status On-going    Target Date  09/28/20      SLP SHORT TERM GOAL #2   Title pt will perform any dysphagia HEP (post MBSS/FEES) with occasional min A x3 sessions    Baseline not provided yet    Time 4    Period Weeks    Status On-going    Target Date 09/28/20      SLP SHORT TERM GOAL #3   Title Bobby Branch will follow any precautions from MBSS/FEES with modified independence x3 sessions    Baseline not provided yet    Time 4    Period Weeks    Status On-going    Target Date 09/28/20      SLP SHORT TERM GOAL #4   Title pt will demo abdominal breathing at rest 80% of the time x2 sessions    Baseline 0%    Time 4    Period Weeks    Status On-going    Target Date 09/28/20      SLP SHORT TERM GOAL #5   Title pt will demo volume in upper 60s with 18/20 sentence responses in 2 sessions    Baseline low-mid 60s; 09-25-20    Time 4    Period Weeks    Status On-going    Target Date 09/28/20      SLP SHORT TERM GOAL #6   Title pt will complete patient related outcome measure    Baseline not provided yet    Time 1    Period Weeks   or 2 therapy visits   Status On-going    Target Date 09/14/20      SLP SHORT TERM GOAL #7   Title pt will complete objective swallow eval (MBSS or FEES)    Baseline requested from referring MD    Status Achieved    Target Date 09/28/20              SLP Long Term Goals - 09/25/20 1334       SLP LONG TERM GOAL #1   Title demo swallow HEP rare min A x3 sessions9-23-    Baseline not provided yet    Time 8    Period Weeks    Status On-going    Target Date 10/26/20      SLP LONG TERM GOAL #2   Title demo dysarthria (oral motor strengthing) HEP with rare min A x3 sessions    Baseline not provided yet    Time 8    Period Weeks  Status On-going    Target Date 10/26/20      SLP LONG TERM GOAL #3   Title follow swallow precautions from MBSS/FEES with rare min A in 3 sessions    Baseline not provided yet    Time 12    Period Weeks    Status On-going    Target Date 11/28/20       SLP LONG TERM GOAL #4   Title pt will exhibit abdominal breathing 75% of the time in 5 minutes simple conversation x3 sessoins    Baseline 0%    Time 12    Period Weeks    Status On-going    Target Date 11/28/20      SLP LONG TERM GOAL #5   Title pt will demo upper 60s dB volume in 10 minutes simple conversation in 3 sessions    Baseline low 60s dB    Time 12    Period Weeks    Status On-going    Target Date 11/28/20      SLP LONG TERM GOAL #6   Title pt will improve his patient-related outcome score    Baseline to be provided session #1    Time 12    Period Weeks    Status New    Target Date 11/28/20              Plan - 09/25/20 1324     Clinical Impression Statement "Bobby Branch" presents today with moderate dysarthria and with reported dysphagia with solids and liquids. A MBSS was completed with WFL/WNL results. Ongoing education and training of dysarthria compensations conducted, with occasional to usual min A required to optimize breath support and increase volume on structured tasks and in conversation . He will benefit from skilled ST targeting improved speech intelligibility and swallowing safety (swallow precautions and swallowing HEP).    Speech Therapy Frequency 2x / week    Duration 8 weeks    Treatment/Interventions Aspiration precaution training;Pharyngeal strengthening exercises;Diet toleration management by SLP;Oral motor exercises;Compensatory techniques;Internal/external aids;Patient/family education;SLP instruction and feedback;Cognitive reorganization;Cueing hierarchy;Trials of upgraded texture/liquids;Other (comment)    Potential to Achieve Goals Fair    Potential Considerations Previous level of function;Cooperation/participation level    SLP Home Exercise Plan loud /a/ and sentences    Consulted and Agree with Plan of Care Patient             Patient will benefit from skilled therapeutic intervention in order to improve the following deficits and  impairments:   Dysarthria and anarthria    Problem List Patient Active Problem List   Diagnosis Date Noted   Internal carotid artery stenosis, bilateral 06/21/2020   Slow transit constipation    Spastic hemiplegia affecting nondominant side ([facility])    Stage 3b chronic kidney disease ([place])    Essential hypertension    Controlled type 2 diabetes mellitus with hyperglycemia, without long-term current use of insulin ([place])    Protein-calorie malnutrition, severe 04/25/2020   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

## 2020-09-27 ENCOUNTER — Other Ambulatory Visit: Payer: Self-pay

## 2020-09-27 ENCOUNTER — Ambulatory Visit: Payer: Federal, State, Local not specified - PPO

## 2020-09-27 DIAGNOSIS — R471 Dysarthria and anarthria: Secondary | ICD-10-CM

## 2020-09-27 NOTE — Therapy (Signed)
[facility] [address] [place], [place], [address] Phone: [phone]   Fax:  [phone]  Speech Language Pathology Treatment  Patient Details  Name: Bobby Branch MRN: [id number] Date of Birth: 1960-05-08 Referring Provider (SLP): Bobby Sensing, NP   Encounter Date: 09/27/2020   End of Session - 09/27/20 1517     Visit Number 4    Number of Visits 25    Date for SLP Re-Evaluation 11/28/20    Authorization Type BCBS and Medicaid    Authorization - Visit Number 3    Authorization - Number of Visits 5    SLP Start Time [id number]    SLP Stop Time  8127    SLP Time Calculation (min) 41 min    Activity Tolerance Patient tolerated treatment well             Past Medical History:  Diagnosis Date   Anxiety    Depression    Diabetes mellitus without complication ([facility])    type 2   Dyspnea    inhaler   GERD (gastroesophageal reflux disease)    HLD (hyperlipidemia)    Hypertension    Stroke ([facility]) 09/2019    Past Surgical History:  Procedure Laterality Date   EYE SURGERY Left    IR ANGIO EXTRACRAN SEL COM CAROTID INNOMINATE UNI L MOD SED  04/20/2020   IR ANGIO INTRA EXTRACRAN SEL COM CAROTID INNOMINATE UNI L MOD SED  06/21/2020   IR ANGIO VERTEBRAL SEL SUBCLAVIAN INNOMINATE UNI R MOD SED  06/21/2020   IR CT HEAD LTD  04/20/2020   IR INTRAVSC STENT CERV CAROTID W/EMB-PROT MOD SED INCL ANGIO  06/21/2020   IR PERCUTANEOUS ART THROMBECTOMY/INFUSION INTRACRANIAL INC DIAG ANGIO  04/20/2020   IR RADIOLOGIST EVAL & MGMT  06/05/2020   IR RADIOLOGIST EVAL & MGMT  07/18/2020   IR US GUIDE VASC ACCESS RIGHT  04/20/2020   IR US GUIDE VASC ACCESS RIGHT  06/21/2020   RADIOLOGY WITH ANESTHESIA N/A 04/20/2020   Procedure: IR WITH ANESTHESIA;  Surgeon: Luanne Bras, MD;  Location: [facility];  Service: Radiology;  Laterality: N/A;   RADIOLOGY WITH ANESTHESIA N/A 06/21/2020   Procedure: RADIOLOGY WITH ANESTHESIA  STENTING;  Surgeon: Corrie Mckusick, DO;  Location: [facility];  Service: Anesthesiology;  Laterality: N/A;    There were no vitals filed for this visit.   Subjective Assessment - 09/27/20 1409     Subjective "I've been practicing."    Currently in Pain? Yes    Pain Score 4     Pain Location Shoulder    Pain Orientation Right    Pain Descriptors / Indicators Sore;Sharp    Pain Type Acute pain    Pain Onset 1 to 4 weeks ago                   ADULT SLP TREATMENT - 09/27/20 1409       General Information   Behavior/Cognition Alert;Cooperative;Pleasant mood      Treatment Provided   Treatment provided Cognitive-Linquistic      Cognitive-Linquistic Treatment   Treatment focused on Dysarthria    Skilled Treatment Pt reports he has been completing HEP. Branch loud /a/ completed with initial dues for loudness, in which pt average low to mid 80s dB with rare cues for breath support and loudness. SLP targeted 2 - 2 1/2 minute short conversational segments, which volume ranged high 60s -low 70s dB. Volume improved to high 60s to low 70s db  with occasional min cues. Pt attributed reduced volume later in session to fatigue.      Assessment / Recommendations / Plan   Plan Continue with current plan of care      Progression Toward Goals   Progression toward goals Progressing toward goals                SLP Short Term Goals - 09/27/20 1518       SLP SHORT TERM GOAL #1   Title pt will perform dysarthria HEP with occasional min A over 3 sessions    Baseline not provided yet; HEP initiated;   09-27-20    Time --    Period --    Status Partially Met    Target Date 09/28/20      SLP SHORT TERM GOAL #2   Title pt will perform any dysphagia HEP (post MBSS/FEES) with occasional min A x3 sessions    Baseline not provided yet    Time --    Period --    Status Deferred    Target Date 09/28/20      SLP SHORT TERM GOAL #3   Title Bobby Branch will follow any precautions from MBSS/FEES with modified independence x3  sessions    Baseline not provided yet    Time --    Period --    Status Deferred    Target Date 09/28/20      SLP SHORT TERM GOAL #4   Title pt will demo abdominal breathing at rest 80% of the time x2 sessions    Baseline 0%    Time --    Period --    Status Not Met    Target Date 09/28/20      SLP SHORT TERM GOAL #5   Title pt will demo volume in upper 60s with 18/20 sentence responses in 2 sessions    Baseline low-mid 60s; 09-25-20    Time --    Period --    Status Achieved    Target Date 09/28/20      SLP SHORT TERM GOAL #6   Title pt will complete patient related outcome measure    Baseline not provided yet    Time --    Period --   or 2 therapy visits   Status Not Met    Target Date 09/14/20      SLP SHORT TERM GOAL #7   Title pt will complete objective swallow eval (MBSS or FEES)    Baseline requested from referring MD    Status Achieved    Target Date 09/28/20              SLP Long Term Goals - 09/27/20 1522       SLP LONG TERM GOAL #1   Title demo swallow HEP rare min A x3 sessions    Baseline not provided yet;   09-27-20    Time 8    Period Weeks    Status On-going    Target Date 10/26/20      SLP LONG TERM GOAL #2   Title demo dysarthria (oral motor strengthing) HEP with rare min A x3 sessions    Baseline not provided yet    Time 8    Period Weeks    Status On-going    Target Date 10/26/20      SLP LONG TERM GOAL #3   Title follow swallow precautions from MBSS/FEES with rare min A in 3 sessions    Baseline not provided yet  Time 12    Period Weeks    Status On-going    Target Date 11/28/20      SLP LONG TERM GOAL #4   Title pt will exhibit abdominal breathing 75% of the time in 5 minutes simple conversation x3 sessoins    Baseline 0%    Time 12    Period Weeks    Status On-going    Target Date 11/28/20      SLP LONG TERM GOAL #5   Title pt will demo upper 60s dB volume in 10 minutes simple conversation in 3 sessions    Baseline  low 60s dB    Time 12    Period Weeks    Status On-going    Target Date 11/28/20      SLP LONG TERM GOAL #6   Title pt will improve his patient-related outcome score    Baseline to be provided session #1    Time 12    Period Weeks    Status New    Target Date 11/28/20              Plan - 09/27/20 1517     Clinical Impression Statement "Bobby Branch" presents Branch with improving mild-moderate dysarthria. A MBSS was completed with WFL/WNL results. Ongoing education and training of dysarthria compensations conducted, with occasional to usual min A required to optimize breath support and increase volume on structured tasks and in conversation. He will benefit from skilled ST targeting improved speech intelligibility and swallowing safety (swallow precautions and swallowing HEP).    Speech Therapy Frequency 2x / week    Duration 8 weeks    Treatment/Interventions Aspiration precaution training;Pharyngeal strengthening exercises;Diet toleration management by SLP;Oral motor exercises;Compensatory techniques;Internal/external aids;Patient/family education;SLP instruction and feedback;Cognitive reorganization;Cueing hierarchy;Trials of upgraded texture/liquids;Other (comment)    Potential to Achieve Goals Fair    Potential Considerations Previous level of function;Cooperation/participation level    SLP Home Exercise Plan loud /a/ and sentences    Consulted and Agree with Plan of Care Patient             Patient will benefit from skilled therapeutic intervention in order to improve the following deficits and impairments:   Dysarthria and anarthria    Problem List Patient Active Problem List   Diagnosis Date Noted   Internal carotid artery stenosis, bilateral 06/21/2020   Slow transit constipation    Spastic hemiplegia affecting nondominant side ([facility])    Stage 3b chronic kidney disease ([facility])    Essential hypertension    Controlled type 2 diabetes mellitus with hyperglycemia, without  long-term current use of insulin ([facility])    Protein-calorie malnutrition, severe 04/25/2020   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

## 2020-10-01 ENCOUNTER — Other Ambulatory Visit: Payer: Self-pay | Admitting: Interventional Radiology

## 2020-10-01 DIAGNOSIS — I6522 Occlusion and stenosis of left carotid artery: Secondary | ICD-10-CM

## 2020-10-02 ENCOUNTER — Ambulatory Visit: Payer: Federal, State, Local not specified - PPO

## 2020-10-02 ENCOUNTER — Other Ambulatory Visit: Payer: Self-pay

## 2020-10-02 ENCOUNTER — Other Ambulatory Visit: Payer: Self-pay | Admitting: Physician Assistant

## 2020-10-02 DIAGNOSIS — R471 Dysarthria and anarthria: Secondary | ICD-10-CM | POA: Diagnosis not present

## 2020-10-02 DIAGNOSIS — R41841 Cognitive communication deficit: Secondary | ICD-10-CM

## 2020-10-02 MED ORDER — ALBUTEROL SULFATE HFA 108 (90 BASE) MCG/ACT IN AERS
1.0000 | INHALATION_SPRAY | Freq: Four times a day (QID) | RESPIRATORY_TRACT | 0 refills | Status: DC | PRN
  Filled 2020-10-02: qty 8.5, 25d supply, fill #0

## 2020-10-02 NOTE — Patient Instructions (Signed)
Continue to practice your slower enunciated speech!

## 2020-10-02 NOTE — Therapy (Signed)
[facility] [address] [place], [place], [address] Phone: [phone]   Fax:  [phone]  Speech Language Pathology Treatment  Patient Details  Name: Bobby Branch MRN: [id number] Date of Birth: 1960-12-13 Referring Provider (SLP): Danella Sensing, NP   Encounter Date: 10/02/2020   End of Session - 10/02/20 1528     Number of Visits 25    Date for SLP Re-Evaluation 11/28/20    Authorization Type BCBS and Medicaid    Authorization - Visit Number 3    Authorization - Number of Visits 5    Activity Tolerance Patient tolerated treatment well             Past Medical History:  Diagnosis Date   Anxiety    Depression    Diabetes mellitus without complication ([facility])    type 2   Dyspnea    inhaler   GERD (gastroesophageal reflux disease)    HLD (hyperlipidemia)    Hypertension    Stroke ([facility]) 09/2019    Past Surgical History:  Procedure Laterality Date   EYE SURGERY Left    IR ANGIO EXTRACRAN SEL COM CAROTID INNOMINATE UNI L MOD SED  04/20/2020   IR ANGIO INTRA EXTRACRAN SEL COM CAROTID INNOMINATE UNI L MOD SED  06/21/2020   IR ANGIO VERTEBRAL SEL SUBCLAVIAN INNOMINATE UNI R MOD SED  06/21/2020   IR CT HEAD LTD  04/20/2020   IR INTRAVSC STENT CERV CAROTID W/EMB-PROT MOD SED INCL ANGIO  06/21/2020   IR PERCUTANEOUS ART THROMBECTOMY/INFUSION INTRACRANIAL INC DIAG ANGIO  04/20/2020   IR RADIOLOGIST EVAL & MGMT  06/05/2020   IR RADIOLOGIST EVAL & MGMT  07/18/2020   IR US GUIDE VASC ACCESS RIGHT  04/20/2020   IR US GUIDE VASC ACCESS RIGHT  06/21/2020   RADIOLOGY WITH ANESTHESIA N/A 04/20/2020   Procedure: IR WITH ANESTHESIA;  Surgeon: Luanne Bras, MD;  Location: [facility];  Service: Radiology;  Laterality: N/A;   RADIOLOGY WITH ANESTHESIA N/A 06/21/2020   Procedure: RADIOLOGY WITH ANESTHESIA  STENTING;  Surgeon: Corrie Mckusick, DO;  Location: [facility];  Service: Anesthesiology;  Laterality: N/A;    There were no vitals  filed for this visit.   Subjective Assessment - 10/02/20 2351     Subjective "I can't drive with this arm, Bobby Branch."    Currently in Pain? Yes    Pain Score 4     Pain Location Shoulder    Pain Orientation Right    Pain Descriptors / Indicators Sore                   ADULT SLP TREATMENT - 10/02/20 2352       General Information   Behavior/Cognition Alert;Cooperative;Pleasant mood;Distractible      Treatment Provided   Treatment provided Cognitive-Linquistic      Cognitive-Linquistic Treatment   Treatment focused on Dysarthria    Skilled Treatment SLP targeted dysarthria in connected speech today, in short conversational segments of 5-7 minutes, pt maintained intelligibility 95% with some short rushes of speech which degraded intelligiblity slightly but SLP could cont to follow pt's message. Pt knows he has one ore ST session to be completed when Josem Kaufmann is rec'd from [place].      Assessment / Recommendations / Plan   Plan Continue with current plan of care      Progression Toward Goals   Progression toward goals Progressing toward goals  SLP Short Term Goals - 09/27/20 1518       SLP SHORT TERM GOAL #1   Title pt will perform dysarthria HEP with occasional min A over 3 sessions    Baseline not provided yet; HEP initiated;   09-27-20    Time --    Period --    Status Partially Met    Target Date 09/28/20      SLP SHORT TERM GOAL #2   Title pt will perform any dysphagia HEP (post MBSS/FEES) with occasional min A x3 sessions    Baseline not provided yet    Time --    Period --    Status Deferred    Target Date 09/28/20      SLP SHORT TERM GOAL #3   Title Bobby Branch will follow any precautions from MBSS/FEES with modified independence x3 sessions    Baseline not provided yet    Time --    Period --    Status Deferred    Target Date 09/28/20      SLP SHORT TERM GOAL #4   Title pt will demo abdominal breathing at rest 80% of the time x2  sessions    Baseline 0%    Time --    Period --    Status Not Met    Target Date 09/28/20      SLP SHORT TERM GOAL #5   Title pt will demo volume in upper 60s with 18/20 sentence responses in 2 sessions    Baseline low-mid 60s; 09-25-20    Time --    Period --    Status Achieved    Target Date 09/28/20      SLP SHORT TERM GOAL #6   Title pt will complete patient related outcome measure    Baseline not provided yet    Time --    Period --   or 2 therapy visits   Status Not Met    Target Date 09/14/20      SLP SHORT TERM GOAL #7   Title pt will complete objective swallow eval (MBSS or FEES)    Baseline requested from referring MD    Status Achieved    Target Date 09/28/20              SLP Long Term Goals - 10/02/20 2357       SLP LONG TERM GOAL #1   Title demo swallow HEP rare min A x3 sessions    Baseline not provided yet;   09-27-20    Time 8    Period Weeks    Status On-going    Target Date 10/26/20      SLP LONG TERM GOAL #2   Title demo dysarthria (oral motor strengthing) HEP with rare min A x3 sessions    Baseline not provided yet    Time 8    Period Weeks    Status On-going    Target Date 10/26/20      SLP LONG TERM GOAL #3   Title follow swallow precautions from MBSS/FEES with rare min A in 3 sessions    Baseline not provided yet    Time 12    Period Weeks    Status On-going    Target Date 11/28/20      SLP LONG TERM GOAL #4   Title pt will exhibit abdominal breathing 75% of the time in 5 minutes simple conversation x3 sessoins    Baseline 0%    Time 12  Period Weeks    Status On-going    Target Date 11/28/20      SLP LONG TERM GOAL #5   Title pt will demo upper 60s dB volume in 10 minutes simple conversation in 3 sessions    Baseline low 60s dB    Time 12    Period Weeks    Status On-going    Target Date 11/28/20      SLP LONG TERM GOAL #6   Title pt will improve his patient-related outcome score    Baseline to be provided session  #1    Time 12    Period Weeks    Status New              Plan - 10/02/20 2356     Clinical Impression Statement "Bobby Branch" presents today with improving mild-moderate dysarthria. A MBSS was completed with WFL/WNL results. Ongoing education and training of dysarthria compensations conducted, with min A required to optimize breath support and increase volume on structured tasks and in conversation, although improvement in overall articulation/intelligiblity was seen today. He will benefit from skilled ST targeting improved speech intelligibility and swallowing safety (swallow precautions and swallowing HEP).    Speech Therapy Frequency 2x / week    Duration 8 weeks    Treatment/Interventions Aspiration precaution training;Pharyngeal strengthening exercises;Diet toleration management by SLP;Oral motor exercises;Compensatory techniques;Internal/external aids;Patient/family education;SLP instruction and feedback;Cognitive reorganization;Cueing hierarchy;Trials of upgraded texture/liquids;Other (comment)    Potential to Achieve Goals Fair    Potential Considerations Previous level of function;Cooperation/participation level    SLP Home Exercise Plan loud /a/ and sentences    Consulted and Agree with Plan of Care Patient             Patient will benefit from skilled therapeutic intervention in order to improve the following deficits and impairments:   Dysarthria and anarthria  Cognitive communication deficit    Problem List Patient Active Problem List   Diagnosis Date Noted   Internal carotid artery stenosis, bilateral 06/21/2020   Slow transit constipation    Spastic hemiplegia affecting nondominant side ([facility])    Stage 3b chronic kidney disease ([facility])    Essential hypertension    Controlled type 2 diabetes mellitus with hyperglycemia, without long-term current use of insulin ([facility])    Protein-calorie malnutrition, severe 04/25/2020   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

## 2020-10-03 ENCOUNTER — Ambulatory Visit: Payer: Federal, State, Local not specified - PPO

## 2020-10-03 ENCOUNTER — Other Ambulatory Visit: Payer: Self-pay

## 2020-10-05 ENCOUNTER — Other Ambulatory Visit: Payer: Self-pay

## 2020-10-09 ENCOUNTER — Ambulatory Visit: Payer: Federal, State, Local not specified - PPO | Attending: Registered Nurse

## 2020-10-09 DIAGNOSIS — G8929 Other chronic pain: Secondary | ICD-10-CM | POA: Insufficient documentation

## 2020-10-09 DIAGNOSIS — M25512 Pain in left shoulder: Secondary | ICD-10-CM | POA: Insufficient documentation

## 2020-10-09 DIAGNOSIS — R471 Dysarthria and anarthria: Secondary | ICD-10-CM | POA: Insufficient documentation

## 2020-10-09 DIAGNOSIS — M25612 Stiffness of left shoulder, not elsewhere classified: Secondary | ICD-10-CM | POA: Insufficient documentation

## 2020-10-09 DIAGNOSIS — R41841 Cognitive communication deficit: Secondary | ICD-10-CM | POA: Insufficient documentation

## 2020-10-09 DIAGNOSIS — R1310 Dysphagia, unspecified: Secondary | ICD-10-CM | POA: Insufficient documentation

## 2020-10-09 DIAGNOSIS — M25642 Stiffness of left hand, not elsewhere classified: Secondary | ICD-10-CM | POA: Insufficient documentation

## 2020-10-09 DIAGNOSIS — I69351 Hemiplegia and hemiparesis following cerebral infarction affecting right dominant side: Secondary | ICD-10-CM | POA: Insufficient documentation

## 2020-10-09 DIAGNOSIS — R2681 Unsteadiness on feet: Secondary | ICD-10-CM | POA: Insufficient documentation

## 2020-10-09 DIAGNOSIS — I69354 Hemiplegia and hemiparesis following cerebral infarction affecting left non-dominant side: Secondary | ICD-10-CM | POA: Insufficient documentation

## 2020-10-09 DIAGNOSIS — R41840 Attention and concentration deficit: Secondary | ICD-10-CM | POA: Insufficient documentation

## 2020-10-10 ENCOUNTER — Ambulatory Visit: Payer: Federal, State, Local not specified - PPO

## 2020-10-11 NOTE — Telephone Encounter (Signed)
Attempted again to contact patient to inquire if he was evaluated for PCS.  The phone rings, no option to leave a message.  Patient has appointment with Dr Margarita Rana 10/16/2020, message entered into appointment notes to inquire about PCS.

## 2020-10-12 ENCOUNTER — Other Ambulatory Visit: Payer: Self-pay

## 2020-10-15 ENCOUNTER — Telehealth: Payer: Self-pay

## 2020-10-15 NOTE — Telephone Encounter (Signed)
Appt update

## 2020-10-16 ENCOUNTER — Ambulatory Visit: Payer: Federal, State, Local not specified - PPO | Attending: Family Medicine | Admitting: Family Medicine

## 2020-10-16 ENCOUNTER — Other Ambulatory Visit: Payer: Self-pay

## 2020-10-16 ENCOUNTER — Ambulatory Visit: Payer: Federal, State, Local not specified - PPO

## 2020-10-16 ENCOUNTER — Telehealth: Payer: Self-pay

## 2020-10-16 NOTE — Telephone Encounter (Signed)
Speech therapist (SLP) called pt to inquire about scheduling for ST as pt has no-showed his last two speech therapy appointments including today (total of 4 no-shows). Number in chart [phone]) is no longer active for pt - therefore SLP was unable to connect with or leave message for pt. His last scheduled ST appointment is next week.   Garald Balding, MS, CCC-SLP

## 2020-10-18 ENCOUNTER — Ambulatory Visit: Payer: Federal, State, Local not specified - PPO

## 2020-10-18 ENCOUNTER — Other Ambulatory Visit: Payer: Self-pay

## 2020-10-18 DIAGNOSIS — R2681 Unsteadiness on feet: Secondary | ICD-10-CM | POA: Diagnosis present

## 2020-10-18 DIAGNOSIS — I69351 Hemiplegia and hemiparesis following cerebral infarction affecting right dominant side: Secondary | ICD-10-CM | POA: Diagnosis present

## 2020-10-18 DIAGNOSIS — G8929 Other chronic pain: Secondary | ICD-10-CM | POA: Diagnosis present

## 2020-10-18 DIAGNOSIS — R41840 Attention and concentration deficit: Secondary | ICD-10-CM | POA: Diagnosis present

## 2020-10-18 DIAGNOSIS — I69354 Hemiplegia and hemiparesis following cerebral infarction affecting left non-dominant side: Secondary | ICD-10-CM | POA: Diagnosis present

## 2020-10-18 DIAGNOSIS — R471 Dysarthria and anarthria: Secondary | ICD-10-CM | POA: Diagnosis present

## 2020-10-18 DIAGNOSIS — M25512 Pain in left shoulder: Secondary | ICD-10-CM | POA: Diagnosis not present

## 2020-10-18 DIAGNOSIS — R1310 Dysphagia, unspecified: Secondary | ICD-10-CM | POA: Diagnosis present

## 2020-10-18 DIAGNOSIS — M25642 Stiffness of left hand, not elsewhere classified: Secondary | ICD-10-CM | POA: Diagnosis present

## 2020-10-18 DIAGNOSIS — R41841 Cognitive communication deficit: Secondary | ICD-10-CM | POA: Diagnosis present

## 2020-10-18 DIAGNOSIS — M25612 Stiffness of left shoulder, not elsewhere classified: Secondary | ICD-10-CM | POA: Diagnosis present

## 2020-10-18 NOTE — Therapy (Signed)
[facility] [address] [place], [place], [address] Phone: [phone]   Fax:  [phone]  Speech Language Pathology Treatment  Patient Details  Name: Bobby Branch MRN: [id number] Date of Birth: [birth year] Referring Provider (SLP): Danella Sensing, NP   Encounter Date: 10/18/2020   End of Session - 10/18/20 1345     Visit Number 5    Number of Visits 25    Date for SLP Re-Evaluation 11/28/20    Authorization Type BCBS and Medicaid    Authorization Time Period [employer] - 4 visits approved (10/16/20 to 11/08/20)    Authorization - Visit Number 1    Authorization - Number of Visits 4    SLP Start Time 6433    SLP Stop Time  2951    SLP Time Calculation (min) 40 min    Activity Tolerance Patient tolerated treatment well             Past Medical History:  Diagnosis Date   Anxiety    Depression    Diabetes mellitus without complication ([facility])    type 2   Dyspnea    inhaler   GERD (gastroesophageal reflux disease)    HLD (hyperlipidemia)    Hypertension    Stroke ([facility]) 09/2019    Past Surgical History:  Procedure Laterality Date   EYE SURGERY Left    IR ANGIO EXTRACRAN SEL COM CAROTID INNOMINATE UNI L MOD SED  04/20/2020   IR ANGIO INTRA EXTRACRAN SEL COM CAROTID INNOMINATE UNI L MOD SED  06/21/2020   IR ANGIO VERTEBRAL SEL SUBCLAVIAN INNOMINATE UNI R MOD SED  06/21/2020   IR CT HEAD LTD  04/20/2020   IR INTRAVSC STENT CERV CAROTID W/EMB-PROT MOD SED INCL ANGIO  06/21/2020   IR PERCUTANEOUS ART THROMBECTOMY/INFUSION INTRACRANIAL INC DIAG ANGIO  04/20/2020   IR RADIOLOGIST EVAL & MGMT  06/05/2020   IR RADIOLOGIST EVAL & MGMT  07/18/2020   IR US GUIDE VASC ACCESS RIGHT  04/20/2020   IR US GUIDE VASC ACCESS RIGHT  06/21/2020   RADIOLOGY WITH ANESTHESIA N/A 04/20/2020   Procedure: IR WITH ANESTHESIA;  Surgeon: Luanne Bras, MD;  Location: [facility];  Service: Radiology;  Laterality: N/A;   RADIOLOGY WITH ANESTHESIA  N/A 06/21/2020   Procedure: RADIOLOGY WITH ANESTHESIA  STENTING;  Surgeon: Corrie Mckusick, DO;  Location: [facility];  Service: Anesthesiology;  Laterality: N/A;    There were no vitals filed for this visit.   Subjective Assessment - 10/18/20 1348     Subjective "I was at a wedding" re: missed ST appointments    Currently in Pain? Yes    Pain Score 4     Pain Location Shoulder    Pain Orientation Right                   ADULT SLP TREATMENT - 10/18/20 1321       General Information   Behavior/Cognition Alert;Cooperative;Pleasant mood;Distractible      Treatment Provided   Treatment provided Cognitive-Linquistic      Cognitive-Linquistic Treatment   Treatment focused on Dysarthria    Skilled Treatment Pt has returned after multiple missed ST sessions, as pt stated "I was at a wedding in [place]." Pt has not been completing HEP as he was out of town. SLP suggested patient call ahead to alert [facility] he will not attend sessions, in which pt stated his phone was cut off. Pt aware of d/c if he does not attend appointments as scheduled. Pt  stated his speech still "needs work." In conversation and conversational speech tasks, speech intelligiblity was rated ~90% intelligible. Intermittent min verbal cues required to optimize volume and utilize over-enunciation. Pt reported fatigue and pain today, which SLP re-iterated fatigue and pain can be contributing factors to reduced carryover of speech compensations. Of note, intermittent tearful episodes exhibited this session.      Assessment / Recommendations / Plan   Plan Continue with current plan of care      Progression Toward Goals   Progression toward goals Not progressing toward goals (comment)   limited carryover of HEP             SLP Education - 10/18/20 1343     Education Details HEP BID, speech compensations    [name]    Methods Explanation;Demonstration;Handout    Comprehension Verbalized  understanding;Returned demonstration;Verbal cues required              SLP Short Term Goals - 09/27/20 1518       SLP SHORT TERM GOAL #1   Title pt will perform dysarthria HEP with occasional min A over 3 sessions    Baseline not provided yet; HEP initiated;   09-27-20    Time --    Period --    Status Partially Met    Target Date 09/28/20      SLP SHORT TERM GOAL #2   Title pt will perform any dysphagia HEP (post MBSS/FEES) with occasional min A x3 sessions    Baseline not provided yet    Time --    Period --    Status Deferred    Target Date 09/28/20      SLP SHORT TERM GOAL #3   Title Joe will follow any precautions from MBSS/FEES with modified independence x3 sessions    Baseline not provided yet    Time --    Period --    Status Deferred    Target Date 09/28/20      SLP SHORT TERM GOAL #4   Title pt will demo abdominal breathing at rest 80% of the time x2 sessions    Baseline 0%    Time --    Period --    Status Not Met    Target Date 09/28/20      SLP SHORT TERM GOAL #5   Title pt will demo volume in upper 60s with 18/20 sentence responses in 2 sessions    Baseline low-mid 60s; 09-25-20    Time --    Period --    Status Achieved    Target Date 09/28/20      SLP SHORT TERM GOAL #6   Title pt will complete patient related outcome measure    Baseline not provided yet    Time --    Period --   or 2 therapy visits   Status Not Met    Target Date 09/14/20      SLP SHORT TERM GOAL #7   Title pt will complete objective swallow eval (MBSS or FEES)    Baseline requested from referring MD    Status Achieved    Target Date 09/28/20              SLP Long Term Goals - 10/18/20 1328       SLP LONG TERM GOAL #1   Title demo swallow HEP rare min A x3 sessions    Baseline not provided yet;   09-27-20    Time 8  Period Weeks    Status On-going    Target Date 10/26/20      SLP LONG TERM GOAL #2   Title demo dysarthria (oral motor strengthing) HEP  with rare min A x3 sessions    Baseline not provided yet    Time 8    Period Weeks    Status On-going    Target Date 10/26/20      SLP LONG TERM GOAL #3   Title follow swallow precautions from MBSS/FEES with rare min A in 3 sessions    Baseline not provided yet    Time 12    Period Weeks    Status On-going    Target Date 11/28/20      SLP LONG TERM GOAL #4   Title pt will exhibit abdominal breathing 75% of the time in 5 minutes simple conversation x3 sessoins    Baseline 0%    Time 12    Period Weeks    Status On-going    Target Date 11/28/20      SLP LONG TERM GOAL #5   Title pt will demo upper 60s dB volume in 10 minutes simple conversation in 3 sessions    Baseline low 60s dB    Time 12    Period Weeks    Status On-going    Target Date 11/28/20      SLP LONG TERM GOAL #6   Title pt will improve his patient-related outcome score    Baseline to be provided session #1    Time 12    Period Weeks    Status New              Plan - 10/18/20 1346     Clinical Impression Statement "Wille Glaser" presents today with improving mild-moderate dysarthria. A MBSS was completed with WFL/WNL results. Ongoing education and training of dysarthria compensations conducted, with occasional min A required to optimize breath support and increase volume on structured tasks and in conversation. Improvement in overall articulation/intelligiblity exhibited. He will benefit from skilled ST targeting improved speech intelligibility and swallowing safety (swallow precautions and swallowing HEP).    Speech Therapy Frequency 2x / week    Duration 8 weeks    Treatment/Interventions Aspiration precaution training;Pharyngeal strengthening exercises;Diet toleration management by SLP;Oral motor exercises;Compensatory techniques;Internal/external aids;Patient/family education;SLP instruction and feedback;Cognitive reorganization;Cueing hierarchy;Trials of upgraded texture/liquids;Other (comment)    Potential to  Achieve Goals Fair    Potential Considerations Previous level of function;Cooperation/participation level    Consulted and Agree with Plan of Care Patient             Patient will benefit from skilled therapeutic intervention in order to improve the following deficits and impairments:   Dysarthria and anarthria    Problem List Patient Active Problem List   Diagnosis Date Noted   Internal carotid artery stenosis, bilateral 06/21/2020   Slow transit constipation    Spastic hemiplegia affecting nondominant side ([facility])    Stage 3b chronic kidney disease ([place])    Essential hypertension    Controlled type 2 diabetes mellitus with hyperglycemia, without long-term current use of insulin ([place])    Protein-calorie malnutrition, severe 04/25/2020   Right middle cerebral artery stroke ([facility]) 04/23/2020   Acute right MCA stroke ([facility]) 04/20/2020    Alinda Deem, MA CCC-SLP 10/18/2020, 2:07 PM  [facility] [address] [place], [place], [address] Phone: [phone]   Fax:  [phone]   Name: Jaydence Arnesen MRN: [id number] Date of  Birth: 08-16-1960

## 2020-10-23 ENCOUNTER — Encounter: Payer: Self-pay | Admitting: *Deleted

## 2020-10-23 ENCOUNTER — Ambulatory Visit
Admission: RE | Admit: 2020-10-23 | Discharge: 2020-10-23 | Disposition: A | Discharge status: Home / Self Care | Payer: Federal, State, Local not specified - PPO | Source: Ambulatory Visit | Attending: Interventional Radiology | Admitting: Interventional Radiology

## 2020-10-23 DIAGNOSIS — I6522 Occlusion and stenosis of left carotid artery: Secondary | ICD-10-CM

## 2020-10-23 HISTORY — PX: IR RADIOLOGIST EVAL & MGMT: IMG5224

## 2020-10-23 NOTE — Progress Notes (Signed)
Chief Complaint: Carotid artery disease   Referring Physician(s): Stroke Neurology  PCP: Dr. Margarita Rana Rehab: Dr. Letta Pate   History of Present Illness: Bobby Branch is a 60 y.o. male presenting today as scheduled follow up to [place], with high grade asymptomatic left ICA stenosis.    He is SP right carotid artery stent 06/21/2020 for symptomatic high grade stenosis, after presenting as code stroke 04/20/20. .     Bobby Branch joins [place] today in the clinic by himself.     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  The ICA was not stented at the time of ELVO treatment secondary to intra-cerebral hemorrhage and inability to treat with anti-platelets.     He underwent interval stenting of high grade symptomatic lesion 06/21/20. He was discharged home the next day with no problems.     Interval: We last saw Bobby Branch 07/18/2020.  At that time, he had recovered from right ICA stenting, and had residual LUE weakness, left facial droop, and mild, stable, left leg weakness.    On 07/18/20, we made the recommendation of surgical consultation for the asymptomatic high grade left ICA stenosis.  This was set up, but he tells me he was unable to make the appointment because he had to travel to [place] for a family emergency.  He did not follow up.   He tells me today that he continues to have profound weakness of the LUE, with associated left shoulder/upper arm pain.  He has diagnosis of spastic hemiplegia on Dr. Letta Pate' note 08/14/20, which references a possible future botox/therapeutic injection.  Bobby Branch does not remember if this occurred.    He has had virtual appointment with his PCP, Dr. Margarita Rana.    He denies any new right sided arm or leg weakness.  He does tell me that he is experiencing some left foot drop during ambulation, which is able to perform without a walker or cane.    He also has  complaint of difficulty taking care of all of the needs around his home, including cooking, cleaning, etc.  He is able to dress and care for himself, but house chores are more difficult.  He lives alone.    He also reports that he continues to smoke, estimating 1ppd still.    He tells me he quit marijuana.     He continues aspirin.  Sounds as if plavix has been discontinued, but he does not have his medications with him. .  Past Medical History:  Diagnosis Date   Anxiety    Depression    Diabetes mellitus without complication (HCC)    type 2   Dyspnea    inhaler   GERD (gastroesophageal reflux disease)    HLD (hyperlipidemia)    Hypertension    Stroke ([facility]) 09/2019    Past Surgical History:  Procedure Laterality Date   EYE SURGERY Left    IR ANGIO EXTRACRAN SEL COM CAROTID INNOMINATE UNI L MOD SED  04/20/2020   IR ANGIO INTRA EXTRACRAN SEL COM CAROTID INNOMINATE UNI L MOD SED  06/21/2020   IR ANGIO VERTEBRAL SEL SUBCLAVIAN INNOMINATE UNI R MOD SED  06/21/2020   IR CT HEAD LTD  04/20/2020   IR INTRAVSC STENT CERV CAROTID W/EMB-PROT MOD SED INCL ANGIO  06/21/2020   IR PERCUTANEOUS ART THROMBECTOMY/INFUSION INTRACRANIAL INC DIAG ANGIO  04/20/2020   IR RADIOLOGIST EVAL & MGMT  06/05/2020  IR RADIOLOGIST EVAL & MGMT  07/18/2020   IR US GUIDE VASC ACCESS RIGHT  04/20/2020   IR US GUIDE VASC ACCESS RIGHT  06/21/2020   RADIOLOGY WITH ANESTHESIA N/A 04/20/2020   Procedure: IR WITH ANESTHESIA;  Surgeon: Luanne Bras, MD;  Location: [facility];  Service: Radiology;  Laterality: N/A;   RADIOLOGY WITH ANESTHESIA N/A 06/21/2020   Procedure: RADIOLOGY WITH ANESTHESIA  STENTING;  Surgeon: Corrie Mckusick, DO;  Location: [facility];  Service: Anesthesiology;  Laterality: N/A;    Allergies: Patient has no known allergies.  Medications: Prior to Admission medications   Medication Sig Start Date End Date Taking? Authorizing Provider  acetaminophen (TYLENOL) 325 MG tablet Take 2 tablets (650 mg total) by mouth  every 4 (four) hours as needed for mild pain (or temp > 37.5 C (99.5 F)). 05/21/20   Angiulli, Lavon Paganini, PA-C  albuterol (PROAIR HFA) 108 (90 Base) MCG/ACT inhaler Inhale 1 puff into the lungs every 6 (six) hours as needed for wheezing or shortness of breath. 10/02/20   Argentina Donovan, PA-C  amLODipine (NORVASC) 10 MG tablet Take 1 tablet (10 mg total) by mouth daily. 07/04/20   Argentina Donovan, PA-C  aspirin 81 MG EC tablet Take 1 tablet (81 mg total) by mouth daily. 06/22/20   Louk, Bea Graff, PA-C  atorvastatin (LIPITOR) 40 MG tablet Take 1 tablet (40 mg total) by mouth daily. 07/04/20   Argentina Donovan, PA-C  clopidogrel (PLAVIX) 75 MG tablet Take 1 tablet (75 mg total) by mouth daily. 07/04/20   Argentina Donovan, PA-C  diclofenac Sodium (VOLTAREN) 1 % GEL Apply 2 grams topically 4 (four) times daily. 08/21/20   Charlott Rakes, MD  docusate sodium (COLACE) 100 MG capsule Take 1 capsule (100 mg total) by mouth 2 (two) times daily. Patient not taking: No sig reported 05/21/20   Angiulli, Lavon Paganini, PA-C  escitalopram (LEXAPRO) 10 MG tablet Take 1 tablet (10 mg total) by mouth daily. 07/04/20   Argentina Donovan, PA-C  famotidine (PEPCID) 20 MG tablet Take 1 tablet (20 mg total) by mouth daily. 07/04/20   Argentina Donovan, PA-C  gabapentin (NEURONTIN) 300 MG capsule Take 1 capsule (300 mg total) by mouth at bedtime. 08/21/20   Charlott Rakes, MD  glimepiride (AMARYL) 2 MG tablet Take 1.5 tablets (3 mg total) by mouth daily with breakfast. 07/04/20   Argentina Donovan, PA-C  hydrALAZINE (APRESOLINE) 50 MG tablet Take 1 tablet (50 mg total) by mouth 3 (three) times daily. 07/04/20   Argentina Donovan, PA-C  lidocaine (LIDODERM) 5 % Place 1 patch onto the skin daily. Remove & Discard patch within 12 hours or as directed by MD Patient not taking: No sig reported 05/21/20   Angiulli, Lavon Paganini, PA-C  Multiple Vitamin (MULTIVITAMIN WITH MINERALS) TABS tablet Take 1 tablet by mouth daily. Patient not taking: No sig  reported 05/22/20   Angiulli, Lavon Paganini, PA-C  nicotine (NICODERM CQ - DOSED IN MG/24 HR) 7 mg/24hr patch Place 1 patch (7 mg total) onto the skin daily. Patient not taking: No sig reported 05/22/20   Angiulli, Lavon Paganini, PA-C  polyethylene glycol (MIRALAX / GLYCOLAX) 17 g packet Take 17 g by mouth daily. Patient not taking: No sig reported 05/22/20   Angiulli, Lavon Paganini, PA-C  tiZANidine (ZANAFLEX) 2 MG tablet TAKE 1 TABLET(2 MG) BY MOUTH THREE TIMES DAILY 08/22/20   Charlott Rakes, MD     No family history on file.  Social History  Socioeconomic History   Marital status: Single    Spouse name: Not on file   Number of children: Not on file   Years of education: Not on file   Highest education level: Not on file  Occupational History   Not on file  Tobacco Use   Smoking status: Every Day    Types: Cigarettes   Smokeless tobacco: Never   Tobacco comments:    patient trying to quit  Vaping Use   Vaping Use: Never used  Substance and Sexual Activity   Alcohol use: Yes    Comment: beer   Drug use: Yes    Types: Cocaine, Marijuana    Comment: Last use in 04/2020   Sexual activity: Yes    Partners: Female  Other Topics Concern   Not on file  Social History Narrative   Not on file   Social Determinants of Health   Financial Resource Strain: Not on file  Food Insecurity: Not on file  Transportation Needs: Not on file  Physical Activity: Not on file  Stress: Not on file  Social Connections: Not on file       Review of Systems: A 12 point ROS discussed and pertinent positives are indicated in the HPI above.  All other systems are negative.  Review of Systems  Vital Signs: BP (!) 143/77 (BP Location: Right Arm)   Pulse 87   SpO2 98%   Physical Exam General: 60 yo male appearing older than stated age.  Well-developed, well-nourished.  No distress. HEENT: Atraumatic, normocephalic.  Conjugate gaze, extra-ocular motor intact. Left sided facial droop.  Tongue midline, and  normal left right motion.  No scleral icterus or scleral injection. No lesions on external ears, nose, lips, or gums.  Oral mucosa moist, pink.  Neck: Symmetric with no goiter enlargement.  Chest/Lungs:  Symmetric chest with inspiration/expiration.  No labored breathing.  Clear to auscultation with no wheezes, rhonchi, or rales.  Heart:  RRR, with no third heart sounds appreciated. No JVD appreciated.  Abdomen:  Soft, NT/ND, with + bowel sounds.   Genito-urinary: Deferred.  Pulse Exam:  No right bruit appreciated.  Soft left cervical bruit.   Palpable right and left radial pulse.       Imaging: No results found.  Labs:  CBC: Recent Labs    04/23/20 0345 04/24/20 0504 05/07/20 0656 06/21/20 0618  WBC 9.2 7.1 7.9 7.7  HGB 12.4* 12.7* 12.4* 12.6*  HCT 37.0* 37.3* 36.4* 39.1  PLT 311 306 366 321    COAGS: Recent Labs    04/20/20 0011 06/21/20 0618  INR 0.9 0.9  APTT 32  --     BMP: Recent Labs    05/07/20 0656 05/13/20 0854 05/17/20 0512 06/21/20 0618  NA 136 136 135 139  K 3.7 3.8 4.6 4.0  CL 105 104 104 105  CO2 '25 25 24 28  '$ GLUCOSE 125* 227* 153* 75  BUN 19 27* 33* 22*  CALCIUM 8.7* 8.8* 9.4 9.4  CREATININE 1.86* 1.96* 2.13* 2.57*  GFRNONAA 42* 39* 35* 28*    LIVER FUNCTION TESTS: Recent Labs    04/20/20 0011 04/20/20 0500 04/24/20 0504  BILITOT 0.7 0.6 0.5  AST 27 34 26  ALT '18 23 15  '$ ALKPHOS 67 68 60  PROT 5.9* 5.4* 5.4*  ALBUMIN 2.6* 2.5* 2.1*    TUMOR MARKERS: No results for input(s): AFPTM, CEA, CA199, CHROMGRNA in the last 8760 hours.      Assessment & Plan:   Bobby  Branch is a very pleasant 60 year-old male with asymptomatic extracranial carotid arterial disease, left cervical ICA.     Non-invasive imaging with duplex 04/21/20 demonstrates 80%-99% stenosis with previous angio corroboration.    I have had previous lengthy discussion with him regarding the definition and epidemiology of stroke/TIA, associated cardiovascular risk  factors, pathology/pathophysiology, the natural history of extracranial carotid arterial disease/risk of recurrent TIA/stroke, and the goals of therapy in this setting. I updated him briefly today regarding the above, emphasizing the need for maximal medical treatment. Specifically, I emphasized his need to quit smoking, as this continues to be his primary risk factor.   Regarding intervention in the scenario of asymptomatic, high grade stenosis, I shared with him the risk reduction that was realized with the RCT's ACAS & ACST, ~6%, as well as the currently accepted recurrent stroke rate with maximal medical therapy, generally between 1%-3%.  I did emphasize that this is only realized with smoking cessation.   Given his history of right hemisphere stroke, residual symptoms, and that he is right hand dominant, I did let him know that he is at risk for significant disability should he have a stroke of the left hemisphere related to his known left ICA stenosis.  He understands.    I did share my impression with him that he might be a candidate for CEA or CAS based on above discussion, but he already missed an appointment for his surgical consultation, and he told me today that he would prefer stenting/minimally invasive option that he has already had for the right ICA.     After our shared decision making, with consideration of diagnosis and surgical risk category, our plan is to proceed with angiogram and stenting, with which he prefers.   Plan: -       Continue intensive medical management, as above -       Plan for left carotid artery stenting, left ICA high grade stenosis, with Dr. Earleen Newport at [facility]. Plan for anesthesia and 23 hr observation.  - I did encourage him to follow up with his PCP, Dr. Margarita Rana on schedule - I did encourage him to follow up with Dr. Read Drivers regarding his symptoms of the left UE, possible botox injection that was previously discussed.  Also for discussion regarding his progressing  left foot drop.  -       Regarding intensive medical management, I again discussed with him the need for smoking cessation.     Electronically Signed: Corrie Mckusick 10/23/2020, 11:46 AM   I spent a total of    40 Minutes in face to face in clinical consultation, greater than 50% of which was counseling/coordinating care for high grade left ICA stenosis, possible angiogram and stenting

## 2020-10-24 ENCOUNTER — Ambulatory Visit: Payer: Federal, State, Local not specified - PPO

## 2020-10-25 ENCOUNTER — Encounter (HOSPITAL_COMMUNITY): Payer: Self-pay

## 2020-10-25 ENCOUNTER — Ambulatory Visit: Payer: Federal, State, Local not specified - PPO | Admitting: Occupational Therapy

## 2020-10-25 ENCOUNTER — Encounter: Payer: Self-pay | Admitting: Occupational Therapy

## 2020-10-25 ENCOUNTER — Ambulatory Visit: Payer: Federal, State, Local not specified - PPO

## 2020-10-25 ENCOUNTER — Other Ambulatory Visit: Payer: Self-pay

## 2020-10-25 ENCOUNTER — Ambulatory Visit (HOSPITAL_COMMUNITY)
Admission: EM | Admit: 2020-10-25 | Discharge: 2020-10-25 | Disposition: A | Discharge status: Home / Self Care | Payer: Federal, State, Local not specified - PPO | Attending: Emergency Medicine | Admitting: Emergency Medicine

## 2020-10-25 DIAGNOSIS — R41840 Attention and concentration deficit: Secondary | ICD-10-CM

## 2020-10-25 DIAGNOSIS — M25512 Pain in left shoulder: Secondary | ICD-10-CM

## 2020-10-25 DIAGNOSIS — I69354 Hemiplegia and hemiparesis following cerebral infarction affecting left non-dominant side: Secondary | ICD-10-CM

## 2020-10-25 DIAGNOSIS — R471 Dysarthria and anarthria: Secondary | ICD-10-CM

## 2020-10-25 DIAGNOSIS — G8112 Spastic hemiplegia affecting left dominant side: Secondary | ICD-10-CM

## 2020-10-25 DIAGNOSIS — I69351 Hemiplegia and hemiparesis following cerebral infarction affecting right dominant side: Secondary | ICD-10-CM

## 2020-10-25 DIAGNOSIS — G8110 Spastic hemiplegia affecting unspecified side: Secondary | ICD-10-CM

## 2020-10-25 DIAGNOSIS — M25642 Stiffness of left hand, not elsewhere classified: Secondary | ICD-10-CM

## 2020-10-25 DIAGNOSIS — R609 Edema, unspecified: Secondary | ICD-10-CM | POA: Diagnosis not present

## 2020-10-25 DIAGNOSIS — G8929 Other chronic pain: Secondary | ICD-10-CM

## 2020-10-25 DIAGNOSIS — R41841 Cognitive communication deficit: Secondary | ICD-10-CM

## 2020-10-25 DIAGNOSIS — M25612 Stiffness of left shoulder, not elsewhere classified: Secondary | ICD-10-CM

## 2020-10-25 DIAGNOSIS — R2681 Unsteadiness on feet: Secondary | ICD-10-CM

## 2020-10-25 DIAGNOSIS — R1310 Dysphagia, unspecified: Secondary | ICD-10-CM

## 2020-10-25 MED ORDER — DICLOFENAC SODIUM 1 % EX GEL
2.0000 g | Freq: Four times a day (QID) | CUTANEOUS | 1 refills | Status: AC
  Filled 2020-10-25: qty 100, 7d supply, fill #0
  Filled 2021-01-10: qty 100, 7d supply, fill #1

## 2020-10-25 NOTE — Therapy (Addendum)
[facility] [address] [place], [place], [address] Phone: [phone]   Fax:  [phone]  Occupational Therapy Treatment & Recertification  Patient Details  Name: Bobby Branch MRN: [id number] Date of Birth: 1960-11-12 Referring Provider (OT): Danella Sensing   Encounter Date: 10/25/2020   OT End of Session - 10/25/20 1104     Visit Number 7    Number of Visits 13    Date for OT Re-Evaluation 12/06/20    Authorization Type [employer] - 12 visits approved    OT Start Time 1102    OT Stop Time 1141    OT Time Calculation (min) 39 min    Activity Tolerance Patient tolerated treatment well    Behavior During Therapy WFL for tasks assessed/performed             Past Medical History:  Diagnosis Date   Anxiety    Depression    Diabetes mellitus without complication ([facility])    type 2   Dyspnea    inhaler   GERD (gastroesophageal reflux disease)    HLD (hyperlipidemia)    Hypertension    Stroke ([facility] [place]) 09/2019    Past Surgical History:  Procedure Laterality Date   EYE SURGERY Left    IR ANGIO EXTRACRAN SEL COM CAROTID INNOMINATE UNI L MOD SED  04/20/2020   IR ANGIO INTRA EXTRACRAN SEL COM CAROTID INNOMINATE UNI L MOD SED  06/21/2020   IR ANGIO VERTEBRAL SEL SUBCLAVIAN INNOMINATE UNI R MOD SED  06/21/2020   IR CT HEAD LTD  04/20/2020   IR INTRAVSC STENT CERV CAROTID W/EMB-PROT MOD SED INCL ANGIO  06/21/2020   IR PERCUTANEOUS ART THROMBECTOMY/INFUSION INTRACRANIAL INC DIAG ANGIO  04/20/2020   IR RADIOLOGIST EVAL & MGMT  06/05/2020   IR RADIOLOGIST EVAL & MGMT  07/18/2020   IR RADIOLOGIST EVAL & MGMT  10/23/2020   IR US GUIDE VASC ACCESS RIGHT  04/20/2020   IR US GUIDE VASC ACCESS RIGHT  06/21/2020   RADIOLOGY WITH ANESTHESIA N/A 04/20/2020   Procedure: IR WITH ANESTHESIA;  Surgeon: Luanne Bras, MD;  Location: [facility];  Service: Radiology;  Laterality: N/A;   RADIOLOGY WITH ANESTHESIA N/A 06/21/2020    Procedure: RADIOLOGY WITH ANESTHESIA  STENTING;  Surgeon: Corrie Mckusick, DO;  Location: [facility];  Service: Anesthesiology;  Laterality: N/A;    There were no vitals filed for this visit.   Subjective Assessment - 10/25/20 1102     Subjective  "went to my daughter's wedding in [place]"    Pertinent History R MCA stroke.  PMH:  CKD, DM, malnutrition, HTN, polysubstance abuse, prior stroke 08/21 who presents with spastic left sided hemiplegia    Currently in Pain? Yes    Pain Score 4     Pain Location Shoulder    Pain Orientation Left    Pain Descriptors / Indicators Sore    Pain Type Chronic pain    Pain Onset 1 to 4 weeks ago    Pain Frequency Intermittent    Aggravating Factors  laying down, relaxing               Reviewed goals. See goal section below for comments re: progress.   Table Slides x20   Education on making appts and rescheduling when missed.                OT Education - 10/25/20 1107     Education Details review of edema management strategies for HEP. Pt able to  verbalize understanding of strategies.    [name]    Methods Explanation    Comprehension Verbalized understanding              OT Short Term Goals - 10/25/20 1107       OT SHORT TERM GOAL #1   Title Patient will complete an HEP designed to improve range of motion and decrease pain in LUE    Baseline No HEP    Time 4    Period Weeks    Status Achieved    Target Date 09/02/20      OT SHORT TERM GOAL #2   Title Patient will demonstrate sufficient shoulder range of motion to apply deodorant and wash under left arm with pain no greater than 1-2/10    Baseline Difficulty washing and applying deodorant pain 6+/10    Time 4    Period Weeks    Status On-going   pt unable to life arm sufficient enough for applying deodorant. 10/25/20     OT SHORT TERM GOAL #3   Title Patient will demonstrate 5 degrees of active wrist flex or extension following facilitation     Baseline no volitional wrist movement    Time 4    Period Weeks    Status On-going   unable to demonstarte active movement at this time 10/25/20     OT [place] #4   Title Patient will demonstrate 90% composite wrist flexion/extension passively without pain of more than 1-2/10    Baseline Patient unable to tolerate passive stretch to fingers    Time 4    Period Weeks    Status Partially Met   PROM 90% composite flexion/extension with 6/10 pain     OT SHORT TERM GOAL #5   Title Patient will demonstrate 90 degrees of passive shoulder flexion in preparation for forward reaching    Baseline 70*    Time 4    Period Weeks    Status On-going   60* PROM shoulder flexion with pain 10/10 in RUE, 75* on tabletop with 3/10 pain              OT Long Term Goals - 10/25/20 1114       OT LONG TERM GOAL #1   Title Patient will complete updated HEP designed to improve functional use of LUE    Baseline No HEP    Time 6    Period Weeks    Status On-going      OT LONG TERM GOAL #2   Title Patient will don/doff left UE positioning devices as warranted    Baseline No current splints or slings    Time 6    Period Weeks    Status On-going   pt able to don/doff LUE splint but not consistently wearing     OT LONG TERM GOAL #3   Title Patient will demonstrate ability to cut food on plate    Baseline Unable to cut up food    Time 6    Period Weeks    Status On-going   gave information re: rocker knife 10/25/20     OT LONG TERM GOAL #4   Title Patient will demonstrate ability to peel/chop vegetables as needed for soup    Baseline Unable to cut veggies    Time 6    Period Weeks    Status On-going   reviewed and gave information re: rocker knife and vegetable chopper 10/25/20     OT  LONG TERM GOAL #5   Title Patient will demonstrate ability to button/zipper clothing as needed    Baseline Opts to wear clothing without fasteners    Time 6    Period Weeks    Status New                    Plan - 10/25/20 1122     Clinical Impression Statement Pt returns after being gone since 08/30/20. Checked goals. Pt continues to experience significant pain in LUE shoulder d/t malpositioning and tone. Pt received information re: adapted equipment today. This note certifies need for continuing OT for addresing goals.    OT Occupational Profile and History Detailed Assessment- Review of Records and additional review of physical, cognitive, psychosocial history related to current functional performance    Occupational performance deficits (Please refer to evaluation for details): ADL's;IADL's;Rest and Sleep    Body Structure / Function / Physical Skills ADL;Dexterity;Flexibility;Muscle spasms;ROM;Strength;Vision;Tone;IADL;FMC;Edema;Coordination;Balance;Body mechanics;Decreased knowledge of precautions;Endurance;Improper spinal/pelvic alignment;Pain;Sensation;Decreased knowledge of use of DME;GMC;Mobility;Proprioception    Cognitive Skills Attention;Memory;Perception;Energy/Drive;Problem Solve;Safety Awareness    Rehab Potential Good    Clinical Decision Making Several treatment options, min-mod task modification necessary    Comorbidities Affecting Occupational Performance: May have comorbidities impacting occupational performance    Modification or Assistance to Complete Evaluation  Min-Moderate modification of tasks or assist with assess necessary to complete eval    OT Frequency 1x / week    OT Duration 6 weeks   2/87/86 recertification 1x/week for 6 weeks   OT Treatment/Interventions Self-care/ADL training;Therapeutic exercise;DME and/or AE instruction;Functional Mobility Training;Cognitive remediation/compensation;Balance training;Visual/perceptual remediation/compensation;Splinting;Manual Therapy;Neuromuscular education;Fluidtherapy;Electrical Stimulation;Aquatic Barista;Iontophoresis;Passive range of motion;Therapeutic activities;Patient/family education     Plan supine manual therapy, self PROM, bed positioning LUE.    Consulted and Agree with Plan of Care Patient             Patient will benefit from skilled therapeutic intervention in order to improve the following deficits and impairments:   Body Structure / Function / Physical Skills: ADL, Dexterity, Flexibility, Muscle spasms, ROM, Strength, Vision, Tone, IADL, FMC, Edema, Coordination, Balance, Body mechanics, Decreased knowledge of precautions, Endurance, Improper spinal/pelvic alignment, Pain, Sensation, Decreased knowledge of use of DME, GMC, Mobility, Proprioception Cognitive Skills: Attention, Memory, Perception, Energy/Drive, Problem Solve, Safety Awareness      Managed medicaid CPT codes: [phone]- Therapeutic Exercise, [phone]- Neuro Re-education, 97140 - Manual Therapy, 97530 - Therapeutic Activities, 97535 - Self Care, 97014 - Electrical stimulation (unattended), [id number] - Electrical stimulation (Manual), [id number] - Ultrasound, and [id number] - Orthotic Fit    Visit Diagnosis: Chronic left shoulder pain - Plan: Ot plan of care cert/re-cert  Stiffness of left hand, not elsewhere classified - Plan: Ot plan of care cert/re-cert  Left shoulder pain, unspecified chronicity - Plan: Ot plan of care cert/re-cert  Attention and concentration deficit - Plan: Ot plan of care cert/re-cert  Hemiplegia and hemiparesis following cerebral infarction affecting right dominant side ([place]) - Plan: Ot plan of care cert/re-cert  Stiffness of left shoulder, not elsewhere classified - Plan: Ot plan of care cert/re-cert  Spastic 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 [facility]) - Plan: Ot plan of care cert/re-cert  Unsteadiness on feet - Plan: Ot plan of care cert/re-cert    Problem List Patient Active Problem List   Diagnosis Date Noted   Internal carotid artery stenosis, bilateral 06/21/2020   Slow transit constipation    Spastic hemiplegia affecting nondominant  side (HCC)    Stage 3b  chronic kidney disease ([place])    Essential hypertension    Controlled type 2 diabetes mellitus with hyperglycemia, without long-term current use of insulin ([place])    Protein-calorie malnutrition, severe 04/25/2020   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 [place], [place], [address] Phone: [phone]   Fax:  [phone]  Name: Kahil Agner MRN: [id number] Date of Birth: 04/28/1960

## 2020-10-25 NOTE — Discharge Instructions (Signed)
I refilled your voltaren gel prescription. You have 3 more tizanidine refills so I did not refill it. Talk with Dr. Margarita Rana about options for your left shoulder pain. I think you should see if you can still get the 2nd Botox shot.    Consider using compression socks/stockings for your foot and ankle swelling.

## 2020-10-25 NOTE — ED Triage Notes (Signed)
Left shoulder pain with left hand and left leg swelling. Pt states sx have been occurring since he was in the hospital in may for a stroke.

## 2020-10-25 NOTE — Patient Instructions (Addendum)
LIP and TONGUE exercises  --- DO ALL EXERCISES TWICE EACH DAY  1. Lip press - Press your lips together firmly (like making a /m/ sound) and hold 5 seconds -repeat 10 times  2. Tongue sweep - Sweep your tongue in the pockets of your mouth - touch every tooth  - five revolutions each way  3.  PUH! - 10 times       MAKE THE SOUNDS STRONG      TUH - 10 times      KUH! - 10 times  4. Move a straw from side to side in your mouth - 10 back and forth movements  5. Put air in your cheeks, and switch from side to side 10 times  *KEEP THE AIR IN and DON'T LET IT ESCAPE!!*  6. SWALLOW HARD! 10-15 times

## 2020-10-25 NOTE — ED Provider Notes (Signed)
[place]    CSN: [id number] Arrival date & time: 10/25/20  1230      History   Chief Complaint Chief Complaint  Patient presents with   Shoulder Pain    Lt shoulder    HPI Bobby Branch is a 59 y.o. male.  Patient reports chronic left shoulder pain.  Uses Voltaren gel for it but is out; patient would like it refilled.  Pain is not new, it has been there since he had his stroke over a year ago.  Patient is seeing physical therapy for left shoulder range of motion, spasticity, and pain.  Patient had 1 injection in his left shoulder of Botox and was supposed to have a second, but he did not attend the appointment for it.  Patient also complains approximately 2 to 3 weeks of bilateral lower extremity edema around his ankle and foot, worse in the left lower extremity.  When legs are elevated, edema improves.   Shoulder Pain Associated symptoms: no fever    Past Medical History:  Diagnosis Date   Anxiety    Depression    Diabetes mellitus without complication (HCC)    type 2   Dyspnea    inhaler   GERD (gastroesophageal reflux disease)    HLD (hyperlipidemia)    Hypertension    Stroke ([place]) 09/2019    Patient Active Problem List   Diagnosis Date Noted   Internal carotid artery stenosis, bilateral 06/21/2020   Slow transit constipation    Spastic hemiplegia affecting nondominant side (HCC)    Stage 3b chronic kidney disease ([place])    Essential hypertension    Controlled type 2 diabetes mellitus with hyperglycemia, without long-term current use of insulin (HCC)    Protein-calorie malnutrition, severe 04/25/2020   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   Acute right MCA stroke ([place]) 04/20/2020    Past Surgical History:  Procedure Laterality Date   EYE SURGERY Left    IR ANGIO EXTRACRAN SEL COM CAROTID INNOMINATE UNI L MOD SED  04/20/2020   IR ANGIO INTRA EXTRACRAN SEL COM CAROTID INNOMINATE UNI L MOD SED  06/21/2020   IR ANGIO VERTEBRAL SEL  SUBCLAVIAN INNOMINATE UNI R MOD SED  06/21/2020   IR CT HEAD LTD  04/20/2020   IR INTRAVSC STENT CERV CAROTID W/EMB-PROT MOD SED INCL ANGIO  06/21/2020   IR PERCUTANEOUS ART THROMBECTOMY/INFUSION INTRACRANIAL INC DIAG ANGIO  04/20/2020   IR RADIOLOGIST EVAL & MGMT  06/05/2020   IR RADIOLOGIST EVAL & MGMT  07/18/2020   IR RADIOLOGIST EVAL & MGMT  10/23/2020   IR US GUIDE VASC ACCESS RIGHT  04/20/2020   IR US GUIDE VASC ACCESS RIGHT  06/21/2020   RADIOLOGY WITH ANESTHESIA N/A 04/20/2020   Procedure: IR WITH ANESTHESIA;  Surgeon: Luanne Bras, MD;  Location: [facility];  Service: Radiology;  Laterality: N/A;   RADIOLOGY WITH ANESTHESIA N/A 06/21/2020   Procedure: RADIOLOGY WITH ANESTHESIA  STENTING;  Surgeon: Corrie Mckusick, DO;  Location: [facility];  Service: Anesthesiology;  Laterality: N/A;       Home Medications    Prior to Admission medications   Medication Sig Start Date End Date Taking? Authorizing Provider  acetaminophen (TYLENOL) 325 MG tablet Take 2 tablets (650 mg total) by mouth every 4 (four) hours as needed for mild pain (or temp > 37.5 C (99.5 F)). Patient not taking: Reported on 10/25/2020 05/21/20   Angiulli, Lavon Paganini, PA-C  albuterol [facility] HFA) [phone] Base) MCG/ACT inhaler Inhale 1 puff  into the lungs every 6 (six) hours as needed for wheezing or shortness of breath. 10/02/20   Argentina Donovan, PA-C  amLODipine (NORVASC) 10 MG tablet Take 1 tablet (10 mg total) by mouth daily. 07/04/20   Argentina Donovan, PA-C  aspirin 81 MG EC tablet Take 1 tablet (81 mg total) by mouth daily. 06/22/20   Louk, Bea Graff, PA-C  atorvastatin (LIPITOR) 40 MG tablet Take 1 tablet (40 mg total) by mouth daily. 07/04/20   Argentina Donovan, PA-C  clopidogrel (PLAVIX) 75 MG tablet Take 1 tablet (75 mg total) by mouth daily. 07/04/20   Argentina Donovan, PA-C  diclofenac Sodium (VOLTAREN) 1 % GEL Apply 2 grams topically 4 (four) times daily. 10/25/20   Carvel Getting, NP  docusate sodium (COLACE) 100 MG capsule  Take 1 capsule (100 mg total) by mouth 2 (two) times daily. Patient not taking: No sig reported 05/21/20   Angiulli, Lavon Paganini, PA-C  escitalopram (LEXAPRO) 10 MG tablet Take 1 tablet (10 mg total) by mouth daily. 07/04/20   Argentina Donovan, PA-C  famotidine (PEPCID) 20 MG tablet Take 1 tablet (20 mg total) by mouth daily. 07/04/20   Argentina Donovan, PA-C  gabapentin (NEURONTIN) 300 MG capsule Take 1 capsule (300 mg total) by mouth at bedtime. 08/21/20   Charlott Rakes, MD  glimepiride (AMARYL) 2 MG tablet Take 1.5 tablets (3 mg total) by mouth daily with breakfast. 07/04/20   Argentina Donovan, PA-C  hydrALAZINE (APRESOLINE) 50 MG tablet Take 1 tablet (50 mg total) by mouth 3 (three) times daily. 07/04/20   Argentina Donovan, PA-C  lidocaine (LIDODERM) 5 % Place 1 patch onto the skin daily. Remove & Discard patch within 12 hours or as directed by MD Patient not taking: No sig reported 05/21/20   Angiulli, Lavon Paganini, PA-C  Multiple Vitamin (MULTIVITAMIN WITH MINERALS) TABS tablet Take 1 tablet by mouth daily. Patient not taking: No sig reported 05/22/20   Angiulli, Lavon Paganini, PA-C  nicotine (NICODERM CQ - DOSED IN MG/24 HR) 7 mg/24hr patch Place 1 patch (7 mg total) onto the skin daily. Patient not taking: No sig reported 05/22/20   Angiulli, Lavon Paganini, PA-C  polyethylene glycol (MIRALAX / GLYCOLAX) 17 g packet Take 17 g by mouth daily. Patient not taking: No sig reported 05/22/20   Angiulli, Lavon Paganini, PA-C  tiZANidine (ZANAFLEX) 2 MG tablet TAKE 1 TABLET(2 MG) BY MOUTH THREE TIMES DAILY 08/22/20   Charlott Rakes, MD    Family History History reviewed. No pertinent family history.  Social History Social History   Tobacco Use   Smoking status: Every Day    Types: Cigarettes   Smokeless tobacco: Never   Tobacco comments:    patient trying to quit  Vaping Use   Vaping Use: Never used  Substance Use Topics   Alcohol use: Yes    Comment: beer   Drug use: Yes    Types: Cocaine, Marijuana     Comment: Last use in 04/2020     Allergies   Patient has no known allergies.   Review of Systems Review of Systems  Constitutional:  Negative for chills and fever.  Musculoskeletal:        Joint pain in L shoulder is unchanged from usual pain.   Skin:  Negative for wound.    Physical Exam Triage Vital Signs ED Triage Vitals  Enc Vitals Group     BP 10/25/20 1441 (!) 167/91  Pulse Rate 10/25/20 1441 86     Resp 10/25/20 1441 12     Temp 10/25/20 1441 98.1 F (36.7 C)     Temp Source 10/25/20 1441 Oral     SpO2 10/25/20 1441 97 %     Weight --      Height --      Head Circumference --      Peak Flow --      Pain Score 10/25/20 1443 3     Pain Loc --      Pain Edu? --      Excl. in [place]? --    No data found.  Updated Vital Signs BP (!) 167/91 (BP Location: Right Arm)   Pulse 86   Temp 98.1 F (36.7 C) (Oral)   Resp 12   SpO2 97%   Visual Acuity Right Eye Distance:   Left Eye Distance:   Bilateral Distance:    Right Eye Near:   Left Eye Near:    Bilateral Near:     Physical Exam Pulmonary:     Effort: Pulmonary effort is normal.  Musculoskeletal:     Comments: 3+ pitting edema in the left ankle and foot.  2+ pitting edema in right ankle and foot.  Bilateral DP and PT pulses are intact.  Left upper extremity shows spasticity when attempting to move at the shoulder joint.  Skin:    General: Skin is warm and dry.     Comments: Inspected both feet, skin intact and there are no wounds to either foot.  Neurological:     Mental Status: He is alert and oriented to person, place, and time.     UC Treatments / Results  Labs (all labs ordered are listed, but only abnormal results are displayed) Labs Reviewed - No data to display  EKG   Radiology No results found.  Procedures Procedures (including critical care time)  Medications Ordered in UC Medications - No data to display  Initial Impression / Assessment and Plan / UC Course  I have reviewed  the triage vital signs and the nursing notes.  Pertinent labs & imaging results that were available during my care of the patient were reviewed by me and considered in my medical decision making (see chart for details).  Refilled Voltaren for chronic left shoulder pain.  I suggested he speak with his PCP about options for shoulder pain if the Voltaren is not sufficient.  I suggested he pursue the second Botox injection in that joint that he missed.  He has bilateral peripheral lower extremity edema; I suspect this is a venous insufficiency problem.  I suggested he use compression socks or stockings and elevate his legs when he is able.   Final Clinical Impressions(s) / UC Diagnoses   Final diagnoses:  Chronic left shoulder pain  Peripheral edema  Spastic hemiplegia affecting nondominant side ([place])     Discharge Instructions      I refilled your voltaren gel prescription. You have 3 more tizanidine refills so I did not refill it. Talk with Dr. Margarita Rana about options for your left shoulder pain. I think you should see if you can still get the 2nd Botox shot.    Consider using compression socks/stockings for your foot and ankle swelling.    ED Prescriptions     Medication Sig Dispense Auth. Provider   diclofenac Sodium (VOLTAREN) 1 % GEL Apply 2 grams topically 4 (four) times daily. 100 g Carvel Getting, NP  PDMP not reviewed this encounter.   Carvel Getting, NP 10/25/20 1521

## 2020-10-25 NOTE — Therapy (Signed)
[facility] [address] [place], [place], [address] Phone: [phone]   Fax:  [phone]  Speech Language Pathology Treatment  Patient Details  Name: Bobby Branch MRN: [id number] Date of Birth: [birth year] Referring Provider (SLP): Danella Sensing, NP   Encounter Date: 10/25/2020   End of Session - 10/25/20 1153     Visit Number 6    Number of Visits 25    Date for SLP Re-Evaluation 11/28/20    Authorization Type BCBS and Medicaid    Authorization Time Period [employer] - 4 visits approved (10/16/20 to 11/08/20)    Authorization - Visit Number 2    Authorization - Number of Visits 4    SLP Start Time 1020    SLP Stop Time  1100    SLP Time Calculation (min) 40 min    Activity Tolerance Patient tolerated treatment well             Past Medical History:  Diagnosis Date   Anxiety    Depression    Diabetes mellitus without complication ([place])    type 2   Dyspnea    inhaler   GERD (gastroesophageal reflux disease)    HLD (hyperlipidemia)    Hypertension    Stroke ([place]) 09/2019    Past Surgical History:  Procedure Laterality Date   EYE SURGERY Left    IR ANGIO EXTRACRAN SEL COM CAROTID INNOMINATE UNI L MOD SED  04/20/2020   IR ANGIO INTRA EXTRACRAN SEL COM CAROTID INNOMINATE UNI L MOD SED  06/21/2020   IR ANGIO VERTEBRAL SEL SUBCLAVIAN INNOMINATE UNI R MOD SED  06/21/2020   IR CT HEAD LTD  04/20/2020   IR INTRAVSC STENT CERV CAROTID W/EMB-PROT MOD SED INCL ANGIO  06/21/2020   IR PERCUTANEOUS ART THROMBECTOMY/INFUSION INTRACRANIAL INC DIAG ANGIO  04/20/2020   IR RADIOLOGIST EVAL & MGMT  06/05/2020   IR RADIOLOGIST EVAL & MGMT  07/18/2020   IR RADIOLOGIST EVAL & MGMT  10/23/2020   IR US GUIDE VASC ACCESS RIGHT  04/20/2020   IR US GUIDE VASC ACCESS RIGHT  06/21/2020   RADIOLOGY WITH ANESTHESIA N/A 04/20/2020   Procedure: IR WITH ANESTHESIA;  Surgeon: Luanne Bras, MD;  Location: [facility];  Service: Radiology;   Laterality: N/A;   RADIOLOGY WITH ANESTHESIA N/A 06/21/2020   Procedure: RADIOLOGY WITH ANESTHESIA  STENTING;  Surgeon: Corrie Mckusick, DO;  Location: [facility];  Service: Anesthesiology;  Laterality: N/A;    There were no vitals filed for this visit.          ADULT SLP TREATMENT - 10/25/20 0001       General Information   Behavior/Cognition Alert;Cooperative;Pleasant mood;Distractible      Treatment Provided   Treatment provided Cognitive-Linquistic      Dysphagia Treatment   Other treatment/comments pt drank H2O multiple sips consecutive with delayed cough. SLP provided effortful swallow for pt to add to his dysarthria HEP provided today and stressed the importance of small sips/bites and slow rate, and alternate bite/sip with drier foods.      Cognitive-Linquistic Treatment   Treatment focused on Dysarthria    Skilled Treatment Pt arrives today with 2 more visits authorized by [facility] after today. SLP provided pt with HEP for dysarthria and pt req'd min A consistently for success with procedure. He was told to meet the recommended reps BID for each exercise at home for 8 weeks and then he will likely have the strength that is going to return to labial and  lingual structures - IF he has been consistent in completing his HEP provided today (see pt instructions) as prescribed. SLP talked with pt and he was able to produce 90%+ intelligible speech.      Assessment / Recommendations / Plan   Plan Continue with current plan of care      Progression Toward Goals   Progression toward goals --   SLP ?s pt's carryover of therapy work to home environment             SLP Education - 10/25/20 1150     Education Details dysarthria HEP (lips, tongue exercises), effortful swallow, precautions for swallowing    [name]    Methods Explanation;Demonstration;Verbal cues;Handout    Comprehension Verbalized understanding;Returned demonstration;Verbal cues required;Need  further instruction              SLP Short Term Goals - 10/25/20 1220       SLP SHORT TERM GOAL #1   Title pt will perform dysarthria HEP with occasional min A over 3 sessions    Baseline not provided yet; HEP initiated;   09-27-20    Status Partially Met    Target Date 09/28/20      SLP SHORT TERM GOAL #2   Title pt will perform any dysphagia HEP (post MBSS/FEES) with occasional min A x3 sessions    Baseline not provided yet    Status Deferred    Target Date 09/28/20      SLP SHORT TERM GOAL #3   Title Joe will follow any precautions from MBSS/FEES with modified independence x3 sessions    Baseline not provided yet    Status Deferred    Target Date 09/28/20      SLP SHORT TERM GOAL #4   Title pt will demo abdominal breathing at rest 80% of the time x2 sessions    Baseline 0%    Status Not Met    Target Date 09/28/20      SLP SHORT TERM GOAL #5   Title pt will demo volume in upper 60s with 18/20 sentence responses in 2 sessions    Baseline low-mid 60s; 09-25-20    Status Achieved    Target Date 09/28/20      SLP SHORT TERM GOAL #6   Title pt will complete patient related outcome measure    Baseline not provided yet    Period --   or 2 therapy visits   Status Not Met    Target Date 09/14/20      SLP SHORT TERM GOAL #7   Title pt will complete objective swallow eval (MBSS or FEES)    Baseline requested from referring MD    Status Achieved    Target Date 09/28/20              SLP Long Term Goals - 10/25/20 1220       SLP LONG TERM GOAL #1   Title demo swallow HEP rare min A x3 sessions    Baseline not provided yet;   09-27-20, 10-25-20    Time 8    Period Weeks    Status Partially Met    Target Date 11/16/20      SLP LONG TERM GOAL #2   Title demo dysarthria (oral motor strengthing) HEP with rare min A x3 sessions    Baseline not provided yet; 10-25-20    Time 8    Period Weeks    Status On-going    Target Date 11/16/20  SLP LONG TERM GOAL #3    Title follow swallow precautions from MBSS/FEES with rare min A in 3 sessions    Baseline not provided yet    Time 12    Period Weeks    Status On-going    Target Date 11/28/20      SLP LONG TERM GOAL #4   Title pt will exhibit abdominal breathing 75% of the time in 5 minutes simple conversation x3 sessoins    Baseline 0%    Time 12    Period Weeks    Status On-going    Target Date 11/28/20      SLP LONG TERM GOAL #5   Title pt will demo upper 60s dB volume in 10 minutes simple conversation in 3 sessions    Baseline low 60s dB    Time 12    Period Weeks    Status On-going    Target Date 11/28/20      SLP LONG TERM GOAL #6   Title pt will improve his patient-related outcome score    Baseline to be provided session #1    Time 12    Period Weeks    Status On-going              Plan - 10/25/20 1218     Clinical Impression Statement "Wille Glaser" presents today with improving mild-moderate dysarthria. A MBSS was completedduring this therapy course with WFL/WNL results. Ongoing education and training of dysarthria compensations conducted. SLP provided pt with dysarthria exercises for labial and lingual strength today, along with effortful swallow. Improvement in overall articulation/intelligiblity exhibited. He will benefit from skilled ST targeting improved speech intelligibility and swallowing safety (swallow precautions and swallowing HEP).    Speech Therapy Frequency 2x / week    Duration 8 weeks    Treatment/Interventions Aspiration precaution training;Pharyngeal strengthening exercises;Diet toleration management by SLP;Oral motor exercises;Compensatory techniques;Internal/external aids;Patient/family education;SLP instruction and feedback;Cognitive reorganization;Cueing hierarchy;Trials of upgraded texture/liquids;Other (comment)    Potential to Achieve Goals Fair    Potential Considerations Previous level of function;Cooperation/participation level    Consulted and Agree with  Plan of Care Patient             Patient will benefit from skilled therapeutic intervention in order to improve the following deficits and impairments:   Dysarthria and anarthria  Dysphagia, unspecified type  Cognitive communication deficit    Problem List Patient Active Problem List   Diagnosis Date Noted   Internal carotid artery stenosis, bilateral 06/21/2020   Slow transit constipation    Spastic hemiplegia affecting nondominant side (HCC)    Stage 3b chronic kidney disease ([place])    Essential hypertension    Controlled type 2 diabetes mellitus with hyperglycemia, without long-term current use of insulin ([place])    Protein-calorie malnutrition, severe 04/25/2020   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 [place], [place], [address] Phone: [phone]   Fax:  [phone]   Name: Stanly Si MRN: [id number] Date of Birth: 11-13-1960

## 2020-10-26 ENCOUNTER — Ambulatory Visit: Payer: Self-pay

## 2020-10-26 ENCOUNTER — Other Ambulatory Visit: Payer: Self-pay

## 2020-10-26 DIAGNOSIS — G8110 Spastic hemiplegia affecting unspecified side: Secondary | ICD-10-CM

## 2020-10-26 NOTE — Telephone Encounter (Signed)
Pt. States he started having left shoulder pain after his stroke 2 months ago. Reports the gabapentin "is not helping at all. I can't sleep at night." Requests something different be sent to his pharmacy. Please advise.   Answer Assessment - Initial Assessment Questions 1. ONSET: "When did the pain start?"     2 months ago 2. LOCATION: "Where is the pain located?"     Left shoulder 3. PAIN: "How bad is the pain?" (Scale 1-10; or mild, moderate, severe)   - MILD (1-3): doesn't interfere with normal activities   - MODERATE (4-7): interferes with normal activities (e.g., work or school) or awakens from sleep   - SEVERE (8-10): excruciating pain, unable to do any normal activities, unable to move arm at all due to pain     5-6 4. WORK OR EXERCISE: "Has there been any recent work or exercise that involved this part of the body?"     No 5. CAUSE: "What do you think is causing the shoulder pain?"     After his stroke 6. OTHER SYMPTOMS: "Do you have any other symptoms?" (e.g., neck pain, swelling, rash, fever, numbness, weakness)     Pain 7. PREGNANCY: "Is there any chance you are pregnant?" "When was your last menstrual period?"     N/A  Protocols used: Shoulder Pain-A-AH

## 2020-10-26 NOTE — Telephone Encounter (Signed)
Patient called on number listed below, left VM to return the call to the office to speak to a nurse about symptoms.   Message from [place] sent at 10/26/2020 11:42 AM EDT  Pt stated he is having shoulder pain and the gabapentin is not working. Pt requests call back to discuss possibly being prescribed another medication. Pt also stated he has not been able to sleep due to the pain. Cb# [phone]

## 2020-10-29 MED ORDER — GABAPENTIN 300 MG PO CAPS: 300.0000 mg | ORAL_CAPSULE | Freq: Two times a day (BID) | ORAL | 3 refills | Status: DC

## 2020-10-29 NOTE — Telephone Encounter (Signed)
I have sent a prescription for an increased dose of gabapentin to his pharmacy.  If symptoms do not improve I will need to see him in the clinic for an evaluation.

## 2020-10-29 NOTE — Telephone Encounter (Signed)
Will forward to provider  

## 2020-10-30 NOTE — Telephone Encounter (Signed)
Contacted pt to go over provider response pt didn't answer lvm  

## 2020-10-31 ENCOUNTER — Ambulatory Visit: Payer: Federal, State, Local not specified - PPO

## 2020-10-31 ENCOUNTER — Ambulatory Visit: Payer: Federal, State, Local not specified - PPO | Admitting: Occupational Therapy

## 2020-11-07 ENCOUNTER — Ambulatory Visit: Payer: Medicaid Other | Admitting: Occupational Therapy

## 2020-11-07 ENCOUNTER — Ambulatory Visit: Payer: Medicaid Other | Attending: Registered Nurse

## 2020-11-07 DIAGNOSIS — I69351 Hemiplegia and hemiparesis following cerebral infarction affecting right dominant side: Secondary | ICD-10-CM | POA: Insufficient documentation

## 2020-11-07 NOTE — Therapy (Signed)
[facility] [address] [place], [place], [address] Phone: [phone]   Fax:  [phone]  Patient Details  Name: Bobby Branch MRN: [id number] Date of Birth: September 23, 1960 Referring Provider:  Danella Sensing, NP  Encounter Date: 11/07/2020   SPEECH THERAPY DISCHARGE SUMMARY  Visits from Start of Care: 6  Current functional level related to goals / functional outcomes: See goals below for pt's goals on his last scheduled ST. Pt with intermittent attendance to ST over this therapy course. He no-showed his final visit, but had additional no-shows during this therapy course as well which may have hindered overall progress.  SLP Short Term Goals - 10/25/20 1220                SLP SHORT TERM GOAL #1    Title pt will perform dysarthria HEP with occasional min A over 3 sessions     Baseline not provided yet; HEP initiated;   09-27-20     Status Partially Met     Target Date 09/28/20          SLP SHORT TERM GOAL #2    Title pt will perform any dysphagia HEP (post MBSS/FEES) with occasional min A x3 sessions     Baseline not provided yet     Status Deferred     Target Date 09/28/20          SLP SHORT TERM GOAL #3    Title Joe will follow any precautions from MBSS/FEES with modified independence x3 sessions     Baseline not provided yet     Status Deferred     Target Date 09/28/20          SLP SHORT TERM GOAL #4    Title pt will demo abdominal breathing at rest 80% of the time x2 sessions     Baseline 0%     Status Not Met     Target Date 09/28/20          SLP SHORT TERM GOAL #5    Title pt will demo volume in upper 60s with 18/20 sentence responses in 2 sessions     Baseline low-mid 60s; 09-25-20     Status Achieved     Target Date 09/28/20          SLP SHORT TERM GOAL #6    Title pt will complete patient related outcome measure     Baseline not provided yet     Period --   or 2 therapy visits    Status Not Met      Target Date 09/14/20          SLP SHORT TERM GOAL #7    Title pt will complete objective swallow eval (MBSS or FEES)     Baseline requested from referring MD     Status Achieved     Target Date 09/28/20                     SLP Long Term Goals - 10/25/20 1220                SLP LONG TERM GOAL #1    Title demo swallow HEP rare min A x3 sessions     Baseline not provided yet;   09-27-20, 10-25-20     Time 8     Period Weeks     Status Partially Met     Target Date 11/16/20  SLP LONG TERM GOAL #2    Title demo dysarthria (oral motor strengthing) HEP with rare min A x3 sessions     Baseline not provided yet; 10-25-20     Time 8     Period Weeks     Status On-going     Target Date 11/16/20          SLP LONG TERM GOAL #3    Title follow swallow precautions from MBSS/FEES with rare min A in 3 sessions     Baseline not provided yet     Time 12     Period Weeks     Status On-going     Target Date 11/28/20          SLP LONG TERM GOAL #4    Title pt will exhibit abdominal breathing 75% of the time in 5 minutes simple conversation x3 sessoins     Baseline 0%     Time 12     Period Weeks     Status On-going     Target Date 11/28/20          SLP LONG TERM GOAL #5    Title pt will demo upper 60s dB volume in 10 minutes simple conversation in 3 sessions     Baseline low 60s dB     Time 12     Period Weeks     Status On-going     Target Date 11/28/20          SLP LONG TERM GOAL #6    Title pt will improve his patient-related outcome score     Baseline to be provided session #1     Time 12     Period Weeks     Status On-going        Remaining deficits: Dysarthria.   Education / Equipment: HEP procedure, speech compensations.   Patient agrees to discharge. Patient goals were partially met. Patient is being discharged due to not returning since the last visit.Marland Kitchen     [facility] ,[place], [place]  11/07/2020, 5:24 PM  [facility] [address] [place], [place], [address] Phone: [phone]   Fax:  [phone]

## 2020-11-08 NOTE — Therapy (Unsigned)
[facility] [address] [place], [place], [address] Phone: [phone]   Fax:  [phone]  November 08, 2020    No Recipients  Occupational Therapy Discharge Summary   Patient: Bobby Branch MRN: [id number] Date of Birth: 12-Mar-1960  Diagnosis: Hemiplegia and hemiparesis following cerebral infarction affecting right dominant side [facility])  Referring Provider (OT): Danella Sensing   The above patient had been seen in Occupational Therapy 7 times,missing the last three appointments without notification, attendance prior to this was inconsistent.    The treatment consisted of NA The patient is: Unchanged -  Patient continues to report significant shoulder pain  Subjective: Patient has had limited participation in therapy.  Patient with multiple socio-economic issues which impede his ability to attend therapy. Patient with multiple complaints about his caregivers, and requesting documentation relating to receiving additional help in his home.  Unfortunately patient did not return (nor phone to cancel) for his last scheduled visit to discuss this documentation.      Functional Status at Discharge: Unable to comment as patient has not been seen in several weeks  No Goals Met    Sincerely,  Aniyla Harling, Algernon Huxley, OT/L  CC No Recipients  [facility] [address] [name], [place], [address] Phone: [phone]   Fax:  [phone]  Patient: [name] MRN: [id number] Date of Birth: 10/17/1960

## 2020-11-22 ENCOUNTER — Ambulatory Visit: Payer: Federal, State, Local not specified - PPO | Attending: Family Medicine | Admitting: Family Medicine

## 2020-11-22 ENCOUNTER — Telehealth: Payer: Self-pay | Admitting: Family Medicine

## 2020-11-22 ENCOUNTER — Other Ambulatory Visit: Payer: Self-pay

## 2020-11-22 ENCOUNTER — Encounter: Payer: Self-pay | Admitting: Family Medicine

## 2020-11-22 VITALS — BP 168/90 | HR 73 | Ht 66.0 in | Wt 137.4 lb

## 2020-11-22 DIAGNOSIS — E1165 Type 2 diabetes mellitus with hyperglycemia: Secondary | ICD-10-CM | POA: Diagnosis not present

## 2020-11-22 DIAGNOSIS — E1122 Type 2 diabetes mellitus with diabetic chronic kidney disease: Secondary | ICD-10-CM

## 2020-11-22 DIAGNOSIS — N184 Chronic kidney disease, stage 4 (severe): Secondary | ICD-10-CM

## 2020-11-22 DIAGNOSIS — R29898 Other symptoms and signs involving the musculoskeletal system: Secondary | ICD-10-CM

## 2020-11-22 DIAGNOSIS — F32A Depression, unspecified: Secondary | ICD-10-CM

## 2020-11-22 DIAGNOSIS — E11618 Type 2 diabetes mellitus with other diabetic arthropathy: Secondary | ICD-10-CM | POA: Diagnosis not present

## 2020-11-22 DIAGNOSIS — R600 Localized edema: Secondary | ICD-10-CM

## 2020-11-22 DIAGNOSIS — I6523 Occlusion and stenosis of bilateral carotid arteries: Secondary | ICD-10-CM | POA: Diagnosis not present

## 2020-11-22 DIAGNOSIS — M7502 Adhesive capsulitis of left shoulder: Secondary | ICD-10-CM

## 2020-11-22 DIAGNOSIS — G8110 Spastic hemiplegia affecting unspecified side: Secondary | ICD-10-CM

## 2020-11-22 DIAGNOSIS — Z1159 Encounter for screening for other viral diseases: Secondary | ICD-10-CM

## 2020-11-22 DIAGNOSIS — I129 Hypertensive chronic kidney disease with stage 1 through stage 4 chronic kidney disease, or unspecified chronic kidney disease: Secondary | ICD-10-CM

## 2020-11-22 LAB — POCT GLYCOSYLATED HEMOGLOBIN (HGB A1C): HbA1c, POC (controlled diabetic range): 6.7 % (ref 0.0–7.0)

## 2020-11-22 LAB — GLUCOSE, POCT (MANUAL RESULT ENTRY): POC Glucose: 227 mg/dl — AB (ref 70–99)

## 2020-11-22 MED ORDER — DULOXETINE HCL 60 MG PO CPEP
60.0000 mg | ORAL_CAPSULE | Freq: Every day | ORAL | 3 refills | Status: DC
  Filled 2020-11-22: qty 30, 30d supply, fill #0
  Filled 2021-01-10: qty 30, 30d supply, fill #1

## 2020-11-22 MED ORDER — GLIMEPIRIDE 2 MG PO TABS
3.0000 mg | ORAL_TABLET | Freq: Every day | ORAL | 6 refills | Status: DC
Start: 2020-11-22 — End: 2021-03-11
  Filled 2020-11-22: qty 45, 30d supply, fill #0
  Filled 2021-01-10: qty 45, 30d supply, fill #1

## 2020-11-22 MED ORDER — TIZANIDINE HCL 4 MG PO TABS
ORAL_TABLET | ORAL | 1 refills | Status: DC
  Filled 2020-11-22: qty 90, 30d supply, fill #0
  Filled 2021-01-10: qty 90, 30d supply, fill #1

## 2020-11-22 MED ORDER — CARVEDILOL 12.5 MG PO TABS
12.5000 mg | ORAL_TABLET | Freq: Two times a day (BID) | ORAL | 6 refills | Status: DC
  Filled 2020-11-22: qty 60, 30d supply, fill #0
  Filled 2021-01-10: qty 60, 30d supply, fill #1

## 2020-11-22 NOTE — Progress Notes (Signed)
Subjective:  Patient ID: Bobby Branch, male    DOB: [birth year]  Age: 60 y.o. MRN: [id number]  CC: Diabetes   HPI Bobby Branch is a 60 y.o. 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  Interval History: His left shoulder continues to hurt and he received Botox injection in 08/2020 and underwent PT/OT.  He ambulates with a Rollator due to weakness in his legs. Pain prevents him from sleeping. He missed several OT appointments and was discharged from OT on 11/07/20; notes reviewed. The patient said he was in [place] and could not make it. Complains of swelling in his feet for the last 2 weeks.  He has been compliant with his diabetes medications.  Denies presence of hypoglycemia, numbness in extremities.  His blood pressure is elevated and he endorses taking his antihypertensives as prescribed. Past Medical History:  Diagnosis Date   Anxiety    Depression    Diabetes mellitus without complication (HCC)    type 2   Dyspnea    inhaler   GERD (gastroesophageal reflux disease)    HLD (hyperlipidemia)    Hypertension    Stroke ([place]) 09/2019    Past Surgical History:  Procedure Laterality Date   EYE SURGERY Left    IR ANGIO EXTRACRAN SEL COM CAROTID INNOMINATE UNI L MOD SED  04/20/2020   IR ANGIO INTRA EXTRACRAN SEL COM CAROTID INNOMINATE UNI L MOD SED  06/21/2020   IR ANGIO VERTEBRAL SEL SUBCLAVIAN INNOMINATE UNI R MOD SED  06/21/2020   IR CT HEAD LTD  04/20/2020   IR INTRAVSC STENT CERV CAROTID W/EMB-PROT MOD SED INCL ANGIO  06/21/2020   IR PERCUTANEOUS ART THROMBECTOMY/INFUSION INTRACRANIAL INC DIAG ANGIO  04/20/2020   IR RADIOLOGIST EVAL & MGMT  06/05/2020   IR RADIOLOGIST EVAL & MGMT  07/18/2020   IR RADIOLOGIST EVAL & MGMT  10/23/2020   IR US GUIDE VASC ACCESS RIGHT  04/20/2020   IR US GUIDE VASC ACCESS RIGHT  06/21/2020   RADIOLOGY WITH  ANESTHESIA N/A 04/20/2020   Procedure: IR WITH ANESTHESIA;  Surgeon: Luanne Bras, MD;  Location: [facility];  Service: Radiology;  Laterality: N/A;   RADIOLOGY WITH ANESTHESIA N/A 06/21/2020   Procedure: RADIOLOGY WITH ANESTHESIA  STENTING;  Surgeon: Corrie Mckusick, DO;  Location: [facility];  Service: Anesthesiology;  Laterality: N/A;    History reviewed. No pertinent family history.  No Known Allergies  Outpatient Medications Prior to Visit  Medication Sig Dispense Refill   acetaminophen (TYLENOL) 325 MG tablet Take 2 tablets (650 mg total) by mouth every 4 (four) hours as needed for mild pain (or temp > 37.5 C (99.5 F)).     albuterol (PROAIR HFA) 108 (90 Base) MCG/ACT inhaler Inhale 1 puff into the lungs every 6 (six) hours as needed for wheezing or shortness of breath. 8.5 g 0   aspirin 81 MG EC tablet Take 1 tablet (81 mg total) by mouth daily. 30 tablet 11   atorvastatin (LIPITOR) 40 MG tablet Take 1 tablet (40 mg total) by mouth daily. 30 tablet 3   clopidogrel (PLAVIX) 75 MG tablet Take 1 tablet (75 mg total) by mouth daily. 30 tablet 3   diclofenac Sodium (VOLTAREN) 1 % GEL Apply 2 grams topically 4 (four) times daily. 100 g 1   docusate sodium (COLACE) 100 MG capsule Take 1 capsule (100 mg total) by  mouth 2 (two) times daily. 10 capsule 0   famotidine (PEPCID) 20 MG tablet Take 1 tablet (20 mg total) by mouth daily. 30 tablet 2   gabapentin (NEURONTIN) 300 MG capsule Take 1 capsule (300 mg total) by mouth 2 (two) times daily. 60 capsule 3   hydrALAZINE (APRESOLINE) 50 MG tablet Take 1 tablet (50 mg total) by mouth 3 (three) times daily. 90 tablet 2   lidocaine (LIDODERM) 5 % Place 1 patch onto the skin daily. Remove & Discard patch within 12 hours or as directed by MD 30 patch 0   Multiple Vitamin (MULTIVITAMIN WITH MINERALS) TABS tablet Take 1 tablet by mouth daily.     nicotine (NICODERM CQ - DOSED IN MG/24 HR) 7 mg/24hr patch Place 1 patch (7 mg total) onto the skin daily. 14 patch 0    polyethylene glycol (MIRALAX / GLYCOLAX) 17 g packet Take 17 g by mouth daily. 14 each 0   amLODipine (NORVASC) 10 MG tablet Take 1 tablet (10 mg total) by mouth daily. 30 tablet 3   escitalopram (LEXAPRO) 10 MG tablet Take 1 tablet (10 mg total) by mouth daily. 30 tablet 3   glimepiride (AMARYL) 2 MG tablet Take 1.5 tablets (3 mg total) by mouth daily with breakfast. 45 tablet 2   tiZANidine (ZANAFLEX) 2 MG tablet TAKE 1 TABLET(2 MG) BY MOUTH THREE TIMES DAILY 90 tablet 3   No facility-administered medications prior to visit.     ROS Review of Systems  Constitutional:  Negative for activity change and appetite change.  HENT:  Negative for sinus pressure and sore throat.   Eyes:  Negative for visual disturbance.  Respiratory:  Negative for cough, chest tightness and shortness of breath.   Cardiovascular:  Positive for leg swelling. Negative for chest pain.  Gastrointestinal:  Negative for abdominal distention, abdominal pain, constipation and diarrhea.  Endocrine: Negative.   Genitourinary:  Negative for dysuria.  Musculoskeletal:  Negative for joint swelling and myalgias.       See HPI  Skin:  Negative for rash.  Allergic/Immunologic: Negative.   Neurological:  Positive for weakness. Negative for light-headedness and numbness.  Psychiatric/Behavioral:  Positive for sleep disturbance. Negative for dysphoric mood and suicidal ideas.    Objective:  BP (!) 168/90   Pulse 73   Ht _0  (1.676 m)   Wt 137 lb 6.4 oz (62.3 kg)   SpO2 98%   BMI 22.18 kg/m   BP/Weight 11/22/2020 10/25/2020 05/06/4740  Systolic BP 595 638 756  Diastolic BP 90 91 77  Wt. (Lbs) 137.4 - -  BMI 22.18 - -      Physical Exam Constitutional:      Appearance: He is well-developed.  Cardiovascular:     Rate and Rhythm: Normal rate.     Heart sounds: Normal heart sounds. No murmur heard. Pulmonary:     Effort: Pulmonary effort is normal.     Breath sounds: Normal breath sounds. No wheezing or rales.   Chest:     Chest wall: No tenderness.  Abdominal:     General: Bowel sounds are normal. There is no distension.     Palpations: Abdomen is soft. There is no mass.     Tenderness: There is no abdominal tenderness.  Musculoskeletal:     Right lower leg: No edema.     Left lower leg: Edema (1+ edema on medial malleolus) present.     Comments: Active abduction of left arm restricted to 15 degrees. Left hand  and forearm in a brace Tenderness on palpation of left shoulder Right arm is normal.  Neurological:     Mental Status: He is alert and oriented to person, place, and time.  Psychiatric:        Mood and Affect: Mood normal.    CMP Latest Ref Rng & Units 06/21/2020 05/17/2020 05/13/2020  Glucose 70 - 99 mg/dL 75 153(H) 227(H)  BUN 6 - 20 mg/dL 22(H) 33(H) 27(H)  Creatinine 0.61 - 1.24 mg/dL 2.57(H) 2.13(H) 1.96(H)  Sodium 135 - 145 mmol/L 139 135 136  Potassium 3.5 - 5.1 mmol/L 4.0 4.6 3.8  Chloride 98 - 111 mmol/L 105 104 104  CO2 22 - 32 mmol/L _0 Calcium 8.9 - 10.3 mg/dL 9.4 9.4 8.8(L)  Total Protein 6.5 - 8.1 g/dL - - -  Total Bilirubin 0.3 - 1.2 mg/dL - - -  Alkaline Phos 38 - 126 U/L - - -  AST 15 - 41 U/L - - -  ALT 0 - 44 U/L - - -    Lipid Panel     Component Value Date/Time   CHOL 229 (H) 04/20/2020 0500   TRIG 238 (H) 04/20/2020 0500   HDL 83 04/20/2020 0500   CHOLHDL 2.8 04/20/2020 0500   VLDL 48 (H) 04/20/2020 0500   LDLCALC 98 04/20/2020 0500    CBC    Component Value Date/Time   WBC 7.7 06/21/2020 0618   RBC 4.11 (L) 06/21/2020 0618   HGB 12.6 (L) 06/21/2020 0618   HCT 39.1 06/21/2020 0618   PLT 321 06/21/2020 0618   MCV 95.1 06/21/2020 0618   MCH 30.7 06/21/2020 0618   MCHC 32.2 06/21/2020 0618   RDW 13.4 06/21/2020 0618   LYMPHSABS 2.1 06/21/2020 0618   MONOABS 0.6 06/21/2020 0618   EOSABS 0.5 06/21/2020 0618   BASOSABS 0.1 06/21/2020 0618    Lab Results  Component Value Date   HGBA1C 6.7 11/22/2020    Assessment & Plan:  1.  Controlled type 2 diabetes mellitus with hyperglycemia, without long-term current use of insulin (HCC) Controlled with A1c of 6.7 Continue current regimen Unable to use SGLT2 inhibitor due to renal function and due to his left spastic hemiparesis he will be unable to administer a GLP-1 injectable Counseled on Diabetic diet, my plate method, 465 minutes of moderate intensity exercise/week Blood sugar logs with fasting goals of 80-120 mg/dl, random of less than 180 and in the event of sugars less than 60 mg/dl or greater than 400 mg/dl encouraged to notify the clinic. Advised on the need for annual eye exams, annual foot exams, Pneumonia vaccine.  glimepiride (AMARYL) 2 MG tablet; Take 1.5 tablets (3 mg total) by mouth daily with breakfast.  Dispense: 45 tablet; Refill: 6 - POCT glucose (manual entry) - POCT glycosylated hemoglobin (Hb A1C) - CMP14+EGFR  2. Bilateral carotid artery stenosis Status post right ICA stent He will be following up with interventional to discuss procedure for left carotid stenosis Aggressive risk factor modification  3. Depression, unspecified depression type Controlled Previously on Lexapro which has been switched to Cymbalta due to the analgesic effect of the latter - DULoxetine (CYMBALTA) 60 MG capsule; Take 1 capsule (60 mg total) by mouth daily.  Dispense: 30 capsule; Refill: 3  4. Spastic hemiplegia affecting nondominant side (HCC) Secondary to stroke Risk factor modification including high intensity statin use He was noncompliant with PT/OT Will refer again - Ambulatory referral to Occupational Therapy  5. Adhesive capsulitis of left shoulder  associated with type 2 diabetes mellitus ([place]) Uncontrolled Status post Botox injection Placed on Cymbalta Referred for PT again Follow-up with rehab medicine for possible additional injections - tiZANidine (ZANAFLEX) 4 MG tablet; TAKE 1 TABLET(2 MG) BY MOUTH THREE TIMES DAILY  Dispense: 90 tablet; Refill: 1 -  DULoxetine (CYMBALTA) 60 MG capsule; Take 1 capsule (60 mg total) by mouth daily.  Dispense: 30 capsule; Refill: 3  6. Pedal edema He does have slight edema in his left ankle which could be dependent Given he is also on amlodipine which can cause edema I have made a regimen change Encouraged to comply with a low-sodium diet, elevate feet, use compression stockings   7. Weakness of both lower extremities Residual from stroke - Ambulatory referral to Physical Therapy  8. Screening for viral disease - HCV Ab w Reflex to Quant PCR - HIV Antibody (routine testing w rflx)  - 9. 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 Discussed medication compliance, adverse effects. - carvedilol (COREG) 12.5 MG tablet; Take 1 tablet (12.5 mg total) by mouth 2 (two) times daily with a meal.  Dispense: 60 tablet; Refill: 6    Health Care Maintenance: Declines vaccinations.  We will address other healthcare maintenance at next visit Meds ordered this encounter  Medications   tiZANidine (ZANAFLEX) 4 MG tablet    Sig: TAKE 1 TABLET(2 MG) BY MOUTH THREE TIMES DAILY    Dispense:  90 tablet    Refill:  1    Dose increase   DULoxetine (CYMBALTA) 60 MG capsule    Sig: Take 1 capsule (60 mg total) by mouth daily.    Dispense:  30 capsule    Refill:  3    Discontinue Lexaprol   carvedilol (COREG) 12.5 MG tablet    Sig: Take  1 tablet (12.5 mg total) by mouth 2 (two) times daily with a meal.    Dispense:  60 tablet    Refill:  6    Discontinue amlodipine   glimepiride (AMARYL) 2 MG tablet    Sig: Take 1.5 tablets (3 mg total) by mouth daily with breakfast.    Dispense:  45 tablet    Refill:  6    Follow-up: Return in about 2 weeks (around 12/06/2020) for BP evaluation; PCP 3 months.    51 minutes of total face to face time spent including median intraservice time reviewing previous histories and test results, counseling patient on diagnosis of adhesive capsulitis, pedal edema and differentials and also discussing chronic medical conditions and plan.  Time spent ordering medications, investigations, documenting in the chart.  All questions were answered to the patient's satisfaction    Charlott Rakes, MD, FAAFP. [facility] and [place], [place]   11/22/2020, 2:31 PM

## 2020-11-22 NOTE — Progress Notes (Signed)
Not sleeping Pain in left shoulder. Swelling in hands and feet.

## 2020-11-22 NOTE — Patient Instructions (Signed)
Adhesive Capsulitis Adhesive capsulitis, also called frozen shoulder, causes the shoulder to become stiff and painful to move. This condition happens when there is inflammation of the tendons and ligaments that surround the shoulder joint (shoulder capsule). What are the causes? This condition may be caused by: An injury to your shoulder joint. Straining your shoulder. Not moving your shoulder for a period of time. This can happen if your arm was injured or in a sling. Long-standing conditions, such as: Diabetes. Thyroid problems. Heart disease. Stroke. Rheumatoid arthritis. Lung disease. In some cases, the cause is not known. What increases the risk? You are more likely to develop this condition if you are: A woman. Older than 58 years of age. What are the signs or symptoms? Symptoms of this condition include: Pain in your shoulder when you move your arm. There may also be pain when parts of your shoulder are touched. The pain may be worse at night or when you are resting. A sore or aching shoulder. The inability to move your shoulder normally. Muscle spasms. How is this diagnosed? This condition is diagnosed with a physical exam and imaging tests, such as an X-ray or MRI. How is this treated? This condition may be treated with: Treatment of the underlying cause or condition. Medicine. Medicine may be given to relieve pain, inflammation, or muscle spasms. Steroid injections into the shoulder joint. Physical therapy. This involves performing exercises to get the shoulder moving again. Acupuncture. This is a type of treatment that involves stimulating specific points on your body by inserting thin needles through your skin. Shoulder manipulation. This is a procedure to move the shoulder into another position. It is done after you are given a medicine to make you fall asleep (general anesthetic). The joint may also be injected with salt water at high pressure to break down  scarring. Surgery. This may be done in severe cases when other treatments have failed. Although most people recover completely from adhesive capsulitis, some may not regain full shoulder movement. Follow these instructions at home: Managing pain, stiffness, and swelling   If directed, put ice on the injured area: Put ice in a plastic bag. Place a towel between your skin and the bag. Leave the ice on for 20 minutes, 2-3 times per day. If directed, apply heat to the affected area before you exercise. Use the heat source that your health care provider recommends, such as a moist heat pack or a heating pad. Place a towel between your skin and the heat source. Leave the heat on for 20-30 minutes. Remove the heat if your skin turns bright red. This is especially important if you are unable to feel pain, heat, or cold. You may have a greater risk of getting burned. General instructions Take over-the-counter and prescription medicines only as told by your health care provider. If you are being treated with physical therapy, follow instructions from your physical therapist. Avoid exercises that put a lot of demand on your shoulder, such as throwing. These exercises can make pain worse. Keep all follow-up visits as told by your health care provider. This is important. Contact a health care provider if: You develop new symptoms. Your symptoms get worse. Summary Adhesive capsulitis, also called frozen shoulder, causes the shoulder to become stiff and painful to move. You are more likely to have this condition if you are a woman and over age 58. It is treated with physical therapy, medicines, and sometimes surgery. This information is not intended to replace advice given  to you by your health care provider. Make sure you discuss any questions you have with your health care provider. Document Revised: 06/26/2017 Document Reviewed: 06/26/2017 Elsevier Patient Education  [place].

## 2020-11-22 NOTE — Telephone Encounter (Signed)
PT calling stating that he is needing to have something stronger to help with his shoulder pain. Pt states that the tizanidine is not helping him at all and is requesting to have something else sent in so that he is able to sleep. Please advise.      [facility] [place] [address]  Phone: [phone] Fax: [phone]  Hours: M-F 8:30a-5:30p

## 2020-11-23 ENCOUNTER — Other Ambulatory Visit: Payer: Self-pay | Admitting: Family Medicine

## 2020-11-23 DIAGNOSIS — N184 Chronic kidney disease, stage 4 (severe): Secondary | ICD-10-CM

## 2020-11-23 DIAGNOSIS — I129 Hypertensive chronic kidney disease with stage 1 through stage 4 chronic kidney disease, or unspecified chronic kidney disease: Secondary | ICD-10-CM

## 2020-11-23 LAB — CMP14+EGFR
ALT: 26 IU/L (ref 0–44)
AST: 34 IU/L (ref 0–40)
Albumin/Globulin Ratio: 1.6 (ref 1.2–2.2)
Albumin: 4.3 g/dL (ref 3.8–4.9)
Alkaline Phosphatase: 92 IU/L (ref 44–121)
BUN/Creatinine Ratio: 8 — ABNORMAL LOW (ref 9–20)
BUN: 18 mg/dL (ref 6–24)
Bilirubin Total: 0.3 mg/dL (ref 0.0–1.2)
CO2: 23 mmol/L (ref 20–29)
Calcium: 9.4 mg/dL (ref 8.7–10.2)
Chloride: 102 mmol/L (ref 96–106)
Creatinine, Ser: 2.19 mg/dL — ABNORMAL HIGH (ref 0.76–1.27)
Globulin, Total: 2.7 g/dL (ref 1.5–4.5)
Glucose: 207 mg/dL — ABNORMAL HIGH (ref 70–99)
Potassium: 4.5 mmol/L (ref 3.5–5.2)
Sodium: 140 mmol/L (ref 134–144)
Total Protein: 7 g/dL (ref 6.0–8.5)
eGFR: 34 mL/min/{1.73_m2} — ABNORMAL LOW (ref >59)

## 2020-11-23 LAB — HIV ANTIBODY (ROUTINE TESTING W REFLEX): HIV Screen 4th Generation wRfx: NONREACTIVE

## 2020-11-23 LAB — HCV INTERPRETATION

## 2020-11-23 LAB — HCV AB W REFLEX TO QUANT PCR: HCV Ab: <0.1 s/co ratio (ref 0.0–0.9)

## 2020-11-23 NOTE — Telephone Encounter (Signed)
I just saw him and Cymbalta was just prescribed yesterday.  I would recommend he start the Cymbalta.

## 2020-11-26 NOTE — Telephone Encounter (Signed)
Called pt left VM.

## 2020-11-28 ENCOUNTER — Other Ambulatory Visit (HOSPITAL_COMMUNITY): Payer: Self-pay | Admitting: Interventional Radiology

## 2020-11-28 DIAGNOSIS — I771 Stricture of artery: Secondary | ICD-10-CM

## 2020-11-30 ENCOUNTER — Telehealth (HOSPITAL_COMMUNITY): Payer: Self-pay | Admitting: Radiology

## 2020-11-30 NOTE — Telephone Encounter (Signed)
Called pt to schedule carotid artery stenting with Dr. Corrie Mckusick. No answer and no VM. JM

## 2020-12-03 ENCOUNTER — Telehealth (HOSPITAL_COMMUNITY): Payer: Self-pay

## 2020-12-03 NOTE — Telephone Encounter (Signed)
Pt will come 11/1 for P2Y12 for stenting with Dr. Earleen Newport on 11/4. AW

## 2020-12-04 ENCOUNTER — Telehealth: Payer: Self-pay

## 2020-12-04 NOTE — Telephone Encounter (Signed)
Patient name and DOB has been verified Patient was informed of lab results. Patient had no questions.  

## 2020-12-04 NOTE — Telephone Encounter (Signed)
-----   Message from Charlott Rakes, MD sent at 11/23/2020  1:50 PM EDT ----- Please inform him that his kidney function is still slightly abnormal and I have referred him to a nephrologist for further evaluation.  Other labs are stable.

## 2020-12-05 ENCOUNTER — Ambulatory Visit: Payer: Federal, State, Local not specified - PPO | Attending: Family Medicine

## 2020-12-05 ENCOUNTER — Ambulatory Visit: Payer: Federal, State, Local not specified - PPO | Admitting: Occupational Therapy

## 2020-12-05 ENCOUNTER — Other Ambulatory Visit (HOSPITAL_COMMUNITY)
Admission: RE | Admit: 2020-12-05 | Discharge: 2020-12-05 | Disposition: A | Discharge status: Home / Self Care | Payer: Federal, State, Local not specified - PPO | Source: Ambulatory Visit | Attending: Interventional Radiology | Admitting: Interventional Radiology

## 2020-12-05 ENCOUNTER — Encounter: Payer: Self-pay | Admitting: Occupational Therapy

## 2020-12-05 ENCOUNTER — Other Ambulatory Visit (HOSPITAL_COMMUNITY): Payer: Self-pay

## 2020-12-05 ENCOUNTER — Other Ambulatory Visit: Payer: Self-pay | Admitting: Radiology

## 2020-12-05 ENCOUNTER — Other Ambulatory Visit: Payer: Self-pay

## 2020-12-05 ENCOUNTER — Telehealth (HOSPITAL_COMMUNITY): Payer: Self-pay

## 2020-12-05 DIAGNOSIS — M6281 Muscle weakness (generalized): Secondary | ICD-10-CM

## 2020-12-05 DIAGNOSIS — R41840 Attention and concentration deficit: Secondary | ICD-10-CM

## 2020-12-05 DIAGNOSIS — R2681 Unsteadiness on feet: Secondary | ICD-10-CM | POA: Insufficient documentation

## 2020-12-05 DIAGNOSIS — M25642 Stiffness of left hand, not elsewhere classified: Secondary | ICD-10-CM | POA: Diagnosis present

## 2020-12-05 DIAGNOSIS — M25512 Pain in left shoulder: Secondary | ICD-10-CM | POA: Insufficient documentation

## 2020-12-05 DIAGNOSIS — I771 Stricture of artery: Secondary | ICD-10-CM

## 2020-12-05 DIAGNOSIS — R2689 Other abnormalities of gait and mobility: Secondary | ICD-10-CM | POA: Diagnosis present

## 2020-12-05 DIAGNOSIS — I69351 Hemiplegia and hemiparesis following cerebral infarction affecting right dominant side: Secondary | ICD-10-CM | POA: Insufficient documentation

## 2020-12-05 DIAGNOSIS — R29898 Other symptoms and signs involving the musculoskeletal system: Secondary | ICD-10-CM | POA: Diagnosis not present

## 2020-12-05 DIAGNOSIS — I69354 Hemiplegia and hemiparesis following cerebral infarction affecting left non-dominant side: Secondary | ICD-10-CM | POA: Diagnosis present

## 2020-12-05 DIAGNOSIS — M25612 Stiffness of left shoulder, not elsewhere classified: Secondary | ICD-10-CM | POA: Diagnosis present

## 2020-12-05 DIAGNOSIS — G8929 Other chronic pain: Secondary | ICD-10-CM | POA: Insufficient documentation

## 2020-12-05 NOTE — Telephone Encounter (Signed)
Called pt twice to have him come so he could get a p2y12 and covid test, no answer, no vm. Pt was supposed to come yesterday when he go back from [place] but I never heard back from him. I also tried to contact the daughter and left her a vm. AW

## 2020-12-05 NOTE — Therapy (Signed)
[facility] [address] [place], [place], [address] Phone: [phone]   Fax:  [phone]  Occupational Therapy Evaluation  Patient Details  Name: Bobby Branch MRN: [id number] Date of Birth: 26-May-1960 Referring Provider (OT): Charlott Rakes, MD   Encounter Date: 12/05/2020   OT End of Session - 12/05/20 1514     Visit Number 1    Number of Visits 13    Date for OT Re-Evaluation 01/30/21    Authorization Type [employer] (used 7 visits prior POC)    OT Start Time 1445    OT Stop Time 1530    OT Time Calculation (min) 45 min    Activity Tolerance Patient tolerated treatment well    Behavior During Therapy WFL for tasks assessed/performed             Past Medical History:  Diagnosis Date   Anxiety    Depression    Diabetes mellitus without complication ([place])    type 2   Dyspnea    inhaler   GERD (gastroesophageal reflux disease)    HLD (hyperlipidemia)    Hypertension    Stroke ([place]) 09/2019    Past Surgical History:  Procedure Laterality Date   EYE SURGERY Left    IR ANGIO EXTRACRAN SEL COM CAROTID INNOMINATE UNI L MOD SED  04/20/2020   IR ANGIO INTRA EXTRACRAN SEL COM CAROTID INNOMINATE UNI L MOD SED  06/21/2020   IR ANGIO VERTEBRAL SEL SUBCLAVIAN INNOMINATE UNI R MOD SED  06/21/2020   IR CT HEAD LTD  04/20/2020   IR INTRAVSC STENT CERV CAROTID W/EMB-PROT MOD SED INCL ANGIO  06/21/2020   IR PERCUTANEOUS ART THROMBECTOMY/INFUSION INTRACRANIAL INC DIAG ANGIO  04/20/2020   IR RADIOLOGIST EVAL & MGMT  06/05/2020   IR RADIOLOGIST EVAL & MGMT  07/18/2020   IR RADIOLOGIST EVAL & MGMT  10/23/2020   IR US GUIDE VASC ACCESS RIGHT  04/20/2020   IR US GUIDE VASC ACCESS RIGHT  06/21/2020   RADIOLOGY WITH ANESTHESIA N/A 04/20/2020   Procedure: IR WITH ANESTHESIA;  Surgeon: Luanne Bras, MD;  Location: [facility];  Service: Radiology;  Laterality: N/A;   RADIOLOGY WITH ANESTHESIA N/A 06/21/2020   Procedure:  RADIOLOGY WITH ANESTHESIA  STENTING;  Surgeon: Corrie Mckusick, DO;  Location: [facility];  Service: Anesthesiology;  Laterality: N/A;    There were no vitals filed for this visit.   Subjective Assessment - 12/05/20 1517     Subjective  Pt is a 60 year old male that presents to Neuro OPOT again for spastic hemiplegia affecting nondominant side. Pt was a patient at this clinic earlier this year for same referral however missed several session d/t being out of town and noncompliance with scheduled visits and was then discharged from therapy. Pt is here again for another try and therapy for addressing LUE. Pt with significant PMH for CKD, DM, malnutrition, HTN, polysubstance abuse, prior stroke August 2021 and March 2022 with revascularization in May 2022.    Pertinent History R MCA stroke.  PMH:  CKD, DM, malnutrition, HTN, polysubstance abuse, prior stroke 08/21 who presents with spastic left sided hemiplegia    Limitations Fall Risk, LUE Hemi    Patient Stated Goals "get rid of shoulder pain, get this arm right, moving again"    Currently in Pain? Yes    Pain Score 3    9/10 with movement and with laying down)   Pain Location Shoulder    Pain Orientation Left  Pain Descriptors / Indicators Sore;Aching    Pain Type Chronic pain    Pain Onset More than a month ago    Pain Frequency Constant    Aggravating Factors  laying down               [facility] OT Assessment - 12/05/20 1450       Assessment   Medical Diagnosis R MCA CVA    Referring Provider (OT) Charlott Rakes, MD    Onset Date/Surgical Date 04/20/20    Hand Dominance Right    Prior Therapy CIR      Precautions   Precautions Fall    Required Braces or Orthoses Other Brace/Splint    Other Brace/Splint custom resting hand splint LUE      Balance Screen   Has the patient fallen in the past 6 months Yes    How many times? 2   defer to PT     Home  Environment   Family/patient expects to be discharged to: Private residence     [place] Shower/Tub Tub/Shower unit    [place] seat;Cane - single point;Walker - 4 wheels      Prior Function   Level of Independence Independent with basic ADLs;Independent with household mobility with device    Vocation On disability    Leisure make music      ADL   Eating/Feeding Needs assist with cutting food    Grooming Modified independent    Upper Body Bathing Modified independent   needs help with bathing RUE but no help available right now   Lower Body Bathing Modified independent    Upper Body Dressing Increased time    Lower Body Dressing Modified independent    Toilet Transfer Modified independent    Toileting - Clothing Manipulation Modified independent    [place]    Tub/Shower Transfer Modified independent    ADL comments shower bench, has rollator and cane but does not use      IADL   Shopping Takes care of all shopping needs independently    Light Housekeeping [place] small items, rinses stockings, etc.;Maintains house alone or with occasional assistance    Meal Prep Needs to have meals prepared and served;Does not utilize stove or oven;Prepares adequate meal if supplied with ingredients   pt reports not cooking anything currently - made spaghetti and took longer but it was ok last week   [employer] independently on public transportation    Medication Management Is responsible for taking medication in correct dosages at correct time    Physiological scientist financial matters independently (budgets, writes checks, pays rent, bills goes to bank), collects and keeps track of income      Mobility   Mobility Status History of falls    Mobility Status Comments ambulated into evaluation without AD, has rollator and cane at home      Written Expression   Dominant Hand Right      Vision - History   Baseline Vision  Wears glasses only for reading    Visual History Retinopathy    Additional Comments retinopathy - worsening since stroke      Cognition   Overall Cognitive Status Impaired/Different from baseline      Observation/Other Assessments   Focus on Therapeutic Outcomes (FOTO)  N/A - not acute      Posture/Postural Control  Posture/Postural Control Postural limitations    Postural Limitations Rounded Shoulders;Forward head;Posterior pelvic tilt;Flexed trunk    Posture Comments R bias in standing and sitting      Sensation   Light Touch Appears Intact      Coordination   Gross Motor Movements are Fluid and Coordinated No    Fine Motor Movements are Fluid and Coordinated No    Box and Blocks unable      Perception   Perception Impaired      Praxis   Praxis Not tested      Edema   Edema swelling in dorsum of hand LUE      Tone   Assessment Location Left Upper Extremity      ROM / Strength   AROM / PROM / Strength PROM;AROM      AROM   Overall AROM  Deficits    Overall AROM Comments LUE no volitional movement      PROM   Overall PROM  Deficits    Overall PROM Comments not assessed d/t pain - very limited in LUE shoulder flexion    PROM Assessment Site Shoulder    Right/Left Shoulder Left    Left Shoulder Flexion 70 Degrees   approximate     Hand Function   Left Hand Grip (lbs) unable      LUE Tone   LUE Tone Hypertonic;Moderate      LUE Tone   Hypertonic Details shoulder IR, elbow flex, wrist flex, finger flex                                OT Short Term Goals - 12/05/20 1524       OT SHORT TERM GOAL #1   Title Pt will be independent with new HEP for improving range of motion and decreasing pain in LUE    Baseline needs review from previous time    Time 4    Period Weeks    Status New    Target Date 01/02/21      OT SHORT TERM GOAL #2   Title Patient will demonstrate sufficient shoulder range of motion and/or ability to lift arm  sufficiently to apply deodorant and wash under left arm with pain no greater than 1-2/10    Baseline unable to lift arm sufficiently    Time 4    Period Weeks    Status New    Target Date 01/02/21      OT SHORT TERM GOAL #3   Title Pt will demonstrate 10% of active flexion and extension in LUE hand for increasing and progressing towards grasp/release    Baseline no volitional movement    Time 4    Period Weeks    Status New      OT SHORT TERM GOAL #4   Title Pt will verbalize understanding of splint wear and care instructions for increasing prolonged stretch in LUE hand.    Baseline has custom resting hand splint from previous sessions    Time 4    Period Weeks    Status New   PROM 90% composite flexion/extension with 6/10 pain     OT SHORT TERM GOAL #5   Title Patient will demonstrate 90 degrees of passive shoulder flexion in LUE in preparation for forward reaching    Baseline ~70*    Time 4    Period Weeks    Status New  OT Long Term Goals - 12/05/20 1527       OT LONG TERM GOAL #1   Title Patient will complete updated HEP designed to improve functional use of LUE    Baseline No HEP    Time 8    Period Weeks    Status New    Target Date 01/30/21      OT LONG TERM GOAL #2   Title Pt will verbalize understanding of adapted strategies and equipment PRN for increasing safety and independence with ADLs and IADLs (cutting food, vegetables, washing dishes, etc)    Baseline --    Time 8    Period Weeks    Status New      OT LONG TERM GOAL #3   Title Patient will demonstrate ability to cut food on plate    Baseline Unable to cut up food    Time 8    Period Weeks    Status New      OT LONG TERM GOAL #4   Title Pt will demonstrate ability to stabilize a dish in LUE and wash with RUE    Baseline --    Time 8    Period Weeks    Status New      OT LONG TERM GOAL #5   Title Patient will demonstrate ability to button/zipper clothing as needed     Baseline Opts to wear clothing without fasteners    Time 8    Period Weeks    Status New                   Plan - 12/05/20 1522     Clinical Impression Statement Pt is a 60 year old male that presents to Neuro OPOT again for spastic hemiplegia affecting nondominant side. Pt was referred to OPOT s/p R MCA stroke in March and revascularization in May 2022 and was discharged d/t noncompliance with appointments in October 2022. Pt presents with spastic left sided hemiplegia, painful shoulder and hand, edema hand, decreased range of motion throughout LUE, decreased balance, decreased attention/safety awareness, and declining vision - all of which impede independent performance of basic self care skills, and IADL. Pt would benefit from skilled occupational therapy for targeting these areas of deficit, increasing independence and decreasing caregiver burden.    OT Occupational Profile and History Detailed Assessment- Review of Records and additional review of physical, cognitive, psychosocial history related to current functional performance    Occupational performance deficits (Please refer to evaluation for details): ADL's;IADL's;Rest and Sleep    Body Structure / Function / Physical Skills ADL;Dexterity;Flexibility;Muscle spasms;ROM;Strength;Vision;Tone;IADL;FMC;Edema;Coordination;Balance;Body mechanics;Decreased knowledge of precautions;Endurance;Improper spinal/pelvic alignment;Pain;Sensation;Decreased knowledge of use of DME;GMC;Mobility;Proprioception    Cognitive Skills Attention;Memory;Perception;Energy/Drive;Problem Solve;Safety Awareness    Rehab Potential Good    Clinical Decision Making Several treatment options, min-mod task modification necessary    Comorbidities Affecting Occupational Performance: May have comorbidities impacting occupational performance    Modification or Assistance to Complete Evaluation  Min-Moderate modification of tasks or assist with assess necessary to  complete eval    OT Frequency 1x / week    OT Duration 8 weeks   12 visits over 8 weeks d/t any scheduling conflicts   OT Treatment/Interventions Self-care/ADL training;Therapeutic exercise;DME and/or AE instruction;Functional Mobility Training;Cognitive remediation/compensation;Balance training;Visual/perceptual remediation/compensation;Splinting;Manual Therapy;Neuromuscular education;Fluidtherapy;Electrical Stimulation;Aquatic Barista;Iontophoresis;Passive range of motion;Therapeutic activities;Patient/family education    Plan supine manual therapy, self PROM, bed positioning LUE.    Consulted and Agree with Plan of Care Patient  Patient will benefit from skilled therapeutic intervention in order to improve the following deficits and impairments:   Body Structure / Function / Physical Skills: ADL, Dexterity, Flexibility, Muscle spasms, ROM, Strength, Vision, Tone, IADL, FMC, Edema, Coordination, Balance, Body mechanics, Decreased knowledge of precautions, Endurance, Improper spinal/pelvic alignment, Pain, Sensation, Decreased knowledge of use of DME, GMC, Mobility, Proprioception Cognitive Skills: Attention, Memory, Perception, Energy/Drive, Problem Solve, Safety Awareness     Visit Diagnosis: Unsteadiness on feet  Stiffness of left hand, not elsewhere classified  Chronic left shoulder pain  Muscle weakness (generalized)  Attention and concentration deficit  Left shoulder pain, unspecified chronicity  Stiffness of left shoulder, not elsewhere classified  Spastic 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 [facility])    Problem List Patient Active Problem List   Diagnosis Date Noted   Internal carotid artery stenosis, bilateral 06/21/2020   Slow transit constipation    Spastic hemiplegia affecting nondominant side ([facility])    Stage 3b chronic kidney disease ([place])    Essential hypertension    Controlled type 2  diabetes mellitus with hyperglycemia, without long-term current use of insulin ([place])    Protein-calorie malnutrition, severe 04/25/2020   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 [place], [place], [address] Phone: [phone]   Fax:  [phone]  Name: Bobby Branch MRN: [id number] Date of Birth: 04-Feb-1960

## 2020-12-06 ENCOUNTER — Encounter (HOSPITAL_COMMUNITY): Payer: Self-pay | Admitting: *Deleted

## 2020-12-06 ENCOUNTER — Ambulatory Visit: Payer: Federal, State, Local not specified - PPO | Admitting: Pharmacist

## 2020-12-06 ENCOUNTER — Telehealth (HOSPITAL_COMMUNITY): Payer: Self-pay

## 2020-12-06 ENCOUNTER — Other Ambulatory Visit: Payer: Self-pay | Admitting: Radiology

## 2020-12-06 ENCOUNTER — Other Ambulatory Visit: Payer: Self-pay

## 2020-12-06 NOTE — Therapy (Signed)
[facility] [address] [place], [place], [address] Phone: [phone]   Fax:  [phone]  Physical Therapy Evaluation  Patient Details  Name: Bobby Branch MRN: [id number] Date of Birth: [birth year] Referring Provider (PT): Dr. Charlane Ferretti, Margarita Rana   Encounter Date: 12/05/2020   PT End of Session - 12/05/20 2355     Visit Number 1    Number of Visits 13    Date for PT Re-Evaluation 01/16/21    Authorization Type [employer] Josem Kaufmann sent for 12 visits)    Authorization - Visit Number 0    Authorization - Number of Visits 12    Progress Note Due on Visit 13    PT Start Time 1400    PT Stop Time 1445    PT Time Calculation (min) 45 min    Equipment Utilized During Treatment Gait belt    Activity Tolerance Patient tolerated treatment well    Behavior During Therapy WFL for tasks assessed/performed             Past Medical History:  Diagnosis Date   Anxiety    Depression    Diabetes mellitus without complication ([place])    type 2   Dyspnea    inhaler   GERD (gastroesophageal reflux disease)    History of kidney stones    HLD (hyperlipidemia)    Hypertension    Stroke ([place]) 09/2019   left arm and leg weak    Past Surgical History:  Procedure Laterality Date   EYE SURGERY Left    IR ANGIO EXTRACRAN SEL COM CAROTID INNOMINATE UNI L MOD SED  04/20/2020   IR ANGIO INTRA EXTRACRAN SEL COM CAROTID INNOMINATE UNI L MOD SED  06/21/2020   IR ANGIO VERTEBRAL SEL SUBCLAVIAN INNOMINATE UNI R MOD SED  06/21/2020   IR CT HEAD LTD  04/20/2020   IR INTRAVSC STENT CERV CAROTID W/EMB-PROT MOD SED INCL ANGIO  06/21/2020   IR PERCUTANEOUS ART THROMBECTOMY/INFUSION INTRACRANIAL INC DIAG ANGIO  04/20/2020   IR RADIOLOGIST EVAL & MGMT  06/05/2020   IR RADIOLOGIST EVAL & MGMT  07/18/2020   IR RADIOLOGIST EVAL & MGMT  10/23/2020   IR US GUIDE VASC ACCESS RIGHT  04/20/2020   IR US GUIDE VASC ACCESS RIGHT  06/21/2020   RADIOLOGY WITH  ANESTHESIA N/A 04/20/2020   Procedure: IR WITH ANESTHESIA;  Surgeon: Luanne Bras, MD;  Location: [facility];  Service: Radiology;  Laterality: N/A;   RADIOLOGY WITH ANESTHESIA N/A 06/21/2020   Procedure: RADIOLOGY WITH ANESTHESIA  STENTING;  Surgeon: Corrie Mckusick, DO;  Location: [facility];  Service: Anesthesiology;  Laterality: N/A;    There were no vitals filed for this visit.    Subjective Assessment - 12/05/20 1409     Subjective 04/20/20 had stroke with R MCA. My left leg has gotten worst. My R shoulder still hurts. It hurts worst when I am sleeping.    Pertinent History 60 y.o. 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 s/p emergent cerebral angiogram with  successful thrombectomy with small volume SAH sylvian sulci; cocaine +    Limitations Standing    How long can you sit comfortably? unlimited    How long can you stand comfortably? <5 min    How long can you walk comfortably? <10 min    Patient Stated Goals To be  able to walk again by myself    Currently in Pain? Yes    Pain Score 4     Pain Location Shoulder    Pain Orientation Left    Pain Onset 1 to 4 weeks ago    Pain Frequency Constant    Aggravating Factors  sleeping    Pain Relieving Factors position changes                [facility] PT Assessment - 12/05/20 1413       Assessment   Medical Diagnosis R MCA stroke    Referring Provider (PT) Dr. Charlane Ferretti, Margarita Rana    Onset Date/Surgical Date 04/20/20    Hand Dominance Right    Prior Therapy CIR      Precautions   Precautions [place] residence    Living Arrangements Alone    Available Help at Discharge Family    Type of [place] Access Level entry    Home Equipment Wheelchair - manual;Walker - 4 wheels      Prior Function   Level of  Independence Independent with basic ADLs;Independent with household mobility with device      Cognition   Overall Cognitive Status Within Functional Limits for tasks assessed      Sensation   Light Touch Appears Intact      Coordination   Heel Shin Test UTA      Posture/Postural Control   Posture Comments R trunk lean      Strength   Overall Strength Deficits      Transfers   Transfers Sit to Stand;Stand to Sit    Sit to Stand 7: Independent    Stand to Sit 7: Independent    Comments Requires RUE support      Ambulation/Gait   Ambulation/Gait Yes    Ambulation/Gait Assistance 4: Min guard;7: Independent    Ambulation Distance (Feet) 115 Feet    Gait Pattern Decreased step length - left;Step-through pattern;Decreased stance time - left;Decreased stride length;Decreased hip/knee flexion - left;Decreased dorsiflexion - left;Decreased weight shift to left;Left foot flat;Left flexed knee in stance;Poor foot clearance - left      Standardized Balance Assessment   Standardized Balance Assessment Five Times Sit to Stand;10 meter walk test    Five times sit to stand comments  29 sec with R UE support    10 Meter Walk 0.48 m/s without AD      Functional Gait  Assessment   Gait assessed  Yes    Gait Level Surface Walks 20 ft, slow speed, abnormal gait pattern, evidence for imbalance or deviates 10-15 in outside of the 12 in walkway width. Requires more than 7 sec to ambulate 20 ft.    Change in Gait Speed Makes only minor adjustments to walking speed, or accomplishes a change in speed with significant gait deviations, deviates 10-15 in outside the 12 in walkway width, or changes speed but loses balance but is able to recover and continue walking.    Gait with Horizontal Head Turns Performs head turns smoothly with slight change in gait velocity (eg, minor disruption to smooth gait path), deviates 6-10 in outside 12 in walkway width, or uses an assistive device.    Gait with Vertical Head  Turns Performs task with slight change in gait velocity (eg, minor disruption to smooth gait path), deviates 6 - 10 in outside 12 in walkway width or uses assistive device  Gait and Pivot Turn Pivot turns safely in greater than 3 sec and stops with no loss of balance, or pivot turns safely within 3 sec and stops with mild imbalance, requires small steps to catch balance.    Step Over Obstacle Is able to step over one shoe box (4.5 in total height) but must slow down and adjust steps to clear box safely. May require verbal cueing.    Gait with Narrow Base of Support Ambulates less than 4 steps heel to toe or cannot perform without assistance.    Gait with Eyes Closed Walks 20 ft, uses assistive device, slower speed, mild gait deviations, deviates 6-10 in outside 12 in walkway width. Ambulates 20 ft in less than 9 sec but greater than 7 sec.    Ambulating Backwards Walks 20 ft, slow speed, abnormal gait pattern, evidence for imbalance, deviates 10-15 in outside 12 in walkway width.    Steps Two feet to a stair, must use rail.    Total Score 13    FGA comment: 13/30                        Objective measurements completed on examination: See above findings.                  PT Short Term Goals - 12/06/20 1520       PT SHORT TERM GOAL #1   Title Patient to demo I in initial HEP    Time 4    Period Weeks    Status New    Target Date 01/02/21      PT SHORT TERM GOAL #2   Title Pt will be able to stand up without UE support to improve functional strength    Baseline Requires 1 UE support    Time 4    Period Weeks    Status New    Target Date 01/02/21      PT SHORT TERM GOAL #3   Title --    Baseline --    Time --    Period --    Status --    Target Date 08/16/20      PT SHORT TERM GOAL #4   Title --    Baseline --    Time --    Period --    Status --    Target Date 08/16/20      PT SHORT TERM GOAL #5   Title --    Baseline --    Time --     Period --    Status --    Target Date --               PT Long Term Goals - 12/05/20 2355       PT LONG TERM GOAL #1   Title Patient will demo 5x sit to stand with one UE support <18 seconds to improve overall functional strength    Baseline 29 sec with one UE support (12/05/20)    Time 6    Period Weeks    Status New    Target Date 01/16/21      PT LONG TERM GOAL #2   Title Pt will demo gait speed of at least 0.48m/s to improve community ambulation    Baseline 0.48 m/s (12/05/20)    Time 6    Period Weeks    Status New    Target Date 01/16/21      PT LONG TERM  GOAL #3   Title Pt will dem FGA score of 20/30 to improve fucntional gait and balance    Baseline 13/30 (12/05/20)    Time 6    Period Weeks    Status New    Target Date 01/16/21                    Plan - 12/05/20 2355     Clinical Impression Statement Patient is a 60 y.o. 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 Objective impairments include decreased functional strength, decreased functional gait and balance, and decreased ability to access community. Patient will benefit from skilled PT to address above impairments and improve overall function.    Personal Factors and Comorbidities Comorbidity 2;Past/Current Experience;Time since onset of injury/illness/exacerbation    Comorbidities CVA, DM    Examination-Activity Limitations Locomotion Level;Transfers;Squat;Stairs;Stand;Lift;Dressing    Examination-Participation Restrictions Cleaning;Community Activity;Laundry;Meal Prep;Shop;Yard Work    Stability/Clinical Decision Making Stable/Uncomplicated    Clinical Decision Making Low    Rehab Potential Good    PT Frequency 2x / week    PT Duration 6 weeks    PT Treatment/Interventions ADLs/Self Care Home Management;DME Instruction;Gait training;Stair training;Functional mobility training;Therapeutic  activities;Therapeutic exercise;Balance training;Neuromuscular re-education;Patient/family education;Cryotherapy;Moist Heat;Electrical Stimulation;Manual techniques;Orthotic Fit/Training;Passive range of motion;Energy conservation;Joint Manipulations    PT Next Visit Plan Work on [place], work on hip flexion/knee flexion synnergy pattern  (Supine heel slides, box taps) with rapid movement for fast twitch mm fibers, work on sit to stand without UE support to improve use of L LE    PT Home Exercise Plan Supine heel slides    Consulted and Agree with Plan of Care Patient             Patient will benefit from skilled therapeutic intervention in order to improve the following deficits and impairments:  Abnormal gait, Decreased coordination, Difficulty walking, Decreased endurance, Decreased activity tolerance, Decreased balance, Decreased mobility, Decreased strength, Decreased range of motion, Impaired flexibility, Increased fascial restricitons, Hypomobility, Impaired sensation, Impaired tone, Impaired UE functional use, Improper body mechanics, Postural dysfunction, Pain  Visit Diagnosis: Hemiplegia and hemiparesis following cerebral infarction affecting right dominant side (HCC)  Unsteadiness on feet  Other abnormalities of gait and mobility  Muscle weakness (generalized)     Problem List Patient Active Problem List   Diagnosis Date Noted   Internal carotid artery stenosis, bilateral 06/21/2020   Slow transit constipation    Spastic hemiplegia affecting nondominant side (HCC)    Stage 3b chronic kidney disease ([place])    Essential hypertension    Controlled type 2 diabetes mellitus with hyperglycemia, without long-term current use of insulin (HCC)    Protein-calorie malnutrition, severe 04/25/2020   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

## 2020-12-06 NOTE — Progress Notes (Signed)
Spoke with pt for pre-op call. Pt has hx of HTN, stroke and Diabetes. Last A1C was 7.3 on 11/22/20. He states he hasn't check his fasting blood sugar for several weeks. When asked if he was taking his Aspirin and Plavix, he states he's not been on Plavix for several weeks. States he is taking the 81 mg ASA. Pt states he has transportation with [employer] for tomorrow. I called [facility] [employer] to ask them to pick him up earlier than it was scheduled for because pt needs a Covid test done prior to the procedure. They state that they do not have him scheduled for transportation tomorrow, they did transport him here yesterday for blood work. They also stated that they need 48 hour notice for someone needing to be here at 5:45 AM. I then called and spoke with Anderson Malta, Network engineer for Dr. Earleen Newport and explained all of this to her. She is going to talk with Dr. Earleen Newport and will let me know he plans going to proceed with the procedure. She states she will let me know.

## 2020-12-06 NOTE — Telephone Encounter (Signed)
Called to confirm that pt has a ride for tomorrow procedure with Dr. Earleen Newport. He said that he is aware and has a ride for tomorrow and to go home on Saturday. He said he was already given the rest of the instructions regarding being NPO. AW

## 2020-12-07 ENCOUNTER — Encounter (HOSPITAL_COMMUNITY): Payer: Self-pay | Admitting: Certified Registered"

## 2020-12-07 ENCOUNTER — Ambulatory Visit (HOSPITAL_COMMUNITY): Admission: RE | Admit: 2020-12-07 | Payer: Federal, State, Local not specified - PPO | Source: Home / Self Care

## 2020-12-07 ENCOUNTER — Ambulatory Visit (HOSPITAL_COMMUNITY): Admission: RE | Admit: 2020-12-07 | Payer: Federal, State, Local not specified - PPO | Source: Ambulatory Visit

## 2020-12-07 HISTORY — DX: Personal history of urinary calculi: Z87.442

## 2020-12-07 SURGERY — IR WITH ANESTHESIA
Anesthesia: General

## 2020-12-07 NOTE — Progress Notes (Signed)
Patient has not shown for procedure today.  Multiple attempts to reach patient with no success.  IR aware and stated procedure will be cancelled.  Dr. Lanetta Inch is aware.

## 2020-12-07 NOTE — Anesthesia Preprocedure Evaluation (Deleted)
Anesthesia Evaluation  Patient identified by MRN, date of birth, ID band Patient awake    Reviewed: Allergy & Precautions, NPO status , Patient's Chart, lab work & pertinent test results, reviewed documented beta blocker date and time   Airway        Dental   Pulmonary neg pulmonary ROS, Current Smoker and Patient abstained from smoking.,           Cardiovascular hypertension, Pt. on home beta blockers and Pt. on medications   TTE 2022 1. Technically suboptima saline microcavitation study; no obvious shunt  but cannot completely exclude with this study.  2. Left ventricular ejection fraction, by estimation, is 55 to 60%. The  left ventricle has normal function. The left ventricle has no regional  wall motion abnormalities. There is mild left ventricular hypertrophy.  Left ventricular diastolic parameters  are consistent with Grade I diastolic dysfunction (impaired relaxation).  3. Right ventricular systolic function is normal. The right ventricular  size is normal.  4. The mitral valve is normal in structure. No evidence of mitral valve  regurgitation. No evidence of mitral stenosis.  5. The aortic valve is tricuspid. Aortic valve regurgitation is not  visualized. No aortic stenosis is present.  6. The inferior vena cava is normal in size with greater than 50%  respiratory variability, suggesting right atrial pressure of 3 mmHg.    Neuro/Psych PSYCHIATRIC DISORDERS Anxiety Depression CVA negative neurological ROS  negative psych ROS   GI/Hepatic Neg liver ROS, GERD  ,  Endo/Other  diabetes, Type 2  Renal/GU Renal InsufficiencyRenal disease  negative genitourinary   Musculoskeletal negative musculoskeletal ROS (+)   Abdominal   Peds  Hematology  (+) Blood dyscrasia (on plavix), ,   Anesthesia Other Findings medical history significant for anxiety/depression, DM2, HTN and CVA. 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 The right ICA was not stented at the time of ELVO treatment secondary to intra-cerebral hemorrhage and inability to treat with antiplatelets. He underwent interval stenting of the right ICA 06/21/20 by Dr. Earleen Newport.  Reproductive/Obstetrics                             Anesthesia Physical Anesthesia Plan  ASA: 3  Anesthesia Plan: General   Post-op Pain Management:    Induction: Intravenous  PONV Risk Score and Plan: Midazolam, Dexamethasone and Ondansetron  Airway Management Planned: Oral ETT  Additional Equipment: Arterial line  Intra-op Plan:   Post-operative Plan: Extubation in OR  Informed Consent: I have reviewed the patients History and Physical, chart, labs and discussed the procedure including the risks, benefits and alternatives for the proposed anesthesia with the patient or authorized representative who has indicated his/her understanding and acceptance.     Dental advisory given  Plan Discussed with: CRNA  Anesthesia Plan Comments: (Case cancelled. Pt did not arrive for procedure. )       Anesthesia Quick Evaluation

## 2020-12-10 LAB — PLATELET INHIBITION P2Y12

## 2020-12-11 ENCOUNTER — Ambulatory Visit: Payer: Federal, State, Local not specified - PPO | Admitting: Occupational Therapy

## 2020-12-11 ENCOUNTER — Encounter: Payer: Self-pay | Admitting: Occupational Therapy

## 2020-12-11 ENCOUNTER — Other Ambulatory Visit: Payer: Self-pay

## 2020-12-11 DIAGNOSIS — R41840 Attention and concentration deficit: Secondary | ICD-10-CM

## 2020-12-11 DIAGNOSIS — M6281 Muscle weakness (generalized): Secondary | ICD-10-CM

## 2020-12-11 DIAGNOSIS — M25512 Pain in left shoulder: Secondary | ICD-10-CM

## 2020-12-11 DIAGNOSIS — I69351 Hemiplegia and hemiparesis following cerebral infarction affecting right dominant side: Secondary | ICD-10-CM | POA: Diagnosis not present

## 2020-12-11 DIAGNOSIS — G8929 Other chronic pain: Secondary | ICD-10-CM

## 2020-12-11 DIAGNOSIS — R2681 Unsteadiness on feet: Secondary | ICD-10-CM

## 2020-12-11 DIAGNOSIS — M25642 Stiffness of left hand, not elsewhere classified: Secondary | ICD-10-CM

## 2020-12-11 DIAGNOSIS — I69354 Hemiplegia and hemiparesis following cerebral infarction affecting left non-dominant side: Secondary | ICD-10-CM

## 2020-12-11 NOTE — Therapy (Signed)
[facility] [address] [place], [place], [address] Phone: [phone]   Fax:  [phone]  Occupational Therapy Treatment  Patient Details  Name: Bobby Branch MRN: [id number] Date of Birth: 13-Dec-1960 Referring Provider (OT): Charlott Rakes, MD   Encounter Date: 12/11/2020   OT End of Session - 12/11/20 1412     Visit Number 2    Number of Visits 13    Date for OT Re-Evaluation 01/30/21    Authorization Type [employer] (used 7 visits prior POC)    Progress Note Due on Visit 10    OT Start Time 1230    OT Stop Time 1315    OT Time Calculation (min) 45 min    Activity Tolerance Patient tolerated treatment well    Behavior During Therapy WFL for tasks assessed/performed             Past Medical History:  Diagnosis Date   Anxiety    Depression    Diabetes mellitus without complication ([place])    type 2   Dyspnea    inhaler   GERD (gastroesophageal reflux disease)    History of kidney stones    HLD (hyperlipidemia)    Hypertension    Stroke ([place]) 09/2019   left arm and leg weak    Past Surgical History:  Procedure Laterality Date   EYE SURGERY Left    IR ANGIO EXTRACRAN SEL COM CAROTID INNOMINATE UNI L MOD SED  04/20/2020   IR ANGIO INTRA EXTRACRAN SEL COM CAROTID INNOMINATE UNI L MOD SED  06/21/2020   IR ANGIO VERTEBRAL SEL SUBCLAVIAN INNOMINATE UNI R MOD SED  06/21/2020   IR CT HEAD LTD  04/20/2020   IR INTRAVSC STENT CERV CAROTID W/EMB-PROT MOD SED INCL ANGIO  06/21/2020   IR PERCUTANEOUS ART THROMBECTOMY/INFUSION INTRACRANIAL INC DIAG ANGIO  04/20/2020   IR RADIOLOGIST EVAL & MGMT  06/05/2020   IR RADIOLOGIST EVAL & MGMT  07/18/2020   IR RADIOLOGIST EVAL & MGMT  10/23/2020   IR US GUIDE VASC ACCESS RIGHT  04/20/2020   IR US GUIDE VASC ACCESS RIGHT  06/21/2020   RADIOLOGY WITH ANESTHESIA N/A 04/20/2020   Procedure: IR WITH ANESTHESIA;  Surgeon: Luanne Bras, MD;  Location: [facility];  Service:  Radiology;  Laterality: N/A;   RADIOLOGY WITH ANESTHESIA N/A 06/21/2020   Procedure: RADIOLOGY WITH ANESTHESIA  STENTING;  Surgeon: Corrie Mckusick, DO;  Location: [facility];  Service: Anesthesiology;  Laterality: N/A;    There were no vitals filed for this visit.   Subjective Assessment - 12/11/20 1233     Subjective  Patient reports he is using biofreeze to help reduce shoulder pain    Pertinent History R MCA stroke.  PMH:  CKD, DM, malnutrition, HTN, polysubstance abuse, prior stroke 08/21 who presents with spastic left sided hemiplegia    Limitations Fall Risk, LUE Hemi    Currently in Pain? Yes    Pain Score 2     Pain Location Shoulder    Pain Orientation Left    Pain Descriptors / Indicators Aching;Sore    Pain Type Chronic pain    Pain Onset More than a month ago    Pain Frequency Intermittent    Aggravating Factors  lying down    Pain Relieving Factors position changes                          OT Treatments/Exercises (OP) - 12/11/20 0001  Neurological Re-education Exercises   Other Exercises 1 Neuromuscular reeducation to provide realignment and reduction of shoulder pain.  Patient with less pain when he lifted himself onto left arm to achieve sidelying.  Patient able to isolate elbow flexion and relaxation into elbow extension and slight pro/supination in this sidelying position.  Patient instructed to not wear edema glove if no edema present - glove now loose on his left hand.  Patient with increased digit flexion this session, but no active extension noted.  Pain at end of session 0/10.  Patient indicating "good stretch"                      OT Short Term Goals - 12/11/20 1413       OT SHORT TERM GOAL #1   Title Pt will be independent with new HEP for improving range of motion and decreasing pain in LUE    Baseline needs review from previous time    Time 4    Period Weeks    Status On-going    Target Date 01/02/21      OT SHORT TERM  GOAL #2   Title Patient will demonstrate sufficient shoulder range of motion and/or ability to lift arm sufficiently to apply deodorant and wash under left arm with pain no greater than 1-2/10    Baseline unable to lift arm sufficiently    Time 4    Period Weeks    Status On-going    Target Date 01/02/21      OT SHORT TERM GOAL #3   Title Pt will demonstrate 10% of active flexion and extension in LUE hand for increasing and progressing towards grasp/release    Baseline no volitional movement    Time 4    Period Weeks    Status On-going      OT SHORT TERM GOAL #4   Title Pt will verbalize understanding of splint wear and care instructions for increasing prolonged stretch in LUE hand.    Baseline has custom resting hand splint from previous sessions    Time 4    Period Weeks    Status On-going      OT SHORT TERM GOAL #5   Title Patient will demonstrate 90 degrees of passive shoulder flexion in LUE in preparation for forward reaching    Baseline ~70*    Time 4    Period Weeks    Status On-going               OT Long Term Goals - 12/11/20 1239       OT LONG TERM GOAL #1   Title Patient will complete updated HEP designed to improve functional use of LUE    Baseline No HEP    Time 8    Period Weeks    Status On-going      OT LONG TERM GOAL #2   Title Pt will verbalize understanding of adapted strategies and equipment PRN for increasing safety and independence with ADLs and IADLs (cutting food, vegetables, washing dishes, etc)    Baseline No current splints or slings    Time 8    Period Weeks    Status On-going      OT LONG TERM GOAL #3   Title Patient will demonstrate ability to cut food on plate    Baseline Unable to cut up food    Time 8    Period Weeks    Status On-going      OT LONG  TERM GOAL #4   Title Pt will demonstrate ability to stabilize a dish in LUE and wash with RUE    Baseline Unable to cut veggies    Time 8    Period Weeks    Status On-going       OT LONG TERM GOAL #5   Title Patient will demonstrate ability to button/zipper clothing as needed    Baseline Opts to wear clothing without fasteners    Time 8    Period Weeks    Status On-going                   Plan - 12/11/20 1413     Clinical Impression Statement Pt with less pain in left shoulder and beginning of active movement.    OT Occupational Profile and History Detailed Assessment- Review of Records and additional review of physical, cognitive, psychosocial history related to current functional performance    Occupational performance deficits (Please refer to evaluation for details): ADL's;IADL's;Rest and Sleep    Body Structure / Function / Physical Skills ADL;Dexterity;Flexibility;Muscle spasms;ROM;Strength;Vision;Tone;IADL;FMC;Edema;Coordination;Balance;Body mechanics;Decreased knowledge of precautions;Endurance;Improper spinal/pelvic alignment;Pain;Sensation;Decreased knowledge of use of DME;GMC;Mobility;Proprioception    Cognitive Skills Attention;Memory;Perception;Energy/Drive;Problem Solve;Safety Awareness    Rehab Potential Good    Clinical Decision Making Several treatment options, min-mod task modification necessary    Comorbidities Affecting Occupational Performance: May have comorbidities impacting occupational performance    Modification or Assistance to Complete Evaluation  Min-Moderate modification of tasks or assist with assess necessary to complete eval    OT Frequency 1x / week    OT Duration 8 weeks   12 visits over 8 weeks d/t any scheduling conflicts   OT Treatment/Interventions Self-care/ADL training;Therapeutic exercise;DME and/or AE instruction;Functional Mobility Training;Cognitive remediation/compensation;Balance training;Visual/perceptual remediation/compensation;Splinting;Manual Therapy;Neuromuscular education;Fluidtherapy;Electrical Stimulation;Aquatic Barista;Iontophoresis;Passive range of motion;Therapeutic  activities;Patient/family education    Plan supine manual therapy, self PROM, bed positioning LUE.    Consulted and Agree with Plan of Care Patient             Patient will benefit from skilled therapeutic intervention in order to improve the following deficits and impairments:   Body Structure / Function / Physical Skills: ADL, Dexterity, Flexibility, Muscle spasms, ROM, Strength, Vision, Tone, IADL, FMC, Edema, Coordination, Balance, Body mechanics, Decreased knowledge of precautions, Endurance, Improper spinal/pelvic alignment, Pain, Sensation, Decreased knowledge of use of DME, GMC, Mobility, Proprioception Cognitive Skills: Attention, Memory, Perception, Energy/Drive, Problem Solve, Safety Awareness     Visit Diagnosis: Spastic 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 (HCC)  Stiffness of left hand, not elsewhere classified  Chronic left shoulder pain  Muscle weakness (generalized)  Attention and concentration deficit  Left shoulder pain, unspecified chronicity  Unsteadiness on feet    Problem List Patient Active Problem List   Diagnosis Date Noted   Internal carotid artery stenosis, bilateral 06/21/2020   Slow transit constipation    Spastic hemiplegia affecting nondominant side (HCC)    Stage 3b chronic kidney disease ([place])    Essential hypertension    Controlled type 2 diabetes mellitus with hyperglycemia, without long-term current use of insulin ([place])    Protein-calorie malnutrition, severe 04/25/2020   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

## 2020-12-14 ENCOUNTER — Other Ambulatory Visit: Payer: Self-pay

## 2020-12-14 ENCOUNTER — Encounter: Payer: Self-pay | Admitting: Occupational Therapy

## 2020-12-14 ENCOUNTER — Ambulatory Visit: Payer: Federal, State, Local not specified - PPO

## 2020-12-14 ENCOUNTER — Ambulatory Visit: Payer: Federal, State, Local not specified - PPO | Admitting: Occupational Therapy

## 2020-12-14 DIAGNOSIS — R41840 Attention and concentration deficit: Secondary | ICD-10-CM

## 2020-12-14 DIAGNOSIS — R2681 Unsteadiness on feet: Secondary | ICD-10-CM

## 2020-12-14 DIAGNOSIS — I69351 Hemiplegia and hemiparesis following cerebral infarction affecting right dominant side: Secondary | ICD-10-CM

## 2020-12-14 DIAGNOSIS — M6281 Muscle weakness (generalized): Secondary | ICD-10-CM

## 2020-12-14 DIAGNOSIS — M25612 Stiffness of left shoulder, not elsewhere classified: Secondary | ICD-10-CM

## 2020-12-14 DIAGNOSIS — M25642 Stiffness of left hand, not elsewhere classified: Secondary | ICD-10-CM

## 2020-12-14 DIAGNOSIS — G8929 Other chronic pain: Secondary | ICD-10-CM

## 2020-12-14 DIAGNOSIS — I69354 Hemiplegia and hemiparesis following cerebral infarction affecting left non-dominant side: Secondary | ICD-10-CM

## 2020-12-14 DIAGNOSIS — M25512 Pain in left shoulder: Secondary | ICD-10-CM
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
## 2020-12-16 NOTE — Therapy (Signed)
[facility] [address] [place], [place], [address] Phone: [phone]   Fax:  [phone]  Physical Therapy Treatment  Patient Details  Name: Bobby Branch MRN: [id number] Date of Birth: 1960-09-01 Referring Provider (PT): Dr. Charlott Rakes   Encounter Date: 12/14/2020    12/14/20 1524  PT Visits / Re-Eval  Visit Number 2  Number of Visits 13  Date for PT Re-Evaluation 01/16/21  Authorization  Authorization Type [employer] Josem Kaufmann sent for 12 visits)  Authorization Time Period [employer] approved 12 visits total combined PT/OT/ST. 12th visit reached on 08/30/20. request for more visits/auth has been submitted on this date.  Authorization - Visit Number 0  Authorization - Number of Visits 12  Progress Note Due on Visit 13  PT Time Calculation  PT Start Time 1530  PT Stop Time 1610  PT Time Calculation (min) 40 min  PT - End of Session  Equipment Utilized During Treatment Gait belt  Activity Tolerance Patient tolerated treatment well  Behavior During Therapy WFL for tasks assessed/performed    Past Medical History:  Diagnosis Date   Anxiety    Depression    Diabetes mellitus without complication (HCC)    type 2   Dyspnea    inhaler   GERD (gastroesophageal reflux disease)    History of kidney stones    HLD (hyperlipidemia)    Hypertension    Stroke ([place]) 09/2019   left arm and leg weak    Past Surgical History:  Procedure Laterality Date   EYE SURGERY Left    IR ANGIO EXTRACRAN SEL COM CAROTID INNOMINATE UNI L MOD SED  04/20/2020   IR ANGIO INTRA EXTRACRAN SEL COM CAROTID INNOMINATE UNI L MOD SED  06/21/2020   IR ANGIO VERTEBRAL SEL SUBCLAVIAN INNOMINATE UNI R MOD SED  06/21/2020   IR CT HEAD LTD  04/20/2020   IR INTRAVSC STENT CERV CAROTID W/EMB-PROT MOD SED INCL ANGIO  06/21/2020   IR PERCUTANEOUS ART THROMBECTOMY/INFUSION INTRACRANIAL INC DIAG ANGIO  04/20/2020   IR RADIOLOGIST EVAL & MGMT   06/05/2020   IR RADIOLOGIST EVAL & MGMT  07/18/2020   IR RADIOLOGIST EVAL & MGMT  10/23/2020   IR US GUIDE VASC ACCESS RIGHT  04/20/2020   IR US GUIDE VASC ACCESS RIGHT  06/21/2020   RADIOLOGY WITH ANESTHESIA N/A 04/20/2020   Procedure: IR WITH ANESTHESIA;  Surgeon: Luanne Bras, MD;  Location: [facility];  Service: Radiology;  Laterality: N/A;   RADIOLOGY WITH ANESTHESIA N/A 06/21/2020   Procedure: RADIOLOGY WITH ANESTHESIA  STENTING;  Surgeon: Corrie Mckusick, DO;  Location: [facility];  Service: Anesthesiology;  Laterality: N/A;    There were no vitals filed for this visit.     12/14/20 1532  Transfers  Transfers Sit to Stand;Stand to Sit  Sit to Stand 6: Modified independent (Device/Increase time)  Stand to Sit 7: Independent  Number of Reps 10 reps  Comments Min A from PT to perfrom w/o UE support  Ambulation/Gait  Ambulation/Gait Yes  Ambulation/Gait Assistance 5: Supervision;6: Modified independent (Device/Increase time)  Ambulation/Gait Assistance Details performed after stepping tasks  Ambulation Distance (Feet) 200 Feet  Gait Pattern Decreased step length - left;Step-through pattern;Decreased stance time - left;Decreased stride length;Decreased hip/knee flexion - left;Decreased dorsiflexion - left;Decreased weight shift to left;Left foot flat;Left flexed knee in stance;Poor foot clearance - left  Ambulation Surface Level;Indoor  Gait Comments Increase L ankle ROM into DFnto terminal stance phase.  Lumbar Exercises: Supine  Heel Slides Limitations  Heel  Slides Limitations R x30 with TCs  Knee/Hip Exercises: Aerobic  Other Aerobic Scifit seat 16 legas only L1 x 22min  Knee/Hip Exercises: Supine  Other Supine Knee/Hip Exercises LLE hip fallouts 30x with manual facilitation  Other Supine Knee/Hip Exercises marching LLE 30x with PT facilitation  Heel Slides Strengthening;Left;Limitations  Heel Slides Limitations 30x with PT facilitation     12/14/20 0001  Balance Exercises: Standing   Stepping Strategy Anterior;Lateral;UE support;Limitations  Stepping Strategy Limitations RUE support 6'block, stepping with LLE and WS with PT positioning foot in neutral  Rockerboard Anterior/posterior;UE support;Limitations  Rockerboard Limitations rocking AP with PT facilitation into DF, RUE support                               PT Short Term Goals - 12/06/20 1520       PT SHORT TERM GOAL #1   Title Patient to demo I in initial HEP    Time 4    Period Weeks    Status New    Target Date 01/02/21      PT SHORT TERM GOAL #2   Title Pt will be able to stand up without UE support to improve functional strength    Baseline Requires 1 UE support    Time 4    Period Weeks    Status New    Target Date 01/02/21      PT SHORT TERM GOAL #3   Title --    Baseline --    Time --    Period --    Status --    Target Date 08/16/20      PT SHORT TERM GOAL #4   Title --    Baseline --    Time --    Period --    Status --    Target Date 08/16/20      PT SHORT TERM GOAL #5   Title --    Baseline --    Time --    Period --    Status --    Target Date --               PT Long Term Goals - 12/05/20 2355       PT LONG TERM GOAL #1   Title Patient will demo 5x sit to stand with one UE support <18 seconds to improve overall functional strength    Baseline 29 sec with one UE support (12/05/20)    Time 6    Period Weeks    Status New    Target Date 01/16/21      PT LONG TERM GOAL #2   Title Pt will demo gait speed of at least 0.23m/s to improve community ambulation    Baseline 0.48 m/s (12/05/20)    Time 6    Period Weeks    Status New    Target Date 01/16/21      PT LONG TERM GOAL #3   Title Pt will dem FGA score of 20/30 to improve fucntional gait and balance    Baseline 13/30 (12/05/20)    Time 6    Period Weeks    Status New    Target Date 01/16/21                    Patient will benefit from skilled therapeutic  intervention in order to improve the following deficits and impairments:  Abnormal gait, Decreased coordination, Difficulty walking,  Decreased endurance, Decreased activity tolerance, Decreased balance, Decreased mobility, Decreased strength, Decreased range of motion, Impaired flexibility, Increased fascial restricitons, Hypomobility, Impaired sensation, Impaired tone, Impaired UE functional use, Improper body mechanics, Postural dysfunction, Pain  Visit Diagnosis: Spastic 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 [facility])  Muscle weakness (generalized)  Unsteadiness on feet     Problem List Patient Active Problem List   Diagnosis Date Noted   Internal carotid artery stenosis, bilateral 06/21/2020   Slow transit constipation    Spastic hemiplegia affecting nondominant side (HCC)    Stage 3b chronic kidney disease ([facility])    Essential hypertension    Controlled type 2 diabetes mellitus with hyperglycemia, without long-term current use of insulin (HCC)    Protein-calorie malnutrition, severe 04/25/2020   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 [place], [place], [address] Phone: [phone]   Fax:  [phone]  Name: Bobby Branch MRN: [id number] Date of Birth: Feb 05, 1960

## 2020-12-18 ENCOUNTER — Other Ambulatory Visit: Payer: Self-pay

## 2020-12-18 ENCOUNTER — Ambulatory Visit: Payer: Federal, State, Local not specified - PPO

## 2020-12-18 ENCOUNTER — Ambulatory Visit: Payer: Federal, State, Local not specified - PPO | Admitting: Occupational Therapy

## 2020-12-18 DIAGNOSIS — I69351 Hemiplegia and hemiparesis following cerebral infarction affecting right dominant side: Secondary | ICD-10-CM | POA: Diagnosis not present

## 2020-12-18 DIAGNOSIS — M6281 Muscle weakness (generalized): Secondary | ICD-10-CM

## 2020-12-18 DIAGNOSIS — R2689 Other abnormalities of gait and mobility: Secondary | ICD-10-CM

## 2020-12-18 DIAGNOSIS — I69354 Hemiplegia and hemiparesis following cerebral infarction affecting left non-dominant side: Secondary | ICD-10-CM

## 2020-12-18 DIAGNOSIS — R2681 Unsteadiness on feet: Secondary | ICD-10-CM

## 2020-12-18 NOTE — Therapy (Signed)
[facility] [address] [place], [place], [address] Phone: [phone]   Fax:  [phone]  Physical Therapy Treatment  Patient Details  Name: Bobby Branch MRN: [id number] Date of Birth: 1960/03/02 Referring Provider (PT): Dr. Charlott Rakes   Encounter Date: 12/18/2020   PT End of Session - 12/18/20 1151     Visit Number 3    Number of Visits 13    Date for PT Re-Evaluation 01/16/21    Authorization Type [employer] Josem Kaufmann sent for 12 visits)    Authorization Time Period [employer] approved 12 visits total combined PT/OT/ST. 12th visit reached on 08/30/20. request for more visits/auth has been submitted on this date.    Authorization - Visit Number 0    Authorization - Number of Visits 12    Progress Note Due on Visit 13    PT Start Time 1100    PT Stop Time 1145    PT Time Calculation (min) 45 min    Equipment Utilized During Treatment Gait belt    Activity Tolerance Patient tolerated treatment well    Behavior During Therapy WFL for tasks assessed/performed             Past Medical History:  Diagnosis Date   Anxiety    Depression    Diabetes mellitus without complication (HCC)    type 2   Dyspnea    inhaler   GERD (gastroesophageal reflux disease)    History of kidney stones    HLD (hyperlipidemia)    Hypertension    Stroke ([facility]) 09/2019   left arm and leg weak    Past Surgical History:  Procedure Laterality Date   EYE SURGERY Left    IR ANGIO EXTRACRAN SEL COM CAROTID INNOMINATE UNI L MOD SED  04/20/2020   IR ANGIO INTRA EXTRACRAN SEL COM CAROTID INNOMINATE UNI L MOD SED  06/21/2020   IR ANGIO VERTEBRAL SEL SUBCLAVIAN INNOMINATE UNI R MOD SED  06/21/2020   IR CT HEAD LTD  04/20/2020   IR INTRAVSC STENT CERV CAROTID W/EMB-PROT MOD SED INCL ANGIO  06/21/2020   IR PERCUTANEOUS ART THROMBECTOMY/INFUSION INTRACRANIAL INC DIAG ANGIO  04/20/2020   IR RADIOLOGIST EVAL & MGMT  06/05/2020   IR RADIOLOGIST  EVAL & MGMT  07/18/2020   IR RADIOLOGIST EVAL & MGMT  10/23/2020   IR US GUIDE VASC ACCESS RIGHT  04/20/2020   IR US GUIDE VASC ACCESS RIGHT  06/21/2020   RADIOLOGY WITH ANESTHESIA N/A 04/20/2020   Procedure: IR WITH ANESTHESIA;  Surgeon: Luanne Bras, MD;  Location: [facility];  Service: Radiology;  Laterality: N/A;   RADIOLOGY WITH ANESTHESIA N/A 06/21/2020   Procedure: RADIOLOGY WITH ANESTHESIA  STENTING;  Surgeon: Corrie Mckusick, DO;  Location: [facility];  Service: Anesthesiology;  Laterality: N/A;    There were no vitals filed for this visit.   Subjective Assessment - 12/18/20 1109     Subjective No changes to note, felt LLE was 'looser" after last session    Pertinent History 60 y.o. 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 s/p emergent cerebral angiogram with  successful thrombectomy with small volume SAH sylvian sulci; cocaine +    Limitations Standing    How long can you sit comfortably? unlimited    How long can you stand comfortably? <5 min    How long  can you walk comfortably? <10 min    Patient Stated Goals To be able to walk again by myself    Pain Onset More than a month ago                               [facility] Adult PT Treatment/Exercise - 12/18/20 0001       Transfers   Transfers Sit to Stand;Stand to Sit    Sit to Stand 6: Modified independent (Device/Increase time)    Stand to Sit 6: Modified independent (Device/Increase time)    Number of Reps 10 reps;2 sets    Comments facilitated WS fwd      Ambulation/Gait   Ambulation/Gait Yes    Ambulation/Gait Assistance 5: Supervision    Ambulation Distance (Feet) 200 Feet    Gait Pattern Decreased step length - left;Step-through pattern;Decreased stance time - left;Decreased stride length;Decreased hip/knee flexion - left;Decreased  dorsiflexion - left;Decreased weight shift to left;Left foot flat;Left flexed knee in stance;Poor foot clearance - left    Ambulation Surface Level;Indoor      Knee/Hip Exercises: Aerobic   Other Aerobic Scifit seat 16 legs only L2 x 8 min      Knee/Hip Exercises: Supine   Other Supine Knee/Hip Exercises LLE hip fallouts 30x with manual facilitation    Other Supine Knee/Hip Exercises marching LLE 30x with PT facilitation, 5# on ball of foot to facilitate DF      Ankle Exercises: Stretches   Gastroc Stretch 1 rep;Limitations    Gastroc Stretch Limitations 2 min static hold                 Balance Exercises - 12/18/20 0001       Balance Exercises: Standing   Stepping Strategy Anterior;Limitations    Stepping Strategy Limitations 2" block, no UE support but CGA stepping and WS onto LLE    Other Standing Exercises deadlift with 10# KB to promote fwd weight shift, 2x10                  PT Short Term Goals - 12/06/20 1520       PT SHORT TERM GOAL #1   Title Patient to demo I in initial HEP    Time 4    Period Weeks    Status New    Target Date 01/02/21      PT SHORT TERM GOAL #2   Title Pt will be able to stand up without UE support to improve functional strength    Baseline Requires 1 UE support    Time 4    Period Weeks    Status New    Target Date 01/02/21      PT SHORT TERM GOAL #3   Title --    Baseline --    Time --    Period --    Status --    Target Date 08/16/20      PT SHORT TERM GOAL #4   Title --    Baseline --    Time --    Period --    Status --    Target Date 08/16/20      PT SHORT TERM GOAL #5   Title --    Baseline --    Time --    Period --    Status --    Target Date --  PT Long Term Goals - 12/05/20 2355       PT LONG TERM GOAL #1   Title Patient will demo 5x sit to stand with one UE support <18 seconds to improve overall functional strength    Baseline 29 sec with one UE support (12/05/20)     Time 6    Period Weeks    Status New    Target Date 01/16/21      PT LONG TERM GOAL #2   Title Pt will demo gait speed of at least 0.92m/s to improve community ambulation    Baseline 0.48 m/s (12/05/20)    Time 6    Period Weeks    Status New    Target Date 01/16/21      PT LONG TERM GOAL #3   Title Pt will dem FGA score of 20/30 to improve fucntional gait and balance    Baseline 13/30 (12/05/20)    Time 6    Period Weeks    Status New    Target Date 01/16/21                   Plan - 12/18/20 1154     Clinical Impression Statement Continued gait training focused on posture and increased DF and hip flexion during swing phase.  Added weight to ball of foot during supine marching to facilitate stepping pattern.  performed step up and fwd weight shifting of LLE onto 2" block w/o UE support with verbal and tactile cues provided for posture and fwd WS of pelvis.    Personal Factors and Comorbidities Comorbidity 2;Past/Current Experience;Time since onset of injury/illness/exacerbation    Comorbidities CVA, DM    Examination-Activity Limitations Locomotion Level;Transfers;Squat;Stairs;Stand;Lift;Dressing    Examination-Participation Restrictions Cleaning;Community Activity;Laundry;Meal Prep;Shop;Yard Work    Stability/Clinical Decision Making Stable/Uncomplicated    Rehab Potential Good    PT Frequency 2x / week    PT Duration 6 weeks    PT Treatment/Interventions ADLs/Self Care Home Management;DME Instruction;Gait training;Stair training;Functional mobility training;Therapeutic activities;Therapeutic exercise;Balance training;Neuromuscular re-education;Patient/family education;Cryotherapy;Moist Heat;Electrical Stimulation;Manual techniques;Orthotic Fit/Training;Passive range of motion;Energy conservation;Joint Manipulations    PT Next Visit Plan Work on [place], work on hip flexion/knee flexion synnergy pattern with rapid movement for fast twitch mm fibers, work on sit  to stand without UE support to improve use of L LE, fwd WS to increase confidence when transferring    PT Home Exercise Plan Supine heel slides    Consulted and Agree with Plan of Care Patient             Patient will benefit from skilled therapeutic intervention in order to improve the following deficits and impairments:  Abnormal gait, Decreased coordination, Difficulty walking, Decreased endurance, Decreased activity tolerance, Decreased balance, Decreased mobility, Decreased strength, Decreased range of motion, Impaired flexibility, Increased fascial restricitons, Hypomobility, Impaired sensation, Impaired tone, Impaired UE functional use, Improper body mechanics, Postural dysfunction, Pain  Visit Diagnosis: Spastic 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 (HCC)  Unsteadiness on feet  Other abnormalities of gait and mobility  Muscle weakness (generalized)     Problem List Patient Active Problem List   Diagnosis Date Noted   Internal carotid artery stenosis, bilateral 06/21/2020   Slow transit constipation    Spastic hemiplegia affecting nondominant side (HCC)    Stage 3b chronic kidney disease ([place])    Essential hypertension    Controlled type 2 diabetes mellitus with hyperglycemia, without long-term current use of insulin (HCC)  Protein-calorie malnutrition, severe 04/25/2020   Right middle cerebral artery stroke (HCC) 04/23/2020   Acute right MCA stroke ([place]) 04/20/2020    Lanice Shirts, PT 12/18/2020, 11:58 AM  [facility] [address] [place], [place], [address] Phone: [phone]   Fax:  [phone]  Name: Bobby Branch MRN: [id number] Date of Birth: Dec 20, 1960

## 2020-12-20 ENCOUNTER — Ambulatory Visit: Payer: Federal, State, Local not specified - PPO | Admitting: Occupational Therapy

## 2020-12-20 ENCOUNTER — Ambulatory Visit: Payer: Federal, State, Local not specified - PPO

## 2020-12-20 ENCOUNTER — Encounter: Payer: Self-pay | Admitting: Occupational Therapy

## 2020-12-20 ENCOUNTER — Other Ambulatory Visit: Payer: Self-pay

## 2020-12-20 DIAGNOSIS — M25642 Stiffness of left hand, not elsewhere classified: Secondary | ICD-10-CM

## 2020-12-20 DIAGNOSIS — M6281 Muscle weakness (generalized): Secondary | ICD-10-CM

## 2020-12-20 DIAGNOSIS — I69354 Hemiplegia and hemiparesis following cerebral infarction affecting left non-dominant side: Secondary | ICD-10-CM

## 2020-12-20 DIAGNOSIS — R2681 Unsteadiness on feet: Secondary | ICD-10-CM

## 2020-12-20 DIAGNOSIS — M25612 Stiffness of left shoulder, not elsewhere classified: Secondary | ICD-10-CM

## 2020-12-20 DIAGNOSIS — M25512 Pain in left shoulder: Secondary | ICD-10-CM

## 2020-12-20 DIAGNOSIS — I69351 Hemiplegia and hemiparesis following cerebral infarction affecting right dominant side: Secondary | ICD-10-CM | POA: Diagnosis not present

## 2020-12-20 NOTE — Therapy (Signed)
[facility] [address] [place], [place], [address] Phone: [phone]   Fax:  [phone]  Physical Therapy Treatment  Patient Details  Name: Bobby Branch MRN: [id number] Date of Birth: 05-09-1960 Referring Provider (PT): Dr. Charlott Rakes   Encounter Date: 12/20/2020   PT End of Session - 12/20/20 1415     Visit Number 4    Number of Visits 13    Date for PT Re-Evaluation 01/16/21    Authorization Type [employer] Josem Kaufmann sent for 12 visits)    Authorization Time Period [employer] approved 12 visits total combined PT/OT/ST. 12th visit reached on 08/30/20. request for more visits/auth has been submitted on this date.    Authorization - Visit Number 0    Authorization - Number of Visits 12    Progress Note Due on Visit 13    PT Start Time 2863    PT Stop Time 1445    PT Time Calculation (min) 30 min    Equipment Utilized During Treatment Gait belt    Activity Tolerance Patient tolerated treatment well    Behavior During Therapy WFL for tasks assessed/performed             Past Medical History:  Diagnosis Date   Anxiety    Depression    Diabetes mellitus without complication (HCC)    type 2   Dyspnea    inhaler   GERD (gastroesophageal reflux disease)    History of kidney stones    HLD (hyperlipidemia)    Hypertension    Stroke ([facility]) 09/2019   left arm and leg weak    Past Surgical History:  Procedure Laterality Date   EYE SURGERY Left    IR ANGIO EXTRACRAN SEL COM CAROTID INNOMINATE UNI L MOD SED  04/20/2020   IR ANGIO INTRA EXTRACRAN SEL COM CAROTID INNOMINATE UNI L MOD SED  06/21/2020   IR ANGIO VERTEBRAL SEL SUBCLAVIAN INNOMINATE UNI R MOD SED  06/21/2020   IR CT HEAD LTD  04/20/2020   IR INTRAVSC STENT CERV CAROTID W/EMB-PROT MOD SED INCL ANGIO  06/21/2020   IR PERCUTANEOUS ART THROMBECTOMY/INFUSION INTRACRANIAL INC DIAG ANGIO  04/20/2020   IR RADIOLOGIST EVAL & MGMT  06/05/2020   IR RADIOLOGIST  EVAL & MGMT  07/18/2020   IR RADIOLOGIST EVAL & MGMT  10/23/2020   IR US GUIDE VASC ACCESS RIGHT  04/20/2020   IR US GUIDE VASC ACCESS RIGHT  06/21/2020   RADIOLOGY WITH ANESTHESIA N/A 04/20/2020   Procedure: IR WITH ANESTHESIA;  Surgeon: Luanne Bras, MD;  Location: [facility];  Service: Radiology;  Laterality: N/A;   RADIOLOGY WITH ANESTHESIA N/A 06/21/2020   Procedure: RADIOLOGY WITH ANESTHESIA  STENTING;  Surgeon: Corrie Mckusick, DO;  Location: [facility];  Service: Anesthesiology;  Laterality: N/A;    There were no vitals filed for this visit.                      [facility] Adult PT Treatment/Exercise - 12/20/20 0001       Transfers   Transfers Sit to Stand;Stand to Sit    Sit to Stand 6: Modified independent (Device/Increase time)    Stand to Sit 6: Modified independent (Device/Increase time)    Number of Reps 10 reps    Comments from [employer]      Ambulation/Gait   Ambulation/Gait Yes    Ambulation/Gait Assistance 5: Supervision    Ambulation Distance (Feet) 100 Feet    Gait Pattern Decreased step length -  left;Step-through pattern;Decreased stance time - left;Decreased stride length;Decreased hip/knee flexion - left;Decreased dorsiflexion - left;Decreased weight shift to left;Left foot flat;Left flexed knee in stance;Poor foot clearance - left    Ambulation Surface Level;Indoor    Stairs Yes    Stairs Assistance 5: Supervision;4: Min guard    Stair Management Technique One rail Right;Step to pattern    Number of Stairs 16    Height of Stairs 6      Knee/Hip Exercises: Supine   Other Supine Knee/Hip Exercises LLE hip fallouts 30x with manual facilitation                 Balance Exercises - 12/20/20 0001       Balance Exercises: Standing   Stepping Strategy Anterior;Lateral;10 reps;Limitations    Stepping Strategy Limitations 4" block, 10x ant/lat with CGA and L knee blocked as well as fwd stepping with RLE    Other Standing Exercises deadlift with 10# KB to  promote fwd weight shift, 10x                  PT Short Term Goals - 12/06/20 1520       PT SHORT TERM GOAL #1   Title Patient to demo I in initial HEP    Time 4    Period Weeks    Status New    Target Date 01/02/21      PT SHORT TERM GOAL #2   Title Pt will be able to stand up without UE support to improve functional strength    Baseline Requires 1 UE support    Time 4    Period Weeks    Status New    Target Date 01/02/21      PT SHORT TERM GOAL #3   Title --    Baseline --    Time --    Period --    Status --    Target Date 08/16/20      PT SHORT TERM GOAL #4   Title --    Baseline --    Time --    Period --    Status --    Target Date 08/16/20      PT SHORT TERM GOAL #5   Title --    Baseline --    Time --    Period --    Status --    Target Date --               PT Long Term Goals - 12/05/20 2355       PT LONG TERM GOAL #1   Title Patient will demo 5x sit to stand with one UE support <18 seconds to improve overall functional strength    Baseline 29 sec with one UE support (12/05/20)    Time 6    Period Weeks    Status New    Target Date 01/16/21      PT LONG TERM GOAL #2   Title Pt will demo gait speed of at least 0.54m/s to improve community ambulation    Baseline 0.48 m/s (12/05/20)    Time 6    Period Weeks    Status New    Target Date 01/16/21      PT LONG TERM GOAL #3   Title Pt will dem FGA score of 20/30 to improve fucntional gait and balance    Baseline 13/30 (12/05/20)    Time 6    Period Weeks    Status New  Target Date 01/16/21                   Plan - 12/20/20 1454     Clinical Impression Statement Seesion limited due to transportation delays.  Focus of session was continied gait training emphasizing L heek strike and TKE.  Able to negotiate a full flight of stairs with R rail assist and step to pattern.  L knee braced into extension when performing RLE step ups incorporating RUE flexion to facilitate  posture.    Personal Factors and Comorbidities Comorbidity 2;Past/Current Experience;Time since onset of injury/illness/exacerbation    Comorbidities CVA, DM    Examination-Activity Limitations Locomotion Level;Transfers;Squat;Stairs;Stand;Lift;Dressing    Examination-Participation Restrictions Cleaning;Community Activity;Laundry;Meal Prep;Shop;Yard Work    Stability/Clinical Decision Making Stable/Uncomplicated    Rehab Potential Good    PT Frequency 2x / week    PT Duration 6 weeks    PT Treatment/Interventions ADLs/Self Care Home Management;DME Instruction;Gait training;Stair training;Functional mobility training;Therapeutic activities;Therapeutic exercise;Balance training;Neuromuscular re-education;Patient/family education;Cryotherapy;Moist Heat;Electrical Stimulation;Manual techniques;Orthotic Fit/Training;Passive range of motion;Energy conservation;Joint Manipulations    PT Next Visit Plan Work on [place], work on hip flexion/knee flexion synnergy pattern with rapid movement for fast twitch mm fibers, work on sit to stand without UE support to improve use of L LE, fwd WS to increase confidence when transferring, focu son LLE TKE and heel strike    PT Home Exercise Plan Supine heel slides    Consulted and Agree with Plan of Care Patient             Patient will benefit from skilled therapeutic intervention in order to improve the following deficits and impairments:  Abnormal gait, Decreased coordination, Difficulty walking, Decreased endurance, Decreased activity tolerance, Decreased balance, Decreased mobility, Decreased strength, Decreased range of motion, Impaired flexibility, Increased fascial restricitons, Hypomobility, Impaired sensation, Impaired tone, Impaired UE functional use, Improper body mechanics, Postural dysfunction, Pain  Visit Diagnosis: Spastic 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 (HCC)  Unsteadiness on feet  Muscle  weakness (generalized)  Other abnormalities of gait and mobility     Problem List Patient Active Problem List   Diagnosis Date Noted   Internal carotid artery stenosis, bilateral 06/21/2020   Slow transit constipation    Spastic hemiplegia affecting nondominant side (HCC)    Stage 3b chronic kidney disease ([facility])    Essential hypertension    Controlled type 2 diabetes mellitus with hyperglycemia, without long-term current use of insulin (HCC)    Protein-calorie malnutrition, severe 04/25/2020   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 [place], [place], [address] Phone: [phone]   Fax:  [phone]  Name: Bobby Branch MRN: [id number] Date of Birth: 09-23-1960

## 2020-12-20 NOTE — Therapy (Signed)
[facility] [address] [place], [place], [address] Phone: [phone]   Fax:  [phone]  Occupational Therapy Treatment  Patient Details  Name: Bobby Branch MRN: [id number] Date of Birth: 14-Jun-1960 Referring Provider (OT): Charlott Rakes, MD   Encounter Date: 12/20/2020   OT End of Session - 12/20/20 1717     Visit Number 4    Number of Visits 13    Date for OT Re-Evaluation 01/30/21    Authorization Type [employer] (used 7 visits prior POC)    Progress Note Due on Visit 10    OT Start Time 1315    OT Stop Time 1400    OT Time Calculation (min) 45 min    Activity Tolerance Patient tolerated treatment well    Behavior During Therapy WFL for tasks assessed/performed             Past Medical History:  Diagnosis Date   Anxiety    Depression    Diabetes mellitus without complication ([facility])    type 2   Dyspnea    inhaler   GERD (gastroesophageal reflux disease)    History of kidney stones    HLD (hyperlipidemia)    Hypertension    Stroke ([facility]) 09/2019   left arm and leg weak    Past Surgical History:  Procedure Laterality Date   EYE SURGERY Left    IR ANGIO EXTRACRAN SEL COM CAROTID INNOMINATE UNI L MOD SED  04/20/2020   IR ANGIO INTRA EXTRACRAN SEL COM CAROTID INNOMINATE UNI L MOD SED  06/21/2020   IR ANGIO VERTEBRAL SEL SUBCLAVIAN INNOMINATE UNI R MOD SED  06/21/2020   IR CT HEAD LTD  04/20/2020   IR INTRAVSC STENT CERV CAROTID W/EMB-PROT MOD SED INCL ANGIO  06/21/2020   IR PERCUTANEOUS ART THROMBECTOMY/INFUSION INTRACRANIAL INC DIAG ANGIO  04/20/2020   IR RADIOLOGIST EVAL & MGMT  06/05/2020   IR RADIOLOGIST EVAL & MGMT  07/18/2020   IR RADIOLOGIST EVAL & MGMT  10/23/2020   IR US GUIDE VASC ACCESS RIGHT  04/20/2020   IR US GUIDE VASC ACCESS RIGHT  06/21/2020   RADIOLOGY WITH ANESTHESIA N/A 04/20/2020   Procedure: IR WITH ANESTHESIA;  Surgeon: Luanne Bras, MD;  Location: [facility];  Service:  Radiology;  Laterality: N/A;   RADIOLOGY WITH ANESTHESIA N/A 06/21/2020   Procedure: RADIOLOGY WITH ANESTHESIA  STENTING;  Surgeon: Corrie Mckusick, DO;  Location: [facility];  Service: Anesthesiology;  Laterality: N/A;    There were no vitals filed for this visit.                 OT Treatments/Exercises (OP) - 12/20/20 1713       Neurological Re-education Exercises   Other Exercises 1 Neuromuscular reeducation to address pain in left shoulder and postural control.  Patient requires cueing and facilitation to remain upright in seated and standing.  Worked to realign left shoulder girdle and then used body on arm motion to reduce tension in aligned shoulder girdle.  Patient able to isolate elbow flexion (midrange) and could allow elbow extension thru active relaxation of elbow flexors (vs active tricep contraction)   Patient today able to isolate forearm pronation inmid range elbow position while in sidelying.  Assisted patient to side sit position to increase demand - body on arm, and increase stretch to left shoulder capsule.  Patient reported decreased pain (from 4-2/10) at end of session.  OT Short Term Goals - 12/20/20 1718       OT SHORT TERM GOAL #1   Title Pt will be independent with new HEP for improving range of motion and decreasing pain in LUE    Baseline needs review from previous time    Time 4    Period Weeks    Status On-going    Target Date 01/02/21      OT SHORT TERM GOAL #2   Title Patient will demonstrate sufficient shoulder range of motion and/or ability to lift arm sufficiently to apply deodorant and wash under left arm with pain no greater than 1-2/10    Baseline unable to lift arm sufficiently    Time 4    Period Weeks    Status On-going    Target Date 01/02/21      OT SHORT TERM GOAL #3   Title Pt will demonstrate 10% of active flexion and extension in LUE hand for increasing and progressing towards grasp/release     Baseline no volitional movement    Time 4    Period Weeks    Status On-going      OT SHORT TERM GOAL #4   Title Pt will verbalize understanding of splint wear and care instructions for increasing prolonged stretch in LUE hand.    Baseline has custom resting hand splint from previous sessions    Time 4    Period Weeks    Status On-going      OT SHORT TERM GOAL #5   Title Patient will demonstrate 90 degrees of passive shoulder flexion in LUE in preparation for forward reaching    Baseline ~70*    Time 4    Period Weeks    Status On-going               OT Long Term Goals - 12/20/20 1718       OT LONG TERM GOAL #1   Title Patient will complete updated HEP designed to improve functional use of LUE    Baseline No HEP    Time 8    Period Weeks    Status On-going      OT LONG TERM GOAL #2   Title Pt will verbalize understanding of adapted strategies and equipment PRN for increasing safety and independence with ADLs and IADLs (cutting food, vegetables, washing dishes, etc)    Baseline No current splints or slings    Time 8    Period Weeks    Status On-going      OT LONG TERM GOAL #3   Title Patient will demonstrate ability to cut food on plate    Baseline Unable to cut up food    Time 8    Period Weeks    Status On-going      OT LONG TERM GOAL #4   Title Pt will demonstrate ability to stabilize a dish in LUE and wash with RUE    Baseline Unable to cut veggies    Time 8    Period Weeks    Status On-going      OT LONG TERM GOAL #5   Title Patient will demonstrate ability to button/zipper clothing as needed    Baseline Opts to wear clothing without fasteners    Time 8    Period Weeks    Status On-going                   Plan - 12/20/20 1717     Clinical Impression  Statement Pt with improving ability to be upright in sitting and standing which can help with shoulder alignment and reduce pain    OT Occupational Profile and History Detailed Assessment-  Review of Records and additional review of physical, cognitive, psychosocial history related to current functional performance    Occupational performance deficits (Please refer to evaluation for details): ADL's;IADL's;Rest and Sleep    Body Structure / Function / Physical Skills ADL;Dexterity;Flexibility;Muscle spasms;ROM;Strength;Vision;Tone;IADL;FMC;Edema;Coordination;Balance;Body mechanics;Decreased knowledge of precautions;Endurance;Improper spinal/pelvic alignment;Pain;Sensation;Decreased knowledge of use of DME;GMC;Mobility;Proprioception    Cognitive Skills Attention;Memory;Perception;Energy/Drive;Problem Solve;Safety Awareness    Rehab Potential Good    Clinical Decision Making Several treatment options, min-mod task modification necessary    Comorbidities Affecting Occupational Performance: May have comorbidities impacting occupational performance    Modification or Assistance to Complete Evaluation  Min-Moderate modification of tasks or assist with assess necessary to complete eval    OT Frequency 1x / week    OT Duration 8 weeks   12 visits over 8 weeks d/t any scheduling conflicts   OT Treatment/Interventions Self-care/ADL training;Therapeutic exercise;DME and/or AE instruction;Functional Mobility Training;Cognitive remediation/compensation;Balance training;Visual/perceptual remediation/compensation;Splinting;Manual Therapy;Neuromuscular education;Fluidtherapy;Electrical Stimulation;Aquatic Barista;Iontophoresis;Passive range of motion;Therapeutic activities;Patient/family education    Plan supine manual therapy, self PROM, bed positioning LUE.    Consulted and Agree with Plan of Care Patient             Patient will benefit from skilled therapeutic intervention in order to improve the following deficits and impairments:   Body Structure / Function / Physical Skills: ADL, Dexterity, Flexibility, Muscle spasms, ROM, Strength, Vision, Tone, IADL, FMC, Edema,  Coordination, Balance, Body mechanics, Decreased knowledge of precautions, Endurance, Improper spinal/pelvic alignment, Pain, Sensation, Decreased knowledge of use of DME, GMC, Mobility, Proprioception Cognitive Skills: Attention, Memory, Perception, Energy/Drive, Problem Solve, Safety Awareness     Visit Diagnosis: Spastic 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 (HCC)  Unsteadiness on feet  Muscle weakness (generalized)  Chronic left shoulder pain  Stiffness of left hand, not elsewhere classified  Stiffness of left shoulder, not elsewhere classified    Problem List Patient Active Problem List   Diagnosis Date Noted   Internal carotid artery stenosis, bilateral 06/21/2020   Slow transit constipation    Spastic hemiplegia affecting nondominant side (HCC)    Stage 3b chronic kidney disease ([facility])    Essential hypertension    Controlled type 2 diabetes mellitus with hyperglycemia, without long-term current use of insulin ([facility])    Protein-calorie malnutrition, severe 04/25/2020   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

## 2020-12-25 ENCOUNTER — Other Ambulatory Visit: Payer: Self-pay

## 2020-12-25 ENCOUNTER — Encounter: Payer: Self-pay | Admitting: Occupational Therapy

## 2020-12-25 ENCOUNTER — Ambulatory Visit: Payer: Federal, State, Local not specified - PPO | Admitting: Occupational Therapy

## 2020-12-25 VITALS — BP 141/74

## 2020-12-25 DIAGNOSIS — M25612 Stiffness of left shoulder, not elsewhere classified: Secondary | ICD-10-CM

## 2020-12-25 DIAGNOSIS — I69354 Hemiplegia and hemiparesis following cerebral infarction affecting left non-dominant side: Secondary | ICD-10-CM

## 2020-12-25 DIAGNOSIS — I69351 Hemiplegia and hemiparesis following cerebral infarction affecting right dominant side: Secondary | ICD-10-CM | POA: Diagnosis not present

## 2020-12-25 DIAGNOSIS — R2681 Unsteadiness on feet: Secondary | ICD-10-CM

## 2020-12-25 DIAGNOSIS — M25512 Pain in left shoulder: Secondary | ICD-10-CM

## 2020-12-25 DIAGNOSIS — M25642 Stiffness of left hand, not elsewhere classified: Secondary | ICD-10-CM

## 2020-12-25 DIAGNOSIS — G8929 Other chronic pain: Secondary | ICD-10-CM

## 2020-12-25 DIAGNOSIS — M6281 Muscle weakness (generalized): Secondary | ICD-10-CM

## 2020-12-25 NOTE — Therapy (Signed)
[facility] [address] [place], [place], [address] Phone: [phone]   Fax:  [phone]  Occupational Therapy Treatment  Patient Details  Name: Bobby Branch MRN: [id number] Date of Birth: 11/08/1960 Referring Provider (OT): Charlott Rakes, MD   Encounter Date: 12/25/2020   OT End of Session - 12/25/20 1431     Visit Number 5    Number of Visits 13    Date for OT Re-Evaluation 01/30/21    Authorization Type [employer] (used 7 visits prior POC)    Progress Note Due on Visit 10    OT Start Time 1315    OT Stop Time 1400    OT Time Calculation (min) 45 min    Activity Tolerance Patient tolerated treatment well    Behavior During Therapy WFL for tasks assessed/performed             Past Medical History:  Diagnosis Date   Anxiety    Depression    Diabetes mellitus without complication ([facility])    type 2   Dyspnea    inhaler   GERD (gastroesophageal reflux disease)    History of kidney stones    HLD (hyperlipidemia)    Hypertension    Stroke ([facility]) 09/2019   left arm and leg weak    Past Surgical History:  Procedure Laterality Date   EYE SURGERY Left    IR ANGIO EXTRACRAN SEL COM CAROTID INNOMINATE UNI L MOD SED  04/20/2020   IR ANGIO INTRA EXTRACRAN SEL COM CAROTID INNOMINATE UNI L MOD SED  06/21/2020   IR ANGIO VERTEBRAL SEL SUBCLAVIAN INNOMINATE UNI R MOD SED  06/21/2020   IR CT HEAD LTD  04/20/2020   IR INTRAVSC STENT CERV CAROTID W/EMB-PROT MOD SED INCL ANGIO  06/21/2020   IR PERCUTANEOUS ART THROMBECTOMY/INFUSION INTRACRANIAL INC DIAG ANGIO  04/20/2020   IR RADIOLOGIST EVAL & MGMT  06/05/2020   IR RADIOLOGIST EVAL & MGMT  07/18/2020   IR RADIOLOGIST EVAL & MGMT  10/23/2020   IR US GUIDE VASC ACCESS RIGHT  04/20/2020   IR US GUIDE VASC ACCESS RIGHT  06/21/2020   RADIOLOGY WITH ANESTHESIA N/A 04/20/2020   Procedure: IR WITH ANESTHESIA;  Surgeon: Luanne Bras, MD;  Location: [facility];  Service:  Radiology;  Laterality: N/A;   RADIOLOGY WITH ANESTHESIA N/A 06/21/2020   Procedure: RADIOLOGY WITH ANESTHESIA  STENTING;  Surgeon: Corrie Mckusick, DO;  Location: [facility];  Service: Anesthesiology;  Laterality: N/A;    Vitals:   12/25/20 1324  BP: (!) 141/74     Subjective Assessment - 12/25/20 1324     Subjective  It fools me - I can go for a day and a half without it bothering me.  I think the cold makes it work.    Pertinent History R MCA stroke.  PMH:  CKD, DM, malnutrition, HTN, polysubstance abuse, prior stroke 08/21 who presents with spastic left sided hemiplegia    Limitations Fall Risk, LUE Hemi    Patient Stated Goals "get rid of shoulder pain, get this arm right, moving again"    Currently in Pain? Yes    Pain Score 3                           OT Treatments/Exercises (OP) - 12/25/20 0001       ADLs   Medication Management Patient reports being out of blood pressure medication.  Encouraged patient to call today to ensure  there is not a lapse in BP medication      Neurological Re-education Exercises   Other Exercises 1 Neuromuscular reeducation to address postural control.  Patient continues to use right arm, and lean strongly to the right which then puts a strain on left arm.  Continue to encourage patient to sit upright and stay midline with stance - to help reduce arm pain.    Other Exercises 2 Sidelying to sidesitting to address alignment and body on arm motion.  Patient is still experiencing arm pain, but has more options to manage pain, and in general reports pain of less intensity.  Contining to work on isolated movement in LUE - elbow flex/ext, forearm pro/supination.  Patient with delayed motor response, but shows improvement in reducing compensatory strategies.                      OT Short Term Goals - 12/25/20 1432       OT SHORT TERM GOAL #1   Title Pt will be independent with new HEP for improving range of motion and decreasing pain  in LUE    Baseline needs review from previous time    Time 4    Period Weeks    Status On-going    Target Date 01/02/21      OT SHORT TERM GOAL #2   Title Patient will demonstrate sufficient shoulder range of motion and/or ability to lift arm sufficiently to apply deodorant and wash under left arm with pain no greater than 1-2/10    Baseline unable to lift arm sufficiently    Time 4    Period Weeks    Status On-going    Target Date 01/02/21      OT SHORT TERM GOAL #3   Title Pt will demonstrate 10% of active flexion and extension in LUE hand for increasing and progressing towards grasp/release    Baseline no volitional movement    Time 4    Period Weeks    Status On-going      OT SHORT TERM GOAL #4   Title Pt will verbalize understanding of splint wear and care instructions for increasing prolonged stretch in LUE hand.    Baseline has custom resting hand splint from previous sessions    Time 4    Period Weeks    Status On-going      OT SHORT TERM GOAL #5   Title Patient will demonstrate 90 degrees of passive shoulder flexion in LUE in preparation for forward reaching    Baseline ~70*    Time 4    Period Weeks    Status On-going               OT Long Term Goals - 12/25/20 1433       OT LONG TERM GOAL #1   Title Patient will complete updated HEP designed to improve functional use of LUE    Baseline No HEP    Time 8    Period Weeks    Status On-going      OT LONG TERM GOAL #2   Title Pt will verbalize understanding of adapted strategies and equipment PRN for increasing safety and independence with ADLs and IADLs (cutting food, vegetables, washing dishes, etc)    Baseline No current splints or slings    Time 8    Period Weeks    Status On-going      OT LONG TERM GOAL #3   Title Patient will demonstrate ability  to cut food on plate    Baseline Unable to cut up food    Time 8    Period Weeks    Status On-going      OT LONG TERM GOAL #4   Title Pt will  demonstrate ability to stabilize a dish in LUE and wash with RUE    Baseline Unable to cut veggies    Time 8    Period Weeks    Status On-going      OT LONG TERM GOAL #5   Title Patient will demonstrate ability to button/zipper clothing as needed    Baseline Opts to wear clothing without fasteners    Time 8    Period Weeks    Status On-going                   Plan - 12/25/20 1432     Clinical Impression Statement Pt is slowly showing some decrease in shoulder pain, and passive range of motion    OT Occupational Profile and History Detailed Assessment- Review of Records and additional review of physical, cognitive, psychosocial history related to current functional performance    Occupational performance deficits (Please refer to evaluation for details): ADL's;IADL's;Rest and Sleep    Body Structure / Function / Physical Skills ADL;Dexterity;Flexibility;Muscle spasms;ROM;Strength;Vision;Tone;IADL;FMC;Edema;Coordination;Balance;Body mechanics;Decreased knowledge of precautions;Endurance;Improper spinal/pelvic alignment;Pain;Sensation;Decreased knowledge of use of DME;GMC;Mobility;Proprioception    Cognitive Skills Attention;Memory;Perception;Energy/Drive;Problem Solve;Safety Awareness    Rehab Potential Good    Clinical Decision Making Several treatment options, min-mod task modification necessary    Comorbidities Affecting Occupational Performance: May have comorbidities impacting occupational performance    Modification or Assistance to Complete Evaluation  Min-Moderate modification of tasks or assist with assess necessary to complete eval    OT Frequency 1x / week    OT Duration 8 weeks   12 visits over 8 weeks d/t any scheduling conflicts   OT Treatment/Interventions Self-care/ADL training;Therapeutic exercise;DME and/or AE instruction;Functional Mobility Training;Cognitive remediation/compensation;Balance training;Visual/perceptual remediation/compensation;Splinting;Manual  Therapy;Neuromuscular education;Fluidtherapy;Electrical Stimulation;Aquatic Barista;Iontophoresis;Passive range of motion;Therapeutic activities;Patient/family education    Plan supine manual therapy, self PROM, bed positioning LUE.    Consulted and Agree with Plan of Care Patient             Patient will benefit from skilled therapeutic intervention in order to improve the following deficits and impairments:   Body Structure / Function / Physical Skills: ADL, Dexterity, Flexibility, Muscle spasms, ROM, Strength, Vision, Tone, IADL, FMC, Edema, Coordination, Balance, Body mechanics, Decreased knowledge of precautions, Endurance, Improper spinal/pelvic alignment, Pain, Sensation, Decreased knowledge of use of DME, GMC, Mobility, Proprioception Cognitive Skills: Attention, Memory, Perception, Energy/Drive, Problem Solve, Safety Awareness     Visit Diagnosis: Spastic 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 (HCC)  Unsteadiness on feet  Muscle weakness (generalized)  Chronic left shoulder pain  Stiffness of left hand, not elsewhere classified  Stiffness of left shoulder, not elsewhere classified    Problem List Patient Active Problem List   Diagnosis Date Noted   Internal carotid artery stenosis, bilateral 06/21/2020   Slow transit constipation    Spastic hemiplegia affecting nondominant side (HCC)    Stage 3b chronic kidney disease ([facility])    Essential hypertension    Controlled type 2 diabetes mellitus with hyperglycemia, without long-term current use of insulin ([facility])    Protein-calorie malnutrition, severe 04/25/2020   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 [place], [place], [address] Phone: [phone]   Fax:   [phone]  Name: Bobby Branch MRN: [id number] Date of Birth: [birth year]

## 2020-12-26 ENCOUNTER — Other Ambulatory Visit: Payer: Self-pay | Admitting: Internal Medicine

## 2020-12-26 DIAGNOSIS — N1832 Chronic kidney disease, stage 3b: Secondary | ICD-10-CM

## 2021-01-01 ENCOUNTER — Other Ambulatory Visit: Payer: Self-pay

## 2021-01-01 ENCOUNTER — Encounter: Payer: Self-pay | Admitting: Occupational Therapy

## 2021-01-01 ENCOUNTER — Ambulatory Visit: Payer: Federal, State, Local not specified - PPO

## 2021-01-01 ENCOUNTER — Ambulatory Visit: Payer: Federal, State, Local not specified - PPO | Admitting: Occupational Therapy

## 2021-01-01 DIAGNOSIS — I69354 Hemiplegia and hemiparesis following cerebral infarction affecting left non-dominant side: Secondary | ICD-10-CM

## 2021-01-01 DIAGNOSIS — R2681 Unsteadiness on feet: Secondary | ICD-10-CM

## 2021-01-01 DIAGNOSIS — M25612 Stiffness of left shoulder, not elsewhere classified: Secondary | ICD-10-CM

## 2021-01-01 DIAGNOSIS — G8929 Other chronic pain: Secondary | ICD-10-CM

## 2021-01-01 DIAGNOSIS — I69351 Hemiplegia and hemiparesis following cerebral infarction affecting right dominant side: Secondary | ICD-10-CM | POA: Diagnosis not present

## 2021-01-01 DIAGNOSIS — M6281 Muscle weakness (generalized): Secondary | ICD-10-CM

## 2021-01-01 DIAGNOSIS — M25642 Stiffness of left hand, not elsewhere classified: Secondary | ICD-10-CM

## 2021-01-01 NOTE — Therapy (Signed)
[facility] [address] [place], [place], [address] Phone: [phone]   Fax:  [phone]  Occupational Therapy Treatment  Patient Details  Name: Bobby Branch MRN: [id number] Date of Birth: 1960-10-05 Referring Provider (OT): Charlott Rakes, MD   Encounter Date: 01/01/2021   OT End of Session - 01/01/21 1433     Visit Number 6    Number of Visits 13    Date for OT Re-Evaluation 01/30/21    Authorization Type [employer] Medicaid (used 7 visits prior POC)    Progress Note Due on Visit 10    OT Start Time 1145    OT Stop Time 1230    OT Time Calculation (min) 45 min    Activity Tolerance Patient tolerated treatment well    Behavior During Therapy WFL for tasks assessed/performed             Past Medical History:  Diagnosis Date   Anxiety    Depression    Diabetes mellitus without complication ([facility])    type 2   Dyspnea    inhaler   GERD (gastroesophageal reflux disease)    History of kidney stones    HLD (hyperlipidemia)    Hypertension    Stroke ([facility]) 09/2019   left arm and leg weak    Past Surgical History:  Procedure Laterality Date   EYE SURGERY Left    IR ANGIO EXTRACRAN SEL COM CAROTID INNOMINATE UNI L MOD SED  04/20/2020   IR ANGIO INTRA EXTRACRAN SEL COM CAROTID INNOMINATE UNI L MOD SED  06/21/2020   IR ANGIO VERTEBRAL SEL SUBCLAVIAN INNOMINATE UNI R MOD SED  06/21/2020   IR CT HEAD LTD  04/20/2020   IR INTRAVSC STENT CERV CAROTID W/EMB-PROT MOD SED INCL ANGIO  06/21/2020   IR PERCUTANEOUS ART THROMBECTOMY/INFUSION INTRACRANIAL INC DIAG ANGIO  04/20/2020   IR RADIOLOGIST EVAL & MGMT  06/05/2020   IR RADIOLOGIST EVAL & MGMT  07/18/2020   IR RADIOLOGIST EVAL & MGMT  10/23/2020   IR US GUIDE VASC ACCESS RIGHT  04/20/2020   IR US GUIDE VASC ACCESS RIGHT  06/21/2020   RADIOLOGY WITH ANESTHESIA N/A 04/20/2020   Procedure: IR WITH ANESTHESIA;  Surgeon: Luanne Bras, MD;  Location: [facility];  Service:  Radiology;  Laterality: N/A;   RADIOLOGY WITH ANESTHESIA N/A 06/21/2020   Procedure: RADIOLOGY WITH ANESTHESIA  STENTING;  Surgeon: Corrie Mckusick, DO;  Location: [facility];  Service: Anesthesiology;  Laterality: N/A;    There were no vitals filed for this visit.   Subjective Assessment - 01/01/21 1154     Subjective  I am not supposed to take Advil    Pertinent History R MCA stroke.  PMH:  CKD, DM, malnutrition, HTN, polysubstance abuse, prior stroke 08/21 who presents with spastic left sided hemiplegia    Limitations Fall Risk, LUE Hemi    Patient Stated Goals "get rid of shoulder pain, get this arm right, moving again"    Currently in Pain? Yes    Pain Score 1     Pain Location Shoulder    Pain Orientation Left    Pain Descriptors / Indicators Tightness    Pain Type Chronic pain    Pain Onset More than a month ago    Pain Frequency Intermittent    Aggravating Factors  undetermined    Pain Relieving Factors position changes  OT Treatments/Exercises (OP) - 01/01/21 1431       Neurological Re-education Exercises   Other Exercises 1 Continued work to align left shoulder girdle, and alter motor recrutment of reach/pre-reach patterns.  Patient showing improved awareness of role of head in incorrect activation, and improved weight shift toward left side - which leads to improved stretch/range of motion, and decreased pain.      Ultrasound   Ultrasound Location lateral / posterior proximal humerus/ GH joint    Ultrasound Parameters 22mhz, continuous, 0.8w/cm2, x 10 min    Ultrasound Goals Pain                      OT Short Term Goals - 01/01/21 1435       OT SHORT TERM GOAL #1   Title Pt will be independent with new HEP for improving range of motion and decreasing pain in LUE    Baseline needs review from previous time    Time 4    Period Weeks    Status On-going    Target Date 01/02/21      OT SHORT TERM GOAL #2   Title  Patient will demonstrate sufficient shoulder range of motion and/or ability to lift arm sufficiently to apply deodorant and wash under left arm with pain no greater than 1-2/10    Baseline unable to lift arm sufficiently    Time 4    Period Weeks    Status On-going    Target Date 01/02/21      OT SHORT TERM GOAL #3   Title Pt will demonstrate 10% of active flexion and extension in LUE hand for increasing and progressing towards grasp/release    Baseline no volitional movement    Time 4    Period Weeks    Status On-going      OT SHORT TERM GOAL #4   Title Pt will verbalize understanding of splint wear and care instructions for increasing prolonged stretch in LUE hand.    Baseline has custom resting hand splint from previous sessions    Time 4    Period Weeks    Status On-going      OT SHORT TERM GOAL #5   Title Patient will demonstrate 90 degrees of passive shoulder flexion in LUE in preparation for forward reaching    Baseline ~70*    Time 4    Period Weeks    Status On-going               OT Long Term Goals - 01/01/21 1435       OT LONG TERM GOAL #1   Title Patient will complete updated HEP designed to improve functional use of LUE    Baseline No HEP    Time 8    Period Weeks    Status On-going      OT LONG TERM GOAL #2   Title Pt will verbalize understanding of adapted strategies and equipment PRN for increasing safety and independence with ADLs and IADLs (cutting food, vegetables, washing dishes, etc)    Baseline No current splints or slings    Time 8    Period Weeks    Status On-going      OT LONG TERM GOAL #3   Title Patient will demonstrate ability to cut food on plate    Baseline Unable to cut up food    Time 8    Period Weeks    Status On-going      OT  LONG TERM GOAL #4   Title Pt will demonstrate ability to stabilize a dish in LUE and wash with RUE    Baseline Unable to cut veggies    Time 8    Period Weeks    Status On-going      OT LONG  TERM GOAL #5   Title Patient will demonstrate ability to button/zipper clothing as needed    Baseline Opts to wear clothing without fasteners    Time 8    Period Weeks    Status On-going                   Plan - 01/01/21 1434     Clinical Impression Statement Pt continues to report shoulder tightness and pain, but overall reports of pain continue to decrease    OT Occupational Profile and History Detailed Assessment- Review of Records and additional review of physical, cognitive, psychosocial history related to current functional performance    Occupational performance deficits (Please refer to evaluation for details): ADL's;IADL's;Rest and Sleep    Body Structure / Function / Physical Skills ADL;Dexterity;Flexibility;Muscle spasms;ROM;Strength;Vision;Tone;IADL;FMC;Edema;Coordination;Balance;Body mechanics;Decreased knowledge of precautions;Endurance;Improper spinal/pelvic alignment;Pain;Sensation;Decreased knowledge of use of DME;GMC;Mobility;Proprioception    Cognitive Skills Attention;Memory;Perception;Energy/Drive;Problem Solve;Safety Awareness    Rehab Potential Good    Clinical Decision Making Several treatment options, min-mod task modification necessary    Comorbidities Affecting Occupational Performance: May have comorbidities impacting occupational performance    Modification or Assistance to Complete Evaluation  Min-Moderate modification of tasks or assist with assess necessary to complete eval    OT Frequency 1x / week    OT Duration 8 weeks   12 visits over 8 weeks d/t any scheduling conflicts   OT Treatment/Interventions Self-care/ADL training;Therapeutic exercise;DME and/or AE instruction;Functional Mobility Training;Cognitive remediation/compensation;Balance training;Visual/perceptual remediation/compensation;Splinting;Manual Therapy;Neuromuscular education;Fluidtherapy;Electrical Stimulation;Aquatic Barista;Iontophoresis;Passive range of  motion;Therapeutic activities;Patient/family education    Plan check all STG'S, supine manual therapy, self PROM, bed positioning LUE.    Consulted and Agree with Plan of Care Patient             Patient will benefit from skilled therapeutic intervention in order to improve the following deficits and impairments:   Body Structure / Function / Physical Skills: ADL, Dexterity, Flexibility, Muscle spasms, ROM, Strength, Vision, Tone, IADL, FMC, Edema, Coordination, Balance, Body mechanics, Decreased knowledge of precautions, Endurance, Improper spinal/pelvic alignment, Pain, Sensation, Decreased knowledge of use of DME, GMC, Mobility, Proprioception Cognitive Skills: Attention, Memory, Perception, Energy/Drive, Problem Solve, Safety Awareness     Visit Diagnosis: Spastic 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 (HCC)  Unsteadiness on feet  Muscle weakness (generalized)  Chronic left shoulder pain  Stiffness of left hand, not elsewhere classified  Stiffness of left shoulder, not elsewhere classified    Problem List Patient Active Problem List   Diagnosis Date Noted   Internal carotid artery stenosis, bilateral 06/21/2020   Slow transit constipation    Spastic hemiplegia affecting nondominant side (HCC)    Stage 3b chronic kidney disease ([facility])    Essential hypertension    Controlled type 2 diabetes mellitus with hyperglycemia, without long-term current use of insulin ([facility])    Protein-calorie malnutrition, severe 04/25/2020   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

## 2021-01-01 NOTE — Therapy (Signed)
[facility] [address] [place], [place], [address] Phone: [phone]   Fax:  [phone]  Physical Therapy Treatment  Patient Details  Name: Bobby Branch MRN: [id number] Date of Birth: 1960-06-08 Referring Provider (PT): Dr. Charlott Rakes   Encounter Date: 01/01/2021   PT End of Session - 01/01/21 1114     Visit Number 5    Number of Visits 13    Date for PT Re-Evaluation 01/16/21    Authorization Type [employer] Josem Kaufmann sent for 12 visits)    Authorization Time Period [employer] approved 12 visits total combined PT/OT/ST. 12th visit reached on 08/30/20. request for more visits/auth has been submitted on this date.    Progress Note Due on Visit 13    PT Start Time 1100    PT Stop Time 1145    PT Time Calculation (min) 45 min    Equipment Utilized During Treatment Gait belt    Activity Tolerance Patient tolerated treatment well    Behavior During Therapy WFL for tasks assessed/performed             Past Medical History:  Diagnosis Date   Anxiety    Depression    Diabetes mellitus without complication (HCC)    type 2   Dyspnea    inhaler   GERD (gastroesophageal reflux disease)    History of kidney stones    HLD (hyperlipidemia)    Hypertension    Stroke ([place]) 09/2019   left arm and leg weak    Past Surgical History:  Procedure Laterality Date   EYE SURGERY Left    IR ANGIO EXTRACRAN SEL COM CAROTID INNOMINATE UNI L MOD SED  04/20/2020   IR ANGIO INTRA EXTRACRAN SEL COM CAROTID INNOMINATE UNI L MOD SED  06/21/2020   IR ANGIO VERTEBRAL SEL SUBCLAVIAN INNOMINATE UNI R MOD SED  06/21/2020   IR CT HEAD LTD  04/20/2020   IR INTRAVSC STENT CERV CAROTID W/EMB-PROT MOD SED INCL ANGIO  06/21/2020   IR PERCUTANEOUS ART THROMBECTOMY/INFUSION INTRACRANIAL INC DIAG ANGIO  04/20/2020   IR RADIOLOGIST EVAL & MGMT  06/05/2020   IR RADIOLOGIST EVAL & MGMT  07/18/2020   IR RADIOLOGIST EVAL & MGMT  10/23/2020   IR US  GUIDE VASC ACCESS RIGHT  04/20/2020   IR US GUIDE VASC ACCESS RIGHT  06/21/2020   RADIOLOGY WITH ANESTHESIA N/A 04/20/2020   Procedure: IR WITH ANESTHESIA;  Surgeon: Luanne Bras, MD;  Location: [facility];  Service: Radiology;  Laterality: N/A;   RADIOLOGY WITH ANESTHESIA N/A 06/21/2020   Procedure: RADIOLOGY WITH ANESTHESIA  STENTING;  Surgeon: Corrie Mckusick, DO;  Location: [facility];  Service: Anesthesiology;  Laterality: N/A;    There were no vitals filed for this visit.   Subjective Assessment - 01/01/21 1102     Subjective No changes, trying to quit smoking.  Has been using less Advil due to kidney damage.    Pertinent History 60 y.o. 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 s/p emergent cerebral angiogram with  successful thrombectomy with small volume SAH sylvian sulci; cocaine +    Limitations Standing    How long can you sit comfortably? unlimited    How long can you stand comfortably? <5 min    How long can you walk comfortably? <10 min    Patient Stated Goals  To be able to walk again by myself    Pain Onset More than a month ago                               [facility] Adult PT Treatment/Exercise - 01/01/21 0001       Transfers   Transfers Sit to Stand;Stand to Sit    Sit to Stand 6: Modified independent (Device/Increase time)    Stand to Sit 6: Modified independent (Device/Increase time)    Number of Reps Other reps (comment)   15 reps   Comments from airex      Ambulation/Gait   Ambulation/Gait Yes    Ambulation/Gait Assistance 5: Supervision    Ambulation Distance (Feet) 230 Feet    Gait Pattern Decreased step length - left;Step-through pattern;Decreased stance time - left;Decreased stride length;Decreased hip/knee flexion - left;Decreased dorsiflexion - left;Decreased weight shift to  left;Left foot flat;Left flexed knee in stance;Poor foot clearance - left    Ambulation Surface Level;Indoor      Knee/Hip Exercises: Stretches   Hip Flexor Stretch Left;Limitations    Hip Flexor Stretch Limitations 2 min hold      Knee/Hip Exercises: Standing   Terminal Knee Extension Strengthening;Left;3 sets;15 reps;Theraband;Limitations    Theraband Level (Terminal Knee Extension) Level 3 (Green)    Terminal Knee Extension Limitations 3 angles of pull      Knee/Hip Exercises: Supine   Other Supine Knee/Hip Exercises LLE hip fallouts 30x with manual facilitation    Other Supine Knee/Hip Exercises marching LLE 30x with PT facilitation      Ankle Exercises: Stretches   Gastroc Stretch 1 rep    Gastroc Stretch Limitations 2 min hold on wedge                 Balance Exercises - 01/01/21 0001       Balance Exercises: Standing   Stepping Strategy Anterior;Lateral;Limitations    Stepping Strategy Limitations 4" block, 10x ant/lat with CGA and L knee blocked for 20x each position    Other Standing Exercises deadlift with 12# KB to promote fwd weight shift, 15x                  PT Short Term Goals - 12/06/20 1520       PT SHORT TERM GOAL #1   Title Patient to demo I in initial HEP    Time 4    Period Weeks    Status New    Target Date 01/02/21      PT SHORT TERM GOAL #2   Title Pt will be able to stand up without UE support to improve functional strength    Baseline Requires 1 UE support    Time 4    Period Weeks    Status New    Target Date 01/02/21      PT SHORT TERM GOAL #3   Title --    Baseline --    Time --    Period --    Status --    Target Date 08/16/20      PT SHORT TERM GOAL #4   Title --    Baseline --    Time --    Period --    Status --    Target Date 08/16/20      PT SHORT TERM GOAL #5   Title --    Baseline --    Time --  Period --    Status --    Target Date --               PT Long Term Goals - 12/05/20 2355        PT LONG TERM GOAL #1   Title Patient will demo 5x sit to stand with one UE support <18 seconds to improve overall functional strength    Baseline 29 sec with one UE support (12/05/20)    Time 6    Period Weeks    Status New    Target Date 01/16/21      PT LONG TERM GOAL #2   Title Pt will demo gait speed of at least 0.35m/s to improve community ambulation    Baseline 0.48 m/s (12/05/20)    Time 6    Period Weeks    Status New    Target Date 01/16/21      PT LONG TERM GOAL #3   Title Pt will dem FGA score of 20/30 to improve fucntional gait and balance    Baseline 13/30 (12/05/20)    Time 6    Period Weeks    Status New    Target Date 01/16/21                   Plan - 01/01/21 1141     Clinical Impression Statement Focus of today was stretching prior to functional strengthening with emphasis on TKE, steppin and WS through hips, discouraging abnormal patterns.  Difficulty noted in WS through hips due to learned paterns since onset.    Personal Factors and Comorbidities Comorbidity 2;Past/Current Experience;Time since onset of injury/illness/exacerbation    Comorbidities CVA, DM    Examination-Activity Limitations Locomotion Level;Transfers;Squat;Stairs;Stand;Lift;Dressing    Examination-Participation Restrictions Cleaning;Community Activity;Laundry;Meal Prep;Shop;Yard Work    Stability/Clinical Decision Making Stable/Uncomplicated    Rehab Potential Good    PT Frequency 2x / week    PT Duration 6 weeks    PT Treatment/Interventions ADLs/Self Care Home Management;DME Instruction;Gait training;Stair training;Functional mobility training;Therapeutic activities;Therapeutic exercise;Balance training;Neuromuscular re-education;Patient/family education;Cryotherapy;Moist Heat;Electrical Stimulation;Manual techniques;Orthotic Fit/Training;Passive range of motion;Energy conservation;Joint Manipulations    PT Next Visit Plan Work on [place], work on hip  flexion/knee flexion synnergy pattern with rapid movement for fast twitch mm fibers, work on sit to stand without UE support to improve use of L LE, fwd WS to increase confidence when transferring, focu son LLE TKE and heel strike    PT Home Exercise Plan Supine heel slides    Consulted and Agree with Plan of Care Patient             Patient will benefit from skilled therapeutic intervention in order to improve the following deficits and impairments:  Abnormal gait, Decreased coordination, Difficulty walking, Decreased endurance, Decreased activity tolerance, Decreased balance, Decreased mobility, Decreased strength, Decreased range of motion, Impaired flexibility, Increased fascial restricitons, Hypomobility, Impaired sensation, Impaired tone, Impaired UE functional use, Improper body mechanics, Postural dysfunction, Pain  Visit Diagnosis: Spastic 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 (HCC)  Unsteadiness on feet  Muscle weakness (generalized)     Problem List Patient Active Problem List   Diagnosis Date Noted   Internal carotid artery stenosis, bilateral 06/21/2020   Slow transit constipation    Spastic hemiplegia affecting nondominant side (HCC)    Stage 3b chronic kidney disease ([place])    Essential hypertension    Controlled type 2 diabetes mellitus with hyperglycemia, without long-term current use of insulin (  [place])    Protein-calorie malnutrition, severe 04/25/2020   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

## 2021-01-02 ENCOUNTER — Other Ambulatory Visit: Payer: Self-pay

## 2021-01-02 ENCOUNTER — Telehealth (HOSPITAL_COMMUNITY): Payer: Self-pay

## 2021-01-02 ENCOUNTER — Ambulatory Visit
Admission: RE | Admit: 2021-01-02 | Discharge: 2021-01-02 | Disposition: A | Discharge status: Home / Self Care | Payer: Federal, State, Local not specified - PPO | Source: Ambulatory Visit | Attending: Internal Medicine | Admitting: Internal Medicine

## 2021-01-02 DIAGNOSIS — N1832 Chronic kidney disease, stage 3b: Secondary | ICD-10-CM

## 2021-01-02 MED ORDER — LOSARTAN POTASSIUM 25 MG PO TABS
25.0000 mg | ORAL_TABLET | Freq: Every day | ORAL | 2 refills | Status: DC
  Filled 2021-01-02 – 2021-01-10 (×2): qty 90, 90d supply, fill #0

## 2021-01-02 NOTE — Telephone Encounter (Signed)
Called pt about rescheduling case with Dr. Earleen Newport. Pt would like to schedule on a Wed. Discussed doing the procedure on 12/21. Will find out from the schedulers if we can get Dr. Earleen Newport to [facility] that day and call pt back. AW

## 2021-01-03 ENCOUNTER — Telehealth: Payer: Self-pay | Admitting: Family Medicine

## 2021-01-03 ENCOUNTER — Other Ambulatory Visit: Payer: Self-pay

## 2021-01-03 ENCOUNTER — Ambulatory Visit: Payer: Federal, State, Local not specified - PPO | Attending: Family Medicine

## 2021-01-03 ENCOUNTER — Encounter: Payer: Self-pay | Admitting: Occupational Therapy

## 2021-01-03 ENCOUNTER — Ambulatory Visit: Payer: Federal, State, Local not specified - PPO | Admitting: Occupational Therapy

## 2021-01-03 DIAGNOSIS — M25612 Stiffness of left shoulder, not elsewhere classified: Secondary | ICD-10-CM | POA: Insufficient documentation

## 2021-01-03 DIAGNOSIS — I69354 Hemiplegia and hemiparesis following cerebral infarction affecting left non-dominant side: Secondary | ICD-10-CM

## 2021-01-03 DIAGNOSIS — M6281 Muscle weakness (generalized): Secondary | ICD-10-CM | POA: Insufficient documentation

## 2021-01-03 DIAGNOSIS — G8929 Other chronic pain: Secondary | ICD-10-CM

## 2021-01-03 DIAGNOSIS — R2681 Unsteadiness on feet: Secondary | ICD-10-CM

## 2021-01-03 DIAGNOSIS — M25512 Pain in left shoulder: Secondary | ICD-10-CM | POA: Insufficient documentation

## 2021-01-03 DIAGNOSIS — M25642 Stiffness of left hand, not elsewhere classified: Secondary | ICD-10-CM | POA: Insufficient documentation

## 2021-01-03 NOTE — Telephone Encounter (Signed)
Copied from [facility] [id number]. Topic: General - Inquiry >> Jan 01, 2021  1:42 PM Greggory Keen D wrote: Reason for CRM: Pt called asking if he could get a referral for home health aid.  CB# [phone]

## 2021-01-03 NOTE — Therapy (Signed)
[facility] [address] [place], [place], [address] Phone: [phone]   Fax:  [phone]  Physical Therapy Treatment  Patient Details  Name: Bobby Branch MRN: [id number] Date of Birth: 01/28/1961 Referring Provider (PT): Dr. Charlott Rakes   Encounter Date: 01/03/2021   PT End of Session - 01/03/21 1446     Visit Number 6    Number of Visits 13    Date for PT Re-Evaluation 01/16/21    Authorization Type [employer] Josem Kaufmann sent for 12 visits)    Authorization Time Period [employer] approved 12 visits total combined PT/OT/ST.    Authorization - Number of Visits 12    Progress Note Due on Visit 10    PT Start Time 1450    PT Stop Time 1530    PT Time Calculation (min) 40 min    Equipment Utilized During Treatment Gait belt    Activity Tolerance Patient tolerated treatment well    Behavior During Therapy WFL for tasks assessed/performed             Past Medical History:  Diagnosis Date   Anxiety    Depression    Diabetes mellitus without complication ([place])    type 2   Dyspnea    inhaler   GERD (gastroesophageal reflux disease)    History of kidney stones    HLD (hyperlipidemia)    Hypertension    Stroke ([place]) 09/2019   left arm and leg weak    Past Surgical History:  Procedure Laterality Date   EYE SURGERY Left    IR ANGIO EXTRACRAN SEL COM CAROTID INNOMINATE UNI L MOD SED  04/20/2020   IR ANGIO INTRA EXTRACRAN SEL COM CAROTID INNOMINATE UNI L MOD SED  06/21/2020   IR ANGIO VERTEBRAL SEL SUBCLAVIAN INNOMINATE UNI R MOD SED  06/21/2020   IR CT HEAD LTD  04/20/2020   IR INTRAVSC STENT CERV CAROTID W/EMB-PROT MOD SED INCL ANGIO  06/21/2020   IR PERCUTANEOUS ART THROMBECTOMY/INFUSION INTRACRANIAL INC DIAG ANGIO  04/20/2020   IR RADIOLOGIST EVAL & MGMT  06/05/2020   IR RADIOLOGIST EVAL & MGMT  07/18/2020   IR RADIOLOGIST EVAL & MGMT  10/23/2020   IR US GUIDE VASC ACCESS RIGHT  04/20/2020   IR US GUIDE VASC  ACCESS RIGHT  06/21/2020   RADIOLOGY WITH ANESTHESIA N/A 04/20/2020   Procedure: IR WITH ANESTHESIA;  Surgeon: Luanne Bras, MD;  Location: [facility];  Service: Radiology;  Laterality: N/A;   RADIOLOGY WITH ANESTHESIA N/A 06/21/2020   Procedure: RADIOLOGY WITH ANESTHESIA  STENTING;  Surgeon: Corrie Mckusick, DO;  Location: [facility];  Service: Anesthesiology;  Laterality: N/A;    There were no vitals filed for this visit.   Subjective Assessment - 01/03/21 1454     Subjective Feels walking is good, but becomes"wobbly" when tired.    Pertinent History 60 y.o. 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 s/p emergent cerebral angiogram with  successful thrombectomy with small volume SAH sylvian sulci; cocaine +    Limitations Standing    How long can you sit comfortably? unlimited    How long can you stand comfortably? <5 min    How long can you walk comfortably? <10 min    Patient Stated Goals To be able to walk again by myself    Pain Onset More  than a month ago                               [facility] Adult PT Treatment/Exercise - 01/03/21 0001       Transfers   Transfers Sit to Stand;Stand to Sit    Sit to Stand 6: Modified independent (Device/Increase time)    Stand to Sit 6: Modified independent (Device/Increase time)    Comments from airex      Ambulation/Gait   Ambulation/Gait Yes    Ambulation/Gait Assistance 5: Supervision    Ambulation Distance (Feet) 230 Feet    Gait Pattern Decreased step length - left;Step-through pattern;Decreased stance time - left;Decreased stride length;Decreased hip/knee flexion - left;Decreased dorsiflexion - left;Decreased weight shift to left;Left foot flat;Left flexed knee in stance;Poor foot clearance - left    Ambulation Surface Level;Indoor      Self-Care   Self-Care  Other Self-Care Comments    Other Self-Care Comments  discussed AFO use and applied L AFO and assisted patient with donning/doffing.  Felt AFO was beneficial and feels would help him with mobility                 Balance Exercises - 01/03/21 0001       Balance Exercises: Standing   Stepping Strategy Anterior;10 reps;Limitations    Stepping Strategy Limitations 6" block, 10x ant with CGA and L knee blocked for 10x each position    Other Standing Exercises deadlift with 12# KB to promote fwd weight shift, 15x from airex pad                  PT Short Term Goals - 01/03/21 1449       PT SHORT TERM GOAL #1   Title Patient to demo I in initial HEP    Baseline 01/03/21 Patient has been walking daily for exercise, including errands and shopping.    Time 4    Period Weeks    Status Achieved    Target Date 01/02/21      PT SHORT TERM GOAL #2   Title Pt will be able to stand up without UE support to improve functional strength    Baseline Requires 1 UE support; 01/03/21 Able to stand 1 time w/o UE assist    Time 4    Period Weeks    Status Achieved    Target Date 01/02/21      PT SHORT TERM GOAL #3   Target Date 08/16/20      PT SHORT TERM GOAL #4   Target Date 08/16/20               PT Long Term Goals - 12/05/20 2355       PT LONG TERM GOAL #1   Title Patient will demo 5x sit to stand with one UE support <18 seconds to improve overall functional strength    Baseline 29 sec with one UE support (12/05/20)    Time 6    Period Weeks    Status New    Target Date 01/16/21      PT LONG TERM GOAL #2   Title Pt will demo gait speed of at least 0.74m/s to improve community ambulation    Baseline 0.48 m/s (12/05/20)    Time 6    Period Weeks    Status New    Target Date 01/16/21      PT LONG TERM GOAL #3  Title Pt will dem FGA score of 20/30 to improve fucntional gait and balance    Baseline 13/30 (12/05/20)    Time 6    Period Weeks    Status New     Target Date 01/16/21                   Plan - 01/03/21 1720     Clinical Impression Statement Treatment performed using [place] AFO with brace providing assisted DF and allowing improved quality of swing through technique.  Continued stepping tasks for WS onto blocks encouraging postural correction and upright trunk.    Personal Factors and Comorbidities Comorbidity 2;Past/Current Experience;Time since onset of injury/illness/exacerbation    Comorbidities CVA, DM    Examination-Activity Limitations Locomotion Level;Transfers;Squat;Stairs;Stand;Lift;Dressing    Examination-Participation Restrictions Cleaning;Community Activity;Laundry;Meal Prep;Shop;Yard Work    Stability/Clinical Decision Making Stable/Uncomplicated    Rehab Potential Good    PT Frequency 2x / week    PT Duration 6 weeks    PT Treatment/Interventions ADLs/Self Care Home Management;DME Instruction;Gait training;Stair training;Functional mobility training;Therapeutic activities;Therapeutic exercise;Balance training;Neuromuscular re-education;Patient/family education;Cryotherapy;Moist Heat;Electrical Stimulation;Manual techniques;Orthotic Fit/Training;Passive range of motion;Energy conservation;Joint Manipulations    PT Next Visit Plan Continue functional atepping and gait training with AFO to maximize function andreduce fall risk    PT Home Exercise Plan Supine heel slides    Consulted and Agree with Plan of Care Patient             Patient will benefit from skilled therapeutic intervention in order to improve the following deficits and impairments:  Abnormal gait, Decreased coordination, Difficulty walking, Decreased endurance, Decreased activity tolerance, Decreased balance, Decreased mobility, Decreased strength, Decreased range of motion, Impaired flexibility, Increased fascial restricitons, Hypomobility, Impaired sensation, Impaired tone, Impaired UE functional use, Improper body mechanics, Postural  dysfunction, Pain  Visit Diagnosis: Spastic 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 (HCC)  Unsteadiness on feet  Muscle weakness (generalized)     Problem List Patient Active Problem List   Diagnosis Date Noted   Internal carotid artery stenosis, bilateral 06/21/2020   Slow transit constipation    Spastic hemiplegia affecting nondominant side (HCC)    Stage 3b chronic kidney disease ([place])    Essential hypertension    Controlled type 2 diabetes mellitus with hyperglycemia, without long-term current use of insulin (HCC)    Protein-calorie malnutrition, severe 04/25/2020   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 [place], [place], [address] Phone: [phone]   Fax:  [phone]  Name: Bobby Branch MRN: [id number] Date of Birth: Jun 12, 1960

## 2021-01-03 NOTE — Therapy (Signed)
[facility] [address] [place], [place], [address] Phone: [phone]   Fax:  [phone]  Occupational Therapy Treatment  Patient Details  Name: Bobby Branch MRN: [id number] Date of Birth: 1960-11-29 Referring Provider (OT): Charlott Rakes, MD   Encounter Date: 01/03/2021   OT End of Session - 01/03/21 1524     Visit Number 7    Number of Visits 13    Date for OT Re-Evaluation 01/30/21    Authorization Type [employer] (used 7 visits prior POC)    Progress Note Due on Visit 10    OT Start Time 1405    OT Stop Time 1445    OT Time Calculation (min) 40 min    Activity Tolerance Patient tolerated treatment well    Behavior During Therapy WFL for tasks assessed/performed             Past Medical History:  Diagnosis Date   Anxiety    Depression    Diabetes mellitus without complication ([place])    type 2   Dyspnea    inhaler   GERD (gastroesophageal reflux disease)    History of kidney stones    HLD (hyperlipidemia)    Hypertension    Stroke ([place]) 09/2019   left arm and leg weak    Past Surgical History:  Procedure Laterality Date   EYE SURGERY Left    IR ANGIO EXTRACRAN SEL COM CAROTID INNOMINATE UNI L MOD SED  04/20/2020   IR ANGIO INTRA EXTRACRAN SEL COM CAROTID INNOMINATE UNI L MOD SED  06/21/2020   IR ANGIO VERTEBRAL SEL SUBCLAVIAN INNOMINATE UNI R MOD SED  06/21/2020   IR CT HEAD LTD  04/20/2020   IR INTRAVSC STENT CERV CAROTID W/EMB-PROT MOD SED INCL ANGIO  06/21/2020   IR PERCUTANEOUS ART THROMBECTOMY/INFUSION INTRACRANIAL INC DIAG ANGIO  04/20/2020   IR RADIOLOGIST EVAL & MGMT  06/05/2020   IR RADIOLOGIST EVAL & MGMT  07/18/2020   IR RADIOLOGIST EVAL & MGMT  10/23/2020   IR US GUIDE VASC ACCESS RIGHT  04/20/2020   IR US GUIDE VASC ACCESS RIGHT  06/21/2020   RADIOLOGY WITH ANESTHESIA N/A 04/20/2020   Procedure: IR WITH ANESTHESIA;  Surgeon: Luanne Bras, MD;  Location: [facility];  Service:  Radiology;  Laterality: N/A;   RADIOLOGY WITH ANESTHESIA N/A 06/21/2020   Procedure: RADIOLOGY WITH ANESTHESIA  STENTING;  Surgeon: Corrie Mckusick, DO;  Location: [facility];  Service: Anesthesiology;  Laterality: N/A;    There were no vitals filed for this visit.   Subjective Assessment - 01/03/21 1407     Subjective  [employer] says my new arm ain't in today    Pertinent History R MCA stroke.  PMH:  CKD, DM, malnutrition, HTN, polysubstance abuse, prior stroke 08/21 who presents with spastic left sided hemiplegia    Limitations Fall Risk, LUE Hemi    Patient Stated Goals "get rid of shoulder pain, get this arm right, moving again"    Currently in Pain? Yes    Pain Score 2     Pain Location Shoulder                          OT Treatments/Exercises (OP) - 01/03/21 1517       Neurological Re-education Exercises   Other Exercises 1 Neuromuscular reeducation to address left shoulder girdle alignment and motor recruitment.  Patient reports continued pain especially when lying down at night.  Patient with very forward  position of head of humerus, and scapula elevated .  Patient reports immediate relief of physical symptoms when realigned.  Patient tolerates only minimal weight shifting onto realigned [place] joint.  Reports pain in [facility] joint, or posterior proximal humerus.  Patient attempting to reduce prescribed medication for herbal options.  Have encouraged him to work with his health care team to ensure lab values remain therapeutic.  Patient reports confidence in his use of herbal combinations.    Other Exercises 2 weight bearing through LUE sidelying to side sit.                      OT Short Term Goals - 01/03/21 1525       OT SHORT TERM GOAL #1   Title Pt will be independent with new HEP for improving range of motion and decreasing pain in LUE    Baseline needs review from previous time    Time 4    Period Weeks    Status On-going    Target Date 01/02/21      OT  SHORT TERM GOAL #2   Title Patient will demonstrate sufficient shoulder range of motion and/or ability to lift arm sufficiently to apply deodorant and wash under left arm with pain no greater than 1-2/10    Baseline unable to lift arm sufficiently    Time 4    Period Weeks    Status On-going    Target Date 01/02/21      OT SHORT TERM GOAL #3   Title Pt will demonstrate 10% of active flexion and extension in LUE hand for increasing and progressing towards grasp/release    Baseline no volitional movement    Time 4    Period Weeks    Status On-going      OT SHORT TERM GOAL #4   Title Pt will verbalize understanding of splint wear and care instructions for increasing prolonged stretch in LUE hand.    Baseline has custom resting hand splint from previous sessions    Time 4    Period Weeks    Status On-going      OT SHORT TERM GOAL #5   Title Patient will demonstrate 90 degrees of passive shoulder flexion in LUE in preparation for forward reaching    Baseline ~70*    Time 4    Period Weeks    Status On-going               OT Long Term Goals - 01/03/21 1525       OT LONG TERM GOAL #1   Title Patient will complete updated HEP designed to improve functional use of LUE    Baseline No HEP    Time 8    Period Weeks    Status On-going      OT LONG TERM GOAL #2   Title Pt will verbalize understanding of adapted strategies and equipment PRN for increasing safety and independence with ADLs and IADLs (cutting food, vegetables, washing dishes, etc)    Baseline No current splints or slings    Time 8    Period Weeks    Status On-going      OT LONG TERM GOAL #3   Title Patient will demonstrate ability to cut food on plate    Baseline Unable to cut up food    Time 8    Period Weeks    Status On-going      OT LONG TERM GOAL #4   Title  Pt will demonstrate ability to stabilize a dish in LUE and wash with RUE    Baseline Unable to cut veggies    Time 8    Period Weeks    Status  On-going      OT LONG TERM GOAL #5   Title Patient will demonstrate ability to button/zipper clothing as needed    Baseline Opts to wear clothing without fasteners    Time 8    Period Weeks    Status On-going                   Plan - 01/03/21 1524     Clinical Impression Statement Pt continues to report shoulder tightness and pain, but overall reports of pain continue to decrease    OT Occupational Profile and History Detailed Assessment- Review of Records and additional review of physical, cognitive, psychosocial history related to current functional performance    Occupational performance deficits (Please refer to evaluation for details): ADL's;IADL's;Rest and Sleep    Body Structure / Function / Physical Skills ADL;Dexterity;Flexibility;Muscle spasms;ROM;Strength;Vision;Tone;IADL;FMC;Edema;Coordination;Balance;Body mechanics;Decreased knowledge of precautions;Endurance;Improper spinal/pelvic alignment;Pain;Sensation;Decreased knowledge of use of DME;GMC;Mobility;Proprioception    Cognitive Skills Attention;Memory;Perception;Energy/Drive;Problem Solve;Safety Awareness    Rehab Potential Good    Clinical Decision Making Several treatment options, min-mod task modification necessary    Comorbidities Affecting Occupational Performance: May have comorbidities impacting occupational performance    Modification or Assistance to Complete Evaluation  Min-Moderate modification of tasks or assist with assess necessary to complete eval    OT Frequency 1x / week    OT Duration 8 weeks   12 visits over 8 weeks d/t any scheduling conflicts   OT Treatment/Interventions Self-care/ADL training;Therapeutic exercise;DME and/or AE instruction;Functional Mobility Training;Cognitive remediation/compensation;Balance training;Visual/perceptual remediation/compensation;Splinting;Manual Therapy;Neuromuscular education;Fluidtherapy;Electrical Stimulation;Aquatic IT sales professional;Iontophoresis;Passive range of motion;Therapeutic activities;Patient/family education    Plan check all STG'S, supine manual therapy, self PROM, bed positioning LUE.    Consulted and Agree with Plan of Care Patient             Patient will benefit from skilled therapeutic intervention in order to improve the following deficits and impairments:   Body Structure / Function / Physical Skills: ADL, Dexterity, Flexibility, Muscle spasms, ROM, Strength, Vision, Tone, IADL, FMC, Edema, Coordination, Balance, Body mechanics, Decreased knowledge of precautions, Endurance, Improper spinal/pelvic alignment, Pain, Sensation, Decreased knowledge of use of DME, GMC, Mobility, Proprioception Cognitive Skills: Attention, Memory, Perception, Energy/Drive, Problem Solve, Safety Awareness     Visit Diagnosis: Spastic 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 (HCC)  Unsteadiness on feet  Muscle weakness (generalized)  Chronic left shoulder pain  Stiffness of left hand, not elsewhere classified  Stiffness of left shoulder, not elsewhere classified    Problem List Patient Active Problem List   Diagnosis Date Noted   Internal carotid artery stenosis, bilateral 06/21/2020   Slow transit constipation    Spastic hemiplegia affecting nondominant side (HCC)    Stage 3b chronic kidney disease ([facility])    Essential hypertension    Controlled type 2 diabetes mellitus with hyperglycemia, without long-term current use of insulin ([facility])    Protein-calorie malnutrition, severe 04/25/2020   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 [place], [place], [address] Phone: [phone]   Fax:  [phone]  Name: Bobby Branch MRN: [id number] Date of Birth: 1960-02-07

## 2021-01-04 ENCOUNTER — Other Ambulatory Visit: Payer: Self-pay

## 2021-01-04 ENCOUNTER — Other Ambulatory Visit: Payer: Self-pay | Admitting: Family Medicine

## 2021-01-04 MED ORDER — MISC. DEVICES MISC: 0 refills | Status: DC

## 2021-01-04 NOTE — Progress Notes (Signed)
Please fax L AFO brace to [facility] at [phone]. Thanks

## 2021-01-07 ENCOUNTER — Other Ambulatory Visit: Payer: Self-pay

## 2021-01-07 MED ORDER — MISC. DEVICES MISC: 0 refills | Status: AC

## 2021-01-07 NOTE — Telephone Encounter (Signed)
We can complete a PCS form for him and I will sign.

## 2021-01-07 NOTE — Telephone Encounter (Signed)
Routing to PCP for review.

## 2021-01-07 NOTE — Progress Notes (Signed)
Order will be faxed once PCP sign off.

## 2021-01-08 ENCOUNTER — Other Ambulatory Visit: Payer: Self-pay

## 2021-01-08 ENCOUNTER — Encounter: Payer: Self-pay | Admitting: Occupational Therapy

## 2021-01-08 ENCOUNTER — Ambulatory Visit: Payer: Federal, State, Local not specified - PPO

## 2021-01-08 ENCOUNTER — Ambulatory Visit: Payer: Federal, State, Local not specified - PPO | Admitting: Occupational Therapy

## 2021-01-08 DIAGNOSIS — I69354 Hemiplegia and hemiparesis following cerebral infarction affecting left non-dominant side: Secondary | ICD-10-CM

## 2021-01-08 DIAGNOSIS — M25612 Stiffness of left shoulder, not elsewhere classified: Secondary | ICD-10-CM

## 2021-01-08 DIAGNOSIS — G8929 Other chronic pain: Secondary | ICD-10-CM

## 2021-01-08 DIAGNOSIS — M25512 Pain in left shoulder: Secondary | ICD-10-CM

## 2021-01-08 DIAGNOSIS — M25642 Stiffness of left hand, not elsewhere classified: Secondary | ICD-10-CM

## 2021-01-08 NOTE — Therapy (Signed)
[facility] [address] [place], [place], [address] Phone: [phone]   Fax:  [phone]  Occupational Therapy Treatment  Patient Details  Name: Bobby Branch MRN: [id number] Date of Birth: [birth year] Referring Provider (OT): Charlott Rakes, MD   Encounter Date: 01/08/2021   OT End of Session - 01/08/21 1238     Visit Number 8    Number of Visits 13    Date for OT Re-Evaluation 01/30/21    Authorization Type [employer] Medicaid (used 7 visits prior POC)    OT Start Time 1236    OT Stop Time 1315    OT Time Calculation (min) 39 min    Activity Tolerance Patient tolerated treatment well    Behavior During Therapy WFL for tasks assessed/performed             Past Medical History:  Diagnosis Date   Anxiety    Depression    Diabetes mellitus without complication ([facility])    type 2   Dyspnea    inhaler   GERD (gastroesophageal reflux disease)    History of kidney stones    HLD (hyperlipidemia)    Hypertension    Stroke ([facility]) 09/2019   left arm and leg weak    Past Surgical History:  Procedure Laterality Date   EYE SURGERY Left    IR ANGIO EXTRACRAN SEL COM CAROTID INNOMINATE UNI L MOD SED  04/20/2020   IR ANGIO INTRA EXTRACRAN SEL COM CAROTID INNOMINATE UNI L MOD SED  06/21/2020   IR ANGIO VERTEBRAL SEL SUBCLAVIAN INNOMINATE UNI R MOD SED  06/21/2020   IR CT HEAD LTD  04/20/2020   IR INTRAVSC STENT CERV CAROTID W/EMB-PROT MOD SED INCL ANGIO  06/21/2020   IR PERCUTANEOUS ART THROMBECTOMY/INFUSION INTRACRANIAL INC DIAG ANGIO  04/20/2020   IR RADIOLOGIST EVAL & MGMT  06/05/2020   IR RADIOLOGIST EVAL & MGMT  07/18/2020   IR RADIOLOGIST EVAL & MGMT  10/23/2020   IR US GUIDE VASC ACCESS RIGHT  04/20/2020   IR US GUIDE VASC ACCESS RIGHT  06/21/2020   RADIOLOGY WITH ANESTHESIA N/A 04/20/2020   Procedure: IR WITH ANESTHESIA;  Surgeon: Luanne Bras, MD;  Location: [facility];  Service: Radiology;  Laterality: N/A;    RADIOLOGY WITH ANESTHESIA N/A 06/21/2020   Procedure: RADIOLOGY WITH ANESTHESIA  STENTING;  Surgeon: Corrie Mckusick, DO;  Location: [facility];  Service: Anesthesiology;  Laterality: N/A;    There were no vitals filed for this visit.   Subjective Assessment - 01/08/21 1237     Subjective  The rain can go away.  (makes pain worse)    Pertinent History R MCA stroke.  PMH:  CKD, DM, malnutrition, HTN, polysubstance abuse, prior stroke 08/21 who presents with spastic left sided hemiplegia    Limitations Fall Risk, LUE Hemi    Patient Stated Goals "get rid of shoulder pain, get this arm right, moving again"    Currently in Pain? Yes    Pain Score 4     Pain Location Shoulder    Pain Orientation Left    Pain Descriptors / Indicators Aching    Pain Type Chronic pain    Pain Onset More than a month ago    Pain Frequency Intermittent    Aggravating Factors  rain    Pain Relieving Factors position changes              Checked STGs--see below.  Pt reports that he continues to perform ROM HEP from prior  OT and then lightly rolling to L side as instructed.  Supine, gentle manual therapy for L shoulder stretch, positioning, and alignment followed by active scapular retraction and then depression with min-mod facilitation.    Sidelying, AAROM shoulder flex in low range with OT providing facilitation for LUE arm and scapular movement.  Sitting, mod cueing for posture AAROM along horizontal surface for shoulder abduction/ER as able and shoulder flex with mod facilitation and while NMES x53min to L wrist/finger extensors, 50pps, 250 pulse width, intensity=26, 10sec on/off time with no adverse reactions.           OT Short Term Goals - 01/08/21 1239       OT SHORT TERM GOAL #1   Title Pt will be independent with new HEP for improving range of motion and decreasing pain in LUE    Baseline needs review from previous time    Time 4    Period Weeks    Status On-going    Target Date 01/02/21       OT SHORT TERM GOAL #2   Title Patient will demonstrate sufficient shoulder range of motion and/or ability to lift arm sufficiently to apply deodorant and wash under left arm with pain no greater than 1-2/10    Baseline unable to lift arm sufficiently    Time 4    Period Weeks    Status On-going   01/08/21:  inconsistent   Target Date 01/02/21      OT SHORT TERM GOAL #3   Title Pt will demonstrate 10% of active flexion and extension in LUE hand for increasing and progressing towards grasp/release    Baseline no volitional movement    Time 4    Period Weeks    Status On-going   01/08/21:  unable/no active movement     OT SHORT TERM GOAL #4   Title Pt will verbalize understanding of splint wear and care instructions for increasing prolonged stretch in LUE hand.    Baseline has custom resting hand splint from previous sessions    Time 4    Period Weeks    Status On-going   01/08/21:  able to wear for 1-2 hrs/day     OT SHORT TERM GOAL #5   Title Patient will demonstrate 90 degrees of passive shoulder flexion in LUE in preparation for forward reaching    Baseline ~70*    Time 4    Period Weeks    Status On-going   01/08/21:  approx 50-70*              OT Long Term Goals - 01/03/21 1525       OT LONG TERM GOAL #1   Title Patient will complete updated HEP designed to improve functional use of LUE    Baseline No HEP    Time 8    Period Weeks    Status On-going      OT LONG TERM GOAL #2   Title Pt will verbalize understanding of adapted strategies and equipment PRN for increasing safety and independence with ADLs and IADLs (cutting food, vegetables, washing dishes, etc)    Baseline No current splints or slings    Time 8    Period Weeks    Status On-going      OT LONG TERM GOAL #3   Title Patient will demonstrate ability to cut food on plate    Baseline Unable to cut up food    Time 8    Period Weeks  Status On-going      OT LONG TERM GOAL #4   Title Pt will  demonstrate ability to stabilize a dish in LUE and wash with RUE    Baseline Unable to cut veggies    Time 8    Period Weeks    Status On-going      OT LONG TERM GOAL #5   Title Patient will demonstrate ability to button/zipper clothing as needed    Baseline Opts to wear clothing without fasteners    Time 8    Period Weeks    Status On-going                   Plan - 01/08/21 1238     Clinical Impression Statement Pt continues to report shoulder tightness and pain, but pt reports decr pain at end of session.  Pt needs cueing for posture and L shoulder positioning.    OT Occupational Profile and History Detailed Assessment- Review of Records and additional review of physical, cognitive, psychosocial history related to current functional performance    Occupational performance deficits (Please refer to evaluation for details): ADL's;IADL's;Rest and Sleep    Body Structure / Function / Physical Skills ADL;Dexterity;Flexibility;Muscle spasms;ROM;Strength;Vision;Tone;IADL;FMC;Edema;Coordination;Balance;Body mechanics;Decreased knowledge of precautions;Endurance;Improper spinal/pelvic alignment;Pain;Sensation;Decreased knowledge of use of DME;GMC;Mobility;Proprioception    Cognitive Skills Attention;Memory;Perception;Energy/Drive;Problem Solve;Safety Awareness    Rehab Potential Good    Clinical Decision Making Several treatment options, min-mod task modification necessary    Comorbidities Affecting Occupational Performance: May have comorbidities impacting occupational performance    Modification or Assistance to Complete Evaluation  Min-Moderate modification of tasks or assist with assess necessary to complete eval    OT Frequency 1x / week    OT Duration 8 weeks   12 visits over 8 weeks d/t any scheduling conflicts   OT Treatment/Interventions Self-care/ADL training;Therapeutic exercise;DME and/or AE instruction;Functional Mobility Training;Cognitive remediation/compensation;Balance  training;Visual/perceptual remediation/compensation;Splinting;Manual Therapy;Neuromuscular education;Fluidtherapy;Electrical Stimulation;Aquatic Barista;Iontophoresis;Passive range of motion;Therapeutic activities;Patient/family education    Plan supine manual therapy, self PROM, bed positioning LUE    Consulted and Agree with Plan of Care Patient             Patient will benefit from skilled therapeutic intervention in order to improve the following deficits and impairments:   Body Structure / Function / Physical Skills: ADL, Dexterity, Flexibility, Muscle spasms, ROM, Strength, Vision, Tone, IADL, FMC, Edema, Coordination, Balance, Body mechanics, Decreased knowledge of precautions, Endurance, Improper spinal/pelvic alignment, Pain, Sensation, Decreased knowledge of use of DME, GMC, Mobility, Proprioception Cognitive Skills: Attention, Memory, Perception, Energy/Drive, Problem Solve, Safety Awareness     Visit Diagnosis: Spastic 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 (HCC)  Chronic left shoulder pain  Stiffness of left hand, not elsewhere classified  Stiffness of left shoulder, not elsewhere classified    Problem List Patient Active Problem List   Diagnosis Date Noted   Internal carotid artery stenosis, bilateral 06/21/2020   Slow transit constipation    Spastic hemiplegia affecting nondominant side (HCC)    Stage 3b chronic kidney disease ([facility])    Essential hypertension    Controlled type 2 diabetes mellitus with hyperglycemia, without long-term current use of insulin ([facility])    Protein-calorie malnutrition, severe 04/25/2020   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 [place], [place], [address] Phone: [phone]   Fax:  [phone]  Name: Bobby Branch MRN: [id number] Date of Birth: December 02, 1960   Vianne Bulls, OTR/L [facility] [address] [place], [place]  [address] [phone] phone [phone] 01/08/21 3:00 PM

## 2021-01-09 ENCOUNTER — Other Ambulatory Visit: Payer: Federal, State, Local not specified - PPO

## 2021-01-09 NOTE — Telephone Encounter (Signed)
PCS form has been completed.

## 2021-01-10 ENCOUNTER — Ambulatory Visit: Payer: Federal, State, Local not specified - PPO | Admitting: Occupational Therapy

## 2021-01-10 ENCOUNTER — Other Ambulatory Visit: Payer: Self-pay

## 2021-01-10 ENCOUNTER — Ambulatory Visit: Payer: Federal, State, Local not specified - PPO

## 2021-01-10 ENCOUNTER — Encounter: Payer: Self-pay | Admitting: Occupational Therapy

## 2021-01-10 ENCOUNTER — Other Ambulatory Visit: Payer: Self-pay | Admitting: Physician Assistant

## 2021-01-10 DIAGNOSIS — M25642 Stiffness of left hand, not elsewhere classified: Secondary | ICD-10-CM

## 2021-01-10 DIAGNOSIS — M6281 Muscle weakness (generalized): Secondary | ICD-10-CM

## 2021-01-10 DIAGNOSIS — G8929 Other chronic pain: Secondary | ICD-10-CM

## 2021-01-10 DIAGNOSIS — I69354 Hemiplegia and hemiparesis following cerebral infarction affecting left non-dominant side: Secondary | ICD-10-CM

## 2021-01-10 DIAGNOSIS — R2681 Unsteadiness on feet: Secondary | ICD-10-CM

## 2021-01-10 DIAGNOSIS — M25512 Pain in left shoulder: Secondary | ICD-10-CM

## 2021-01-10 DIAGNOSIS — M25612 Stiffness of left shoulder, not elsewhere classified: Secondary | ICD-10-CM

## 2021-01-10 MED ORDER — ALBUTEROL SULFATE HFA 108 (90 BASE) MCG/ACT IN AERS
1.0000 | INHALATION_SPRAY | Freq: Four times a day (QID) | RESPIRATORY_TRACT | 2 refills | Status: DC | PRN
  Filled 2021-01-10: qty 8.5, 25d supply, fill #0

## 2021-01-10 NOTE — Therapy (Signed)
[facility] [address] [place], [place], [address] Phone: [phone]   Fax:  [phone]  Occupational Therapy Treatment and Discharge  Patient Details  Name: Bobby Branch MRN: [id number] Date of Birth: March 08, 1960 Referring Provider (OT): Hoy Register, MD   Encounter Date: 01/10/2021   OT End of Session - 01/10/21 1455     Visit Number 9    Number of Visits 13    Date for OT Re-Evaluation 01/30/21    Authorization Type [employer] (used 7 visits prior POC)    Progress Note Due on Visit 10    OT Start Time 1400             Past Medical History:  Diagnosis Date   Anxiety    Depression    Diabetes mellitus without complication (HCC)    type 2   Dyspnea    inhaler   GERD (gastroesophageal reflux disease)    History of kidney stones    HLD (hyperlipidemia)    Hypertension    Stroke (HCC) 09/2019   left arm and leg weak    Past Surgical History:  Procedure Laterality Date   EYE SURGERY Left    IR ANGIO EXTRACRAN SEL COM CAROTID INNOMINATE UNI L MOD SED  04/20/2020   IR ANGIO INTRA EXTRACRAN SEL COM CAROTID INNOMINATE UNI L MOD SED  06/21/2020   IR ANGIO VERTEBRAL SEL SUBCLAVIAN INNOMINATE UNI R MOD SED  06/21/2020   IR CT HEAD LTD  04/20/2020   IR INTRAVSC STENT CERV CAROTID W/EMB-PROT MOD SED INCL ANGIO  06/21/2020   IR PERCUTANEOUS ART THROMBECTOMY/INFUSION INTRACRANIAL INC DIAG ANGIO  04/20/2020   IR RADIOLOGIST EVAL & MGMT  06/05/2020   IR RADIOLOGIST EVAL & MGMT  07/18/2020   IR RADIOLOGIST EVAL & MGMT  10/23/2020   IR US GUIDE VASC ACCESS RIGHT  04/20/2020   IR US GUIDE VASC ACCESS RIGHT  06/21/2020   RADIOLOGY WITH ANESTHESIA N/A 04/20/2020   Procedure: IR WITH ANESTHESIA;  Surgeon: Julieanne Cotton, MD;  Location: [facility] OR;  Service: Radiology;  Laterality: N/A;   RADIOLOGY WITH ANESTHESIA N/A 06/21/2020   Procedure: RADIOLOGY WITH ANESTHESIA  STENTING;  Surgeon: Gilmer Mor, DO;  Location:  [facility] OR;  Service: Anesthesiology;  Laterality: N/A;    There were no vitals filed for this visit.   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  Need help with cooking and cleaning.  May need to move back to [place].    Pertinent History R MCA stroke.  PMH:  CKD, DM, malnutrition, HTN, polysubstance abuse, prior stroke 08/21 who presents with spastic left sided hemiplegia    Limitations Fall Risk, LUE Hemi    Patient Stated Goals "get rid of shoulder pain, get this arm right, moving again"    Currently in Pain? No/denies    Pain Score 0-No pain                          OT Treatments/Exercises (OP) - 01/10/21 0001       ADLs   Medication Management Patient having trouble navigating through medication refills.  Easily frustrated then cannot work around any challenges.  Discussed options to help him successfully get questions asked and answered regarding med refills.    ADL Comments Reviewed compensatory strategies patient is using to cut food on plate, wash dishes.  Neurological Re-education Exercises   Other Exercises 1 Reviewed HEP and pain management strategies for left arm.  Provided stretch to left shoulder - flexion, abduction, external rotation, and elbow extension.                    OT Education - 01/10/21 1455     Education Details end of therapy, reviewed goals    [name]    Methods Explanation    Comprehension Verbalized understanding              OT Short Term Goals - 01/10/21 1415       OT SHORT TERM GOAL #1   Title Pt will be independent with new HEP for improving range of motion and decreasing pain in LUE    Baseline needs review from previous time    Time 4    Period Weeks    Status Achieved    Target Date 01/02/21      OT SHORT TERM GOAL #2   Title Patient will demonstrate sufficient shoulder range of motion and/or ability to lift arm  sufficiently to apply deodorant and wash under left arm with pain no greater than 1-2/10    Baseline unable to lift arm sufficiently    Time 4    Period Weeks    Status Achieved   01/08/21:  inconsistent   Target Date 01/02/21      OT SHORT TERM GOAL #3   Title Pt will demonstrate 10% of active flexion and extension in LUE hand for increasing and progressing towards grasp/release    Baseline no volitional movement    Time 4    Period Weeks    Status Not Met   01/08/21:  unable/no active movement     OT SHORT TERM GOAL #4   Title Pt will verbalize understanding of splint wear and care instructions for increasing prolonged stretch in LUE hand.    Baseline has custom resting hand splint from previous sessions    Time 4    Period Weeks    Status Achieved   01/08/21:  able to wear for 1-2 hrs/day     OT SHORT TERM GOAL #5   Title Patient will demonstrate 90 degrees of passive shoulder flexion in LUE in preparation for forward reaching    Baseline ~70*    Time 4    Period Weeks    Status Achieved   01/08/21:  approx 50-70*              OT Long Term Goals - 01/10/21 1421       OT LONG TERM GOAL #1   Title Patient will complete updated HEP designed to improve functional use of LUE    Baseline No HEP    Time 8    Period Weeks    Status Not Met      OT LONG TERM GOAL #2   Title Pt will verbalize understanding of adapted strategies and equipment PRN for increasing safety and independence with ADLs and IADLs (cutting food, vegetables, washing dishes, etc)    Baseline No current splints or slings    Time 8    Period Weeks    Status Achieved      OT LONG TERM GOAL #3   Title Patient will demonstrate ability to cut food on plate    Baseline Unable to cut up food    Time 8    Period Weeks    Status Achieved  OT LONG TERM GOAL #4   Title Pt will demonstrate ability to stabilize a dish in LUE and wash with RUE    Baseline Unable to cut veggies    Time 8    Period Weeks     Status Not Met      OT LONG TERM GOAL #5   Title Patient will demonstrate ability to button/zipper clothing as needed    Baseline Opts to wear clothing without fasteners    Time 8    Period Weeks    Status Not Met                   Plan - 01/10/21 1459     Clinical Impression Statement Pt is opting to take a break from therapy at this time.    OT Occupational Profile and History Detailed Assessment- Review of Records and additional review of physical, cognitive, psychosocial history related to current functional performance    Occupational performance deficits (Please refer to evaluation for details): ADL's;IADL's;Rest and Sleep    Body Structure / Function / Physical Skills ADL;Dexterity;Flexibility;Muscle spasms;ROM;Strength;Vision;Tone;IADL;FMC;Edema;Coordination;Balance;Body mechanics;Decreased knowledge of precautions;Endurance;Improper spinal/pelvic alignment;Pain;Sensation;Decreased knowledge of use of DME;GMC;Mobility;Proprioception    Cognitive Skills Attention;Memory;Perception;Energy/Drive;Problem Solve;Safety Awareness    Rehab Potential Good    Clinical Decision Making Several treatment options, min-mod task modification necessary    Comorbidities Affecting Occupational Performance: May have comorbidities impacting occupational performance    Modification or Assistance to Complete Evaluation  Min-Moderate modification of tasks or assist with assess necessary to complete eval    OT Frequency 1x / week    OT Duration 8 weeks   12 visits over 8 weeks d/t any scheduling conflicts   OT Treatment/Interventions Self-care/ADL training;Therapeutic exercise;DME and/or AE instruction;Functional Mobility Training;Cognitive remediation/compensation;Balance training;Visual/perceptual remediation/compensation;Splinting;Manual Therapy;Neuromuscular education;Fluidtherapy;Electrical Stimulation;Aquatic Barista;Iontophoresis;Passive range of motion;Therapeutic  activities;Patient/family education    Plan discharge    Consulted and Agree with Plan of Care Patient             Patient will benefit from skilled therapeutic intervention in order to improve the following deficits and impairments:   Body Structure / Function / Physical Skills: ADL, Dexterity, Flexibility, Muscle spasms, ROM, Strength, Vision, Tone, IADL, FMC, Edema, Coordination, Balance, Body mechanics, Decreased knowledge of precautions, Endurance, Improper spinal/pelvic alignment, Pain, Sensation, Decreased knowledge of use of DME, GMC, Mobility, Proprioception Cognitive Skills: Attention, Memory, Perception, Energy/Drive, Problem Solve, Safety Awareness     Visit Diagnosis: Spastic 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 (HCC)  Chronic left shoulder pain  Stiffness of left hand, not elsewhere classified  Stiffness of left shoulder, not elsewhere classified  Unsteadiness on feet  Muscle weakness (generalized)    Problem List Patient Active Problem List   Diagnosis Date Noted   Internal carotid artery stenosis, bilateral 06/21/2020   Slow transit constipation    Spastic hemiplegia affecting nondominant side (HCC)    Stage 3b chronic kidney disease ([place])    Essential hypertension    Controlled type 2 diabetes mellitus with hyperglycemia, without long-term current use of insulin ([place])    Protein-calorie malnutrition, severe 04/25/2020   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 Patient goals were partially met. Patient  is being discharged due to financial reasons.Mariah Milling, OT 01/10/2021, 3:03 PM  [facility] [address] [place], [place], [address] Phone: [phone]   Fax:  [phone]  Name: Cochise Dinneen MRN: [id number] Date of Birth: 18-Jan-1961

## 2021-01-11 ENCOUNTER — Other Ambulatory Visit: Payer: Self-pay

## 2021-01-11 ENCOUNTER — Ambulatory Visit (INDEPENDENT_AMBULATORY_CARE_PROVIDER_SITE_OTHER): Payer: Federal, State, Local not specified - PPO

## 2021-01-11 ENCOUNTER — Ambulatory Visit (HOSPITAL_COMMUNITY)
Admission: EM | Admit: 2021-01-11 | Discharge: 2021-01-11 | Disposition: A | Discharge status: Home / Self Care | Payer: Federal, State, Local not specified - PPO | Attending: Family Medicine | Admitting: Family Medicine

## 2021-01-11 ENCOUNTER — Encounter (HOSPITAL_COMMUNITY): Payer: Self-pay | Admitting: *Deleted

## 2021-01-11 DIAGNOSIS — M542 Cervicalgia: Secondary | ICD-10-CM

## 2021-01-11 DIAGNOSIS — I10 Essential (primary) hypertension: Secondary | ICD-10-CM

## 2021-01-11 MED ORDER — ALBUTEROL SULFATE HFA 108 (90 BASE) MCG/ACT IN AERS
1.0000 | INHALATION_SPRAY | Freq: Four times a day (QID) | RESPIRATORY_TRACT | 1 refills | Status: DC | PRN
  Filled 2021-01-11: qty 18, 30d supply, fill #0

## 2021-01-11 MED ORDER — ALBUTEROL SULFATE HFA 108 (90 BASE) MCG/ACT IN AERS
2.0000 | INHALATION_SPRAY | Freq: Four times a day (QID) | RESPIRATORY_TRACT | 0 refills | Status: DC | PRN
  Filled 2021-01-11: qty 8, fill #0
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
## 2021-01-11 NOTE — Discharge Instructions (Addendum)
Continue the tizanidine as needed for the muscle spasm, and the heating pad on that sore left neck muscle can help.  Try doing some of the neck stretches. Keep your appointment with Dr. Margarita Rana. Continue checking your blood pressure regularly, and let them know if not less than 140/90 consistently. You can also take extra strength tylenol as needed for the pain
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
## 2021-01-15 ENCOUNTER — Ambulatory Visit: Payer: Federal, State, Local not specified - PPO | Admitting: Physical Therapy

## 2021-01-15 ENCOUNTER — Encounter: Payer: Federal, State, Local not specified - PPO | Admitting: Occupational Therapy

## 2021-01-15 ENCOUNTER — Ambulatory Visit: Payer: Federal, State, Local not specified - PPO

## 2021-01-17 ENCOUNTER — Encounter: Payer: Federal, State, Local not specified - PPO | Admitting: Occupational Therapy

## 2021-01-17 ENCOUNTER — Ambulatory Visit: Payer: Federal, State, Local not specified - PPO

## 2021-01-21 ENCOUNTER — Telehealth (HOSPITAL_COMMUNITY): Payer: Self-pay

## 2021-01-21 ENCOUNTER — Other Ambulatory Visit: Payer: Self-pay | Admitting: Radiology

## 2021-01-21 DIAGNOSIS — I771 Stricture of artery: Secondary | ICD-10-CM

## 2021-01-21 NOTE — Telephone Encounter (Signed)
Called pt to let him know that the PA will put in a consult to see about getting him Plavix for his upcoming procedure with Dr. Earleen Newport. Once he gets the medicine, we can get him scheduled. AW

## 2021-01-22 ENCOUNTER — Ambulatory Visit: Payer: Federal, State, Local not specified - PPO

## 2021-01-22 ENCOUNTER — Encounter: Payer: Federal, State, Local not specified - PPO | Admitting: Occupational Therapy

## 2021-01-24 ENCOUNTER — Ambulatory Visit: Payer: Federal, State, Local not specified - PPO

## 2021-01-24 ENCOUNTER — Encounter: Payer: Federal, State, Local not specified - PPO | Admitting: Occupational Therapy

## 2021-01-27 ENCOUNTER — Other Ambulatory Visit: Payer: Self-pay

## 2021-01-27 ENCOUNTER — Emergency Department (HOSPITAL_COMMUNITY): Payer: Federal, State, Local not specified - PPO

## 2021-01-27 ENCOUNTER — Inpatient Hospital Stay (HOSPITAL_COMMUNITY)
Admission: EM | Admit: 2021-01-27 | Discharge: 2021-02-04 | DRG: 035 | Disposition: A | Discharge status: Home / Self Care | Payer: Federal, State, Local not specified - PPO | Attending: Internal Medicine | Admitting: Internal Medicine

## 2021-01-27 DIAGNOSIS — R202 Paresthesia of skin: Secondary | ICD-10-CM

## 2021-01-27 DIAGNOSIS — E1165 Type 2 diabetes mellitus with hyperglycemia: Secondary | ICD-10-CM | POA: Diagnosis present

## 2021-01-27 DIAGNOSIS — Z7982 Long term (current) use of aspirin: Secondary | ICD-10-CM

## 2021-01-27 DIAGNOSIS — M25512 Pain in left shoulder: Secondary | ICD-10-CM | POA: Diagnosis present

## 2021-01-27 DIAGNOSIS — N1832 Chronic kidney disease, stage 3b: Secondary | ICD-10-CM | POA: Diagnosis present

## 2021-01-27 DIAGNOSIS — E1122 Type 2 diabetes mellitus with diabetic chronic kidney disease: Secondary | ICD-10-CM | POA: Diagnosis present

## 2021-01-27 DIAGNOSIS — K219 Gastro-esophageal reflux disease without esophagitis: Secondary | ICD-10-CM | POA: Diagnosis present

## 2021-01-27 DIAGNOSIS — K5901 Slow transit constipation: Secondary | ICD-10-CM | POA: Diagnosis present

## 2021-01-27 DIAGNOSIS — I6523 Occlusion and stenosis of bilateral carotid arteries: Secondary | ICD-10-CM | POA: Diagnosis not present

## 2021-01-27 DIAGNOSIS — Z7902 Long term (current) use of antithrombotics/antiplatelets: Secondary | ICD-10-CM

## 2021-01-27 DIAGNOSIS — I6522 Occlusion and stenosis of left carotid artery: Secondary | ICD-10-CM | POA: Diagnosis present

## 2021-01-27 DIAGNOSIS — J3489 Other specified disorders of nose and nasal sinuses: Secondary | ICD-10-CM | POA: Diagnosis not present

## 2021-01-27 DIAGNOSIS — F17210 Nicotine dependence, cigarettes, uncomplicated: Secondary | ICD-10-CM | POA: Diagnosis present

## 2021-01-27 DIAGNOSIS — E785 Hyperlipidemia, unspecified: Secondary | ICD-10-CM | POA: Diagnosis present

## 2021-01-27 DIAGNOSIS — G8110 Spastic hemiplegia affecting unspecified side: Secondary | ICD-10-CM | POA: Diagnosis present

## 2021-01-27 DIAGNOSIS — F1910 Other psychoactive substance abuse, uncomplicated: Secondary | ICD-10-CM | POA: Diagnosis present

## 2021-01-27 DIAGNOSIS — Z20822 Contact with and (suspected) exposure to covid-19: Secondary | ICD-10-CM | POA: Diagnosis present

## 2021-01-27 DIAGNOSIS — I771 Stricture of artery: Secondary | ICD-10-CM

## 2021-01-27 DIAGNOSIS — F419 Anxiety disorder, unspecified: Secondary | ICD-10-CM | POA: Diagnosis present

## 2021-01-27 DIAGNOSIS — I69392 Facial weakness following cerebral infarction: Secondary | ICD-10-CM

## 2021-01-27 DIAGNOSIS — G8929 Other chronic pain: Secondary | ICD-10-CM | POA: Diagnosis present

## 2021-01-27 DIAGNOSIS — F32A Depression, unspecified: Secondary | ICD-10-CM

## 2021-01-27 DIAGNOSIS — I708 Atherosclerosis of other arteries: Secondary | ICD-10-CM | POA: Diagnosis present

## 2021-01-27 DIAGNOSIS — G459 Transient cerebral ischemic attack, unspecified: Secondary | ICD-10-CM | POA: Diagnosis present

## 2021-01-27 DIAGNOSIS — Z95820 Peripheral vascular angioplasty status with implants and grafts: Secondary | ICD-10-CM

## 2021-01-27 DIAGNOSIS — I10 Essential (primary) hypertension: Secondary | ICD-10-CM | POA: Diagnosis present

## 2021-01-27 DIAGNOSIS — Z87442 Personal history of urinary calculi: Secondary | ICD-10-CM

## 2021-01-27 DIAGNOSIS — I69354 Hemiplegia and hemiparesis following cerebral infarction affecting left non-dominant side: Secondary | ICD-10-CM

## 2021-01-27 DIAGNOSIS — Z79899 Other long term (current) drug therapy: Secondary | ICD-10-CM

## 2021-01-27 DIAGNOSIS — E11649 Type 2 diabetes mellitus with hypoglycemia without coma: Secondary | ICD-10-CM | POA: Diagnosis not present

## 2021-01-27 DIAGNOSIS — Z9114 Patient's other noncompliance with medication regimen: Secondary | ICD-10-CM

## 2021-01-27 DIAGNOSIS — I129 Hypertensive chronic kidney disease with stage 1 through stage 4 chronic kidney disease, or unspecified chronic kidney disease: Secondary | ICD-10-CM | POA: Diagnosis present

## 2021-01-27 LAB — CBC WITH DIFFERENTIAL/PLATELET
Abs Immature Granulocytes: 0.02 10*3/uL (ref 0.00–0.07)
Basophils Absolute: 0 10*3/uL (ref 0.0–0.1)
Basophils Relative: 1 %
Eosinophils Absolute: 0.4 10*3/uL (ref 0.0–0.5)
Eosinophils Relative: 5 %
HCT: 39.7 % (ref 39.0–52.0)
Hemoglobin: 13.1 g/dL (ref 13.0–17.0)
Immature Granulocytes: 0 %
Lymphocytes Relative: 24 %
Lymphs Abs: 1.9 10*3/uL (ref 0.7–4.0)
MCH: 31.6 pg (ref 26.0–34.0)
MCHC: 33 g/dL (ref 30.0–36.0)
MCV: 95.7 fL (ref 80.0–100.0)
Monocytes Absolute: 0.6 10*3/uL (ref 0.1–1.0)
Monocytes Relative: 7 %
Neutro Abs: 5 10*3/uL (ref 1.7–7.7)
Neutrophils Relative %: 63 %
Platelets: 339 10*3/uL (ref 150–400)
RBC: 4.15 MIL/uL — ABNORMAL LOW (ref 4.22–5.81)
RDW: 13.7 % (ref 11.5–15.5)
WBC: 8 10*3/uL (ref 4.0–10.5)
nRBC: 0 % (ref 0.0–0.2)

## 2021-01-27 LAB — COMPREHENSIVE METABOLIC PANEL
ALT: 22 U/L (ref 0–44)
AST: 23 U/L (ref 15–41)
Albumin: 3.3 g/dL — ABNORMAL LOW (ref 3.5–5.0)
Alkaline Phosphatase: 71 U/L (ref 38–126)
Anion gap: 5 (ref 5–15)
BUN: 19 mg/dL (ref 6–20)
CO2: 27 mmol/L (ref 22–32)
Calcium: 8.9 mg/dL (ref 8.9–10.3)
Chloride: 107 mmol/L (ref 98–111)
Creatinine, Ser: 2.03 mg/dL — ABNORMAL HIGH (ref 0.61–1.24)
GFR, Estimated: 37 mL/min — ABNORMAL LOW (ref >60)
Glucose, Bld: 74 mg/dL (ref 70–99)
Potassium: 4.3 mmol/L (ref 3.5–5.1)
Sodium: 139 mmol/L (ref 135–145)
Total Bilirubin: 0.5 mg/dL (ref 0.3–1.2)
Total Protein: 6.8 g/dL (ref 6.5–8.1)

## 2021-01-27 NOTE — ED Provider Notes (Signed)
Emergency Medicine Provider Triage Evaluation Note  Bobby Branch , a 60 y.o. male  was evaluated in triage.  Pt complains of hypertension and right-sided numbness of the scalp.  Patient states that he ran out of his amlodipine and Plavix about 1 month ago and he did not have them refilled.  He had a stroke in either February or March of this year with residual left-sided deficits including left-sided facial droop and left arm weakness.  Currently he has right-sided headache, numbness as well is endorsing vision changes.  He has been reporting a blood pressure of around 200/100 at home.  He denies abdominal pain, nausea, vomiting, diarrhea, chest pain.  Review of Systems  Positive: Headache, vision changes Negative: New weakness, worsening facial droop, abdominal pain, N/V/D  Physical Exam  BP (!) 206/98    Pulse 66    Temp 97.6 F (36.4 C)    Resp 16    Ht 5\' 5"  (1.651 m)    Wt 62.1 kg    SpO2 98%    BMI 22.80 kg/m  Gen:   Awake, no distress   Resp:  Normal effort  MSK:   Moves extremities without difficulty  Other:  Patient's right side is neurovascularly intact with 5/5 grip strength.  Patient is unable to perform grip strength on left side due to previous stroke deficits.  He has residual right-sided facial droop.  EOM is intact.  Medical Decision Making  Medically screening exam initiated at 11:12 PM.  Appropriate orders placed.  [name] was informed that the remainder of the evaluation will be completed by another provider, this initial triage assessment does not replace that evaluation, and the importance of remaining in the ED until their evaluation is complete.  Patient is not having acute stroke symptoms at this time.  We will proceed with head CT and labs.   Tonye Pearson, [place] 01/27/21 2315    Teressa Lower, MD 01/28/21 [phone]

## 2021-01-27 NOTE — ED Triage Notes (Signed)
Pt c/o hypertension and numbness on the right side of his head. Pt states he hasn't taken his blood pressure meds in a while.

## 2021-01-28 ENCOUNTER — Emergency Department (HOSPITAL_COMMUNITY): Payer: Federal, State, Local not specified - PPO

## 2021-01-28 ENCOUNTER — Encounter (HOSPITAL_COMMUNITY): Payer: Self-pay

## 2021-01-28 DIAGNOSIS — G459 Transient cerebral ischemic attack, unspecified: Secondary | ICD-10-CM | POA: Diagnosis present

## 2021-01-28 DIAGNOSIS — G8110 Spastic hemiplegia affecting unspecified side: Secondary | ICD-10-CM

## 2021-01-28 DIAGNOSIS — F1910 Other psychoactive substance abuse, uncomplicated: Secondary | ICD-10-CM | POA: Diagnosis present

## 2021-01-28 DIAGNOSIS — Z9114 Patient's other noncompliance with medication regimen: Secondary | ICD-10-CM | POA: Diagnosis not present

## 2021-01-28 DIAGNOSIS — I6522 Occlusion and stenosis of left carotid artery: Secondary | ICD-10-CM | POA: Diagnosis present

## 2021-01-28 DIAGNOSIS — N1832 Chronic kidney disease, stage 3b: Secondary | ICD-10-CM | POA: Diagnosis present

## 2021-01-28 DIAGNOSIS — I129 Hypertensive chronic kidney disease with stage 1 through stage 4 chronic kidney disease, or unspecified chronic kidney disease: Secondary | ICD-10-CM | POA: Diagnosis present

## 2021-01-28 DIAGNOSIS — K219 Gastro-esophageal reflux disease without esophagitis: Secondary | ICD-10-CM | POA: Diagnosis present

## 2021-01-28 DIAGNOSIS — F32A Depression, unspecified: Secondary | ICD-10-CM

## 2021-01-28 DIAGNOSIS — E1122 Type 2 diabetes mellitus with diabetic chronic kidney disease: Secondary | ICD-10-CM | POA: Diagnosis present

## 2021-01-28 DIAGNOSIS — G8929 Other chronic pain: Secondary | ICD-10-CM | POA: Diagnosis present

## 2021-01-28 DIAGNOSIS — E785 Hyperlipidemia, unspecified: Secondary | ICD-10-CM | POA: Diagnosis present

## 2021-01-28 DIAGNOSIS — Z20822 Contact with and (suspected) exposure to covid-19: Secondary | ICD-10-CM | POA: Diagnosis present

## 2021-01-28 DIAGNOSIS — E1165 Type 2 diabetes mellitus with hyperglycemia: Secondary | ICD-10-CM | POA: Diagnosis present

## 2021-01-28 DIAGNOSIS — I69354 Hemiplegia and hemiparesis following cerebral infarction affecting left non-dominant side: Secondary | ICD-10-CM | POA: Diagnosis not present

## 2021-01-28 DIAGNOSIS — K5901 Slow transit constipation: Secondary | ICD-10-CM | POA: Diagnosis present

## 2021-01-28 DIAGNOSIS — Z87442 Personal history of urinary calculi: Secondary | ICD-10-CM | POA: Diagnosis not present

## 2021-01-28 DIAGNOSIS — I69392 Facial weakness following cerebral infarction: Secondary | ICD-10-CM | POA: Diagnosis not present

## 2021-01-28 DIAGNOSIS — R202 Paresthesia of skin: Secondary | ICD-10-CM | POA: Diagnosis present

## 2021-01-28 DIAGNOSIS — I10 Essential (primary) hypertension: Secondary | ICD-10-CM | POA: Diagnosis not present

## 2021-01-28 DIAGNOSIS — Z95820 Peripheral vascular angioplasty status with implants and grafts: Secondary | ICD-10-CM | POA: Diagnosis not present

## 2021-01-28 DIAGNOSIS — F17210 Nicotine dependence, cigarettes, uncomplicated: Secondary | ICD-10-CM | POA: Diagnosis present

## 2021-01-28 DIAGNOSIS — M25512 Pain in left shoulder: Secondary | ICD-10-CM | POA: Diagnosis present

## 2021-01-28 DIAGNOSIS — I6523 Occlusion and stenosis of bilateral carotid arteries: Secondary | ICD-10-CM | POA: Diagnosis present

## 2021-01-28 DIAGNOSIS — E11649 Type 2 diabetes mellitus with hypoglycemia without coma: Secondary | ICD-10-CM | POA: Diagnosis not present

## 2021-01-28 DIAGNOSIS — Z7902 Long term (current) use of antithrombotics/antiplatelets: Secondary | ICD-10-CM | POA: Diagnosis not present

## 2021-01-28 DIAGNOSIS — I708 Atherosclerosis of other arteries: Secondary | ICD-10-CM | POA: Diagnosis present

## 2021-01-28 DIAGNOSIS — Z7982 Long term (current) use of aspirin: Secondary | ICD-10-CM | POA: Diagnosis not present

## 2021-01-28 LAB — URINALYSIS, ROUTINE W REFLEX MICROSCOPIC
Bilirubin Urine: NEGATIVE
Glucose, UA: NEGATIVE mg/dL
Ketones, ur: NEGATIVE mg/dL
Leukocytes,Ua: NEGATIVE
Nitrite: NEGATIVE
Protein, ur: >300 mg/dL — AB
Specific Gravity, Urine: 1.025 (ref 1.005–1.030)
pH: 6 (ref 5.0–8.0)

## 2021-01-28 LAB — GLUCOSE, CAPILLARY
Glucose-Capillary: 55 mg/dL — ABNORMAL LOW (ref 70–99)
Glucose-Capillary: 139 mg/dL — ABNORMAL HIGH (ref 70–99)

## 2021-01-28 LAB — URINALYSIS, MICROSCOPIC (REFLEX)

## 2021-01-28 LAB — RESP PANEL BY RT-PCR (FLU A&B, COVID) ARPGX2
Influenza A by PCR: NEGATIVE
Influenza B by PCR: NEGATIVE
SARS Coronavirus 2 by RT PCR: NEGATIVE

## 2021-01-28 LAB — SEDIMENTATION RATE: Sed Rate: 50 mm/hr — ABNORMAL HIGH (ref 0–16)

## 2021-01-28 LAB — CBG MONITORING, ED: Glucose-Capillary: 235 mg/dL — ABNORMAL HIGH (ref 70–99)

## 2021-01-28 LAB — TROPONIN I (HIGH SENSITIVITY)
Troponin I (High Sensitivity): 9 ng/L (ref <18)
Troponin I (High Sensitivity): 10 ng/L (ref <18)

## 2021-01-28 MED ORDER — LOSARTAN POTASSIUM 25 MG PO TABS
25.0000 mg | ORAL_TABLET | Freq: Two times a day (BID) | ORAL | Status: DC
  Administered 2021-01-28 – 2021-02-04 (×15): 25 mg via ORAL
  Filled 2021-01-28 (×15): qty 1

## 2021-01-28 MED ORDER — ATORVASTATIN CALCIUM 40 MG PO TABS
40.0000 mg | ORAL_TABLET | Freq: Every day | ORAL | Status: DC
  Administered 2021-01-28 – 2021-01-29 (×2): 40 mg via ORAL
  Filled 2021-01-28 (×2): qty 1

## 2021-01-28 MED ORDER — TIZANIDINE HCL 4 MG PO TABS: 4.0000 mg | ORAL_TABLET | Freq: Three times a day (TID) | ORAL | Status: DC

## 2021-01-28 MED ORDER — SODIUM CHLORIDE 0.9% FLUSH
3.0000 mL | Freq: Two times a day (BID) | INTRAVENOUS | Status: DC
  Administered 2021-01-28 – 2021-02-04 (×14): 3 mL via INTRAVENOUS

## 2021-01-28 MED ORDER — DICLOFENAC SODIUM 1 % EX GEL
2.0000 g | Freq: Four times a day (QID) | CUTANEOUS | Status: DC
  Administered 2021-01-29 – 2021-02-04 (×24): 2 g via TOPICAL
  Filled 2021-01-28 (×2): qty 100

## 2021-01-28 MED ORDER — SODIUM CHLORIDE 0.9% FLUSH: 3.0000 mL | INTRAVENOUS | Status: DC | PRN

## 2021-01-28 MED ORDER — FAMOTIDINE 20 MG PO TABS
20.0000 mg | ORAL_TABLET | Freq: Every day | ORAL | Status: DC
  Administered 2021-01-28 – 2021-02-04 (×8): 20 mg via ORAL
  Filled 2021-01-28 (×8): qty 1

## 2021-01-28 MED ORDER — ACETAMINOPHEN 325 MG PO TABS
650.0000 mg | ORAL_TABLET | ORAL | Status: DC | PRN
  Administered 2021-01-29 – 2021-02-04 (×18): 650 mg via ORAL
  Filled 2021-01-28 (×19): qty 2

## 2021-01-28 MED ORDER — NICOTINE 21 MG/24HR TD PT24
21.0000 mg | MEDICATED_PATCH | Freq: Every day | TRANSDERMAL | Status: DC
  Administered 2021-01-28 – 2021-02-04 (×8): 21 mg via TRANSDERMAL
  Filled 2021-01-28 (×8): qty 1

## 2021-01-28 MED ORDER — DULOXETINE HCL 60 MG PO CPEP
60.0000 mg | ORAL_CAPSULE | Freq: Every day | ORAL | Status: DC
  Administered 2021-01-28 – 2021-02-04 (×8): 60 mg via ORAL
  Filled 2021-01-28 (×8): qty 1

## 2021-01-28 MED ORDER — ADULT MULTIVITAMIN W/MINERALS CH
1.0000 | ORAL_TABLET | Freq: Every day | ORAL | Status: DC
  Administered 2021-01-28 – 2021-02-04 (×8): 1 via ORAL
  Filled 2021-01-28 (×8): qty 1

## 2021-01-28 MED ORDER — ACETAMINOPHEN 160 MG/5ML PO SOLN
650.0000 mg | ORAL | Status: DC | PRN
  Administered 2021-01-30: 11:00:00 650 mg
  Filled 2021-01-28: qty 20.3

## 2021-01-28 MED ORDER — SODIUM CHLORIDE 0.9 % IV SOLN: 250.0000 mL | INTRAVENOUS | Status: DC | PRN

## 2021-01-28 MED ORDER — CLOPIDOGREL BISULFATE 300 MG PO TABS
300.0000 mg | ORAL_TABLET | Freq: Once | ORAL | Status: AC
  Administered 2021-01-28: 15:00:00 300 mg via ORAL
  Filled 2021-01-28: qty 1

## 2021-01-28 MED ORDER — SENNOSIDES-DOCUSATE SODIUM 8.6-50 MG PO TABS
1.0000 | ORAL_TABLET | Freq: Every evening | ORAL | Status: DC | PRN
  Administered 2021-01-28 – 2021-02-01 (×2): 1 via ORAL
  Filled 2021-01-28 (×2): qty 1

## 2021-01-28 MED ORDER — ALBUTEROL SULFATE (2.5 MG/3ML) 0.083% IN NEBU
3.0000 mL | INHALATION_SOLUTION | Freq: Four times a day (QID) | RESPIRATORY_TRACT | Status: DC | PRN
  Administered 2021-02-03: 3 mL via RESPIRATORY_TRACT
  Filled 2021-01-28: qty 3

## 2021-01-28 MED ORDER — HYDRALAZINE HCL 50 MG PO TABS
50.0000 mg | ORAL_TABLET | Freq: Two times a day (BID) | ORAL | Status: DC
  Administered 2021-01-28 – 2021-02-04 (×15): 50 mg via ORAL
  Filled 2021-01-28 (×8): qty 1
  Filled 2021-01-28: qty 2
  Filled 2021-01-28 (×6): qty 1

## 2021-01-28 MED ORDER — HYDRALAZINE HCL 20 MG/ML IJ SOLN
5.0000 mg | INTRAMUSCULAR | Status: DC | PRN
Start: 2021-01-28 — End: 2021-02-04
  Administered 2021-01-28 – 2021-01-29 (×2): 10 mg via INTRAVENOUS
  Filled 2021-01-28 (×2): qty 1

## 2021-01-28 MED ORDER — ASPIRIN EC 81 MG PO TBEC
81.0000 mg | DELAYED_RELEASE_TABLET | Freq: Every day | ORAL | Status: DC
  Administered 2021-01-28 – 2021-01-30 (×3): 81 mg via ORAL
  Filled 2021-01-28 (×3): qty 1

## 2021-01-28 MED ORDER — TIZANIDINE HCL 4 MG PO TABS
2.0000 mg | ORAL_TABLET | Freq: Three times a day (TID) | ORAL | Status: DC
  Administered 2021-01-28 – 2021-02-04 (×19): 2 mg via ORAL
  Filled 2021-01-28 (×20): qty 1

## 2021-01-28 MED ORDER — SODIUM CHLORIDE 0.9 % IV BOLUS
500.0000 mL | Freq: Once | INTRAVENOUS | Status: AC
  Administered 2021-01-28: 10:00:00 500 mL via INTRAVENOUS

## 2021-01-28 MED ORDER — CLOPIDOGREL BISULFATE 75 MG PO TABS
75.0000 mg | ORAL_TABLET | Freq: Every day | ORAL | Status: DC
  Administered 2021-01-29: 09:00:00 75 mg via ORAL
  Filled 2021-01-28: qty 1

## 2021-01-28 MED ORDER — IOHEXOL 350 MG/ML SOLN
50.0000 mL | Freq: Once | INTRAVENOUS | Status: AC | PRN
  Administered 2021-01-28: 11:00:00 50 mL via INTRAVENOUS

## 2021-01-28 MED ORDER — CARVEDILOL 12.5 MG PO TABS
12.5000 mg | ORAL_TABLET | Freq: Two times a day (BID) | ORAL | Status: DC
  Administered 2021-01-28 – 2021-02-04 (×13): 12.5 mg via ORAL
  Filled 2021-01-28 (×14): qty 1

## 2021-01-28 MED ORDER — ACETAMINOPHEN 650 MG RE SUPP: 650.0000 mg | RECTAL | Status: DC | PRN

## 2021-01-28 MED ORDER — INSULIN ASPART 100 UNIT/ML IJ SOLN
0.0000 [IU] | Freq: Three times a day (TID) | INTRAMUSCULAR | Status: DC
  Administered 2021-01-28 – 2021-01-29 (×2): 3 [IU] via SUBCUTANEOUS
  Administered 2021-01-31: 18:00:00 1 [IU] via SUBCUTANEOUS
  Administered 2021-01-31 (×2): 2 [IU] via SUBCUTANEOUS
  Administered 2021-02-01: 15:00:00 1 [IU] via SUBCUTANEOUS
  Administered 2021-02-01 – 2021-02-02 (×3): 2 [IU] via SUBCUTANEOUS
  Administered 2021-02-02 – 2021-02-03 (×2): 1 [IU] via SUBCUTANEOUS
  Administered 2021-02-03 – 2021-02-04 (×2): 2 [IU] via SUBCUTANEOUS

## 2021-01-28 NOTE — Consult Note (Signed)
Neurology Consult H&P  Bobby Branch MR# [id number] 01/28/2021   CC:   History is obtained from: patient, staff and chart.  HPI: Bobby Branch is a 60 y.o. male PMHx as reviewed below previous stroke with residual right sided deficits and bilateral carotid stenosis s/p right carotid stenting presented with right sided scalp tingling. He was initially supposed to have the left carotid artery stented however, could not due to issues with transportation.  The following information was taken from Dr. Shelby Mattocks admission note 01/28/2021:  "Bobby Branch is a 60 y.o. 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 He was watching basketball yesterday afternoon when he started to have this sensation. He said it felt like when he had his stroke so he had a friend bring him to the hospital last night. He states symptoms lasted for about one hour. His symptoms have since resolved. He had no other symptoms besides the tingling and denies any speech disturbance, weakness from baseline or confusion. He has not been taking his plavix and some other medication.    Smokes 1 PPD, hx of cocaine abuse but denies drug or alcohol use.    Denies any recent fever/chills, vision changes, chest pain or palpitations, shortness of breath or cough, stomach pain, N/V/D, dysuria/urgency or frequency, leg swelling. "   LKW: unclear tNK given: No not a candidate IR Thrombectomy No indication Modified Rankin Scale: 1-No significant post stroke disability and can perform usual duties with stroke symptoms NIHSS: 1  ROS: A complete ROS was performed and is negative except as noted in the HPI.  Past Medical History:  Diagnosis Date   Anxiety    Depression    Diabetes mellitus without complication (HCC)    type 2   Dyspnea     inhaler   GERD (gastroesophageal reflux disease)    History of kidney stones    HLD (hyperlipidemia)    Hypertension    Stroke ([place]) 09/2019   left arm and leg weak    History reviewed. No pertinent family history.  Social History:  reports that he has been smoking cigarettes. He has never used smokeless tobacco. He reports that he does not currently use alcohol. He reports current drug use. Drugs: Cocaine and Marijuana.   Prior to Admission medications   Medication Sig Start Date End Date Taking? Authorizing Provider  acetaminophen (TYLENOL) 325 MG tablet Take 2 tablets (650 mg total) by mouth every 4 (four) hours as needed for mild pain (or temp > 37.5 C (99.5 F)). 05/21/20  Yes Angiulli, Lavon Paganini, PA-C  albuterol (PROAIR HFA) 108 (90 Base) MCG/ACT inhaler Inhale 1 puff into the lungs every 6 (six) hours as needed for wheezing or shortness of breath. 01/10/21  Yes Charlott Rakes, MD  aspirin 325 MG EC tablet Take 325 mg by mouth daily.   Yes [provider]  atorvastatin (LIPITOR) 40 MG tablet Take 1 tablet (40 mg total) by mouth daily. 07/04/20  Yes Freeman Caldron M, PA-C  carvedilol (COREG) 12.5 MG tablet Take 1 tablet (12.5 mg total) by mouth 2 (two) times daily with a meal. 11/22/20  Yes Newlin, Enobong, MD  clopidogrel (PLAVIX) 75 MG tablet Take 1 tablet (75 mg total) by mouth daily. 07/04/20  Yes Freeman Caldron M, PA-C  diclofenac Sodium (VOLTAREN) 1 % GEL Apply 2  grams topically 4 (four) times daily. 10/25/20  Yes Carvel Getting, NP  DULoxetine (CYMBALTA) 60 MG capsule Take 1 capsule (60 mg total) by mouth daily. 11/22/20  Yes Charlott Rakes, MD  famotidine (PEPCID) 20 MG tablet Take 1 tablet (20 mg total) by mouth daily. 07/04/20  Yes Freeman Caldron M, PA-C  glimepiride (AMARYL) 2 MG tablet Take 1.5 tablets (3 mg total) by mouth daily with breakfast. 11/22/20  Yes Newlin, Enobong, MD  hydrALAZINE (APRESOLINE) 50 MG tablet Take 1 tablet (50 mg total) by mouth 3 (three) times  daily. Patient taking differently: Take 50 mg by mouth in the morning and at bedtime. 07/04/20  Yes McClung, Angela M, PA-C  losartan (COZAAR) 25 MG tablet Take 1 tab by mouth daily Patient taking differently: Take 25 mg by mouth in the morning and at bedtime. 01/02/21  Yes   Multiple Vitamin (MULTIVITAMIN WITH MINERALS) TABS tablet Take 1 tablet by mouth daily. 05/22/20  Yes Angiulli, Lavon Paganini, PA-C  tiZANidine (ZANAFLEX) 4 MG tablet TAKE 1 TABLET(2 MG) BY MOUTH THREE TIMES DAILY Patient taking differently: Take 4 mg by mouth 3 (three) times daily. 11/22/20  Yes Charlott Rakes, MD  aspirin 81 MG EC tablet Take 1 tablet (81 mg total) by mouth daily. Patient not taking: Reported on 01/28/2021 06/22/20   Ronney Lion, PA-C  docusate sodium (COLACE) 100 MG capsule Take 1 capsule (100 mg total) by mouth 2 (two) times daily. 05/21/20   Angiulli, Lavon Paganini, PA-C  gabapentin (NEURONTIN) 300 MG capsule Take 1 capsule (300 mg total) by mouth 2 (two) times daily. 10/29/20   Charlott Rakes, MD  lidocaine (LIDODERM) 5 % Place 1 patch onto the skin daily. Remove & Discard patch within 12 hours or as directed by MD Patient not taking: Reported on 01/28/2021 05/21/20   [place], Lavon Paganini, PA-C  Misc. Devices MISC L AFO brace. L leg weakness. 01/07/21   Charlott Rakes, MD  nicotine (NICODERM CQ - DOSED IN MG/24 HR) 7 mg/24hr patch Place 1 patch (7 mg total) onto the skin daily. Patient not taking: Reported on 01/28/2021 05/22/20   Angiulli, Lavon Paganini, PA-C  polyethylene glycol (MIRALAX / GLYCOLAX) 17 g packet Take 17 g by mouth daily. Patient not taking: Reported on 01/28/2021 05/22/20   Angiulli, Lavon Paganini, PA-C    Exam: Current vital signs: BP (!) 199/59    Pulse 79    Temp 97.8 F (36.6 C) (Oral)    Resp 15    Ht 5\' 5"  (1.651 m)    Wt 62.1 kg    SpO2 98%    BMI 22.80 kg/m   Physical Exam  Constitutional: Appears well-developed and well-nourished.  Psych: Affect appropriate to situation Eyes: No scleral  injection HENT: No OP obstruction. Head: Normocephalic.  Cardiovascular: Normal rate and regular rhythm.  Respiratory: Effort normal, symmetric excursions bilaterally, no audible wheezing. GI: Soft.  No distension. There is no tenderness.  Skin: WDI  Neuro: Mental Status: Patient is awake, alert, oriented to person, place, month, year, and situation. Patient is able to give a clear and coherent history. Speech is fluent, intact comprehension and repetition. No signs of aphasia or neglect. Visual Fields are full. Pupils are equal, round, and reactive to light. EOMI without ptosis or diploplia.  Facial sensation is symmetric to temperature Facial movement is symmetric.  Hearing is intact to voice. Uvula midline and palate elevates symmetrically. Shoulder shrug is symmetric. Tongue is midline without atrophy or fasciculations.  Tone is  increased in right. Strength is 4/5 right extremities. Sensation is symmetric to light touch and temperature in the arms and legs. Deep Tendon Reflexes: 2+ and symmetric in the biceps and patellae. Toes are up on right. FNF and HKS are intact bilaterally. Gait - Deferred  I have reviewed labs in epic and the pertinent results are: Cr 3.3  I have reviewed the images obtained: MRI brain showed no acute intracranial abnormality. Advanced chronic ischemic disease. Chronic cortical infarcts in the right MCA and ACA territory, bilateral small vessel disease, some brainstem Wallerian degeneration.  CTA head and neck showed right carotid artery stent beginning at the distal common carotid artery, extending through the ICA bulb into the proximal cervical ICA. There is filling defect within the stent that occupies approximately 50% of the diameter. Patent diameter measures 1.5 mm. Left carotid artery withy extensive soft and calcified plaque at the carotid bifurcation and ICA bulb with ulceration. Lumen of the proximal ICA is markedly narrowed, with serial  stenoses showing a diameter of 73mm.   Assessment: Bobby Branch is a 60 y.o. male PMHx previous stroke with residual right sided deficits and bilateral carotid stenosis s/p right carotid stenting presented with right sided scalp tingling. MRI was negative for acute findings.  He was initially supposed to have the left carotid artery stented however, could not due to issues with transportation. He is not on antiplatelets because he ran out.  Discussed the case with Dr. Estanislado Pandy and he would like to perform stenting some time tomorrow and he will have a more accurate time by tomorrow therefore, the will be admitted to hospitalist service today in preparation for procedure.  Plan: - Load with clopidogrel 300mg  now and continue 75mg  daily. - Aspirin 325mg  now and continue 81mg  daily. - IV access with gentle hydration. - NPO except for medications after midnight.  Electronically signed by:  Lynnae Sandhoff, MD Page: [phone] 01/28/2021, 1:52 PM

## 2021-01-28 NOTE — ED Provider Notes (Signed)
60 year old gentleman presenting to the ER with concern for episode of right scalp numbness and visual changes.  Symptoms ongoing for up to 6 hours, since resolved.  Currently no deficits right now.  Has residual left-sided deficits from prior stroke.  Patient reports that he had stent placed on right carotid artery and was supposed to have stent placed on left side but had issues with transportation and was unable to get this completed.  At time of signout, plan to follow-up on results of MRI imaging including MR angio head and neck.  Received signout from Dr. Wyvonnia Dusky  Patient was unable to tolerate getting all of the MRIs, discussed case with Dr. Theda Sers.  He recommends getting CT angio head and neck.  He will discuss results with neuro IR.   CT demonstrating severe left ICA stenosis though not significantly changed from March.  Reviewed with Dr. Theda Sers, he states that neuro IR will plan to do procedure tomorrow, requests Plavix load, followed by daily aspirin and Plavix, admission to medicine service, n.p.o. at midnight tonight   Lucrezia Starch, MD 01/28/21 1234

## 2021-01-28 NOTE — Assessment & Plan Note (Signed)
UDS pending, denies recent cocaine use

## 2021-01-28 NOTE — ED Notes (Signed)
Pt ambulated to bathroom independently

## 2021-01-28 NOTE — Assessment & Plan Note (Signed)
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 Had right carotid stent placed by neuro IR in 06/2020 with plans for stent placement of left carotid in November; however, he did not make this appointment.  -has not been complaint with his plavix or statin -starting back medication with plans for stenting of severe left carotid stenosis tomorrow -right stent with thrombus and stenosis, plans per IR.

## 2021-01-28 NOTE — Assessment & Plan Note (Addendum)
Hemiplegia of LUE At baseline, lives alone Bobby Branch broke and he needs this to get around, will consult PT and if per recommendations needs a walker can hopefully have this for him on discharge  -SW consult

## 2021-01-28 NOTE — Assessment & Plan Note (Signed)
Uncontrolled. Noncompliant with medication -continue his cozaar BID at 25mg  -continue hydralazine 50mg  BID -continue coreg 12.5mg  BID (has not been taking this). F/u on UDS as well

## 2021-01-28 NOTE — Assessment & Plan Note (Addendum)
Appears to be at his baseline (2.0-2.4) Continue to avoid nephrotoxic drugs and monitor function  Discussed medication compliance with his HTN

## 2021-01-28 NOTE — Assessment & Plan Note (Addendum)
60 year old with hx of bilateral carotid artery disease presenting with right sided scalp tingling similar to previous symptoms of left MCA stroke. Stroke ruled out, but has severe right sided carotid artery disease. -admit to progressive with frequent neuro checks  -neurology consulted for possible stroke which was ruled out, but based on severe left carotid disease spoke with neuro IR, Dr. Estanislado Pandy, who plans on stent tomorrow.  -NPO at midnight -loaded with plavix as he has not been taking this and will continue plavix/ASA -started back his statin, not been compliant with this as well.  -encouraged smoking cessation, nicotine patch

## 2021-01-28 NOTE — ED Provider Notes (Signed)
[facility] EMERGENCY DEPARTMENT Provider Note   CSN: [id number] Arrival date & time: 01/27/21  2049     History Chief Complaint  Patient presents with   Hypertension    Bobby Branch is a 60 y.o. male.  Patient with a history of hypertension, diabetes, previous stroke with left-sided deficits here with headache, numbness sensation to his right scalp as well as visual changes.  States while he was watching television last night about 10 PM he noticed there is numbness and tingling to his right forehead and side of his head.  He had mild headache at this time as well.  States while he was watching television his vision was "going in and out".  States it was blurry in both eyes and coming and going but is back to baseline now.  Symptoms were ongoing for approximately 6 hours and have since resolved.  He is left with left-sided deficits from his previous stroke which are unchanged and his vision is at his baseline. He was concerned he could be having a new stroke.  He has not had several medications include amlodipine and Plavix for about the past month. He has a right carotid stent and was told he was supposed to have 1 on the left side but did not have transportation to get this done.  Initial plan per Dr. Pasty Arch notes was for patient to have vascular surgery evaluation for CEA.  Patient however failed to get this done and was recommended for intravascular stenting of left carotid artery which was not done also.  While he was smoking a cigarette last night he had a brief episode of right-sided chest pain lasting for few minutes which is since resolved and not recurred.  The history is provided by the patient.  Hypertension Associated symptoms include headaches. Pertinent negatives include no chest pain, no abdominal pain and no shortness of breath.      Past Medical History:  Diagnosis Date   Anxiety    Depression    Diabetes mellitus without complication (HCC)     type 2   Dyspnea    inhaler   GERD (gastroesophageal reflux disease)    History of kidney stones    HLD (hyperlipidemia)    Hypertension    Stroke ([place]) 09/2019   left arm and leg weak    Patient Active Problem List   Diagnosis Date Noted   Internal carotid artery stenosis, bilateral 06/21/2020   Slow transit constipation    Spastic hemiplegia affecting nondominant side (HCC)    Stage 3b chronic kidney disease ([place])    Essential hypertension    Controlled type 2 diabetes mellitus with hyperglycemia, without long-term current use of insulin (HCC)    Protein-calorie malnutrition, severe 04/25/2020   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   Acute right MCA stroke ([place]) 04/20/2020    Past Surgical History:  Procedure Laterality Date   EYE SURGERY Left    IR ANGIO EXTRACRAN SEL COM CAROTID INNOMINATE UNI L MOD SED  04/20/2020   IR ANGIO INTRA EXTRACRAN SEL COM CAROTID INNOMINATE UNI L MOD SED  06/21/2020   IR ANGIO VERTEBRAL SEL SUBCLAVIAN INNOMINATE UNI R MOD SED  06/21/2020   IR CT HEAD LTD  04/20/2020   IR INTRAVSC STENT CERV CAROTID W/EMB-PROT MOD SED INCL ANGIO  06/21/2020   IR PERCUTANEOUS ART THROMBECTOMY/INFUSION INTRACRANIAL INC DIAG ANGIO  04/20/2020   IR RADIOLOGIST EVAL & MGMT  06/05/2020   IR RADIOLOGIST EVAL & MGMT  07/18/2020  IR RADIOLOGIST EVAL & MGMT  10/23/2020   IR US GUIDE VASC ACCESS RIGHT  04/20/2020   IR US GUIDE VASC ACCESS RIGHT  06/21/2020   RADIOLOGY WITH ANESTHESIA N/A 04/20/2020   Procedure: IR WITH ANESTHESIA;  Surgeon: Luanne Bras, MD;  Location: [facility];  Service: Radiology;  Laterality: N/A;   RADIOLOGY WITH ANESTHESIA N/A 06/21/2020   Procedure: RADIOLOGY WITH ANESTHESIA  STENTING;  Surgeon: Corrie Mckusick, DO;  Location: [place];  Service: Anesthesiology;  Laterality: N/A;       No family history on file.  Social History   Tobacco Use   Smoking status: Every Day    Types: Cigarettes   Smokeless tobacco: Never   Tobacco comments:     patient trying to quit  Vaping Use   Vaping Use: Never used  Substance Use Topics   Alcohol use: Not Currently    Comment: beer   Drug use: Yes    Types: Cocaine, Marijuana    Comment: as of 12/06/20 not using Cocaine    Home Medications Prior to Admission medications   Medication Sig Start Date End Date Taking? Authorizing Provider  acetaminophen (TYLENOL) 325 MG tablet Take 2 tablets (650 mg total) by mouth every 4 (four) hours as needed for mild pain (or temp > 37.5 C (99.5 F)). 05/21/20   Angiulli, Lavon Paganini, PA-C  albuterol (PROAIR HFA) 108 (90 Base) MCG/ACT inhaler Inhale 1 puff into the lungs every 6 (six) hours as needed for wheezing or shortness of breath. 01/10/21   Charlott Rakes, MD  aspirin 81 MG EC tablet Take 1 tablet (81 mg total) by mouth daily. 06/22/20   Louk, Bea Graff, PA-C  atorvastatin (LIPITOR) 40 MG tablet Take 1 tablet (40 mg total) by mouth daily. 07/04/20   Argentina Donovan, PA-C  carvedilol (COREG) 12.5 MG tablet Take 1 tablet (12.5 mg total) by mouth 2 (two) times daily with a meal. 11/22/20   Charlott Rakes, MD  clopidogrel (PLAVIX) 75 MG tablet Take 1 tablet (75 mg total) by mouth daily. 07/04/20   Argentina Donovan, PA-C  diclofenac Sodium (VOLTAREN) 1 % GEL Apply 2 grams topically 4 (four) times daily. 10/25/20   Carvel Getting, NP  docusate sodium (COLACE) 100 MG capsule Take 1 capsule (100 mg total) by mouth 2 (two) times daily. 05/21/20   Angiulli, Lavon Paganini, PA-C  DULoxetine (CYMBALTA) 60 MG capsule Take 1 capsule (60 mg total) by mouth daily. 11/22/20   Charlott Rakes, MD  famotidine (PEPCID) 20 MG tablet Take 1 tablet (20 mg total) by mouth daily. 07/04/20   Argentina Donovan, PA-C  gabapentin (NEURONTIN) 300 MG capsule Take 1 capsule (300 mg total) by mouth 2 (two) times daily. 10/29/20   Charlott Rakes, MD  glimepiride (AMARYL) 2 MG tablet Take 1.5 tablets (3 mg total) by mouth daily with breakfast. 11/22/20   Charlott Rakes, MD  hydrALAZINE  (APRESOLINE) 50 MG tablet Take 1 tablet (50 mg total) by mouth 3 (three) times daily. 07/04/20   Argentina Donovan, PA-C  ibuprofen (ADVIL) 800 MG tablet Take 800 mg by mouth every 8 (eight) hours as needed.    [provider]  lidocaine (LIDODERM) 5 % Place 1 patch onto the skin daily. Remove & Discard patch within 12 hours or as directed by MD 05/21/20   Angiulli, Lavon Paganini, PA-C  losartan (COZAAR) 25 MG tablet Take 1 tab by mouth daily 01/02/21     Misc. Devices MISC L AFO brace.  L leg weakness. 01/07/21   Charlott Rakes, MD  Multiple Vitamin (MULTIVITAMIN WITH MINERALS) TABS tablet Take 1 tablet by mouth daily. Patient taking differently: Take 1 tablet by mouth daily. Denies using this medication 05/22/20   Angiulli, Lavon Paganini, PA-C  nicotine (NICODERM CQ - DOSED IN MG/24 HR) 7 mg/24hr patch Place 1 patch (7 mg total) onto the skin daily. 05/22/20   Angiulli, Lavon Paganini, PA-C  polyethylene glycol (MIRALAX / GLYCOLAX) 17 g packet Take 17 g by mouth daily. 05/22/20   Angiulli, Lavon Paganini, PA-C  tiZANidine (ZANAFLEX) 4 MG tablet TAKE 1 TABLET(2 MG) BY MOUTH THREE TIMES DAILY 11/22/20   Charlott Rakes, MD    Allergies    Patient has no known allergies.  Review of Systems   Review of Systems  Constitutional:  Negative for activity change, appetite change, fatigue and fever.  HENT:  Negative for congestion and rhinorrhea.   Eyes:  Positive for visual disturbance.  Respiratory:  Negative for cough, chest tightness and shortness of breath.   Cardiovascular:  Negative for chest pain.  Gastrointestinal:  Negative for abdominal pain, nausea and vomiting.  Genitourinary:  Negative for dysuria and hematuria.  Musculoskeletal:  Negative for arthralgias and myalgias.  Skin:  Negative for rash.  Neurological:  Positive for numbness and headaches.   all other systems are negative except as noted in the HPI and PMH.   Physical Exam Updated Vital Signs BP (!) 166/89    Pulse 68    Temp 97.6 F (36.4  C)    Resp 16    Ht 5\' 5"  (1.651 m)    Wt 62.1 kg    SpO2 98%    BMI 22.80 kg/m   Physical Exam Vitals and nursing note reviewed.  Constitutional:      General: He is not in acute distress.    Appearance: He is well-developed.  HENT:     Head: Normocephalic and atraumatic.     Mouth/Throat:     Pharynx: No oropharyngeal exudate.  Eyes:     Conjunctiva/sclera: Conjunctivae normal.     Pupils: Pupils are equal, round, and reactive to light.  Neck:     Comments: No meningismus. Cardiovascular:     Rate and Rhythm: Normal rate and regular rhythm.     Heart sounds: Normal heart sounds. No murmur heard. Pulmonary:     Effort: Pulmonary effort is normal. No respiratory distress.     Breath sounds: Normal breath sounds.  Abdominal:     Palpations: Abdomen is soft.     Tenderness: There is no abdominal tenderness. There is no guarding or rebound.  Musculoskeletal:        General: No tenderness. Normal range of motion.     Cervical back: Normal range of motion and neck supple.  Skin:    General: Skin is warm.  Neurological:     Mental Status: He is alert and oriented to person, place, and time.     Cranial Nerves: No cranial nerve deficit.     Motor: No abnormal muscle tone.     Coordination: Coordination normal.     Comments: LUE and LLE weakness at baseline.  L facial droop.  Eyebrow raise symmetric. EOMI No sensory deficits to palpation of scalp.   Psychiatric:        Behavior: Behavior normal.    ED Results / Procedures / Treatments   Labs (all labs ordered are listed, but only abnormal results are displayed) Labs Reviewed  COMPREHENSIVE METABOLIC  PANEL - Abnormal; Notable for the following components:      Result Value   Creatinine, Ser 2.03 (*)    Albumin 3.3 (*)    GFR, Estimated 37 (*)    All other components within normal limits  CBC WITH DIFFERENTIAL/PLATELET - Abnormal; Notable for the following components:   RBC 4.15 (*)    All other components within  normal limits  URINALYSIS, ROUTINE W REFLEX MICROSCOPIC - Abnormal; Notable for the following components:   Hgb urine dipstick TRACE (*)    Protein, ur >300 (*)    All other components within normal limits  URINALYSIS, MICROSCOPIC (REFLEX) - Abnormal; Notable for the following components:   Bacteria, UA RARE (*)    All other components within normal limits  SEDIMENTATION RATE  TROPONIN I (HIGH SENSITIVITY)    EKG EKG Interpretation  Date/Time:  Monday January 28 2021 06:32:34 EST Ventricular Rate:  61 PR Interval:  156 QRS Duration: 75 QT Interval:  418 QTC Calculation: 421 R Axis:   35 Text Interpretation: Sinus rhythm Probable anteroseptal infarct, recent No significant change was found Confirmed by Ezequiel Essex [phone]) on 01/28/2021 6:35:00 AM  Radiology CT Head Wo Contrast  Result Date: 01/28/2021 CLINICAL DATA:  Hypertensive emergency EXAM: CT HEAD WITHOUT CONTRAST TECHNIQUE: Contiguous axial images were obtained from the base of the skull through the vertex without intravenous contrast. COMPARISON:  04/20/2020 FINDINGS: Brain: There is no mass, hemorrhage or extra-axial collection. There is generalized atrophy without lobar predilection. Hypodensity of the white matter is most commonly associated with chronic microvascular disease. Old right frontal and parietal infarcts. Old small vessel infarct along the posterior limb of the right internal capsule. Vascular: No abnormal hyperdensity of the major intracranial arteries or dural venous sinuses. No intracranial atherosclerosis. Skull: The visualized skull base, calvarium and extracranial soft tissues are normal. Sinuses/Orbits: No fluid levels or advanced mucosal thickening of the visualized paranasal sinuses. No mastoid or middle ear effusion. The orbits are normal. IMPRESSION: 1. No acute intracranial abnormality. 2. Old right frontal and parietal infarcts and findings of chronic microvascular ischemia. Electronically Signed    By: Ulyses Jarred M.D.   On: 01/28/2021 00:20    Procedures Procedures   Medications Ordered in ED Medications - No data to display  ED Course  I have reviewed the triage vital signs and the nursing notes.  Pertinent labs & imaging results that were available during my care of the patient were reviewed by me and considered in my medical decision making (see chart for details).    MDM Rules/Calculators/A&P                         Episode of right-sided scalp tingling with change in vision and a headache.  Now back to baseline.  Code stroke not activated due to resolution of symptoms.  Chest pain description is atypical for ACS but will obtain EKG and troponin.  Patient with noncompliance and multiple missed medications his CT scan does not show any acute pathology.  Chest pain has resolved and not consistent with ACS.  EKG is sinus rhythm.  Troponin is negative.  Creatinine is at baseline.  Given patient's known previous stroke as well as significant intra and extracrainal vascular pathology will obtain MRI to rule out new infarct as well as establish stability of his known left ICA occlusion which was not intervened upon.  May need neurology evaluation upon completion.  Do suspect likely TIA but will  rule out new stroke.  Dr. Reubin Milan to assume care at shift change.     Final Clinical Impression(s) / ED Diagnoses Final diagnoses:  None    Rx / DC Orders ED Discharge Orders     None        Dior Dominik, Annie Main, MD 01/28/21 [phone]

## 2021-01-28 NOTE — ED Notes (Signed)
Patient transported to CT 

## 2021-01-28 NOTE — Assessment & Plan Note (Signed)
Continue cymbalta  

## 2021-01-28 NOTE — H&P (Signed)
History and Physical    Bobby Branch GXQ:[id number] DOB: 1960-11-30 DOA: 01/27/2021  PCP: Charlott Rakes, MD Consultants:  nephrology: unsure of who he sees Patient coming from:  Home - lives alone   Chief Complaint: right facial numbness/tingling   HPI: Bobby Branch is a 60 y.o. 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 He was watching basketball yesterday afternoon when he started to have this sensation. He said it felt like when he had his stroke so he had a friend bring him to the hospital last night. He states symptoms lasted for about one hour. His symptoms have since resolved. He had no other symptoms besides the tingling and denies any speech disturbance, weakness from baseline or confusion. He has not been taking his plavix, statin and some of his blood pressure medication.   His right MCA in march of this y ear was secondary to critical stenosis of the right ICA +embolism. Unable to undergo procedure at time of this hospitalization so stent was placed by neuro IR in 06/2020 with plans to stent his left carotid in 12/2020; however, due to transportation and other issues he did not get this done.   Smokes 1 PPD, hx of cocaine abuse but denies drug or alcohol use.   Denies any recent fever/chills, vision changes, palpitations, shortness of breath or cough, stomach pain, N/V/D, dysuria/urgency or frequency, leg swelling. Had a short episode or right anterior chest pain yesterday that was fleeting and has had no other chest pain since that time.   ED Course: vitals: afebrile, bp: 190/103, HR: 78, RR: 16, oxygen: 100% room air Pertinent labs: creatinine: 2.03 (2.1-2.4), ESR: 50 CXR: no acute finding, CT head: no acute finding MRI brain: no acute  abnormality, chronic cortical infarcts in right MCA and ACA territory. Some brainstem wallerian degeneration CT angio head/neck: severe stenosis of left ICA.  Right carotid artery stenting. In bulb intra stent thrombus occupying 50% of lumen.  In ED: given ASA/plavix and neurology was consulted. TRH was asked to admit.   Review of Systems: As per HPI; otherwise review of systems reviewed and negative.   Ambulatory Status:  Ambulates with walker, but this has broken.     Past Medical History:  Diagnosis Date   Anxiety    Depression    Diabetes mellitus without complication (HCC)    type 2   Dyspnea    inhaler   GERD (gastroesophageal reflux disease)    History of kidney stones    HLD (hyperlipidemia)    Hypertension    Stroke ([place]) 09/2019   left arm and leg weak    Past Surgical History:  Procedure Laterality Date   EYE SURGERY Left    IR ANGIO EXTRACRAN SEL COM CAROTID INNOMINATE UNI L MOD SED  04/20/2020   IR ANGIO INTRA EXTRACRAN SEL COM CAROTID INNOMINATE UNI L MOD SED  06/21/2020   IR ANGIO VERTEBRAL SEL SUBCLAVIAN INNOMINATE UNI R MOD SED  06/21/2020   IR CT HEAD LTD  04/20/2020   IR INTRAVSC STENT CERV CAROTID W/EMB-PROT MOD SED INCL ANGIO  06/21/2020   IR PERCUTANEOUS ART THROMBECTOMY/INFUSION INTRACRANIAL INC DIAG ANGIO  04/20/2020   IR RADIOLOGIST EVAL & MGMT  06/05/2020   IR RADIOLOGIST EVAL & MGMT  07/18/2020   IR RADIOLOGIST EVAL &  MGMT  10/23/2020   IR US GUIDE VASC ACCESS RIGHT  04/20/2020   IR US GUIDE VASC ACCESS RIGHT  06/21/2020   RADIOLOGY WITH ANESTHESIA N/A 04/20/2020   Procedure: IR WITH ANESTHESIA;  Surgeon: Luanne Bras, MD;  Location: [place];  Service: Radiology;  Laterality: N/A;   RADIOLOGY WITH ANESTHESIA N/A 06/21/2020   Procedure: RADIOLOGY WITH ANESTHESIA  STENTING;  Surgeon: Corrie Mckusick, DO;  Location: [facility];  Service: Anesthesiology;  Laterality: N/A;    Social History   Socioeconomic History   Marital status: Legally Separated    Spouse  name: Not on file   Number of children: Not on file   Years of education: Not on file   Highest education level: Not on file  Occupational History   Not on file  Tobacco Use   Smoking status: Every Day    Types: Cigarettes   Smokeless tobacco: Never   Tobacco comments:    patient trying to quit  Vaping Use   Vaping Use: Never used  Substance and Sexual Activity   Alcohol use: Not Currently    Comment: beer   Drug use: Yes    Types: Cocaine, Marijuana    Comment: as of 12/06/20 not using Cocaine   Sexual activity: Yes    Partners: Female  Other Topics Concern   Not on file  Social History Narrative   Not on file   Social Determinants of Health   Financial Resource Strain: Not on file  Food Insecurity: Not on file  Transportation Needs: Not on file  Physical Activity: Not on file  Stress: Not on file  Social Connections: Not on file  Intimate Partner Violence: Not on file    No Known Allergies  History reviewed. No pertinent family history.  Prior to Admission medications   Medication Sig Start Date End Date Taking? Authorizing Provider  acetaminophen (TYLENOL) 325 MG tablet Take 2 tablets (650 mg total) by mouth every 4 (four) hours as needed for mild pain (or temp > 37.5 C (99.5 F)). 05/21/20  Yes Angiulli, Lavon Paganini, PA-C  albuterol (PROAIR HFA) 108 (90 Base) MCG/ACT inhaler Inhale 1 puff into the lungs every 6 (six) hours as needed for wheezing or shortness of breath. 01/10/21  Yes Charlott Rakes, MD  aspirin 325 MG EC tablet Take 325 mg by mouth daily.   Yes [provider]  atorvastatin (LIPITOR) 40 MG tablet Take 1 tablet (40 mg total) by mouth daily. 07/04/20  Yes Freeman Caldron M, PA-C  carvedilol (COREG) 12.5 MG tablet Take 1 tablet (12.5 mg total) by mouth 2 (two) times daily with a meal. 11/22/20  Yes Newlin, Enobong, MD  clopidogrel (PLAVIX) 75 MG tablet Take 1 tablet (75 mg total) by mouth daily. 07/04/20  Yes Freeman Caldron M, PA-C  diclofenac Sodium  (VOLTAREN) 1 % GEL Apply 2 grams topically 4 (four) times daily. 10/25/20  Yes Carvel Getting, NP  DULoxetine (CYMBALTA) 60 MG capsule Take 1 capsule (60 mg total) by mouth daily. 11/22/20  Yes Charlott Rakes, MD  famotidine (PEPCID) 20 MG tablet Take 1 tablet (20 mg total) by mouth daily. 07/04/20  Yes Freeman Caldron M, PA-C  glimepiride (AMARYL) 2 MG tablet Take 1.5 tablets (3 mg total) by mouth daily with breakfast. 11/22/20  Yes Newlin, Enobong, MD  hydrALAZINE (APRESOLINE) 50 MG tablet Take 1 tablet (50 mg total) by mouth 3 (three) times daily. Patient taking differently: Take 50 mg by mouth in the morning and at bedtime.  07/04/20  Yes McClung, Angela M, PA-C  losartan (COZAAR) 25 MG tablet Take 1 tab by mouth daily Patient taking differently: Take 25 mg by mouth in the morning and at bedtime. 01/02/21  Yes   Multiple Vitamin (MULTIVITAMIN WITH MINERALS) TABS tablet Take 1 tablet by mouth daily. 05/22/20  Yes Angiulli, Lavon Paganini, PA-C  tiZANidine (ZANAFLEX) 4 MG tablet TAKE 1 TABLET(2 MG) BY MOUTH THREE TIMES DAILY Patient taking differently: Take 4 mg by mouth 3 (three) times daily. 11/22/20  Yes Charlott Rakes, MD  aspirin 81 MG EC tablet Take 1 tablet (81 mg total) by mouth daily. Patient not taking: Reported on 01/28/2021 06/22/20   Ronney Lion, PA-C  docusate sodium (COLACE) 100 MG capsule Take 1 capsule (100 mg total) by mouth 2 (two) times daily. 05/21/20   Angiulli, Lavon Paganini, PA-C  gabapentin (NEURONTIN) 300 MG capsule Take 1 capsule (300 mg total) by mouth 2 (two) times daily. 10/29/20   Charlott Rakes, MD  lidocaine (LIDODERM) 5 % Place 1 patch onto the skin daily. Remove & Discard patch within 12 hours or as directed by MD Patient not taking: Reported on 01/28/2021 05/21/20   [place], Lavon Paganini, PA-C  Misc. Devices MISC L AFO brace. L leg weakness. 01/07/21   Charlott Rakes, MD  nicotine (NICODERM CQ - DOSED IN MG/24 HR) 7 mg/24hr patch Place 1 patch (7 mg total) onto the skin  daily. Patient not taking: Reported on 01/28/2021 05/22/20   Angiulli, Lavon Paganini, PA-C  polyethylene glycol (MIRALAX / GLYCOLAX) 17 g packet Take 17 g by mouth daily. Patient not taking: Reported on 01/28/2021 05/22/20   Cathlyn Parsons, PA-C    Physical Exam: Vitals:   01/28/21 1134 01/28/21 1200 01/28/21 1230 01/28/21 1300  BP: (!) 185/99 (!) 181/104 (!) 177/96 (!) 199/59  Pulse: 68 72 67 79  Resp:   15   Temp:      TempSrc:      SpO2: 100% 99% 97% 98%  Weight:      Height:         General:  Appears calm and comfortable and is in NAD Eyes:  PERRL, EOMI, normal lids, iris ENT:  grossly normal hearing, lips & tongue, mmm; appropriate dentition Neck:  no LAD, masses or thyromegaly; quiet left carotid bruit Cardiovascular:  RRR, no m/r/g. No LE edema.  Respiratory:   CTA bilaterally with no wheezes/rales/rhonchi.  Normal respiratory effort. Abdomen:  soft, NT, ND, NABS Back:   normal alignment, no CVAT Skin:  no rash or induration seen on limited exam Musculoskeletal:  LUE 05, LLE: 4/5 (baseline), Right upper and lower extremity: 5/5.  good ROM of right extremities., no bony abnormality Lower extremity:  No LE edema.  Limited foot exam with no ulcerations.  2+ distal pulses. Psychiatric:  grossly normal mood and affect, speech fluent and appropriate, AOx3 Neurologic:  CN 2-12 grossly intact, VII-mild left facial droop at baseline moves all extremities in coordinated fashion except LUE, sensation intact, gait deferred     Radiological Exams on Admission: Independently reviewed - see discussion in A/P where applicable  CT ANGIO HEAD NECK W WO CM  Result Date: 01/28/2021 CLINICAL DATA:  Neuro deficit, acute, stroke suspected. MRI negative for acute insult. EXAM: CT ANGIOGRAPHY HEAD AND NECK TECHNIQUE: Multidetector CT imaging of the head and neck was performed using the standard protocol during bolus administration of intravenous contrast. Multiplanar CT image reconstructions and  MIPs were obtained to evaluate the vascular anatomy. Carotid  stenosis measurements (when applicable) are obtained utilizing NASCET criteria, using the distal internal carotid diameter as the denominator. CONTRAST:  61m OMNIPAQUE IOHEXOL 350 MG/ML SOLN COMPARISON:  MRI same day.  Head CT same day. FINDINGS: CTA NECK FINDINGS Aortic arch: Aortic atherosclerosis. No aneurysm or dissection. Branching pattern is normal without origin stenosis. Right carotid system: Common carotid artery widely patent to the distal extent. Carotid artery stent beginning at the distal common carotid artery, extending through the ICA bulb into the proximal cervical ICA. There is filling defect within the stent that occupies approximately 50% of the diameter. Patent diameter measures 1.5 mm. Beyond the stent, the cervical ICA is widely patent to the skull base. Left carotid system: Common carotid artery widely patent to the bifurcation. Extensive soft and calcified plaque at the carotid bifurcation and ICA bulb with ulceration. Lumen of the proximal ICA is markedly narrowed, with serial stenoses showing a diameter of 1 mm. Beyond that, the ICA shows a flow reduced diameter but is patent to the skull base. Vertebral arteries: Both vertebral artery origins are widely patent. Both vertebral arteries are patent through the cervical region to the foramen magnum, the left being larger than the right. Skeleton: Ordinary cervical spondylosis. Other neck: No mass or lymphadenopathy. Upper chest: Lung apices are clear. Review of the MIP images confirms the above findings CTA HEAD FINDINGS Anterior circulation: Both internal carotid arteries are patent through the skull base and siphon regions. There is ordinary siphon atherosclerotic calcification but no stenosis. The anterior and middle cerebral vessels are patent. No large vessel occlusion or correctable proximal stenosis. No aneurysm or vascular malformation. Posterior circulation: Both vertebral  arteries are patent through the foramen magnum to the basilar. No basilar stenosis. Posterior circulation branch vessels are patent. There are bilateral patent posterior communicating arteries. Venous sinuses: Patent and normal. Anatomic variants: None significant. Review of the MIP images confirms the above findings IMPRESSION: Aortic atherosclerosis. Carotid artery stent on the right extending from the distal common carotid artery over a length of 4 cm to the cervical ICA beyond the bulb. In the bulb region, there is apparent intra stent thrombus occupying 50% of the lumen. Patent lumen diameter is 1.5 mm. Compared to a more distal cervical ICA diameter of 5 mm, this indicates a 70% stenosis within the stent. Complex soft and calcified plaque at the left carotid bifurcation and ICA bulb. Areas of ulceration. Serial severe stenoses in the ICA bulb with diameter 1 mm or less. Flow reduced diameter of the more distal cervical ICA. Considerable risk of occlusion or embolic event from this disease. This disease appears quite similar to the previous CT angiogram from 04/20/2020. No intracranial large vessel occlusion or correctable proximal stenosis. Electronically Signed   By: MNelson ChimesM.D.   On: 01/28/2021 11:28   DG Chest 2 View  Result Date: 01/28/2021 CLINICAL DATA:  Chest pain EXAM: CHEST - 2 VIEW COMPARISON:  05/01/2020 FINDINGS: Lungs are clear.  No pleural effusion or pneumothorax. The heart is normal in size.  Thoracic aortic atherosclerosis. Visualized osseous structures are within normal limits. IMPRESSION: Normal chest radiographs. Electronically Signed   By: SJulian HyM.D.   On: 01/28/2021 07:06   CT Head Wo Contrast  Result Date: 01/28/2021 CLINICAL DATA:  Hypertensive emergency EXAM: CT HEAD WITHOUT CONTRAST TECHNIQUE: Contiguous axial images were obtained from the base of the skull through the vertex without intravenous contrast. COMPARISON:  04/20/2020 FINDINGS: Brain: There is no  mass, hemorrhage or extra-axial collection.  There is generalized atrophy without lobar predilection. Hypodensity of the white matter is most commonly associated with chronic microvascular disease. Old right frontal and parietal infarcts. Old small vessel infarct along the posterior limb of the right internal capsule. Vascular: No abnormal hyperdensity of the major intracranial arteries or dural venous sinuses. No intracranial atherosclerosis. Skull: The visualized skull base, calvarium and extracranial soft tissues are normal. Sinuses/Orbits: No fluid levels or advanced mucosal thickening of the visualized paranasal sinuses. No mastoid or middle ear effusion. The orbits are normal. IMPRESSION: 1. No acute intracranial abnormality. 2. Old right frontal and parietal infarcts and findings of chronic microvascular ischemia. Electronically Signed   By: Ulyses Jarred M.D.   On: 01/28/2021 00:20   MR ANGIO HEAD WO CONTRAST  Result Date: 01/28/2021 CLINICAL DATA:  60 year old male hypertensive emergency. Stroke-like symptoms. EXAM: MRA HEAD WITHOUT CONTRAST TECHNIQUE: Angiographic images of the Circle of Willis were acquired using MRA technique without intravenous contrast. COMPARISON:  Brain MRI today.  CTA head and neck 04/20/2020. FINDINGS: Degraded by motion artifact. Posterior circulation: Antegrade flow in the posterior circulation. Distal vertebral arteries appears stable since March, the left is mildly dominant. Motion artifact affecting the basilar artery which remains patent. No definite vertebrobasilar stenosis. PCA origins and small posterior communicating arteries are patent. PCA branch detail is degraded. Anterior circulation: Antegrade flow in both ICA siphons which appear to remain patent to the carotid termini. ICA siphon detail however is degraded. MCA and ACA origins are patent. M1 and A1 segments are patent. But other MCA and ACA branch detail is degraded. Anatomic variants: Dominant left vertebral  artery. Other: None. IMPRESSION: 1. Motion degraded intracranial MRA. 2. Reperfused Right ICA siphon since March CTA. No large vessel occlusion today. But no detail of the 2nd order or more distal circle-of-Willis branches. Electronically Signed   By: Genevie Ann M.D.   On: 01/28/2021 08:48   MR BRAIN WO CONTRAST  Result Date: 01/28/2021 CLINICAL DATA:  60 year old male hypertensive emergency. Stroke-like symptoms. EXAM: MRI HEAD WITHOUT CONTRAST TECHNIQUE: Multiplanar, multiecho pulse sequences of the brain and surrounding structures were obtained without intravenous contrast. COMPARISON:  Head CT 01/28/2021.  Brain MRI 04/21/2020. FINDINGS: Study is intermittently degraded by motion artifact despite repeated imaging attempts. Brain: No convincing restricted diffusion to suggest acute infarction. Mild susceptibility artifact on DWI in the right ACA territory appears related to chronic encephalomalacia with hemosiderin. Superimposed chronic right MCA territory encephalomalacia and gliosis, especially involving the right perirolandic cortex and operculum. Superimposed chronic lacunar infarcts in the bilateral basal ganglia and pons. Superimposed brainstem Wallerian degeneration greater on the right. Several small chronic cerebellar infarcts. No midline shift, mass effect, evidence of mass lesion, ventriculomegaly, extra-axial collection or acute intracranial hemorrhage. Cervicomedullary junction and pituitary are within normal limits. Vascular: Major intracranial vascular flow voids are preserved, stable. Some generalized intracranial artery tortuosity. See MRA today reported separately. Skull and upper cervical spine: Grossly negative cervical spine, normal visible bone marrow signal. Sinuses/Orbits: Stable and negative. Other: Mastoids remain well aerated. Negative visible scalp and face. IMPRESSION: 1. No acute intracranial abnormality. 2. Advanced chronic ischemic disease. Chronic cortical infarcts in the right  MCA and ACA territory, bilateral small vessel disease, some brainstem Wallerian degeneration. Electronically Signed   By: Genevie Ann M.D.   On: 01/28/2021 08:43    EKG: Independently reviewed.  NSR with rate 61; nonspecific ST changes with no evidence of acute ischemia   Labs on Admission: I have personally reviewed the available labs and  imaging studies at the time of the admission.  Pertinent labs:  creatinine: 2.03 (2.1-2.4),  ESR: 50  Assessment/Plan Carotid stenosis, symptomatic w/o infarct, left 60 year old with hx of bilateral carotid artery disease presenting with right sided scalp tingling similar to previous symptoms of left MCA stroke. Stroke ruled out, but has severe right sided carotid artery disease. -admit to progressive with frequent neuro checks  -neurology consulted for possible stroke which was ruled out, but based on severe left carotid disease spoke with neuro IR, Dr. Estanislado Pandy, who plans on stent tomorrow.  -NPO at midnight -loaded with plavix as he has not been taking this and will continue plavix/ASA -started back his statin, not been compliant with this as well.  -encouraged smoking cessation, nicotine patch   Internal carotid artery stenosis, bilateral Hx of right MCA secondary to critical stenosis of the right ICA + embolism. Had right carotid stent placed by neuro IR in 06/2020 with plans for stent placement of left carotid in November; however, he did not make this appointment.  -has not been complaint with his plavix or statin -starting back medication with plans for stenting of severe left carotid stenosis tomorrow -right stent with thrombus and stenosis, plans per IR.   Essential hypertension Uncontrolled. Noncompliant with medication -continue his cozaar BID at 10m -continue hydralazine 574mBID -continue coreg 12.36m48mID (has not been taking this). F/u on UDS as well   Spastic hemiplegia affecting nondominant side (HCC) Hemiplegia of LUE secondary to  right MCA stroke in 04/2020 s/p TPA and thrombectomy At baseline, [place] and he needs this to get around, will consult PT and if per recommendations needs a walker can hopefully have this for him on discharge  -SW consult   Controlled type 2 diabetes mellitus with hyperglycemia, without long-term current use of insulin (HCC) A1c of 6.7 in 11/2020 Hold amaryl and will do sensitive SSI and accuchecks  Stage 3b chronic kidney disease ([place]CCHoliday Lakesppears to be at his baseline (2.0-2.4) Continue to avoid nephrotoxic drugs and monitor function  Discussed medication compliance with his HTN  -intake/output   Depression Continue cymbalta   Polysubstance abuse (HCCNewfield HamletDS pending, denies recent cocaine use   Body mass index is 22.8 kg/m.   Level of care: Progressive DVT prophylaxis:  SCDs Code Status:  Full - confirmed with patient Family Communication: None present  Disposition Plan:  The patient is from: home  Anticipated d/c is to: per day team    Requires inpatient hospitalization > 2 midnights and is at significant risk of neurological worsening, requires constant monitoring, assessment,intervention and MDM with specialists.    Patient is currently: acutely ill Consults called: neurology/neuro IR/PT and sW  Admission status:  inpatient    AllOrma Flaming [facility]   How to contact the [facility]ending or Consulting provider 7A [place] covering provider during after hours 7P [place] this patient?  Check the care team in [facility] look for a) attending/consulting [facility] provider listed and b) the [facility] Centeram listed Log into [email or web address] and use [place]'s universal password to access. If you do not have the password, please contact the hospital operator. Locate the [facility] LLCovider you are looking for under [facility] and page to a number that you can be directly reached. If you still have difficulty reaching the provider, please page the [facility] Districtirector on  Call) for the Hospitalists listed on amion for assistance.   01/28/2021, 2:08 PM

## 2021-01-28 NOTE — Assessment & Plan Note (Signed)
A1c of 6.7 in 11/2020 Hold amaryl and will do sensitive SSI and accuchecks

## 2021-01-28 NOTE — ED Notes (Signed)
Patient transported to MRI 

## 2021-01-29 ENCOUNTER — Other Ambulatory Visit (HOSPITAL_COMMUNITY): Payer: Self-pay | Admitting: Physician Assistant

## 2021-01-29 ENCOUNTER — Inpatient Hospital Stay (HOSPITAL_COMMUNITY): Payer: Federal, State, Local not specified - PPO

## 2021-01-29 ENCOUNTER — Other Ambulatory Visit: Payer: Self-pay | Admitting: Radiology

## 2021-01-29 DIAGNOSIS — Z8673 Personal history of transient ischemic attack (TIA), and cerebral infarction without residual deficits: Secondary | ICD-10-CM

## 2021-01-29 DIAGNOSIS — I771 Stricture of artery: Secondary | ICD-10-CM

## 2021-01-29 DIAGNOSIS — I6522 Occlusion and stenosis of left carotid artery: Secondary | ICD-10-CM

## 2021-01-29 DIAGNOSIS — I10 Essential (primary) hypertension: Secondary | ICD-10-CM | POA: Diagnosis not present

## 2021-01-29 LAB — GLUCOSE, CAPILLARY
Glucose-Capillary: 101 mg/dL — ABNORMAL HIGH (ref 70–99)
Glucose-Capillary: 108 mg/dL — ABNORMAL HIGH (ref 70–99)
Glucose-Capillary: 132 mg/dL — ABNORMAL HIGH (ref 70–99)
Glucose-Capillary: 235 mg/dL — ABNORMAL HIGH (ref 70–99)
Glucose-Capillary: 217 mg/dL — ABNORMAL HIGH (ref 70–99)

## 2021-01-29 LAB — ECHOCARDIOGRAM COMPLETE
AR max vel: 2.95 cm2
AV Area VTI: 2.81 cm2
AV Area mean vel: 2.57 cm2
AV Mean grad: 3 mmHg
AV Peak grad: 5.4 mmHg
Ao pk vel: 1.16 m/s
Area-P 1/2: 5.09 cm2
Height: 65 in
S' Lateral: 3 cm
Weight: 2192 oz

## 2021-01-29 LAB — RAPID URINE DRUG SCREEN, HOSP PERFORMED
Amphetamines: NOT DETECTED
Barbiturates: NOT DETECTED
Benzodiazepines: NOT DETECTED
Cocaine: NOT DETECTED
Opiates: NOT DETECTED
Tetrahydrocannabinol: NOT DETECTED

## 2021-01-29 LAB — LIPID PANEL
Cholesterol: 197 mg/dL (ref 0–200)
HDL: 45 mg/dL (ref >40)
LDL Cholesterol: 114 mg/dL — ABNORMAL HIGH (ref 0–99)
Total CHOL/HDL Ratio: 4.4 RATIO
Triglycerides: 191 mg/dL — ABNORMAL HIGH (ref <150)
VLDL: 38 mg/dL (ref 0–40)

## 2021-01-29 LAB — HEMOGLOBIN A1C
Hgb A1c MFr Bld: 6.5 % — ABNORMAL HIGH (ref 4.8–5.6)
Mean Plasma Glucose: 139.85 mg/dL

## 2021-01-29 LAB — SURGICAL PCR SCREEN
MRSA, PCR: NEGATIVE
Staphylococcus aureus: POSITIVE — AB

## 2021-01-29 MED ORDER — TICAGRELOR 90 MG PO TABS
180.0000 mg | ORAL_TABLET | Freq: Once | ORAL | Status: AC
  Administered 2021-01-29: 21:00:00 180 mg via ORAL
  Filled 2021-01-29: qty 2

## 2021-01-29 MED ORDER — ENOXAPARIN SODIUM 30 MG/0.3ML IJ SOSY
30.0000 mg | PREFILLED_SYRINGE | INTRAMUSCULAR | Status: DC
  Administered 2021-01-29 – 2021-02-03 (×5): 30 mg via SUBCUTANEOUS
  Filled 2021-01-29 (×5): qty 0.3

## 2021-01-29 MED ORDER — TICAGRELOR 90 MG PO TABS
90.0000 mg | ORAL_TABLET | Freq: Two times a day (BID) | ORAL | Status: DC
  Administered 2021-01-30: 08:00:00 90 mg via ORAL
  Filled 2021-01-29 (×2): qty 1

## 2021-01-29 MED ORDER — CEFAZOLIN SODIUM-DEXTROSE 2-4 GM/100ML-% IV SOLN
2.0000 g | Freq: Once | INTRAVENOUS | Status: DC
  Filled 2021-01-29: qty 100

## 2021-01-29 MED ORDER — MUPIROCIN 2 % EX OINT
1.0000 "application " | TOPICAL_OINTMENT | Freq: Two times a day (BID) | CUTANEOUS | Status: AC
  Administered 2021-01-29 – 2021-02-02 (×10): 1 via NASAL
  Filled 2021-01-29 (×4): qty 22

## 2021-01-29 MED ORDER — ATORVASTATIN CALCIUM 80 MG PO TABS
80.0000 mg | ORAL_TABLET | Freq: Every day | ORAL | Status: DC
  Administered 2021-01-30 – 2021-02-04 (×6): 80 mg via ORAL
  Filled 2021-01-29 (×6): qty 1

## 2021-01-29 MED ORDER — POLYETHYLENE GLYCOL 3350 17 G PO PACK
17.0000 g | PACK | Freq: Every day | ORAL | Status: DC | PRN
  Administered 2021-01-29 – 2021-02-01 (×2): 17 g via ORAL
  Filled 2021-01-29 (×2): qty 1

## 2021-01-29 NOTE — Progress Notes (Signed)
Inpatient Diabetes Program Recommendations  AACE/ADA: New Consensus Statement on Inpatient Glycemic Control (2015)  Target Ranges:  Prepandial:   less than 140 mg/dL      Peak postprandial:   less than 180 mg/dL (1-2 hours)      Critically ill patients:  140 - 180 mg/dL   Lab Results  Component Value Date   GLUCAP 132 (H) 01/29/2021   HGBA1C 6.5 (H) 01/29/2021    Review of Glycemic Control  Latest Reference Range & Units 01/28/21 16:58 01/28/21 21:01 01/28/21 22:07 01/29/21 08:49 01/29/21 11:49 01/29/21 13:04  Glucose-Capillary 70 - 99 mg/dL 235 (H)  Novolog 3 units 55 (L) 139 (H) 101 (H) 108 (H) 132 (H)  (H): Data is abnormally high (L): Data is abnormally low  Diabetes history: DM2 Outpatient Diabetes medications: Amaryl 3 mg QAM Current orders for Inpatient glycemic control: Novolog 0-9 units TID  Inpatient Diabetes Program Recommendations:    CBG dropped to 55 mg/dl after receiving 3 units of Novolog correction.  Please consider:  Novolog 0-6 units TID  Will continue to follow while inpatient.  Thank you, Reche Dixon, RN, BSN Diabetes Coordinator Inpatient Diabetes Program [phone] (team pager from 8a-5p)
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
## 2021-01-29 NOTE — Evaluation (Signed)
Physical Therapy Evaluation Patient Details Name: Bobby Branch MRN: [id number] DOB: 05-15-1960 Today's Date: 01/29/2021  History of Present Illness  60 yo male presents to [facility] on 12/25 with HTN, R-sided scalp numbness, headache. CTA demonstrates severe L ICA stenosis. 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  Clinical Impression   Pt presents with chronic L hemiparesis from CVA in March 2022, impaired balance with recent history of falls, decreased activity tolerance vs baseline, and impaired gait. Pt to benefit from acute PT to address deficits. Pt ambulated hallway distance with and without rollator, rollator beneficial for increasing pt's confidence and independence. PT recommending [place] services including aide if able, has intermittent support of family and friends but lives alone. PT to progress mobility as tolerated, and will continue to follow acutely.         Recommendations for follow up therapy are one component of a multi-disciplinary discharge planning process, led by the attending physician.  Recommendations may be updated based on patient status, additional functional criteria and insurance authorization.  Follow Up Recommendations Home health PT    Assistance Recommended at Discharge Set up Supervision/Assistance  Functional Status Assessment Patient has had a recent decline in their functional status and demonstrates the ability to make significant improvements in function in a reasonable and predictable amount of time.  Equipment Recommendations  Rollator (4 wheels)    Recommendations for Other Services       Precautions / Restrictions Precautions Precautions: Fall (moderate) Restrictions Weight Bearing Restrictions: No      Mobility  Bed Mobility Overal bed mobility: Needs Assistance Bed Mobility: Supine to Sit     Supine to sit: Supervision     General bed mobility comments: for  safety, increased time    Transfers Overall transfer level: Needs assistance Equipment used: None Transfers: Sit to/from Stand Sit to Stand: Supervision           General transfer comment: for safety, slow to rise and self-steady    Ambulation/Gait Ambulation/Gait assistance: Supervision Gait Distance (Feet): 260 Feet Assistive device: None Gait Pattern/deviations: Step-through pattern;Decreased stride length;Decreased dorsiflexion - left Gait velocity: decr     General Gait Details: for safety, prominent hemi-gait demonstrated in decreased swing phase, DF, hip and knee flexion during swing. Somewhat improved with further gait distance; utilized rollator for last 60 ft gait, required min assist for L hand placement on RW .  Stairs            Wheelchair Mobility    Modified Rankin (Stroke Patients Only) Modified Rankin (Stroke Patients Only) Pre-Morbid Rankin Score: Moderate disability Modified Rankin: Moderate disability     Balance Overall balance assessment: Needs assistance;History of Falls Sitting-balance support: Feet supported;No upper extremity supported Sitting balance-Leahy Scale: Good     Standing balance support: No upper extremity supported;During functional activity Standing balance-Leahy Scale: Fair                               Pertinent Vitals/Pain Pain Assessment: Faces Faces Pain Scale: No hurt Pain Intervention(s): Monitored during session    Home Living Family/patient expects to be discharged to:: Private residence Living Arrangements: Alone Available Help at Discharge: Family;Available 24 hours/day Type of Home: Apartment Home Access: Level entry       Home Layout: One level        Prior Function Prior Level of Function : Independent/Modified  Independent;History of Falls (last six months)             Mobility Comments: pt reports using rollator for mobility prior to it breaking, now uses no AD. Pt states he  has had falls, most recently 1.5 weeks ago. ADLs Comments: per pt, he is supposed to be getting an aide. Pt states some things were hard for him given his L hemiparesis, does not specify what is hard. Pt's ex-significant other or family/friends takes him to the store to do his grocery shopping, he ubers to appointments.     Hand Dominance   Dominant Hand: Right    Extremity/Trunk Assessment   Upper Extremity Assessment Upper Extremity Assessment: Defer to OT evaluation    Lower Extremity Assessment Lower Extremity Assessment: Generalized weakness;LLE deficits/detail LLE Deficits / Details: from chronic cva, pt functionally demonstrates decreased DF, hip and knee flexion. Hemi gait during eval, improved with further distance    Cervical / Trunk Assessment Cervical / Trunk Assessment: Normal  Communication   Communication: No difficulties  Cognition Arousal/Alertness: Awake/alert Behavior During Therapy: Flat affect Overall Cognitive Status: No family/caregiver present to determine baseline cognitive functioning                                 General Comments: pt with slightly increased processing time, some lack of insight into deficits.        General Comments      Exercises     Assessment/Plan    PT Assessment Patient needs continued PT services  PT Problem List Decreased strength;Decreased mobility;Decreased safety awareness;Impaired tone;Decreased knowledge of precautions;Decreased coordination;Decreased activity tolerance;Decreased balance;Decreased knowledge of use of DME;Decreased cognition       PT Treatment Interventions DME instruction;Therapeutic activities;Gait training;Therapeutic exercise;Patient/family education;Balance training;Stair training;Functional mobility training;Neuromuscular re-education    PT Goals (Current goals can be found in the Care Plan section)  Acute Rehab PT Goals PT Goal Formulation: With patient Time For Goal  Achievement: 02/12/21 Potential to Achieve Goals: Good    Frequency Min 4X/week   Barriers to discharge        Co-evaluation               AM-PAC PT "6 Clicks" Mobility  Outcome Measure Help needed turning from your back to your side while in a flat bed without using bedrails?: None Help needed moving from lying on your back to sitting on the side of a flat bed without using bedrails?: None Help needed moving to and from a bed to a chair (including a wheelchair)?: None Help needed standing up from a chair using your arms (e.g., wheelchair or bedside chair)?: None Help needed to walk in hospital room?: A Little Help needed climbing 3-5 steps with a railing? : A Little 6 Click Score: 22    End of Session Equipment Utilized During Treatment: Gait belt Activity Tolerance: Patient tolerated treatment well Patient left: with call bell/phone within reach;in chair;Other (comment) (at sink with BSC behind pt, NT notified to head into pt's room) Nurse Communication: Mobility status PT Visit Diagnosis: Other abnormalities of gait and mobility (R26.89);Hemiplegia and hemiparesis Hemiplegia - Right/Left: Left Hemiplegia - dominant/non-dominant: Non-dominant Hemiplegia - caused by: Cerebral infarction    Time: 0916-0929 PT Time Calculation (min) (ACUTE ONLY): 13 min   Charges:   PT Evaluation $PT Eval Low Complexity: 1 Low         Jontrell Bushong S, PT DPT Acute Rehabilitation Services Pager  [place] 01/29/2021, 10:52 AM

## 2021-01-29 NOTE — Plan of Care (Signed)

## 2021-01-29 NOTE — Progress Notes (Signed)
Patient ID: Bobby Branch, male   DOB: 1960/04/30, 60 y.o.   MRN: [id number]  PROGRESS NOTE    Bobby Branch  PRF:[id number] DOB: 11-Apr-1960 DOA: 01/27/2021 PCP: Charlott Rakes, MD   Brief Narrative:  60 y.o. 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  On presentation, neurology was consulted.  CT of the head was negative for acute finding.  MRI of brain showed no acute abnormality, showed chronic cortical infarction right MCA and ACA territory.  CT angio of of head/neck showed severe stenosis of left ICA; right carotid artery stenting with involved IntraStent thrombus occupying 50% of lumen.  He was started on aspirin and Plavix.  Assessment & Plan:   Right-sided scalp tingling in a patient with history of previous stroke with residual spastic hemiplegia -MRI of brain was negative for acute findings -Neurology following.  Continue aspirin, Plavix and statin -Symptoms improving. -PT/OT eval.  Bilateral carotid stenosis status post right carotid stenting -Imaging findings as above.  Continue aspirin and Plavix and statin. -For possible carotid stenting by Dr. Estanislado Pandy today.  Keep n.p.o. for now.  Essential -Blood pressure elevated.  Continue Coreg, hydralazine, losartan.  Monitor blood pressure.  Diabetes mellitus type 2 with hyperglycemia and hypoglycemia insulin-A1c 6.5.  Continue CBGs with SSI  Hyperlipidemia From LDL 114.  Continue Lipitor  Chronically disease stage IIIb  -Baseline creatinine 2-2.4.  Currently at baseline.  Repeat a.m. labs  History of polysubstance abuse -Denies recent cocaine use.  Urine drug screen negative  Depression -Continue Cymbalta   DVT prophylaxis: SCDs Code Status: Full Family Communication: None at bedside Disposition Plan: Status is: Inpatient  Remains  inpatient appropriate because: Of need for carotid stenting    Consultants: Neurology/neuro-interventionist  Procedures: None  Antimicrobials: None   Subjective: Patient seen and examined at bedside.  Poor historian.  Feels that his symptoms are improving.  Denies worsening fever, nausea, vomiting.  Objective: Vitals:   01/28/21 2100 01/28/21 2300 01/29/21 0324 01/29/21 0810  BP: (!) 171/100 (!) 187/98 (!) 193/96 (!) 151/88  Pulse: 66 79 82 94  Resp: 18 18 18 16   Temp: 98.6 F (37 C) 98 F (36.7 C) 98.7 F (37.1 C) 98.6 F (37 C)  TempSrc: Oral Oral Oral Oral  SpO2: 100% 100% 100% 98%  Weight:      Height:        Intake/Output Summary (Last 24 hours) at 01/29/2021 1023 Last data filed at 01/29/2021 0810 Gross per 24 hour  Intake 500.7 ml  Output 600 ml  Net -99.3 ml   Filed Weights   01/27/21 2214  Weight: 62.1 kg    Examination:  General exam: Appears calm and comfortable.  Looks chronically ill.  Currently on room air. Respiratory system: Bilateral decreased breath sounds at bases Cardiovascular system: S1 & S2 heard, Rate controlled Gastrointestinal system: Abdomen is nondistended, soft and nontender. Normal bowel sounds heard. Extremities: No cyanosis, clubbing, edema  Central nervous system: Awake, slow to respond, poor historian.  Left-sided weakness present.   Skin: No rashes, lesions or ulcers Psychiatry: Affect is mostly flat.  Does not participate in conversation much.   Data Reviewed: I have personally reviewed following labs and imaging studies  CBC: Recent Labs  Lab 01/27/21 2302  WBC 8.0  NEUTROABS 5.0  HGB 13.1  HCT 39.7  MCV 95.7  PLT 339   Basic  Metabolic Panel: Recent Labs  Lab 01/27/21 2302  NA 139  K 4.3  CL 107  CO2 27  GLUCOSE 74  BUN 19  CREATININE 2.03*  CALCIUM 8.9   GFR: Estimated Creatinine Clearance: 34.5 mL/min (A) (by C-G formula based on SCr of 2.03 mg/dL (H)). Liver Function Tests: Recent Labs  Lab  01/27/21 2302  AST 23  ALT 22  ALKPHOS 71  BILITOT 0.5  PROT 6.8  ALBUMIN 3.3*   No results for input(s): LIPASE, AMYLASE in the last 168 hours. No results for input(s): AMMONIA in the last 168 hours. Coagulation Profile: No results for input(s): INR, PROTIME in the last 168 hours. Cardiac Enzymes: No results for input(s): CKTOTAL, CKMB, CKMBINDEX, TROPONINI in the last 168 hours. BNP (last 3 results) No results for input(s): PROBNP in the last 8760 hours. HbA1C: Recent Labs    01/29/21 0036  HGBA1C 6.5*   CBG: Recent Labs  Lab 01/28/21 1658 01/28/21 2101 01/28/21 2207 01/29/21 0849  GLUCAP 235* 55* 139* 101*   Lipid Profile: Recent Labs    01/29/21 0036  CHOL 197  HDL 45  LDLCALC 114*  TRIG 191*  CHOLHDL 4.4   Thyroid Function Tests: No results for input(s): TSH, T4TOTAL, FREET4, T3FREE, THYROIDAB in the last 72 hours. Anemia Panel: No results for input(s): VITAMINB12, FOLATE, FERRITIN, TIBC, IRON, RETICCTPCT in the last 72 hours. Sepsis Labs: No results for input(s): PROCALCITON, LATICACIDVEN in the last 168 hours.  Recent Results (from the past 240 hour(s))  Resp Panel by RT-PCR (Flu A&B, Covid) Nasopharyngeal Swab     Status: None   Collection Time: 01/28/21  5:34 PM   Specimen: Nasopharyngeal Swab; Nasopharyngeal(NP) swabs in vial transport medium  Result Value Ref Range Status   SARS Coronavirus 2 by RT PCR NEGATIVE NEGATIVE Final    Comment: (NOTE) SARS-CoV-2 target nucleic acids are NOT DETECTED.  The SARS-CoV-2 RNA is generally detectable in upper respiratory specimens during the acute phase of infection. The lowest concentration of SARS-CoV-2 viral copies this assay can detect is 138 copies/mL. A negative result does not preclude SARS-Cov-2 infection and should not be used as the sole basis for treatment or other patient management decisions. A negative result may occur with  improper specimen collection/handling, submission of specimen  other than nasopharyngeal swab, presence of viral mutation(s) within the areas targeted by this assay, and inadequate number of viral copies(<138 copies/mL). A negative result must be combined with clinical observations, patient history, and epidemiological information. The expected result is Negative.  Fact Sheet for Patients:  EntrepreneurPulse.com.au  Fact Sheet for Healthcare Providers:  IncredibleEmployment.be  This test is no t yet approved or cleared by the [place] FDA and  has been authorized for detection and/or diagnosis of SARS-CoV-2 by FDA under an Emergency Use Authorization (EUA). This EUA will remain  in effect (meaning this test can be used) for the duration of the COVID-19 declaration under Section 564(b)(1) of the Act, 21 U.S.C.section 360bbb-3(b)(1), unless the authorization is terminated  or revoked sooner.       Influenza A by PCR NEGATIVE NEGATIVE Final   Influenza B by PCR NEGATIVE NEGATIVE Final    Comment: (NOTE) The Xpert Xpress SARS-CoV-2/FLU/RSV plus assay is intended as an aid in the diagnosis of influenza from Nasopharyngeal swab specimens and should not be used as a sole basis for treatment. Nasal washings and aspirates are unacceptable for Xpert Xpress SARS-CoV-2/FLU/RSV testing.  Fact Sheet for Patients: EntrepreneurPulse.com.au  Fact Sheet for Healthcare  Providers: IncredibleEmployment.be  This test is not yet approved or cleared by the [place] and has been authorized for detection and/or diagnosis of SARS-CoV-2 by FDA under an Emergency Use Authorization (EUA). This EUA will remain in effect (meaning this test can be used) for the duration of the COVID-19 declaration under Section 564(b)(1) of the Act, 21 U.S.C. section 360bbb-3(b)(1), unless the authorization is terminated or revoked.  Performed at [facility], [place] [address], [place],  [place] [address]   Surgical PCR screen     Status: Abnormal   Collection Time: 01/29/21  3:32 AM   Specimen: Nasal Mucosa; Nasal Swab  Result Value Ref Range Status   MRSA, PCR NEGATIVE NEGATIVE Final   Staphylococcus aureus POSITIVE (A) NEGATIVE Final    Comment: (NOTE) The Xpert SA Assay (FDA approved for NASAL specimens in patients 45 years of age and older), is one component of a comprehensive surveillance program. It is not intended to diagnose infection nor to guide or monitor treatment. Performed at [facility], [place] [address] [place], [place] [address]          Radiology Studies: CT ANGIO HEAD NECK W WO CM  Result Date: 01/28/2021 CLINICAL DATA:  Neuro deficit, acute, stroke suspected. MRI negative for acute insult. EXAM: CT ANGIOGRAPHY HEAD AND NECK TECHNIQUE: Multidetector CT imaging of the head and neck was performed using the standard protocol during bolus administration of intravenous contrast. Multiplanar CT image reconstructions and MIPs were obtained to evaluate the vascular anatomy. Carotid stenosis measurements (when applicable) are obtained utilizing NASCET criteria, using the distal internal carotid diameter as the denominator. CONTRAST:  91mL OMNIPAQUE IOHEXOL 350 MG/ML SOLN COMPARISON:  MRI same day.  Head CT same day. FINDINGS: CTA NECK FINDINGS Aortic arch: Aortic atherosclerosis. No aneurysm or dissection. Branching pattern is normal without origin stenosis. Right carotid system: Common carotid artery widely patent to the distal extent. Carotid artery stent beginning at the distal common carotid artery, extending through the ICA bulb into the proximal cervical ICA. There is filling defect within the stent that occupies approximately 50% of the diameter. Patent diameter measures 1.5 mm. Beyond the stent, the cervical ICA is widely patent to the skull base. Left carotid system: Common carotid artery widely patent to the bifurcation. Extensive soft and calcified  plaque at the carotid bifurcation and ICA bulb with ulceration. Lumen of the proximal ICA is markedly narrowed, with serial stenoses showing a diameter of 1 mm. Beyond that, the ICA shows a flow reduced diameter but is patent to the skull base. Vertebral arteries: Both vertebral artery origins are widely patent. Both vertebral arteries are patent through the cervical region to the foramen magnum, the left being larger than the right. Skeleton: Ordinary cervical spondylosis. Other neck: No mass or lymphadenopathy. Upper chest: Lung apices are clear. Review of the MIP images confirms the above findings CTA HEAD FINDINGS Anterior circulation: Both internal carotid arteries are patent through the skull base and siphon regions. There is ordinary siphon atherosclerotic calcification but no stenosis. The anterior and middle cerebral vessels are patent. No large vessel occlusion or correctable proximal stenosis. No aneurysm or vascular malformation. Posterior circulation: Both vertebral arteries are patent through the foramen magnum to the basilar. No basilar stenosis. Posterior circulation branch vessels are patent. There are bilateral patent posterior communicating arteries. Venous sinuses: Patent and normal. Anatomic variants: None significant. Review of the MIP images confirms the above findings IMPRESSION: Aortic atherosclerosis. Carotid artery stent on the  right extending from the distal common carotid artery over a length of 4 cm to the cervical ICA beyond the bulb. In the bulb region, there is apparent intra stent thrombus occupying 50% of the lumen. Patent lumen diameter is 1.5 mm. Compared to a more distal cervical ICA diameter of 5 mm, this indicates a 70% stenosis within the stent. Complex soft and calcified plaque at the left carotid bifurcation and ICA bulb. Areas of ulceration. Serial severe stenoses in the ICA bulb with diameter 1 mm or less. Flow reduced diameter of the more distal cervical ICA.  Considerable risk of occlusion or embolic event from this disease. This disease appears quite similar to the previous CT angiogram from 04/20/2020. No intracranial large vessel occlusion or correctable proximal stenosis. Electronically Signed   By: Nelson Chimes M.D.   On: 01/28/2021 11:28   DG Chest 2 View  Result Date: 01/28/2021 CLINICAL DATA:  Chest pain EXAM: CHEST - 2 VIEW COMPARISON:  05/01/2020 FINDINGS: Lungs are clear.  No pleural effusion or pneumothorax. The heart is normal in size.  Thoracic aortic atherosclerosis. Visualized osseous structures are within normal limits. IMPRESSION: Normal chest radiographs. Electronically Signed   By: Julian Hy M.D.   On: 01/28/2021 07:06   CT Head Wo Contrast  Result Date: 01/28/2021 CLINICAL DATA:  Hypertensive emergency EXAM: CT HEAD WITHOUT CONTRAST TECHNIQUE: Contiguous axial images were obtained from the base of the skull through the vertex without intravenous contrast. COMPARISON:  04/20/2020 FINDINGS: Brain: There is no mass, hemorrhage or extra-axial collection. There is generalized atrophy without lobar predilection. Hypodensity of the white matter is most commonly associated with chronic microvascular disease. Old right frontal and parietal infarcts. Old small vessel infarct along the posterior limb of the right internal capsule. Vascular: No abnormal hyperdensity of the major intracranial arteries or dural venous sinuses. No intracranial atherosclerosis. Skull: The visualized skull base, calvarium and extracranial soft tissues are normal. Sinuses/Orbits: No fluid levels or advanced mucosal thickening of the visualized paranasal sinuses. No mastoid or middle ear effusion. The orbits are normal. IMPRESSION: 1. No acute intracranial abnormality. 2. Old right frontal and parietal infarcts and findings of chronic microvascular ischemia. Electronically Signed   By: Ulyses Jarred M.D.   On: 01/28/2021 00:20   MR ANGIO HEAD WO CONTRAST  Result  Date: 01/28/2021 CLINICAL DATA:  60 year old male hypertensive emergency. Stroke-like symptoms. EXAM: MRA HEAD WITHOUT CONTRAST TECHNIQUE: Angiographic images of the Circle of Willis were acquired using MRA technique without intravenous contrast. COMPARISON:  Brain MRI today.  CTA head and neck 04/20/2020. FINDINGS: Degraded by motion artifact. Posterior circulation: Antegrade flow in the posterior circulation. Distal vertebral arteries appears stable since March, the left is mildly dominant. Motion artifact affecting the basilar artery which remains patent. No definite vertebrobasilar stenosis. PCA origins and small posterior communicating arteries are patent. PCA branch detail is degraded. Anterior circulation: Antegrade flow in both ICA siphons which appear to remain patent to the carotid termini. ICA siphon detail however is degraded. MCA and ACA origins are patent. M1 and A1 segments are patent. But other MCA and ACA branch detail is degraded. Anatomic variants: Dominant left vertebral artery. Other: None. IMPRESSION: 1. Motion degraded intracranial MRA. 2. Reperfused Right ICA siphon since March CTA. No large vessel occlusion today. But no detail of the 2nd order or more distal circle-of-Willis branches. Electronically Signed   By: Genevie Ann M.D.   On: 01/28/2021 08:48   MR BRAIN WO CONTRAST  Result Date: 01/28/2021 CLINICAL  DATA:  60 year old male hypertensive emergency. Stroke-like symptoms. EXAM: MRI HEAD WITHOUT CONTRAST TECHNIQUE: Multiplanar, multiecho pulse sequences of the brain and surrounding structures were obtained without intravenous contrast. COMPARISON:  Head CT 01/28/2021.  Brain MRI 04/21/2020. FINDINGS: Study is intermittently degraded by motion artifact despite repeated imaging attempts. Brain: No convincing restricted diffusion to suggest acute infarction. Mild susceptibility artifact on DWI in the right ACA territory appears related to chronic encephalomalacia with hemosiderin.  Superimposed chronic right MCA territory encephalomalacia and gliosis, especially involving the right perirolandic cortex and operculum. Superimposed chronic lacunar infarcts in the bilateral basal ganglia and pons. Superimposed brainstem Wallerian degeneration greater on the right. Several small chronic cerebellar infarcts. No midline shift, mass effect, evidence of mass lesion, ventriculomegaly, extra-axial collection or acute intracranial hemorrhage. Cervicomedullary junction and pituitary are within normal limits. Vascular: Major intracranial vascular flow voids are preserved, stable. Some generalized intracranial artery tortuosity. See MRA today reported separately. Skull and upper cervical spine: Grossly negative cervical spine, normal visible bone marrow signal. Sinuses/Orbits: Stable and negative. Other: Mastoids remain well aerated. Negative visible scalp and face. IMPRESSION: 1. No acute intracranial abnormality. 2. Advanced chronic ischemic disease. Chronic cortical infarcts in the right MCA and ACA territory, bilateral small vessel disease, some brainstem Wallerian degeneration. Electronically Signed   By: Genevie Ann M.D.   On: 01/28/2021 08:43        Scheduled Meds:  aspirin EC  81 mg Oral Daily   atorvastatin  40 mg Oral Daily   carvedilol  12.5 mg Oral BID WC   clopidogrel  75 mg Oral Daily   diclofenac Sodium  2 g Topical QID   DULoxetine  60 mg Oral Daily   famotidine  20 mg Oral Daily   hydrALAZINE  50 mg Oral BID   insulin aspart  0-9 Units Subcutaneous TID WC   losartan  25 mg Oral BID   multivitamin with minerals  1 tablet Oral Daily   mupirocin ointment  1 application Nasal BID   nicotine  21 mg Transdermal Daily   sodium chloride flush  3 mL Intravenous Q12H   tiZANidine  2 mg Oral TID   Continuous Infusions:  sodium chloride            Aline August, MD [facility] 01/29/2021, 10:23 AM

## 2021-01-29 NOTE — Progress Notes (Addendum)
STROKE TEAM PROGRESS NOTE   INTERVAL HISTORY No family at bedside.  Patient lying in bed, no acute event.  Initially planned for left carotid stenting but canceled due to emergency procedures.  Plan to have left carotid stenting done tomorrow.  Vitals:   01/28/21 2100 01/28/21 2300 01/29/21 0324 01/29/21 0810  BP: (!) 171/100 (!) 187/98 (!) 193/96 (!) 151/88  Pulse: 66 79 82 94  Resp: 18 18 18 16   Temp: 98.6 F (37 C) 98 F (36.7 C) 98.7 F (37.1 C) 98.6 F (37 C)  TempSrc: Oral Oral Oral Oral  SpO2: 100% 100% 100% 98%  Weight:      Height:       CBC:  Recent Labs  Lab 01/27/21 2302  WBC 8.0  NEUTROABS 5.0  HGB 13.1  HCT 39.7  MCV 95.7  PLT 921   Basic Metabolic Panel:  Recent Labs  Lab 01/27/21 2302  NA 139  K 4.3  CL 107  CO2 27  GLUCOSE 74  BUN 19  CREATININE 2.03*  CALCIUM 8.9   Lipid Panel:  Recent Labs  Lab 01/29/21 0036  CHOL 197  TRIG 191*  HDL 45  CHOLHDL 4.4  VLDL 38  LDLCALC 114*   HgbA1c:  Recent Labs  Lab 01/29/21 0036  HGBA1C 6.5*   Urine Drug Screen:  Recent Labs  Lab 01/29/21 0030  LABOPIA NONE DETECTED  COCAINSCRNUR NONE DETECTED  LABBENZ NONE DETECTED  AMPHETMU NONE DETECTED  THCU NONE DETECTED  LABBARB NONE DETECTED    Alcohol Level No results for input(s): ETH in the last 168 hours.  IMAGING past 24 hours CT ANGIO HEAD NECK W WO CM  Result Date: 01/28/2021 CLINICAL DATA:  Neuro deficit, acute, stroke suspected. MRI negative for acute insult. EXAM: CT ANGIOGRAPHY HEAD AND NECK TECHNIQUE: Multidetector CT imaging of the head and neck was performed using the standard protocol during bolus administration of intravenous contrast. Multiplanar CT image reconstructions and MIPs were obtained to evaluate the vascular anatomy. Carotid stenosis measurements (when applicable) are obtained utilizing NASCET criteria, using the distal internal carotid diameter as the denominator. CONTRAST:  21mL OMNIPAQUE IOHEXOL 350 MG/ML SOLN  COMPARISON:  MRI same day.  Head CT same day. FINDINGS: CTA NECK FINDINGS Aortic arch: Aortic atherosclerosis. No aneurysm or dissection. Branching pattern is normal without origin stenosis. Right carotid system: Common carotid artery widely patent to the distal extent. Carotid artery stent beginning at the distal common carotid artery, extending through the ICA bulb into the proximal cervical ICA. There is filling defect within the stent that occupies approximately 50% of the diameter. Patent diameter measures 1.5 mm. Beyond the stent, the cervical ICA is widely patent to the skull base. Left carotid system: Common carotid artery widely patent to the bifurcation. Extensive soft and calcified plaque at the carotid bifurcation and ICA bulb with ulceration. Lumen of the proximal ICA is markedly narrowed, with serial stenoses showing a diameter of 1 mm. Beyond that, the ICA shows a flow reduced diameter but is patent to the skull base. Vertebral arteries: Both vertebral artery origins are widely patent. Both vertebral arteries are patent through the cervical region to the foramen magnum, the left being larger than the right. Skeleton: Ordinary cervical spondylosis. Other neck: No mass or lymphadenopathy. Upper chest: Lung apices are clear. Review of the MIP images confirms the above findings CTA HEAD FINDINGS Anterior circulation: Both internal carotid arteries are patent through the skull base and siphon regions. There is ordinary siphon atherosclerotic  calcification but no stenosis. The anterior and middle cerebral vessels are patent. No large vessel occlusion or correctable proximal stenosis. No aneurysm or vascular malformation. Posterior circulation: Both vertebral arteries are patent through the foramen magnum to the basilar. No basilar stenosis. Posterior circulation branch vessels are patent. There are bilateral patent posterior communicating arteries. Venous sinuses: Patent and normal. Anatomic variants: None  significant. Review of the MIP images confirms the above findings IMPRESSION: Aortic atherosclerosis. Carotid artery stent on the right extending from the distal common carotid artery over a length of 4 cm to the cervical ICA beyond the bulb. In the bulb region, there is apparent intra stent thrombus occupying 50% of the lumen. Patent lumen diameter is 1.5 mm. Compared to a more distal cervical ICA diameter of 5 mm, this indicates a 70% stenosis within the stent. Complex soft and calcified plaque at the left carotid bifurcation and ICA bulb. Areas of ulceration. Serial severe stenoses in the ICA bulb with diameter 1 mm or less. Flow reduced diameter of the more distal cervical ICA. Considerable risk of occlusion or embolic event from this disease. This disease appears quite similar to the previous CT angiogram from 04/20/2020. No intracranial large vessel occlusion or correctable proximal stenosis. Electronically Signed   By: Nelson Chimes M.D.   On: 01/28/2021 11:28    PHYSICAL EXAM  Temp:  [98 F (36.7 C)-98.7 F (37.1 C)] 98.1 F (36.7 C) (12/27 1546) Pulse Rate:  [66-104] 104 (12/27 1546) Resp:  [12-18] 14 (12/27 1546) BP: (122-193)/(68-110) 122/68 (12/27 1546) SpO2:  [97 %-100 %] 97 % (12/27 1546)  General - Well nourished, well developed, in no apparent distress.  Ophthalmologic - fundi not visualized due to noncooperation.  Cardiovascular - Regular rhythm and rate.  Mental Status -  Level of arousal and orientation to time, place, and person were intact. Language including expression, naming, repetition, comprehension was assessed and found intact. Fund of Knowledge was assessed and was intact.  Cranial Nerves II - XII - II - Visual field intact OU. III, IV, VI - Extraocular movements intact. V - Facial sensation intact bilaterally. VII - left facial droop VIII - Hearing & vestibular intact bilaterally. X - Palate elevates symmetrically.  Slight dysarthria XI - Chin turning &  shoulder shrug intact bilaterally. XII - Tongue protrusion intact.  Motor Strength - The patients strength was normal in RUE and RLE.  However, left upper extremity flexion contracture, with only slight finger movement and bicep movement.  Left lower extremity proximal 4/5, distal ankle DF/PF 2/5.  Bulk was normal and fasciculations were absent.   Motor Tone - Muscle tone was assessed at the neck and appendages and was increased left upper extremity.  Reflexes - The patients reflexes were 3+ in LUE and LLE and he had positive Babinski on the left.  Sensory - Light touch, temperature/pinprick were assessed and were symmetrical.    Coordination - The patient had normal movements in the right hand with no ataxia or dysmetria.  Tremor was absent.  Gait and Station - deferred.   ASSESSMENT/PLAN Bobby Branch is a 60 y.o. male with history of previous Rt MCA stroke, T2DM, HTN, CKD, HLD, polysubstance abuse, and constipation presenting with right side scalp tingling. He was initially supposed to have his L carotid artery stented however, he had issues with transportation.   TIA:  left brain TIA likely secondary due to left ICA high grade stenosis CT head-Old right frontal and parietal infarcts CTA head & neck-  right carotid artery stent filling defect within the stent with 70% stenosis within the stent. Left carotid artery withy extensive soft and calcified plaque at the carotid bifurcation and ICA bulb with ulceration. Lumen of the proximal ICA is markedly narrowed, with serial stenoses showing a diameter of 8mm.  MRI  no acute intracranial abnormality. Chronic cortical infarcts in the right MCA and ACA territory. 2D Echo EF 60-65%, LV grade 1 diastolic dysfunction, no shunt detected LDL 114 HgbA1c 6.5 UDS negative VTE prophylaxis - Lovenox ASA 81 prior to admission, now on aspirin 81 mg daily and brilinta after loading Therapy recommendations:  Pending Disposition:  Pending  Bilateral  carotid Stenosis  CTA head and neck right ICA stent filling defect with 70% stenosis, left ICA severe stenosis. Lt carotid artery stenting planned for 01/30/2021 by Dr. Estanislado Pandy  History of stroke 09/2019 stroke with mild residual, details not available 04/2020 admitted for left-sided weakness numbness and right gaze.  CT no acute abnormality.  CTA head neck right M2/M3 occlusion, right ICA occlusion, left ICA string sign.  Status post tPA and IR with TICI3 reperfusion.  Right ICA 40% stenosis at end of procedure, no stent placed.  Postprocedure, carotid Doppler right ICA 60 to 79% stenosis, left ICA 80 to 99% stenosis.  MRI showed right MCA patchy small infarcts, and right small cerebellum infarct.  EF 55 to 60%.  UDS showed positive cocaine and THC.  LDL 98, A1c 7.3.  Discharged on DAPT for 3 months as well as Lipitor 40. 06/2020 had right ICA stenting with Dr. Earleen Newport. P2Y12 87 in 12/2020  Hypertension Home meds:  losartan 25mg  daily, coreg 12.5 BID Stable Avoid low BP BP goal 130-150 before carotid revascularization  Hyperlipidemia Home meds:  Atorvastatin 40mg  LDL 114, goal < 70 Increase Lipitor to 80 Continue statin at discharge  Tobacco abuse Current smoker Smoking cessation counseling provided Nicotine patch provided Pt is willing to quit  Other Stroke Risk Factors History of substance abuse    Other Active Problems CKD IIIa, creatinine 2.03  Hospital day # 1  Rosalin Hawking, MD PhD Stroke Neurology 01/29/2021 6:03 PM  Extensive chart review.  I discussed with Dr. Estanislado Pandy.  I spent  35 minutes in total face-to-face time with the patient, more than 50% of which was spent in counseling and coordination of care, reviewing test results, images and medication, and discussing the diagnosis, treatment plan and potential prognosis. This patient's care requiresreview of multiple databases, neurological assessment, discussion with family, other specialists and medical decision  making of high complexity.      To contact Stroke Continuity provider, please refer to [email or web address] After hours, contact General Neurology

## 2021-01-29 NOTE — Plan of Care (Signed)
  Problem: Education: Goal: Knowledge of General Education information will improve Description Including pain rating scale, medication(s)/side effects and non-pharmacologic comfort measures Outcome: Progressing   

## 2021-01-29 NOTE — TOC Initial Note (Signed)
Transition of Care [facility]) - Initial/Assessment Note    Patient Details  Name: Bobby Branch MRN: [id number] Date of Birth: [birth year]  Transition of Care [facility]) CM/SW Contact:    Pollie Friar, RN Phone Number: 01/29/2021, 11:55 AM  Clinical Narrative:                 Patient lives at home alone. He states he has the forms for aide services at home through his medicaid but has not gotten to the MD to have them do their part. CM was able to print the forms from the medicaid site and will fax them in.  Pt states he also has trouble getting transport to some of his MD appointments. He says neurology and [facility] are covered but he then runs out of "trips". CM spoke to [facility] and the new office or on of his other MD offices just needs to call and get a prior auth and he will have transport. Pt updated. Pt states he is already set up with [facility] transportation services.  Pt states he does sometimes have issues affording his medications. CM updated him that since he has medicaid there is no other assistance CM can provide. He understands. Pt will need to have PT/OT re evaluate after stent placement for needs prior to d/c.  ToC following.  Expected Discharge Plan: [place] Barriers to Discharge: Continued Medical Work up   Patient Goals and CMS Choice        Expected Discharge Plan and Services Expected Discharge Plan: [place]   Discharge Planning Services: CM Consult Post Acute Care Choice: [place] arrangements for the past 2 months: Apartment                                      Prior Living Arrangements/Services Living arrangements for the past 2 months: Apartment Lives with:: Self Patient language and need for interpreter reviewed:: Yes Do you feel safe going back to the place where you live?: Yes        Care giver support system in place?: No (comment) Current home services: DME (cane/ walker/ shower seat) Criminal  Activity/Legal Involvement Pertinent to Current Situation/Hospitalization: No - Comment as needed  Activities of Daily Living Home Assistive Devices/Equipment: None ADL Screening (condition at time of admission) Patient's cognitive ability adequate to safely complete daily activities?: Yes Is the patient deaf or have difficulty hearing?: No Does the patient have difficulty seeing, even when wearing glasses/contacts?: Yes Does the patient have difficulty concentrating, remembering, or making decisions?: No Patient able to express need for assistance with ADLs?: Yes Does the patient have difficulty dressing or bathing?: Yes Independently performs ADLs?: No Communication: Independent Dressing (OT): Needs assistance Is this a change from baseline?: Pre-admission baseline Grooming: Independent Feeding: Independent Bathing: Needs assistance Is this a change from baseline?: Pre-admission baseline Toileting: Independent In/Out Bed: Independent with device (comment) (Pt will need walker at home, or one assist) Walks in Home: Independent with device (comment) (Pt will need walker at home, or one assist) Does the patient have difficulty walking or climbing stairs?: Yes Weakness of Legs: Left Weakness of Arms/Hands: Left  Permission Sought/Granted                  Emotional Assessment Appearance:: Appears stated age Attitude/Demeanor/Rapport: Engaged Affect (typically observed): Accepting Orientation: : Oriented to Self, Oriented to Place, Oriented  to  Time, Oriented to Situation   Psych Involvement: No (comment)  Admission diagnosis:  Paresthesia [R20.2] Bilateral carotid artery stenosis [I65.23] Carotid stenosis, symptomatic w/o infarct, left [I65.22] Patient Active Problem List   Diagnosis Date Noted   Carotid stenosis, symptomatic w/o infarct, left 01/28/2021   Polysubstance abuse ([place]) 01/28/2021   Depression 01/28/2021   Internal carotid artery stenosis, bilateral  06/21/2020   Slow transit constipation    Spastic hemiplegia affecting nondominant side (HCC)    Stage 3b chronic kidney disease ([place])    Essential hypertension    Controlled type 2 diabetes mellitus with hyperglycemia, without long-term current use of insulin (HCC)    Protein-calorie malnutrition, severe 04/25/2020   Right middle cerebral artery stroke ([place]) 04/23/2020   Acute right MCA stroke ([place]) 04/20/2020   PCP:  Charlott Rakes, MD Pharmacy:   [facility] and [address] [place] [address] Phone: [phone] Fax: [phone]     Social Determinants of Health (SDOH) Interventions    Readmission Risk Interventions No flowsheet data found.

## 2021-01-30 ENCOUNTER — Inpatient Hospital Stay (HOSPITAL_COMMUNITY): Payer: Federal, State, Local not specified - PPO | Admitting: Certified Registered"

## 2021-01-30 ENCOUNTER — Inpatient Hospital Stay (HOSPITAL_COMMUNITY): Payer: Federal, State, Local not specified - PPO

## 2021-01-30 ENCOUNTER — Encounter (HOSPITAL_COMMUNITY): Payer: Self-pay | Admitting: Family Medicine

## 2021-01-30 ENCOUNTER — Other Ambulatory Visit (HOSPITAL_COMMUNITY): Payer: Self-pay

## 2021-01-30 ENCOUNTER — Encounter (HOSPITAL_COMMUNITY)
Admission: EM | Disposition: A | Discharge status: Home / Self Care | Payer: Self-pay | Source: Home / Self Care | Attending: Internal Medicine

## 2021-01-30 DIAGNOSIS — I6522 Occlusion and stenosis of left carotid artery: Secondary | ICD-10-CM | POA: Diagnosis present

## 2021-01-30 HISTORY — PX: IR ANGIO INTRA EXTRACRAN SEL COM CAROTID INNOMINATE BILAT MOD SED: IMG5360

## 2021-01-30 HISTORY — PX: IR INTRAVSC STENT CERV CAROTID W/EMB-PROT MOD SED INCL ANGIO: IMG2303

## 2021-01-30 HISTORY — PX: RADIOLOGY WITH ANESTHESIA: SHX6223

## 2021-01-30 HISTORY — PX: IR CT HEAD LTD: IMG2386

## 2021-01-30 LAB — CBC WITH DIFFERENTIAL/PLATELET
Abs Immature Granulocytes: 0.02 10*3/uL (ref 0.00–0.07)
Basophils Absolute: 0 10*3/uL (ref 0.0–0.1)
Basophils Relative: 1 %
Eosinophils Absolute: 0.4 10*3/uL (ref 0.0–0.5)
Eosinophils Relative: 6 %
HCT: 34.7 % — ABNORMAL LOW (ref 39.0–52.0)
Hemoglobin: 11.8 g/dL — ABNORMAL LOW (ref 13.0–17.0)
Immature Granulocytes: 0 %
Lymphocytes Relative: 31 %
Lymphs Abs: 1.9 10*3/uL (ref 0.7–4.0)
MCH: 31.1 pg (ref 26.0–34.0)
MCHC: 34 g/dL (ref 30.0–36.0)
MCV: 91.6 fL (ref 80.0–100.0)
Monocytes Absolute: 0.6 10*3/uL (ref 0.1–1.0)
Monocytes Relative: 10 %
Neutro Abs: 3.2 10*3/uL (ref 1.7–7.7)
Neutrophils Relative %: 52 %
Platelets: 296 10*3/uL (ref 150–400)
RBC: 3.79 MIL/uL — ABNORMAL LOW (ref 4.22–5.81)
RDW: 13.5 % (ref 11.5–15.5)
WBC: 6.1 10*3/uL (ref 4.0–10.5)
nRBC: 0 % (ref 0.0–0.2)

## 2021-01-30 LAB — BASIC METABOLIC PANEL
Anion gap: 9 (ref 5–15)
BUN: 28 mg/dL — ABNORMAL HIGH (ref 6–20)
CO2: 23 mmol/L (ref 22–32)
Calcium: 8.5 mg/dL — ABNORMAL LOW (ref 8.9–10.3)
Chloride: 104 mmol/L (ref 98–111)
Creatinine, Ser: 2.6 mg/dL — ABNORMAL HIGH (ref 0.61–1.24)
GFR, Estimated: 28 mL/min — ABNORMAL LOW (ref >60)
Glucose, Bld: 74 mg/dL (ref 70–99)
Potassium: 4.1 mmol/L (ref 3.5–5.1)
Sodium: 136 mmol/L (ref 135–145)

## 2021-01-30 LAB — GLUCOSE, CAPILLARY
Glucose-Capillary: 68 mg/dL — ABNORMAL LOW (ref 70–99)
Glucose-Capillary: 153 mg/dL — ABNORMAL HIGH (ref 70–99)
Glucose-Capillary: 99 mg/dL (ref 70–99)
Glucose-Capillary: 128 mg/dL — ABNORMAL HIGH (ref 70–99)
Glucose-Capillary: 174 mg/dL — ABNORMAL HIGH (ref 70–99)

## 2021-01-30 LAB — POCT ACTIVATED CLOTTING TIME
Activated Clotting Time: 233 seconds
Activated Clotting Time: 233 seconds

## 2021-01-30 LAB — MAGNESIUM: Magnesium: 2 mg/dL (ref 1.7–2.4)

## 2021-01-30 LAB — PROTIME-INR
INR: 0.9 (ref 0.8–1.2)
Prothrombin Time: 12.2 seconds (ref 11.4–15.2)

## 2021-01-30 SURGERY — IR WITH ANESTHESIA
Anesthesia: General | Laterality: Left

## 2021-01-30 MED ORDER — DEXTROSE 50 % IV SOLN
INTRAVENOUS | Status: AC
  Administered 2021-01-30: 07:00:00 25 mL
  Filled 2021-01-30: qty 50

## 2021-01-30 MED ORDER — SODIUM CHLORIDE 0.9 % IV SOLN: INTRAVENOUS | Status: DC

## 2021-01-30 MED ORDER — TICAGRELOR 90 MG PO TABS
90.0000 mg | ORAL_TABLET | Freq: Two times a day (BID) | ORAL | Status: DC
  Filled 2021-01-30 (×3): qty 1

## 2021-01-30 MED ORDER — PROTAMINE SULFATE 10 MG/ML IV SOLN
INTRAVENOUS | Status: DC | PRN
  Administered 2021-01-30: 5 mg via INTRAVENOUS

## 2021-01-30 MED ORDER — ESMOLOL HCL 100 MG/10ML IV SOLN
INTRAVENOUS | Status: DC | PRN
  Administered 2021-01-30 (×3): 20 mg via INTRAVENOUS

## 2021-01-30 MED ORDER — FENTANYL CITRATE PF 50 MCG/ML IJ SOSY: 12.5000 ug | PREFILLED_SYRINGE | Freq: Once | INTRAMUSCULAR | Status: DC

## 2021-01-30 MED ORDER — HEPARIN (PORCINE) 25000 UT/250ML-% IV SOLN
500.0000 [IU]/h | INTRAVENOUS | Status: DC
  Administered 2021-01-30: 18:00:00 500 [IU]/h via INTRAVENOUS

## 2021-01-30 MED ORDER — SUGAMMADEX SODIUM 200 MG/2ML IV SOLN
INTRAVENOUS | Status: DC | PRN
  Administered 2021-01-30: 200 mg via INTRAVENOUS

## 2021-01-30 MED ORDER — GLYCOPYRROLATE PF 0.2 MG/ML IJ SOSY
PREFILLED_SYRINGE | INTRAMUSCULAR | Status: DC | PRN
  Administered 2021-01-30: .1 mg via INTRAVENOUS

## 2021-01-30 MED ORDER — NITROGLYCERIN 1 MG/10 ML FOR IR/CATH LAB
INTRA_ARTERIAL | Status: AC
  Filled 2021-01-30: qty 10

## 2021-01-30 MED ORDER — IOHEXOL 300 MG/ML  SOLN
100.0000 mL | Freq: Once | INTRAMUSCULAR | Status: AC | PRN
  Administered 2021-01-30: 17:00:00 60 mL via INTRAVENOUS

## 2021-01-30 MED ORDER — HEPARIN SODIUM (PORCINE) 1000 UNIT/ML IJ SOLN
INTRAMUSCULAR | Status: DC | PRN
  Administered 2021-01-30: 3000 [IU] via INTRAVENOUS

## 2021-01-30 MED ORDER — CEFAZOLIN SODIUM-DEXTROSE 2-4 GM/100ML-% IV SOLN
INTRAVENOUS | Status: AC
  Filled 2021-01-30: qty 100

## 2021-01-30 MED ORDER — TICAGRELOR 90 MG PO TABS
90.0000 mg | ORAL_TABLET | ORAL | Status: AC
  Administered 2021-01-30: 13:00:00 90 mg via ORAL

## 2021-01-30 MED ORDER — ASPIRIN 81 MG PO CHEW
81.0000 mg | CHEWABLE_TABLET | Freq: Every day | ORAL | Status: DC
  Filled 2021-01-30 (×3): qty 1

## 2021-01-30 MED ORDER — ONDANSETRON HCL 4 MG/2ML IJ SOLN: 4.0000 mg | Freq: Once | INTRAMUSCULAR | Status: DC | PRN

## 2021-01-30 MED ORDER — ONDANSETRON HCL 4 MG/2ML IJ SOLN
INTRAMUSCULAR | Status: DC | PRN
  Administered 2021-01-30: 4 mg via INTRAVENOUS

## 2021-01-30 MED ORDER — EPHEDRINE SULFATE-NACL 50-0.9 MG/10ML-% IV SOSY
PREFILLED_SYRINGE | INTRAVENOUS | Status: DC | PRN
  Administered 2021-01-30: 2.5 mg via INTRAVENOUS
  Administered 2021-01-30: 5 mg via INTRAVENOUS

## 2021-01-30 MED ORDER — FENTANYL CITRATE (PF) 250 MCG/5ML IJ SOLN
INTRAMUSCULAR | Status: DC | PRN
  Administered 2021-01-30 (×2): 50 ug via INTRAVENOUS

## 2021-01-30 MED ORDER — EPTIFIBATIDE 20 MG/10ML IV SOLN
INTRAVENOUS | Status: DC | PRN
  Administered 2021-01-30: 1.5 mg via INTRAVENOUS
  Administered 2021-01-30: 1.5 mg via INTRA_ARTERIAL

## 2021-01-30 MED ORDER — DEXTROSE 50 % IV SOLN: 25.0000 mL | Freq: Once | INTRAVENOUS | Status: AC

## 2021-01-30 MED ORDER — LIDOCAINE HCL 1 % IJ SOLN
INTRAMUSCULAR | Status: AC
  Administered 2021-01-30: 15:00:00 5 mL
  Filled 2021-01-30: qty 20

## 2021-01-30 MED ORDER — ACETAMINOPHEN-CODEINE #3 300-30 MG PO TABS
1.0000 | ORAL_TABLET | Freq: Once | ORAL | Status: AC
  Administered 2021-01-30: 21:00:00 1 via ORAL
  Filled 2021-01-30: qty 1

## 2021-01-30 MED ORDER — PROPOFOL 10 MG/ML IV BOLUS
INTRAVENOUS | Status: DC | PRN
  Administered 2021-01-30: 150 mg via INTRAVENOUS

## 2021-01-30 MED ORDER — ORAL CARE MOUTH RINSE: 15.0000 mL | Freq: Once | OROMUCOSAL | Status: AC

## 2021-01-30 MED ORDER — CHLORHEXIDINE GLUCONATE 0.12 % MT SOLN: 15.0000 mL | Freq: Once | OROMUCOSAL | Status: AC

## 2021-01-30 MED ORDER — CHLORHEXIDINE GLUCONATE CLOTH 2 % EX PADS
6.0000 | MEDICATED_PAD | Freq: Every day | CUTANEOUS | Status: DC
  Administered 2021-01-30 – 2021-02-03 (×5): 6 via TOPICAL

## 2021-01-30 MED ORDER — ASPIRIN 81 MG PO CHEW
81.0000 mg | CHEWABLE_TABLET | Freq: Every day | ORAL | Status: DC
  Administered 2021-01-31 – 2021-02-04 (×5): 81 mg via ORAL
  Filled 2021-01-30 (×4): qty 1

## 2021-01-30 MED ORDER — ACETAMINOPHEN 325 MG PO TABS: 650.0000 mg | ORAL_TABLET | ORAL | Status: DC | PRN

## 2021-01-30 MED ORDER — HEPARIN (PORCINE) 25000 UT/250ML-% IV SOLN
INTRAVENOUS | Status: AC
  Administered 2021-01-30: 19:00:00 500 [IU]/h via INTRAVENOUS
  Filled 2021-01-30: qty 250

## 2021-01-30 MED ORDER — CEFAZOLIN SODIUM-DEXTROSE 2-4 GM/100ML-% IV SOLN
2.0000 g | INTRAVENOUS | Status: AC
  Administered 2021-01-30: 15:00:00 2 g via INTRAVENOUS
  Filled 2021-01-30: qty 100

## 2021-01-30 MED ORDER — LIDOCAINE 2% (20 MG/ML) 5 ML SYRINGE
INTRAMUSCULAR | Status: DC | PRN
  Administered 2021-01-30: 100 mg via INTRAVENOUS

## 2021-01-30 MED ORDER — OXYCODONE HCL 5 MG/5ML PO SOLN: 5.0000 mg | Freq: Once | ORAL | Status: DC | PRN

## 2021-01-30 MED ORDER — CHLORHEXIDINE GLUCONATE 0.12 % MT SOLN
OROMUCOSAL | Status: AC
  Administered 2021-01-30: 13:00:00 15 mL via OROMUCOSAL
  Filled 2021-01-30: qty 15

## 2021-01-30 MED ORDER — ACETAMINOPHEN 160 MG/5ML PO SOLN: 650.0000 mg | ORAL | Status: DC | PRN

## 2021-01-30 MED ORDER — HEPARIN (PORCINE) 25000 UT/250ML-% IV SOLN
500.0000 [IU]/h | INTRAVENOUS | Status: DC
  Filled 2021-01-30: qty 250

## 2021-01-30 MED ORDER — DEXTROSE 50 % IV SOLN: 12.5000 g | Freq: Once | INTRAVENOUS | Status: DC

## 2021-01-30 MED ORDER — CLEVIDIPINE BUTYRATE 0.5 MG/ML IV EMUL
INTRAVENOUS | Status: DC | PRN
  Administered 2021-01-30: 2 mg/h via INTRAVENOUS

## 2021-01-30 MED ORDER — CLEVIDIPINE BUTYRATE 0.5 MG/ML IV EMUL
INTRAVENOUS | Status: AC
  Administered 2021-01-30: 19:00:00 8 mg/h via INTRAVENOUS
  Filled 2021-01-30: qty 50

## 2021-01-30 MED ORDER — CLEVIDIPINE BUTYRATE 0.5 MG/ML IV EMUL
INTRAVENOUS | Status: AC
  Filled 2021-01-30: qty 50

## 2021-01-30 MED ORDER — CLEVIDIPINE BUTYRATE 0.5 MG/ML IV EMUL
0.0000 mg/h | INTRAVENOUS | Status: AC
  Administered 2021-01-31: 10 mg/h via INTRAVENOUS
  Administered 2021-01-31: 05:00:00 8 mg/h via INTRAVENOUS
  Filled 2021-01-30 (×2): qty 50

## 2021-01-30 MED ORDER — ROCURONIUM BROMIDE 10 MG/ML (PF) SYRINGE
PREFILLED_SYRINGE | INTRAVENOUS | Status: DC | PRN
  Administered 2021-01-30: 80 mg via INTRAVENOUS
  Administered 2021-01-30: 20 mg via INTRAVENOUS

## 2021-01-30 MED ORDER — FENTANYL CITRATE (PF) 100 MCG/2ML IJ SOLN
INTRAMUSCULAR | Status: AC
  Filled 2021-01-30: qty 2

## 2021-01-30 MED ORDER — ACETAMINOPHEN 650 MG RE SUPP: 650.0000 mg | RECTAL | Status: DC | PRN

## 2021-01-30 MED ORDER — FENTANYL CITRATE (PF) 100 MCG/2ML IJ SOLN
25.0000 ug | INTRAMUSCULAR | Status: DC | PRN
  Administered 2021-01-30: 18:00:00 25 ug via INTRAVENOUS

## 2021-01-30 MED ORDER — TICAGRELOR 90 MG PO TABS
90.0000 mg | ORAL_TABLET | Freq: Two times a day (BID) | ORAL | Status: DC
  Administered 2021-01-30 – 2021-02-04 (×10): 90 mg via ORAL
  Filled 2021-01-30 (×9): qty 1

## 2021-01-30 MED ORDER — CHLORHEXIDINE GLUCONATE 0.12 % MT SOLN
OROMUCOSAL | Status: AC
  Filled 2021-01-30: qty 15

## 2021-01-30 MED ORDER — EPTIFIBATIDE 20 MG/10ML IV SOLN
INTRAVENOUS | Status: AC
  Filled 2021-01-30: qty 10

## 2021-01-30 MED ORDER — OXYCODONE HCL 5 MG PO TABS: 5.0000 mg | ORAL_TABLET | Freq: Once | ORAL | Status: DC | PRN

## 2021-01-30 MED ORDER — PHENYLEPHRINE HCL-NACL 20-0.9 MG/250ML-% IV SOLN
INTRAVENOUS | Status: DC | PRN
  Administered 2021-01-30: 25 ug/min via INTRAVENOUS

## 2021-01-30 MED ORDER — MIDAZOLAM HCL 2 MG/2ML IJ SOLN
INTRAMUSCULAR | Status: DC | PRN
  Administered 2021-01-30: 1 mg via INTRAVENOUS

## 2021-01-30 MED ORDER — IOHEXOL 300 MG/ML  SOLN
100.0000 mL | Freq: Once | INTRAMUSCULAR | Status: AC | PRN
  Administered 2021-01-30: 17:00:00 20 mL via INTRAVENOUS

## 2021-01-30 NOTE — Anesthesia Procedure Notes (Signed)
Arterial Line Insertion Start/End12/28/2022 1:29 PM, 01/30/2021 1:34 PM Performed by: Wilburn Cornelia, CRNA, CRNA  Patient location: Pre-op. Preanesthetic checklist: patient identified, IV checked, site marked, risks and benefits discussed, surgical consent, monitors and equipment checked, pre-op evaluation, timeout performed and anesthesia consent Lidocaine 1% used for infiltration Left, radial was placed Catheter size: 20 G Hand hygiene performed  and maximum sterile barriers used  Allen's test indicative of satisfactory collateral circulation Attempts: 1 Procedure performed without using ultrasound guided technique. Following insertion, dressing applied and Biopatch. Post procedure assessment: normal  Patient tolerated the procedure well with no immediate complications.

## 2021-01-30 NOTE — Progress Notes (Signed)
Pt blood glucose was 68 at 0628. Pt asymptomatic. Pt NPO for procedure , gave half amp of D50. Rechecked BG at 0658 , BG 153. Provider Alcario Drought notified. Will continue to monitor.

## 2021-01-30 NOTE — Progress Notes (Signed)
ANTICOAGULATION CONSULT NOTE - Initial Consult  Pharmacy Consult for IV Heparin Indication:  Post-interventional neuroradiologic procedure  No Known Allergies  Patient Measurements: Height: 5\' 5"  (165.1 cm) Weight: 62.1 kg (137 lb) IBW/kg (Calculated) : 61.5 Heparin Dosing Weight: 62.1 kg  Vital Signs: Temp: 97.8 F (36.6 C) (12/28 1205) Temp Source: Oral (12/28 1205) BP: 156/78 (12/28 1205) Pulse Rate: 68 (12/28 1205)  Labs: Recent Labs    01/27/21 2302 01/28/21 0833 01/28/21 1025 01/30/21 0313  HGB 13.1  --   --  11.8*  HCT 39.7  --   --  34.7*  PLT 339  --   --  296  LABPROT  --   --   --  12.2  INR  --   --   --  0.9  CREATININE 2.03*  --   --  2.60*  TROPONINIHS  --  9 10  --     Estimated Creatinine Clearance: 26.9 mL/min (A) (by C-G formula based on SCr of 2.6 mg/dL (H)).   Medical History: Past Medical History:  Diagnosis Date   Anxiety    Depression    Diabetes mellitus without complication (HCC)    type 2   Dyspnea    inhaler   GERD (gastroesophageal reflux disease)    History of kidney stones    HLD (hyperlipidemia)    Hypertension    Stroke ([place]) 09/2019   left arm and leg weak    Medications:  Scheduled:   [MAR Hold] aspirin EC  81 mg Oral Daily   [MAR Hold] atorvastatin  80 mg Oral Daily   [MAR Hold] carvedilol  12.5 mg Oral BID WC   [MAR Hold] diclofenac Sodium  2 g Topical QID   [MAR Hold] DULoxetine  60 mg Oral Daily   [MAR Hold] enoxaparin (LOVENOX) injection  30 mg Subcutaneous Q24H   eptifibatide       [MAR Hold] famotidine  20 mg Oral Daily   [MAR Hold] hydrALAZINE  50 mg Oral BID   [MAR Hold] insulin aspart  0-9 Units Subcutaneous TID WC   [MAR Hold] losartan  25 mg Oral BID   [MAR Hold] multivitamin with minerals  1 tablet Oral Daily   [MAR Hold] mupirocin ointment  1 application Nasal BID   [MAR Hold] nicotine  21 mg Transdermal Daily   nitroGLYCERIN       [MAR Hold] sodium chloride flush  3 mL Intravenous Q12H   [MAR  Hold] ticagrelor  90 mg Oral BID   [MAR Hold] tiZANidine  2 mg Oral TID   Infusions:   [MAR Hold] sodium chloride     sodium chloride 75 mL/hr at 01/30/21 1423   sodium chloride 10 mL/hr at 01/30/21 1423   clevidipine     heparin     heparin 500 Units/hr (01/30/21 1732)    Assessment: 60 years of age male s/p bilateral common carotid arteriogram  and revascularization of proximal left ICA. Pharmacy consulted for low dose Heparin post procedure.   Goal of Therapy:  Heparin level 0.1 to 0.25 units/ml Monitor platelets by anticoagulation protocol: Yes   Plan:  Continue Heparin at 500 units/ hr (~8 units/kg/hr). Check Heparin level in 8 hours.   Brain Hilts 01/30/2021,5:34 PM

## 2021-01-30 NOTE — H&P (Signed)
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 CEA, CA199, CHROMGRNA in the last 8760 hours.  Assessment and Plan:  Left ICA stenosis: [name], 60 year old male, is scheduled today for an image-guided diagnostic cerebral angiogram with possible intervention, possible placement of a left ICA stent. Procedure scheduled today at 1 pm with general anesthesia. Patient was loaded with Brilinta last night and has received a dose this morning.   Risks and benefits of this procedure were discussed with the patient including, but not limited to bleeding, infection, vascular injury or contrast induced renal failure.  This interventional procedure involves the use of X-rays and because of the nature of the planned procedure, it is possible that we will have prolonged use of X-ray fluoroscopy.  Potential radiation risks to you include (but are not limited to) the following: - A slightly elevated risk for cancer  several years later in life. This risk is typically less than 0.5% percent. This risk is low in comparison to the normal incidence of human cancer, which is 33% for women and 50% for men according to the [place]. - Radiation induced injury can include skin redness, resembling a rash, tissue breakdown / ulcers and hair loss (which can be temporary or permanent).   The likelihood of either of these occurring depends on the difficulty of the procedure and whether you are sensitive to radiation due to previous procedures, disease, or genetic conditions.   IF your procedure requires a prolonged use of radiation, you will be notified and given written instructions for further action.  It is your responsibility to monitor the irradiated area for the 2 weeks following the procedure and to notify your physician if you are concerned that you have suffered a radiation induced injury.    All of the patient's questions were answered, patient is agreeable to proceed. He has been NPO.   Consent signed and in chart.   Thank you for this interesting consult.  I greatly enjoyed meeting Bobby Branch and look forward to participating in their care.  A copy of this report was sent to the requesting provider on this date.  Electronically Signed: Soyla Dryer, AGACNP-BC [phone] 01/30/2021, 10:30 AM   I spent a total of 20 Minutes    in face to face in clinical consultation, greater than 50% of which was counseling/coordinating care for diagnostic cerebral angiogram with possible intervention,  possible left ICA stent placement.

## 2021-01-30 NOTE — Sedation Documentation (Signed)
Right groin sheath pulled at 1640, manual pressure being held at right femoral artery by IR tech.

## 2021-01-30 NOTE — Anesthesia Procedure Notes (Signed)
Procedure Name: Intubation Date/Time: 01/30/2021 2:40 PM Performed by: Carolan Clines, CRNA Pre-anesthesia Checklist: Patient identified, Emergency Drugs available, Suction available and Patient being monitored Patient Re-evaluated:Patient Re-evaluated prior to induction Oxygen Delivery Method: Circle System Utilized Preoxygenation: Pre-oxygenation with 100% oxygen Induction Type: IV induction Ventilation: Mask ventilation without difficulty Laryngoscope Size: Mac and 4 Grade View: Grade III Tube type: Oral Tube size: 7.5 mm Number of attempts: 1 Airway Equipment and Method: Bougie stylet Placement Confirmation: ETT inserted through vocal cords under direct vision, positive ETCO2 and breath sounds checked- equal and bilateral Secured at: 24 cm Tube secured with: Tape Dental Injury: Teeth and Oropharynx as per pre-operative assessment  Difficulty Due To: Difficult Airway- due to anterior larynx Future Recommendations: Recommend- induction with short-acting agent, and alternative techniques readily available

## 2021-01-30 NOTE — Progress Notes (Signed)
Orthopedic Tech Progress Note Patient Details:  Bobby Branch [birth year] [id number]  IR RN called requesting for a KNEE IMMOBILIZER and she stated " she would apply once they had finished with procedure   Ortho Devices Type of Ortho Device: Knee Immobilizer Ortho Device/Splint Location: RLE Ortho Device/Splint Interventions: Ordered, Other (comment)   Post Interventions Patient Tolerated: Other (comment) Instructions Provided: Care of Agency 01/30/2021, 5:33 PM

## 2021-01-30 NOTE — Sedation Documentation (Addendum)
Patient transported to PACU, bedside report given to [place]. Groin site assessed. Clean, dry and intact. Soft to palpation, no hematoma noted. Distal pulses intact +2. Knee immobilizer in place.

## 2021-01-30 NOTE — Anesthesia Postprocedure Evaluation (Signed)
Anesthesia Post Note  Patient: Bobby Branch  Procedure(s) Performed: IR WITH ANESTHESIA LEFT CAROTID STENT (Left)     Patient location during evaluation: PACU Anesthesia Type: General Level of consciousness: awake and alert Pain management: pain level controlled Vital Signs Assessment: post-procedure vital signs reviewed and stable Respiratory status: spontaneous breathing, nonlabored ventilation, respiratory function stable and patient connected to nasal cannula oxygen Cardiovascular status: blood pressure returned to baseline and stable Postop Assessment: no apparent nausea or vomiting Anesthetic complications: no   No notable events documented.  Last Vitals:  Vitals:   01/30/21 1205 01/30/21 1730  BP: (!) 156/78 (!) 144/79  Pulse: 68 95  Resp: 18 18  Temp: 36.6 C (!) 36.3 C  SpO2: 99% 99%    Last Pain:  Vitals:   01/30/21 1238  TempSrc:   PainSc: 7                  Avalynn Bowe S

## 2021-01-30 NOTE — Progress Notes (Signed)
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  MRI of the brain showed no acute abnormalities, chronic infarction right MCA and ACA territory.  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  He apparently ran out of Plavix about a month ago.  Neurology consulted, started on aspirin and Plavix  Subjective / 24h Interval events: Complains of left shoulder pain, chronic.  Asks for Tylenol.  Denies any new weaknesses  Assessment & Plan: Principal Problem TIA - presented with right-sided scalp tingling, patient with history of previous CVA.  Neurology consulted and following.  Imaging on admission was negative for acute CVA.  CT angiogram showed severe stenosis of left ICA as well as right carotid artery stenting with intra stent thrombus occupying 50% of the lumen.  2D echo showed EF 60 to 65%, grade 1 DD.  LDL is 114.  A1c 6.5.  Currently on Brilinta and aspirin cerebral angiogram with potential intervention scheduled by neuro IR for today.  Active Problems Bilateral carotid stenosis status post right carotid stenting -as above   Hypertension-continue Coreg, losartan, hydralazine.  Overall stable  Diabetes mellitus type 2 with hyperglycemia and hypoglycemia -A1c 6.5.  Continue CBGs with SSI  Hyperlipidemia -LDL 114.  Continue Lipitor  CKD stage IIIb -Baseline creatinine 2-2.4.  Currently at baseline.  Repeat a.m. labs   History of polysubstance abuse -Denies recent cocaine use.  Urine drug screen negative   Depression -Continue Cymbalta  Scheduled Meds:  aspirin EC  81 mg  Oral Daily   atorvastatin  80 mg Oral Daily   carvedilol  12.5 mg Oral BID WC   diclofenac Sodium  2 g Topical QID   DULoxetine  60 mg Oral Daily   enoxaparin (LOVENOX) injection  30 mg Subcutaneous Q24H   famotidine  20 mg Oral Daily   hydrALAZINE  50 mg Oral BID   insulin aspart  0-9 Units Subcutaneous TID WC   losartan  25 mg Oral BID   multivitamin with minerals  1 tablet Oral Daily   mupirocin ointment  1 application Nasal BID   nicotine  21 mg Transdermal Daily   sodium chloride flush  3 mL Intravenous Q12H   ticagrelor  90 mg Oral BID   tiZANidine  2 mg Oral TID   Continuous Infusions:  sodium chloride     sodium chloride 75 mL/hr at 01/30/21 0824    ceFAZolin (ANCEF) IV     PRN Meds:.sodium chloride, acetaminophen **OR** acetaminophen (TYLENOL) oral liquid 160 mg/5 mL **OR** acetaminophen, albuterol, hydrALAZINE, polyethylene glycol, senna-docusate, sodium chloride flush  Diet Orders (From admission, onward)     Start     Ordered   01/31/21 0000  Diet NPO time specified Except for: Sips with Meds  Diet effective midnight       Question:  Except for  Answer:  Sips with Meds   01/30/21 0654   01/30/21 0001  Diet NPO time specified Except for: Ice Chips, Sips with Meds  Diet effective midnight       Question Answer Comment  Except for Ice Chips   Except for Sips with Meds  01/29/21 1419            DVT prophylaxis: enoxaparin (LOVENOX) injection 30 mg Start: 01/29/21 1830 SCD's Start: 01/28/21 1401     Code Status: Full Code  Family Communication: no family at bedside   Status is: Inpatient  Remains inpatient appropriate because: cerebral angiogram today   Level of care: Progressive  Consultants:  Neurology   Procedures:  none  Microbiology  none  Antimicrobials: none    Objective: Vitals:   01/29/21 2128 01/30/21 0018 01/30/21 0429 01/30/21 0747  BP: (!) 159/87 (!) 165/85 (!) 158/74 (!) 154/83  Pulse: 71 71 62 67  Resp:  12 18 18    Temp:  98.3 F (36.8 C) 98.4 F (36.9 C) 98 F (36.7 C)  TempSrc:  Oral Oral Oral  SpO2: 100% 99% 98% 97%  Weight:      Height:        Intake/Output Summary (Last 24 hours) at 01/30/2021 1143 Last data filed at 01/30/2021 0424 Gross per 24 hour  Intake 100 ml  Output 800 ml  Net -700 ml   Filed Weights   01/27/21 2214  Weight: 62.1 kg    Examination:  Constitutional: NAD Eyes: no scleral icterus ENMT: Mucous membranes are moist.  Neck: normal, supple Respiratory: clear to auscultation bilaterally, no wheezing, no crackles.  Cardiovascular: Regular rate and rhythm, no murmurs / rubs / gallops. No LE edema.  Abdomen: non distended, no tenderness. Bowel sounds positive.  Musculoskeletal: no clubbing / cyanosis.  Skin: no rashes Neurologic: chronic left sided deficits, no new focal deficits  Data Reviewed: I have independently reviewed following labs and imaging studies   CBC: Recent Labs  Lab 01/27/21 2302 01/30/21 0313  WBC 8.0 6.1  NEUTROABS 5.0 3.2  HGB 13.1 11.8*  HCT 39.7 34.7*  MCV 95.7 91.6  PLT 339 633   Basic Metabolic Panel: Recent Labs  Lab 01/27/21 2302 01/30/21 0313  NA 139 136  K 4.3 4.1  CL 107 104  CO2 27 23  GLUCOSE 74 74  BUN 19 28*  CREATININE 2.03* 2.60*  CALCIUM 8.9 8.5*  MG  --  2.0   Liver Function Tests: Recent Labs  Lab 01/27/21 2302  AST 23  ALT 22  ALKPHOS 71  BILITOT 0.5  PROT 6.8  ALBUMIN 3.3*   Coagulation Profile: Recent Labs  Lab 01/30/21 0313  INR 0.9   HbA1C: Recent Labs    01/29/21 0036  HGBA1C 6.5*   CBG: Recent Labs  Lab 01/29/21 1550 01/29/21 2109 01/30/21 0628 01/30/21 0658 01/30/21 0810  GLUCAP 235* 217* 68* 153* 99    Recent Results (from the past 240 hour(s))  Resp Panel by RT-PCR (Flu A&B, Covid) Nasopharyngeal Swab     Status: None   Collection Time: 01/28/21  5:34 PM   Specimen: Nasopharyngeal Swab; Nasopharyngeal(NP) swabs in vial transport medium  Result Value Ref Range  Status   SARS Coronavirus 2 by RT PCR NEGATIVE NEGATIVE Final    Comment: (NOTE) SARS-CoV-2 target nucleic acids are NOT DETECTED.  The SARS-CoV-2 RNA is generally detectable in upper respiratory specimens during the acute phase of infection. The lowest concentration of SARS-CoV-2 viral copies this assay can detect is 138 copies/mL. A negative result does not preclude SARS-Cov-2 infection and should not be used as the sole basis for treatment or other patient management decisions. A negative result may occur with  improper specimen collection/handling, submission of specimen other than nasopharyngeal swab, presence of viral mutation(s) within  the areas targeted by this assay, and inadequate number of viral copies(<138 copies/mL). A negative result must be combined with clinical observations, patient history, and epidemiological information. The expected result is Negative.  Fact Sheet for Patients:  EntrepreneurPulse.com.au  Fact Sheet for Healthcare Providers:  IncredibleEmployment.be  This test is no t yet approved or cleared by the [place] FDA and  has been authorized for detection and/or diagnosis of SARS-CoV-2 by FDA under an Emergency Use Authorization (EUA). This EUA will remain  in effect (meaning this test can be used) for the duration of the COVID-19 declaration under Section 564(b)(1) of the Act, 21 U.S.C.section 360bbb-3(b)(1), unless the authorization is terminated  or revoked sooner.       Influenza A by PCR NEGATIVE NEGATIVE Final   Influenza B by PCR NEGATIVE NEGATIVE Final    Comment: (NOTE) The Xpert Xpress SARS-CoV-2/FLU/RSV plus assay is intended as an aid in the diagnosis of influenza from Nasopharyngeal swab specimens and should not be used as a sole basis for treatment. Nasal washings and aspirates are unacceptable for Xpert Xpress SARS-CoV-2/FLU/RSV testing.  Fact Sheet for  Patients: EntrepreneurPulse.com.au  Fact Sheet for Healthcare Providers: IncredibleEmployment.be  This test is not yet approved or cleared by the [place] FDA and has been authorized for detection and/or diagnosis of SARS-CoV-2 by FDA under an Emergency Use Authorization (EUA). This EUA will remain in effect (meaning this test can be used) for the duration of the COVID-19 declaration under Section 564(b)(1) of the Act, 21 U.S.C. section 360bbb-3(b)(1), unless the authorization is terminated or revoked.  Performed at [facility], [place] [address] [place], [place] [address]   Surgical PCR screen     Status: Abnormal   Collection Time: 01/29/21  3:32 AM   Specimen: Nasal Mucosa; Nasal Swab  Result Value Ref Range Status   MRSA, PCR NEGATIVE NEGATIVE Final   Staphylococcus aureus POSITIVE (A) NEGATIVE Final    Comment: (NOTE) The Xpert SA Assay (FDA approved for NASAL specimens in patients 21 years of age and older), is one component of a comprehensive surveillance program. It is not intended to diagnose infection nor to guide or monitor treatment. Performed at [facility], [place] [address] [place], [place] [address]      Radiology Studies: ECHOCARDIOGRAM COMPLETE  Result Date: 01/29/2021    ECHOCARDIOGRAM REPORT   Patient Name:   Bobby Branch Date of Exam: 01/29/2021 Medical Rec #:  [id number]    Height:       65.0 in Accession #:    [id number]   Weight:       137.0 lb Date of Birth:  1960/09/17    BSA:          1.684 m Patient Age:    51 years     BP:           148/94 mmHg Patient Gender: M            HR:           81 bpm. Exam Location:  Inpatient Procedure: 2D Echo, Cardiac Doppler, Color Doppler, 3D Echo and Strain Analysis Indications:    Stroke  History:        Patient has prior history of Echocardiogram examinations, most                 recent 04/20/2020. Risk Factors:Hypertension and Diabetes.  Sonographer:    Glo Herring Referring Phys: [id number] [place]  1. Left ventricular  ejection fraction, by estimation, is 60 to 65%. The left ventricle has normal function. The left ventricle has no regional wall motion abnormalities. There is mild left ventricular hypertrophy. Left ventricular diastolic parameters are consistent with Grade I diastolic dysfunction (impaired relaxation). The average left ventricular global longitudinal strain is -17.1 %. The global longitudinal strain is normal.  2. Right ventricular systolic function is normal. The right ventricular size is normal.  3. The mitral valve is normal in structure. Trivial mitral valve regurgitation. No evidence of mitral stenosis.  4. The aortic valve is tricuspid. Aortic valve regurgitation is not visualized. Aortic valve sclerosis/calcification is present, without any evidence of aortic stenosis.  5. The inferior vena cava is normal in size with greater than 50% respiratory variability, suggesting right atrial pressure of 3 mmHg. FINDINGS  Left Ventricle: Left ventricular ejection fraction, by estimation, is 60 to 65%. The left ventricle has normal function. The left ventricle has no regional wall motion abnormalities. The average left ventricular global longitudinal strain is -17.1 %. The global longitudinal strain is normal. The left ventricular internal cavity size was normal in size. There is mild left ventricular hypertrophy. Left ventricular diastolic parameters are consistent with Grade I diastolic dysfunction (impaired relaxation). Right Ventricle: The right ventricular size is normal. No increase in right ventricular wall thickness. Right ventricular systolic function is normal. Left Atrium: Left atrial size was normal in size. Right Atrium: Right atrial size was normal in size. Pericardium: There is no evidence of pericardial effusion. Mitral Valve: The mitral valve is normal in structure. There is mild thickening of the mitral valve leaflet(s).  Trivial mitral valve regurgitation. No evidence of mitral valve stenosis. Tricuspid Valve: The tricuspid valve is normal in structure. Tricuspid valve regurgitation is not demonstrated. No evidence of tricuspid stenosis. Aortic Valve: The aortic valve is tricuspid. Aortic valve regurgitation is not visualized. Aortic valve sclerosis/calcification is present, without any evidence of aortic stenosis. Aortic valve mean gradient measures 3.0 mmHg. Aortic valve peak gradient measures 5.4 mmHg. Aortic valve area, by VTI measures 2.81 cm. Pulmonic Valve: The pulmonic valve was normal in structure. Pulmonic valve regurgitation is not visualized. No evidence of pulmonic stenosis. Aorta: The aortic root is normal in size and structure. Venous: The inferior vena cava is normal in size with greater than 50% respiratory variability, suggesting right atrial pressure of 3 mmHg. IAS/Shunts: No atrial level shunt detected by color flow Doppler.  LEFT VENTRICLE PLAX 2D LVIDd:         4.20 cm   Diastology LVIDs:         3.00 cm   LV e' medial:    5.22 cm/s LV PW:         1.20 cm   LV E/e' medial:  10.4 LV IVS:        1.20 cm   LV e' lateral:   7.51 cm/s LVOT diam:     2.00 cm   LV E/e' lateral: 7.2 LV SV:         67 LV SV Index:   40        2D Longitudinal Strain LVOT Area:     3.14 cm  2D Strain GLS Avg:     -17.1 %  RIGHT VENTRICLE             IVC RV Basal diam:  3.70 cm     IVC diam: 1.30 cm RV Mid diam:    2.80 cm RV S prime:     11.60  cm/s LEFT ATRIUM             Index        RIGHT ATRIUM           Index LA diam:        3.40 cm 2.02 cm/m   RA Area:     13.70 cm LA Vol (A2C):   35.2 ml 20.90 ml/m  RA Volume:   32.30 ml  19.18 ml/m LA Vol (A4C):   35.0 ml 20.78 ml/m LA Biplane Vol: 36.8 ml 21.85 ml/m  AORTIC VALVE                    PULMONIC VALVE AV Area (Vmax):    2.95 cm     PV Vmax:       1.17 m/s AV Area (Vmean):   2.57 cm     PV Peak grad:  5.5 mmHg AV Area (VTI):     2.81 cm AV Vmax:           116.00 cm/s AV  Vmean:          82.600 cm/s AV VTI:            0.237 m AV Peak Grad:      5.4 mmHg AV Mean Grad:      3.0 mmHg LVOT Vmax:         109.00 cm/s LVOT Vmean:        67.600 cm/s LVOT VTI:          0.212 m LVOT/AV VTI ratio: 0.89  AORTA Ao Root diam: 2.90 cm MITRAL VALVE MV Area (PHT): 5.09 cm    SHUNTS MV Decel Time: 149 msec    Systemic VTI:  0.21 m MV E velocity: 54.20 cm/s  Systemic Diam: 2.00 cm MV A velocity: 95.70 cm/s MV E/A ratio:  0.57 Jenkins Rouge MD Electronically signed by Jenkins Rouge MD Signature Date/Time: 01/29/2021/3:34:05 PM    Final       Marzetta Board, MD, PhD [facility]  Between 7 am - 7 pm I am available, please contact me via Amion (for emergencies) or Securechat (non urgent messages)  Between 7 pm - 7 am I am not available, please contact night coverage MD/APP via Amion

## 2021-01-30 NOTE — Anesthesia Preprocedure Evaluation (Signed)
Anesthesia Evaluation  Patient identified by MRN, date of birth, ID band Patient awake    Reviewed: Allergy & Precautions, NPO status , Patient's Chart, lab work & pertinent test results  Airway Mallampati: II  TM Distance: >3 FB Neck ROM: Full    Dental no notable dental hx.    Pulmonary Current Smoker and Patient abstained from smoking.,    Pulmonary exam normal breath sounds clear to auscultation       Cardiovascular hypertension, Pt. on medications Normal cardiovascular exam Rhythm:Regular Rate:Normal     Neuro/Psych Right middle cerebral artery stroke  CVA, Residual Symptoms negative psych ROS   GI/Hepatic GERD  ,(+)     substance abuse  cocaine use,   Endo/Other  diabetes, Type 2  Renal/GU Renal InsufficiencyRenal disease  negative genitourinary   Musculoskeletal negative musculoskeletal ROS (+)   Abdominal   Peds negative pediatric ROS (+)  Hematology negative hematology ROS (+)   Anesthesia Other Findings   Reproductive/Obstetrics negative OB ROS                             Anesthesia Physical Anesthesia Plan  ASA: 4  Anesthesia Plan: General   Post-op Pain Management: Minimal or no pain anticipated   Induction: Intravenous  PONV Risk Score and Plan: 1 and Ondansetron and Treatment may vary due to age or medical condition  Airway Management Planned: Oral ETT  Additional Equipment:   Intra-op Plan:   Post-operative Plan: Extubation in OR  Informed Consent: I have reviewed the patients History and Physical, chart, labs and discussed the procedure including the risks, benefits and alternatives for the proposed anesthesia with the patient or authorized representative who has indicated his/her understanding and acceptance.     Dental advisory given  Plan Discussed with: CRNA and Surgeon  Anesthesia Plan Comments: (Possible arterial line if necessary)         Anesthesia Quick Evaluation

## 2021-01-30 NOTE — Procedures (Signed)
INR . S/P bilateral common carotid arteriogram RT CFA approach. General anesthesia. Findings. 1.Patent RT ICA stent . 2.Severe stenosis  of LT ICA prox  associated with mod  ulceration. Revascularization of prox Lt ICA with distal protection. Post CT brain No ICH  Manual compression RT CFA puncture site with quick clot for 20 min. Distal pulses  palpable bilaterally. Extubated. Denies any H/As,N/ V . Pupils RT 50mm  ,Lt 3 MM  sluggish. Lt facial droop. No change in Lt sided weakness arm> lleg. S.Lori-Ann Lindfors MD

## 2021-01-30 NOTE — Transfer of Care (Signed)
Immediate Anesthesia Transfer of Care Note  Patient: Bobby Branch  Procedure(s) Performed: IR WITH ANESTHESIA LEFT CAROTID STENT (Left)  Patient Location: PACU  Anesthesia Type:General  Level of Consciousness: drowsy  Airway & Oxygen Therapy: Patient Spontanous Breathing  Post-op Assessment: Report given to RN and Post -op Vital signs reviewed and stable  Post vital signs: Reviewed and stable  Last Vitals:  Vitals Value Taken Time  BP 144/79 01/30/21 1727  Temp    Pulse 95 01/30/21 1729  Resp 16 01/30/21 1729  SpO2 99 % 01/30/21 1729  Vitals shown include unvalidated device data.  Last Pain:  Vitals:   01/30/21 1238  TempSrc:   PainSc: 7       Patients Stated Pain Goal: 0 (52/17/47 1595)  Complications: No notable events documented.

## 2021-01-31 ENCOUNTER — Encounter (HOSPITAL_COMMUNITY): Payer: Self-pay | Admitting: Interventional Radiology

## 2021-01-31 LAB — BASIC METABOLIC PANEL
Anion gap: 5 (ref 5–15)
BUN: 24 mg/dL — ABNORMAL HIGH (ref 6–20)
CO2: 20 mmol/L — ABNORMAL LOW (ref 22–32)
Calcium: 7.8 mg/dL — ABNORMAL LOW (ref 8.9–10.3)
Chloride: 107 mmol/L (ref 98–111)
Creatinine, Ser: 2.4 mg/dL — ABNORMAL HIGH (ref 0.61–1.24)
GFR, Estimated: 31 mL/min — ABNORMAL LOW (ref >60)
Glucose, Bld: 197 mg/dL — ABNORMAL HIGH (ref 70–99)
Potassium: 4.4 mmol/L (ref 3.5–5.1)
Sodium: 132 mmol/L — ABNORMAL LOW (ref 135–145)

## 2021-01-31 LAB — CBC WITH DIFFERENTIAL/PLATELET
Abs Immature Granulocytes: 0.05 10*3/uL (ref 0.00–0.07)
Basophils Absolute: 0 10*3/uL (ref 0.0–0.1)
Basophils Relative: 0 %
Eosinophils Absolute: 0.2 10*3/uL (ref 0.0–0.5)
Eosinophils Relative: 1 %
HCT: 32.1 % — ABNORMAL LOW (ref 39.0–52.0)
Hemoglobin: 10.6 g/dL — ABNORMAL LOW (ref 13.0–17.0)
Immature Granulocytes: 0 %
Lymphocytes Relative: 7 %
Lymphs Abs: 1 10*3/uL (ref 0.7–4.0)
MCH: 31.2 pg (ref 26.0–34.0)
MCHC: 33 g/dL (ref 30.0–36.0)
MCV: 94.4 fL (ref 80.0–100.0)
Monocytes Absolute: 0.7 10*3/uL (ref 0.1–1.0)
Monocytes Relative: 5 %
Neutro Abs: 12.5 10*3/uL — ABNORMAL HIGH (ref 1.7–7.7)
Neutrophils Relative %: 87 %
Platelets: 273 10*3/uL (ref 150–400)
RBC: 3.4 MIL/uL — ABNORMAL LOW (ref 4.22–5.81)
RDW: 13.7 % (ref 11.5–15.5)
WBC: 14.3 10*3/uL — ABNORMAL HIGH (ref 4.0–10.5)
nRBC: 0 % (ref 0.0–0.2)

## 2021-01-31 LAB — GLUCOSE, CAPILLARY
Glucose-Capillary: 168 mg/dL — ABNORMAL HIGH (ref 70–99)
Glucose-Capillary: 131 mg/dL — ABNORMAL HIGH (ref 70–99)
Glucose-Capillary: 136 mg/dL — ABNORMAL HIGH (ref 70–99)
Glucose-Capillary: 164 mg/dL — ABNORMAL HIGH (ref 70–99)

## 2021-01-31 LAB — HEPARIN LEVEL (UNFRACTIONATED): Heparin Unfractionated: 0.15 IU/mL — ABNORMAL LOW (ref 0.30–0.70)

## 2021-01-31 NOTE — Progress Notes (Signed)
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  MRI of the brain showed no acute abnormalities, chronic infarction right MCA and ACA territory.  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  He apparently ran out of Plavix about a month ago.  Neurology consulted, started on aspirin and Plavix  Subjective / 24h Interval events: Feeling well this morning, no significant complaints.  Assessment & Plan: Principal Problem TIA - presented with right-sided scalp tingling, patient with history of previous CVA.  Neurology consulted and following.  Imaging on admission was negative for acute CVA.  CT angiogram showed severe stenosis of left ICA as well as right carotid artery stenting with intra stent thrombus occupying 50% of the lumen.  2D echo showed EF 60 to 65%, grade 1 DD.  LDL is 114.  A1c 6.5.  Currently on Brilinta and aspirin.  Underwent cerebral angiogram yesterday status post left ICA stent placement.  He already had a right ICA stented in May.  Discussed extensively with the patient at bedside, he cannot, and should not discontinue antiplatelet agents again in the future  Active Problems Bilateral carotid stenosis status post right carotid stenting -as above  Hypertension-continue Coreg, losartan, hydralazine.  Overall stable  Diabetes mellitus type 2 with hyperglycemia and hypoglycemia -A1c 6.5.  Continue CBGs with SSI  Hyperlipidemia -LDL 114.  Continue Lipitor  CKD stage IIIb -Baseline creatinine 2-2.4.  Currently at baseline   History of polysubstance abuse -Denies  recent cocaine use.  Urine drug screen negative   Depression -Continue Cymbalta  Scheduled Meds:  aspirin  81 mg Oral Daily   Or   aspirin  81 mg Per Tube Daily   atorvastatin  80 mg Oral Daily   carvedilol  12.5 mg Oral BID WC   Chlorhexidine Gluconate Cloth  6 each Topical Q0600   diclofenac Sodium  2 g Topical QID   DULoxetine  60 mg Oral Daily   enoxaparin (LOVENOX) injection  30 mg Subcutaneous Q24H   famotidine  20 mg Oral Daily   fentaNYL (SUBLIMAZE) injection  12.5 mcg Intravenous Once   hydrALAZINE  50 mg Oral BID   insulin aspart  0-9 Units Subcutaneous TID WC   losartan  25 mg Oral BID   multivitamin with minerals  1 tablet Oral Daily   mupirocin ointment  1 application Nasal BID   nicotine  21 mg Transdermal Daily   sodium chloride flush  3 mL Intravenous Q12H   ticagrelor  90 mg Oral BID   Or   ticagrelor  90 mg Per Tube BID   tiZANidine  2 mg Oral TID   Continuous Infusions:  sodium chloride     sodium chloride 75 mL/hr at 01/31/21 0600   clevidipine 1 mg/hr (01/31/21 0806)   PRN Meds:.sodium chloride, acetaminophen **OR** acetaminophen (TYLENOL) oral liquid 160 mg/5 mL **OR** acetaminophen, albuterol, hydrALAZINE, polyethylene glycol, senna-docusate, sodium chloride flush  Diet Orders (From admission, onward)     Start     Ordered   01/31/21 0615  Diet Carb Modified Fluid consistency: Thin; Room service appropriate? Yes  Diet effective now  Question Answer Comment  Diet-HS Snack? Nothing   Calorie Level Medium 1600-2000   Fluid consistency: Thin   Room service appropriate? Yes      01/31/21 0614            DVT prophylaxis: enoxaparin (LOVENOX) injection 30 mg Start: 01/29/21 1830 SCD's Start: 01/28/21 1401     Code Status: Full Code  Family Communication: no family at bedside   Status is: Inpatient  Remains inpatient appropriate because: Moving out of the ICU today  Level of care: Progressive  Consultants:  Neurology    Procedures:  none  Microbiology  none  Antimicrobials: none    Objective: Vitals:   01/31/21 0400 01/31/21 0500 01/31/21 0600 01/31/21 0800  BP: 118/60 127/72 126/60 124/72  Pulse: 94 97 84 84  Resp: 14 16 17 16   Temp:    99.3 F (37.4 C)  TempSrc:    Axillary  SpO2: 96% 95% 96% 96%  Weight:      Height:        Intake/Output Summary (Last 24 hours) at 01/31/2021 1100 Last data filed at 01/31/2021 0600 Gross per 24 hour  Intake 2335.05 ml  Output 1225 ml  Net 1110.05 ml    Filed Weights   01/27/21 2214  Weight: 62.1 kg    Examination:  Constitutional: No distress Eyes: Anicteric ENMT: Moist mucous membranes Neck: normal, supple Respiratory: Clear bilaterally, no wheezing, no crackles Cardiovascular: Regular rate and rhythm, no murmurs, no peripheral edema Abdomen: Soft, NT, ND, bowel sounds positive Musculoskeletal: no clubbing / cyanosis.  Skin: No rashes Neurologic: chronic left sided deficits, no new focal deficits  Data Reviewed: I have independently reviewed following labs and imaging studies   CBC: Recent Labs  Lab 01/27/21 2302 01/30/21 0313 01/31/21 0115  WBC 8.0 6.1 14.3*  NEUTROABS 5.0 3.2 12.5*  HGB 13.1 11.8* 10.6*  HCT 39.7 34.7* 32.1*  MCV 95.7 91.6 94.4  PLT 339 296 295    Basic Metabolic Panel: Recent Labs  Lab 01/27/21 2302 01/30/21 0313 01/31/21 0115  NA 139 136 132*  K 4.3 4.1 4.4  CL 107 104 107  CO2 27 23 20*  GLUCOSE 74 74 197*  BUN 19 28* 24*  CREATININE 2.03* 2.60* 2.40*  CALCIUM 8.9 8.5* 7.8*  MG  --  2.0  --     Liver Function Tests: Recent Labs  Lab 01/27/21 2302  AST 23  ALT 22  ALKPHOS 71  BILITOT 0.5  PROT 6.8  ALBUMIN 3.3*    Coagulation Profile: Recent Labs  Lab 01/30/21 0313  INR 0.9    HbA1C: Recent Labs    01/29/21 0036  HGBA1C 6.5*    CBG: Recent Labs  Lab 01/30/21 0658 01/30/21 0810 01/30/21 1732 01/30/21 2051 01/31/21 0725  GLUCAP 153* 99 128* 174* 168*      Recent Results (from the past 240 hour(s))  Resp Panel by RT-PCR (Flu A&B, Covid) Nasopharyngeal Swab     Status: None   Collection Time: 01/28/21  5:34 PM   Specimen: Nasopharyngeal Swab; Nasopharyngeal(NP) swabs in vial transport medium  Result Value Ref Range Status   SARS Coronavirus 2 by RT PCR NEGATIVE NEGATIVE Final    Comment: (NOTE) SARS-CoV-2 target nucleic acids are NOT DETECTED.  The SARS-CoV-2 RNA is generally detectable in upper respiratory specimens during the acute phase of infection. The lowest concentration of SARS-CoV-2 viral copies this assay can detect is 138 copies/mL. A negative result does not preclude SARS-Cov-2 infection and should  not be used as the sole basis for treatment or other patient management decisions. A negative result may occur with  improper specimen collection/handling, submission of specimen other than nasopharyngeal swab, presence of viral mutation(s) within the areas targeted by this assay, and inadequate number of viral copies(<138 copies/mL). A negative result must be combined with clinical observations, patient history, and epidemiological information. The expected result is Negative.  Fact Sheet for Patients:  EntrepreneurPulse.com.au  Fact Sheet for Healthcare Providers:  IncredibleEmployment.be  This test is no t yet approved or cleared by the [place] FDA and  has been authorized for detection and/or diagnosis of SARS-CoV-2 by FDA under an Emergency Use Authorization (EUA). This EUA will remain  in effect (meaning this test can be used) for the duration of the COVID-19 declaration under Section 564(b)(1) of the Act, 21 U.S.C.section 360bbb-3(b)(1), unless the authorization is terminated  or revoked sooner.       Influenza A by PCR NEGATIVE NEGATIVE Final   Influenza B by PCR NEGATIVE NEGATIVE Final    Comment: (NOTE) The Xpert Xpress SARS-CoV-2/FLU/RSV plus assay is intended as  an aid in the diagnosis of influenza from Nasopharyngeal swab specimens and should not be used as a sole basis for treatment. Nasal washings and aspirates are unacceptable for Xpert Xpress SARS-CoV-2/FLU/RSV testing.  Fact Sheet for Patients: EntrepreneurPulse.com.au  Fact Sheet for Healthcare Providers: IncredibleEmployment.be  This test is not yet approved or cleared by the [place] FDA and has been authorized for detection and/or diagnosis of SARS-CoV-2 by FDA under an Emergency Use Authorization (EUA). This EUA will remain in effect (meaning this test can be used) for the duration of the COVID-19 declaration under Section 564(b)(1) of the Act, 21 U.S.C. section 360bbb-3(b)(1), unless the authorization is terminated or revoked.  Performed at [facility], [place] [address] [place], [place] [address]   Surgical PCR screen     Status: Abnormal   Collection Time: 01/29/21  3:32 AM   Specimen: Nasal Mucosa; Nasal Swab  Result Value Ref Range Status   MRSA, PCR NEGATIVE NEGATIVE Final   Staphylococcus aureus POSITIVE (A) NEGATIVE Final    Comment: (NOTE) The Xpert SA Assay (FDA approved for NASAL specimens in patients 13 years of age and older), is one component of a comprehensive surveillance program. It is not intended to diagnose infection nor to guide or monitor treatment. Performed at [facility], [place] [address] [place], [place] [address]       Radiology Studies: No results found.    Marzetta Board, MD, PhD [facility]  Between 7 am - 7 pm I am available, please contact me via Amion (for emergencies) or Securechat (non urgent messages)  Between 7 pm - 7 am I am not available, please contact night coverage MD/APP via Amion

## 2021-01-31 NOTE — Progress Notes (Signed)
Referring Physician(s): [facility]/Neurology   Supervising Physician: Luanne Bras  Patient Status:  [facility] - In-pt  Chief Complaint:  Bilateral carotid disease s/p right ICA stent placement with Dr. Earleen Newport on 06/21/20, left ICA stent placement was recommended but patient did not follow up. S/p left ICA stent placement with Dr. Estanislado Pandy on 12/28   Subjective:  Laying in bed, reports chronic left sided HA.  Denies vision changes.   Allergies: Patient has no known allergies.  Medications: Prior to Admission medications   Medication Sig Start Date End Date Taking? Authorizing Provider  acetaminophen (TYLENOL) 325 MG tablet Take 2 tablets (650 mg total) by mouth every 4 (four) hours as needed for mild pain (or temp > 37.5 C (99.5 F)). 05/21/20  Yes Angiulli, Lavon Paganini, PA-C  albuterol (PROAIR HFA) 108 (90 Base) MCG/ACT inhaler Inhale 1 puff into the lungs every 6 (six) hours as needed for wheezing or shortness of breath. 01/10/21  Yes Charlott Rakes, MD  aspirin 325 MG EC tablet Take 325 mg by mouth daily.   Yes [provider]  atorvastatin (LIPITOR) 40 MG tablet Take 1 tablet (40 mg total) by mouth daily. 07/04/20  Yes Freeman Caldron M, PA-C  carvedilol (COREG) 12.5 MG tablet Take 1 tablet (12.5 mg total) by mouth 2 (two) times daily with a meal. 11/22/20  Yes Newlin, Enobong, MD  clopidogrel (PLAVIX) 75 MG tablet Take 1 tablet (75 mg total) by mouth daily. 07/04/20  Yes Freeman Caldron M, PA-C  diclofenac Sodium (VOLTAREN) 1 % GEL Apply 2 grams topically 4 (four) times daily. 10/25/20  Yes Carvel Getting, NP  DULoxetine (CYMBALTA) 60 MG capsule Take 1 capsule (60 mg total) by mouth daily. 11/22/20  Yes Charlott Rakes, MD  famotidine (PEPCID) 20 MG tablet Take 1 tablet (20 mg total) by mouth daily. 07/04/20  Yes Freeman Caldron M, PA-C  glimepiride (AMARYL) 2 MG tablet Take 1.5 tablets (3 mg total) by mouth daily with breakfast. 11/22/20  Yes Newlin, Enobong, MD  hydrALAZINE  (APRESOLINE) 50 MG tablet Take 1 tablet (50 mg total) by mouth 3 (three) times daily. Patient taking differently: Take 50 mg by mouth in the morning and at bedtime. 07/04/20  Yes McClung, Angela M, PA-C  losartan (COZAAR) 25 MG tablet Take 1 tab by mouth daily Patient taking differently: Take 25 mg by mouth in the morning and at bedtime. 01/02/21  Yes   Multiple Vitamin (MULTIVITAMIN WITH MINERALS) TABS tablet Take 1 tablet by mouth daily. 05/22/20  Yes Angiulli, Lavon Paganini, PA-C  tiZANidine (ZANAFLEX) 4 MG tablet TAKE 1 TABLET(2 MG) BY MOUTH THREE TIMES DAILY Patient taking differently: Take 4 mg by mouth 3 (three) times daily. 11/22/20  Yes Charlott Rakes, MD  aspirin 81 MG EC tablet Take 1 tablet (81 mg total) by mouth daily. Patient not taking: Reported on 01/28/2021 06/22/20   Ronney Lion, PA-C  docusate sodium (COLACE) 100 MG capsule Take 1 capsule (100 mg total) by mouth 2 (two) times daily. 05/21/20   Angiulli, Lavon Paganini, PA-C  gabapentin (NEURONTIN) 300 MG capsule Take 1 capsule (300 mg total) by mouth 2 (two) times daily. 10/29/20   Charlott Rakes, MD  lidocaine (LIDODERM) 5 % Place 1 patch onto the skin daily. Remove & Discard patch within 12 hours or as directed by MD Patient not taking: Reported on 01/28/2021 05/21/20   [place], Lavon Paganini, PA-C  Misc. Devices MISC L AFO brace. L leg weakness. 01/07/21   Charlott Rakes, MD  nicotine (NICODERM CQ - DOSED IN MG/24 HR) 7 mg/24hr patch Place 1 patch (7 mg total) onto the skin daily. Patient not taking: Reported on 01/28/2021 05/22/20   Angiulli, Lavon Paganini, PA-C  polyethylene glycol (MIRALAX / GLYCOLAX) 17 g packet Take 17 g by mouth daily. Patient not taking: Reported on 01/28/2021 05/22/20   Cathlyn Parsons, PA-C     Vital Signs: BP 124/72    Pulse 84    Temp 99.3 F (37.4 C) (Axillary)    Resp 16    Ht 5\' 5"  (1.651 m)    Wt 137 lb (62.1 kg)    SpO2 96%    BMI 22.80 kg/m   Physical Exam Vitals reviewed.  Constitutional:       General: He is not in acute distress. HENT:     Head: Normocephalic and atraumatic.  Pulmonary:     Effort: Pulmonary effort is normal.  Abdominal:     General: Abdomen is flat.     Palpations: Abdomen is soft.  Skin:    General: Skin is warm and dry.     Comments: Positive dressing on R CFA puncture site. Site with minimal bleeding from the tract. Otherwise is unremarkable with no erythema, edema, tenderness, bleeding or drainage. Minimal amount of old, dry blood noted on the dressing. Dressing otherwise clean, dry, and intact.   Alert, awake, and oriented x 4 Speech and comprehension intact PERRL  EOMs intact, no nystagmus or subjective diplopia. Right facial droop  Tongue right deviation  Motor power intact on right arm and legs, minimal movement in left arm  No pronator drift. Fine motor and coordination intact  Distal pulses 1+   Neurological:     Mental Status: He is alert and oriented to person, place, and time.  Psychiatric:        Behavior: Behavior normal.    Imaging: CT ANGIO HEAD NECK W WO CM  Result Date: 01/28/2021 CLINICAL DATA:  Neuro deficit, acute, stroke suspected. MRI negative for acute insult. EXAM: CT ANGIOGRAPHY HEAD AND NECK TECHNIQUE: Multidetector CT imaging of the head and neck was performed using the standard protocol during bolus administration of intravenous contrast. Multiplanar CT image reconstructions and MIPs were obtained to evaluate the vascular anatomy. Carotid stenosis measurements (when applicable) are obtained utilizing NASCET criteria, using the distal internal carotid diameter as the denominator. CONTRAST:  78mL OMNIPAQUE IOHEXOL 350 MG/ML SOLN COMPARISON:  MRI same day.  Head CT same day. FINDINGS: CTA NECK FINDINGS Aortic arch: Aortic atherosclerosis. No aneurysm or dissection. Branching pattern is normal without origin stenosis. Right carotid system: Common carotid artery widely patent to the distal extent. Carotid artery stent beginning at  the distal common carotid artery, extending through the ICA bulb into the proximal cervical ICA. There is filling defect within the stent that occupies approximately 50% of the diameter. Patent diameter measures 1.5 mm. Beyond the stent, the cervical ICA is widely patent to the skull base. Left carotid system: Common carotid artery widely patent to the bifurcation. Extensive soft and calcified plaque at the carotid bifurcation and ICA bulb with ulceration. Lumen of the proximal ICA is markedly narrowed, with serial stenoses showing a diameter of 1 mm. Beyond that, the ICA shows a flow reduced diameter but is patent to the skull base. Vertebral arteries: Both vertebral artery origins are widely patent. Both vertebral arteries are patent through the cervical region to the foramen magnum, the left being larger than the right. Skeleton: Ordinary cervical spondylosis. Other neck:  No mass or lymphadenopathy. Upper chest: Lung apices are clear. Review of the MIP images confirms the above findings CTA HEAD FINDINGS Anterior circulation: Both internal carotid arteries are patent through the skull base and siphon regions. There is ordinary siphon atherosclerotic calcification but no stenosis. The anterior and middle cerebral vessels are patent. No large vessel occlusion or correctable proximal stenosis. No aneurysm or vascular malformation. Posterior circulation: Both vertebral arteries are patent through the foramen magnum to the basilar. No basilar stenosis. Posterior circulation branch vessels are patent. There are bilateral patent posterior communicating arteries. Venous sinuses: Patent and normal. Anatomic variants: None significant. Review of the MIP images confirms the above findings IMPRESSION: Aortic atherosclerosis. Carotid artery stent on the right extending from the distal common carotid artery over a length of 4 cm to the cervical ICA beyond the bulb. In the bulb region, there is apparent intra stent thrombus  occupying 50% of the lumen. Patent lumen diameter is 1.5 mm. Compared to a more distal cervical ICA diameter of 5 mm, this indicates a 70% stenosis within the stent. Complex soft and calcified plaque at the left carotid bifurcation and ICA bulb. Areas of ulceration. Serial severe stenoses in the ICA bulb with diameter 1 mm or less. Flow reduced diameter of the more distal cervical ICA. Considerable risk of occlusion or embolic event from this disease. This disease appears quite similar to the previous CT angiogram from 04/20/2020. No intracranial large vessel occlusion or correctable proximal stenosis. Electronically Signed   By: Nelson Chimes M.D.   On: 01/28/2021 11:28   DG Chest 2 View  Result Date: 01/28/2021 CLINICAL DATA:  Chest pain EXAM: CHEST - 2 VIEW COMPARISON:  05/01/2020 FINDINGS: Lungs are clear.  No pleural effusion or pneumothorax. The heart is normal in size.  Thoracic aortic atherosclerosis. Visualized osseous structures are within normal limits. IMPRESSION: Normal chest radiographs. Electronically Signed   By: Julian Hy M.D.   On: 01/28/2021 07:06   CT Head Wo Contrast  Result Date: 01/28/2021 CLINICAL DATA:  Hypertensive emergency EXAM: CT HEAD WITHOUT CONTRAST TECHNIQUE: Contiguous axial images were obtained from the base of the skull through the vertex without intravenous contrast. COMPARISON:  04/20/2020 FINDINGS: Brain: There is no mass, hemorrhage or extra-axial collection. There is generalized atrophy without lobar predilection. Hypodensity of the white matter is most commonly associated with chronic microvascular disease. Old right frontal and parietal infarcts. Old small vessel infarct along the posterior limb of the right internal capsule. Vascular: No abnormal hyperdensity of the major intracranial arteries or dural venous sinuses. No intracranial atherosclerosis. Skull: The visualized skull base, calvarium and extracranial soft tissues are normal. Sinuses/Orbits: No  fluid levels or advanced mucosal thickening of the visualized paranasal sinuses. No mastoid or middle ear effusion. The orbits are normal. IMPRESSION: 1. No acute intracranial abnormality. 2. Old right frontal and parietal infarcts and findings of chronic microvascular ischemia. Electronically Signed   By: Ulyses Jarred M.D.   On: 01/28/2021 00:20   MR ANGIO HEAD WO CONTRAST  Result Date: 01/28/2021 CLINICAL DATA:  60 year old male hypertensive emergency. Stroke-like symptoms. EXAM: MRA HEAD WITHOUT CONTRAST TECHNIQUE: Angiographic images of the Circle of Willis were acquired using MRA technique without intravenous contrast. COMPARISON:  Brain MRI today.  CTA head and neck 04/20/2020. FINDINGS: Degraded by motion artifact. Posterior circulation: Antegrade flow in the posterior circulation. Distal vertebral arteries appears stable since March, the left is mildly dominant. Motion artifact affecting the basilar artery which remains patent. No definite vertebrobasilar  stenosis. PCA origins and small posterior communicating arteries are patent. PCA branch detail is degraded. Anterior circulation: Antegrade flow in both ICA siphons which appear to remain patent to the carotid termini. ICA siphon detail however is degraded. MCA and ACA origins are patent. M1 and A1 segments are patent. But other MCA and ACA branch detail is degraded. Anatomic variants: Dominant left vertebral artery. Other: None. IMPRESSION: 1. Motion degraded intracranial MRA. 2. Reperfused Right ICA siphon since March CTA. No large vessel occlusion today. But no detail of the 2nd order or more distal circle-of-Willis branches. Electronically Signed   By: Genevie Ann M.D.   On: 01/28/2021 08:48   MR BRAIN WO CONTRAST  Result Date: 01/28/2021 CLINICAL DATA:  60 year old male hypertensive emergency. Stroke-like symptoms. EXAM: MRI HEAD WITHOUT CONTRAST TECHNIQUE: Multiplanar, multiecho pulse sequences of the brain and surrounding structures were  obtained without intravenous contrast. COMPARISON:  Head CT 01/28/2021.  Brain MRI 04/21/2020. FINDINGS: Study is intermittently degraded by motion artifact despite repeated imaging attempts. Brain: No convincing restricted diffusion to suggest acute infarction. Mild susceptibility artifact on DWI in the right ACA territory appears related to chronic encephalomalacia with hemosiderin. Superimposed chronic right MCA territory encephalomalacia and gliosis, especially involving the right perirolandic cortex and operculum. Superimposed chronic lacunar infarcts in the bilateral basal ganglia and pons. Superimposed brainstem Wallerian degeneration greater on the right. Several small chronic cerebellar infarcts. No midline shift, mass effect, evidence of mass lesion, ventriculomegaly, extra-axial collection or acute intracranial hemorrhage. Cervicomedullary junction and pituitary are within normal limits. Vascular: Major intracranial vascular flow voids are preserved, stable. Some generalized intracranial artery tortuosity. See MRA today reported separately. Skull and upper cervical spine: Grossly negative cervical spine, normal visible bone marrow signal. Sinuses/Orbits: Stable and negative. Other: Mastoids remain well aerated. Negative visible scalp and face. IMPRESSION: 1. No acute intracranial abnormality. 2. Advanced chronic ischemic disease. Chronic cortical infarcts in the right MCA and ACA territory, bilateral small vessel disease, some brainstem Wallerian degeneration. Electronically Signed   By: Genevie Ann M.D.   On: 01/28/2021 08:43   ECHOCARDIOGRAM COMPLETE  Result Date: 01/29/2021    ECHOCARDIOGRAM REPORT   Patient Name:   Bobby Branch Date of Exam: 01/29/2021 Medical Rec #:  [id number]    Height:       65.0 in Accession #:    [id number]   Weight:       137.0 lb Date of Birth:  1960/06/20    BSA:          1.684 m Patient Age:    60 years     BP:           148/94 mmHg Patient Gender: M            HR:            81 bpm. Exam Location:  Inpatient Procedure: 2D Echo, Cardiac Doppler, Color Doppler, 3D Echo and Strain Analysis Indications:    Stroke  History:        Patient has prior history of Echocardiogram examinations, most                 recent 04/20/2020. Risk Factors:Hypertension and Diabetes.  Sonographer:    Glo Herring Referring Phys: [id number] [place]  1. Left ventricular ejection fraction, by estimation, is 60 to 65%. The left ventricle has normal function. The left ventricle has no regional wall motion abnormalities. There is mild left ventricular hypertrophy. Left ventricular diastolic parameters are consistent with Grade I  diastolic dysfunction (impaired relaxation). The average left ventricular global longitudinal strain is -17.1 %. The global longitudinal strain is normal.  2. Right ventricular systolic function is normal. The right ventricular size is normal.  3. The mitral valve is normal in structure. Trivial mitral valve regurgitation. No evidence of mitral stenosis.  4. The aortic valve is tricuspid. Aortic valve regurgitation is not visualized. Aortic valve sclerosis/calcification is present, without any evidence of aortic stenosis.  5. The inferior vena cava is normal in size with greater than 50% respiratory variability, suggesting right atrial pressure of 3 mmHg. FINDINGS  Left Ventricle: Left ventricular ejection fraction, by estimation, is 60 to 65%. The left ventricle has normal function. The left ventricle has no regional wall motion abnormalities. The average left ventricular global longitudinal strain is -17.1 %. The global longitudinal strain is normal. The left ventricular internal cavity size was normal in size. There is mild left ventricular hypertrophy. Left ventricular diastolic parameters are consistent with Grade I diastolic dysfunction (impaired relaxation). Right Ventricle: The right ventricular size is normal. No increase in right ventricular wall thickness. Right  ventricular systolic function is normal. Left Atrium: Left atrial size was normal in size. Right Atrium: Right atrial size was normal in size. Pericardium: There is no evidence of pericardial effusion. Mitral Valve: The mitral valve is normal in structure. There is mild thickening of the mitral valve leaflet(s). Trivial mitral valve regurgitation. No evidence of mitral valve stenosis. Tricuspid Valve: The tricuspid valve is normal in structure. Tricuspid valve regurgitation is not demonstrated. No evidence of tricuspid stenosis. Aortic Valve: The aortic valve is tricuspid. Aortic valve regurgitation is not visualized. Aortic valve sclerosis/calcification is present, without any evidence of aortic stenosis. Aortic valve mean gradient measures 3.0 mmHg. Aortic valve peak gradient measures 5.4 mmHg. Aortic valve area, by VTI measures 2.81 cm. Pulmonic Valve: The pulmonic valve was normal in structure. Pulmonic valve regurgitation is not visualized. No evidence of pulmonic stenosis. Aorta: The aortic root is normal in size and structure. Venous: The inferior vena cava is normal in size with greater than 50% respiratory variability, suggesting right atrial pressure of 3 mmHg. IAS/Shunts: No atrial level shunt detected by color flow Doppler.  LEFT VENTRICLE PLAX 2D LVIDd:         4.20 cm   Diastology LVIDs:         3.00 cm   LV e' medial:    5.22 cm/s LV PW:         1.20 cm   LV E/e' medial:  10.4 LV IVS:        1.20 cm   LV e' lateral:   7.51 cm/s LVOT diam:     2.00 cm   LV E/e' lateral: 7.2 LV SV:         67 LV SV Index:   40        2D Longitudinal Strain LVOT Area:     3.14 cm  2D Strain GLS Avg:     -17.1 %  RIGHT VENTRICLE             IVC RV Basal diam:  3.70 cm     IVC diam: 1.30 cm RV Mid diam:    2.80 cm RV S prime:     11.60 cm/s LEFT ATRIUM             Index        RIGHT ATRIUM           Index LA diam:  3.40 cm 2.02 cm/m   RA Area:     13.70 cm LA Vol (A2C):   35.2 ml 20.90 ml/m  RA Volume:   32.30  ml  19.18 ml/m LA Vol (A4C):   35.0 ml 20.78 ml/m LA Biplane Vol: 36.8 ml 21.85 ml/m  AORTIC VALVE                    PULMONIC VALVE AV Area (Vmax):    2.95 cm     PV Vmax:       1.17 m/s AV Area (Vmean):   2.57 cm     PV Peak grad:  5.5 mmHg AV Area (VTI):     2.81 cm AV Vmax:           116.00 cm/s AV Vmean:          82.600 cm/s AV VTI:            0.237 m AV Peak Grad:      5.4 mmHg AV Mean Grad:      3.0 mmHg LVOT Vmax:         109.00 cm/s LVOT Vmean:        67.600 cm/s LVOT VTI:          0.212 m LVOT/AV VTI ratio: 0.89  AORTA Ao Root diam: 2.90 cm MITRAL VALVE MV Area (PHT): 5.09 cm    SHUNTS MV Decel Time: 149 msec    Systemic VTI:  0.21 m MV E velocity: 54.20 cm/s  Systemic Diam: 2.00 cm MV A velocity: 95.70 cm/s MV E/A ratio:  0.57 Jenkins Rouge MD Electronically signed by Jenkins Rouge MD Signature Date/Time: 01/29/2021/3:34:05 PM    Final     Labs:  CBC: Recent Labs    06/21/20 0618 01/27/21 2302 01/30/21 0313 01/31/21 0115  WBC 7.7 8.0 6.1 14.3*  HGB 12.6* 13.1 11.8* 10.6*  HCT 39.1 39.7 34.7* 32.1*  PLT 321 339 296 273    COAGS: Recent Labs    04/20/20 0011 06/21/20 0618 01/30/21 0313  INR 0.9 0.9 0.9  APTT 32  --   --     BMP: Recent Labs    06/21/20 0618 11/22/20 1203 01/27/21 2302 01/30/21 0313 01/31/21 0115  NA 139 140 139 136 132*  K 4.0 4.5 4.3 4.1 4.4  CL 105 102 107 104 107  CO2 28 23 27 23  20*  GLUCOSE 75 207* 74 74 197*  BUN 22* 18 19 28* 24*  CALCIUM 9.4 9.4 8.9 8.5* 7.8*  CREATININE 2.57* 2.19* 2.03* 2.60* 2.40*  GFRNONAA 28*  --  37* 28* 31*    LIVER FUNCTION TESTS: Recent Labs    04/20/20 0500 04/24/20 0504 11/22/20 1203 01/27/21 2302  BILITOT 0.6 0.5 0.3 0.5  AST 34 26 34 23  ALT 23 15 26 22   ALKPHOS 68 60 92 71  PROT 5.4* 5.4* 7.0 6.8  ALBUMIN 2.5* 2.1* 4.3 3.3*    Assessment and Plan:  60 y.o. 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 Corrie Mckusick. Left ICA stent placement was recommended to the patient but patient did not follow up. Presented to [facility] ED on 12/26 with headache, numbness sensation to his right scalp and visual changes. Imaging obtained showed right ICA intra-stent thrombosis and severe left ICA stenosis, s/p bilateral carotid arteriogram which showed patent R carotid stent and severe left ICA stenosis, underwent revascularization  of prox Lt ICA with distal protection with Dr. Estanislado Pandy on 12/28.   Patient appears to be back to baseline, ok to be transferred to hospitalist service.   Further treatment plan per [facility] Appreciate and agree with the plan.  Please call NIR for questions and concerns.    Electronically Signed: Tera Mater, PA-C 01/31/2021, 9:08 AM   I spent a total of 15 Minutes at the the patient's bedside AND on the patient's hospital floor or unit, greater than 50% of which was counseling/coordinating care for bilateral ICA  stent   This chart was dictated using voice recognition software.  Despite best efforts to proofread,  errors can occur which can change the documentation meaning.

## 2021-01-31 NOTE — Progress Notes (Signed)
Physical Therapy Treatment Patient Details Name: Bobby Branch MRN: [id number] DOB: Jun 23, 1960 Today's Date: 01/31/2021   History of Present Illness 60 yo male presents to [facility] on 12/25 with HTN, R-sided scalp numbness, headache. CTA demonstrates severe L ICA stenosis. s/p bilat common carotid arteriogram with L ICA stent placement 12/28. 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    PT Comments    Pt complaining of moderate to severe abdominal pain, but agreeable to mobility. Pt moving more slowly today and requires light assist to steady especially during transfers. Pt reports feeling overall more weak post-procedure, but no focal deficits observed other than chronic L deficits. PT recommending ST-SNF based on session today, will need to be mod I for mobility to d/c home since he lives alone.     Recommendations for follow up therapy are one component of a multi-disciplinary discharge planning process, led by the attending physician.  Recommendations may be updated based on patient status, additional functional criteria and insurance authorization.  Follow Up Recommendations  Skilled nursing-short term rehab (<3 hours/day) (based on today's mobility)     Assistance Recommended at Discharge Set up [place] (4 wheels)    Recommendations for Other Services       Precautions / Restrictions Precautions Precautions: Fall (moderate) Restrictions Weight Bearing Restrictions: No     Mobility  Bed Mobility Overal bed mobility: Needs Assistance Bed Mobility: Supine to Sit;Sit to Supine     Supine to sit: Min assist Sit to supine: Min assist   General bed mobility comments: assist for trunk elevation and lowering, LE lifting into bed.    Transfers Overall transfer level: Needs assistance Equipment used: None Transfers: Sit to/from Stand Sit to Stand: Min  assist           General transfer comment: light steadying assist, slow to rise and gain balance.    Ambulation/Gait Ambulation/Gait assistance: Min guard Gait Distance (Feet): 60 Feet Assistive device: None Gait Pattern/deviations: Step-through pattern;Decreased stride length;Decreased dorsiflexion - left;Trunk flexed Gait velocity: decr     General Gait Details: cues for upright posture, but limited by moderate to severe abdominal pain   Stairs             Wheelchair Mobility    Modified Rankin (Stroke Patients Only) Modified Rankin (Stroke Patients Only) Pre-Morbid Rankin Score: Moderate disability Modified Rankin: Moderately severe disability     Balance Overall balance assessment: Needs assistance;History of Falls Sitting-balance support: Feet supported;No upper extremity supported Sitting balance-Leahy Scale: Good     Standing balance support: No upper extremity supported;During functional activity Standing balance-Leahy Scale: Fair                              Cognition Arousal/Alertness: Awake/alert Behavior During Therapy: Flat affect Overall Cognitive Status: No family/caregiver present to determine baseline cognitive functioning                                 General Comments: flat affect, increased time to follow commands        Exercises      General Comments        Pertinent Vitals/Pain Pain Assessment: Faces Faces Pain Scale: Hurts even more Pain Location: abdomen Pain Descriptors / Indicators: Sore;Discomfort;Grimacing;Other (Comment) (worse on inspiration) Pain Intervention(s): Limited activity  within patient's tolerance;Monitored during session;Repositioned    Home Living                          Prior Function            PT Goals (current goals can now be found in the care plan section) Acute Rehab PT Goals PT Goal Formulation: With patient Time For Goal Achievement:  02/12/21 Potential to Achieve Goals: Good Progress towards PT goals: Progressing toward goals    Frequency    Min 3X/week      PT Plan Discharge plan needs to be updated    Co-evaluation              AM-PAC PT "6 Clicks" Mobility   Outcome Measure  Help needed turning from your back to your side while in a flat bed without using bedrails?: A Little Help needed moving from lying on your back to sitting on the side of a flat bed without using bedrails?: A Little Help needed moving to and from a bed to a chair (including a wheelchair)?: A Little Help needed standing up from a chair using your arms (e.g., wheelchair or bedside chair)?: A Little Help needed to walk in hospital room?: A Little Help needed climbing 3-5 steps with a railing? : A Lot 6 Click Score: 17    End of Session   Activity Tolerance: Patient limited by pain Patient left: in bed;with call bell/phone within reach;with bed alarm set Nurse Communication: Mobility status PT Visit Diagnosis: Other abnormalities of gait and mobility (R26.89);Hemiplegia and hemiparesis Hemiplegia - dominant/non-dominant: Non-dominant Hemiplegia - caused by: Cerebral infarction     Time: 5852-7782 PT Time Calculation (min) (ACUTE ONLY): 12 min  Charges:  $Gait Training: 8-22 mins                     Stacie Glaze, PT DPT Acute Rehabilitation Services Pager [phone]  Office [phone]    [place] 01/31/2021, 4:26 PM

## 2021-01-31 NOTE — Progress Notes (Signed)
ANTICOAGULATION CONSULT NOTE  Pharmacy Consult for Heparin Indication:  Post-interventional neuroradiologic procedure Brief A/P: Heparin level within goal range Continue Heparin at current rate   No Known Allergies  Patient Measurements: Height: 5\' 5"  (165.1 cm) Weight: 62.1 kg (137 lb) IBW/kg (Calculated) : 61.5 Heparin Dosing Weight: 62.1 kg  Vital Signs: Temp: 97.9 F (36.6 C) (12/28 2319) Temp Source: Oral (12/28 2319) BP: 141/77 (12/28 2000) Pulse Rate: 80 (12/28 2000)  Labs: Recent Labs    01/28/21 0833 01/28/21 1025 01/30/21 0313 01/31/21 0114 01/31/21 0115  HGB  --   --  11.8*  --  10.6*  HCT  --   --  34.7*  --  32.1*  PLT  --   --  296  --  273  LABPROT  --   --  12.2  --   --   INR  --   --  0.9  --   --   HEPARINUNFRC  --   --   --  0.15*  --   CREATININE  --   --  2.60*  --  2.40*  TROPONINIHS 9 10  --   --   --      Estimated Creatinine Clearance: 29.2 mL/min (A) (by C-G formula based on SCr of 2.4 mg/dL (H)).  Assessment: 60 year old male s/p revascularization of proximal left ICA for heparin.    Goal of Therapy:  Heparin level 0.1 to 0.25 units/ml Monitor platelets by anticoagulation protocol: Yes   Plan:  Continue Heparin at current rate    Charmian Forbis, [place] 01/31/2021,2:11 AM

## 2021-01-31 NOTE — Progress Notes (Signed)
STROKE TEAM PROGRESS NOTE   INTERVAL HISTORY No family at bedside.  Patient sitting in chair, still has chronic left hemiparesis, unchanged.  Had left ICA stenting yesterday with Dr. Estanislado Pandy, doing well.  On aspirin and Brilinta.  Right ICA stent widely patent with minimal stenosis on angiogram yesterday.  Vitals:   01/31/21 1200 01/31/21 1300 01/31/21 1631 01/31/21 1702  BP: 96/61 (!) 118/56 140/69 138/74  Pulse: 73 87 94 80  Resp: 18 16  15   Temp: 99.1 F (37.3 C)   98.4 F (36.9 C)  TempSrc: Axillary   Oral  SpO2: 99% 98%  99%  Weight:      Height:       CBC:  Recent Labs  Lab 01/30/21 0313 01/31/21 0115  WBC 6.1 14.3*  NEUTROABS 3.2 12.5*  HGB 11.8* 10.6*  HCT 34.7* 32.1*  MCV 91.6 94.4  PLT 296 694   Basic Metabolic Panel:  Recent Labs  Lab 01/30/21 0313 01/31/21 0115  NA 136 132*  K 4.1 4.4  CL 104 107  CO2 23 20*  GLUCOSE 74 197*  BUN 28* 24*  CREATININE 2.60* 2.40*  CALCIUM 8.5* 7.8*  MG 2.0  --    Lipid Panel:  Recent Labs  Lab 01/29/21 0036  CHOL 197  TRIG 191*  HDL 45  CHOLHDL 4.4  VLDL 38  LDLCALC 114*   HgbA1c:  Recent Labs  Lab 01/29/21 0036  HGBA1C 6.5*   Urine Drug Screen:  Recent Labs  Lab 01/29/21 0030  LABOPIA NONE DETECTED  COCAINSCRNUR NONE DETECTED  LABBENZ NONE DETECTED  AMPHETMU NONE DETECTED  THCU NONE DETECTED  LABBARB NONE DETECTED    Alcohol Level No results for input(s): ETH in the last 168 hours.  IMAGING past 24 hours No results found.  PHYSICAL EXAM  Temp:  [97.7 F (36.5 C)-99.3 F (37.4 C)] 98.4 F (36.9 C) (12/29 1702) Pulse Rate:  [73-97] 80 (12/29 1702) Resp:  [12-25] 15 (12/29 1702) BP: (89-142)/(53-77) 138/74 (12/29 1702) SpO2:  [95 %-99 %] 99 % (12/29 1702) Arterial Line BP: (118-150)/(48-66) 139/63 (12/29 0900)  General - Well nourished, well developed, in no apparent distress.  Ophthalmologic - fundi not visualized due to noncooperation.  Cardiovascular - Regular rhythm and  rate.  Mental Status -  Level of arousal and orientation to time, place, and person were intact. Language including expression, naming, repetition, comprehension was assessed and found intact. Fund of Knowledge was assessed and was intact.  Cranial Nerves II - XII - II - Visual field intact OU. III, IV, VI - Extraocular movements intact. V - Facial sensation intact bilaterally. VII - left facial droop VIII - Hearing & vestibular intact bilaterally. X - Palate elevates symmetrically.  Slight dysarthria XI - Chin turning & shoulder shrug intact bilaterally. XII - Tongue protrusion intact.  Motor Strength - The patients strength was normal in RUE and RLE.  However, left upper extremity flexion contracture, with only slight finger movement and bicep movement.  Left lower extremity proximal 4/5, distal ankle DF/PF 2/5.  Bulk was normal and fasciculations were absent.   Motor Tone - Muscle tone was assessed at the neck and appendages and was increased left upper extremity.  Reflexes - The patients reflexes were 3+ in LUE and LLE and he had positive Babinski on the left.  Sensory - Light touch, temperature/pinprick were assessed and were symmetrical.    Coordination - The patient had normal movements in the right hand with no ataxia or dysmetria.  Tremor was absent.  Gait and Station - deferred.   ASSESSMENT/PLAN [name] is a 60 y.o. male with history of previous Rt MCA stroke, T2DM, HTN, CKD, HLD, polysubstance abuse, and constipation presenting with right side scalp tingling. He was initially supposed to have his L carotid artery stented however, he had issues with transportation.   TIA:  left brain TIA likely secondary due to left ICA high grade stenosis CT head-Old right frontal and parietal infarcts CTA head & neck- right carotid artery stent filling defect within the stent with 70% stenosis within the stent. Left carotid artery withy extensive soft and calcified plaque at  the carotid bifurcation and ICA bulb with ulceration. Lumen of the proximal ICA is markedly narrowed, with serial stenoses showing a diameter of 59mm.  IR 12/28 right ICA stent patent with minimal stenosis MRI  no acute intracranial abnormality. Chronic cortical infarcts in the right MCA and ACA territory. 2D Echo EF 60-65%, LV grade 1 diastolic dysfunction, no shunt detected LDL 114 HgbA1c 6.5 UDS negative VTE prophylaxis - Lovenox ASA 81 prior to admission, now on aspirin 81 mg daily and brilinta after loading Therapy recommendations:  SNF Disposition:  Pending  Bilateral carotid Stenosis  CTA head and neck right ICA stent filling defect with 70% stenosis, left ICA severe stenosis. Lt carotid artery stenting placed on 01/30/2021 by Dr. Estanislado Pandy IR 12/28 showed right ICA stent patent with minimal stenosis  History of stroke 09/2019 stroke with mild residual, details not available 04/2020 admitted for left-sided weakness numbness and right gaze.  CT no acute abnormality.  CTA head neck right M2/M3 occlusion, right ICA occlusion, left ICA string sign.  Status post tPA and IR with TICI3 reperfusion.  Right ICA 40% stenosis at end of procedure, no stent placed.  Postprocedure, carotid Doppler right ICA 60 to 79% stenosis, left ICA 80 to 99% stenosis.  MRI showed right MCA patchy small infarcts, and right small cerebellum infarct.  EF 55 to 60%.  UDS showed positive cocaine and THC.  LDL 98, A1c 7.3.  Discharged on DAPT for 3 months as well as Lipitor 40. 06/2020 had right ICA stenting with Dr. Earleen Newport. P2Y12 87 in 12/2020  Hypertension Home meds:  losartan 25mg  daily, coreg 12.5 BID Stable Avoid low BP BP goal normotensive  Hyperlipidemia Home meds:  Atorvastatin 40mg  LDL 114, goal < 70 Increase Lipitor to 80 Continue statin at discharge  Tobacco abuse Current smoker Smoking cessation counseling provided Nicotine patch provided Pt is willing to quit  Other Stroke Risk  Factors History of substance abuse    Other Active Problems CKD IIIa, creatinine 2.03  Hospital day # 3  Neurology will sign off. Please call with questions. Pt will follow up with stroke clinic NP at [facility] in about 4 weeks. Thanks for the consult.   Rosalin Hawking, MD PhD Stroke Neurology 01/31/2021 6:12 PM   To contact Stroke Continuity provider, please refer to [email or web address] After hours, contact General Neurology

## 2021-01-31 NOTE — TOC CAGE-AID Note (Signed)
Transition of Care [facility]) - CAGE-AID Screening   Patient Details  Name: Bobby Branch MRN: [id number] Date of Birth: Nov 25, 1960  [facility]) CM/SW Contact:    Gaetano Hawthorne Tarpley-Carter, [place] Phone Number: 01/31/2021, 2:53 PM   Clinical Narrative: Pt participated in [place].  Pt stated he does use substance and ETOH.  Pt was offered resources, due to usage of substance and ETOH.     Elman Dettman Tarpley-Carter, MSW, LCSW-A Pronouns:  She/Her/Hers [place] Transitions of Care Clinical Social Worker Direct Number:  [phone] [email or web address]   CAGE-AID Screening:    Have You Ever Felt You Ought to Cut Down on Your Drinking or Drug Use?: Yes Have People Annoyed You By Critizing Your Drinking Or Drug Use?: No Have You Felt Bad Or Guilty About Your Drinking Or Drug Use?: Yes Have You Ever Had a Drink or Used Drugs First Thing In The Morning to Steady Your Nerves or to Get Rid of a Hangover?: No CAGE-AID Score: 2  Substance Abuse Education Offered: Yes  Substance abuse interventions: Scientist, clinical (histocompatibility and immunogenetics)

## 2021-01-31 NOTE — Progress Notes (Signed)
Report called to 3W. All of patients belongings sent with the patient. Patient voided before leaving the unit.

## 2021-02-01 ENCOUNTER — Ambulatory Visit: Payer: Federal, State, Local not specified - PPO | Admitting: Physical Therapy

## 2021-02-01 LAB — GLUCOSE, CAPILLARY
Glucose-Capillary: 198 mg/dL — ABNORMAL HIGH (ref 70–99)
Glucose-Capillary: 144 mg/dL — ABNORMAL HIGH (ref 70–99)
Glucose-Capillary: 173 mg/dL — ABNORMAL HIGH (ref 70–99)
Glucose-Capillary: 168 mg/dL — ABNORMAL HIGH (ref 70–99)

## 2021-02-01 NOTE — Plan of Care (Signed)

## 2021-02-01 NOTE — NC FL2 (Signed)
[place] LEVEL OF CARE SCREENING TOOL     IDENTIFICATION  Patient Name: Bobby Branch Birthdate: Aug 18, 1960 Sex: male Admission Date (Current Location): 01/27/2021  [facility] and [place] Number:  Herbalist and Address:  [facility] [place], [place] [address], [place], [place] [address]      Provider Number: [id number]  Attending Physician Name and Address:  Caren Griffins, MD  Relative Name and Phone Number:       Current Level of Care: Hospital Recommended Level of Care: [place] Prior Approval Number:    Date Approved/Denied:   PASRR Number: [id number]  Discharge Plan: SNF    Current Diagnoses: Patient Active Problem List   Diagnosis Date Noted   Internal carotid artery stenosis, left 01/30/2021   Carotid stenosis, symptomatic w/o infarct, left 01/28/2021   Polysubstance abuse ([place]) 01/28/2021   Depression 01/28/2021   Internal carotid artery stenosis, bilateral 06/21/2020   Slow transit constipation    Spastic hemiplegia affecting nondominant side ([place])    Stage 3b chronic kidney disease ([place])    Essential hypertension    Controlled type 2 diabetes mellitus with hyperglycemia, without long-term current use of insulin ([place])    Protein-calorie malnutrition, severe 04/25/2020   Right middle cerebral artery stroke ([place]) 04/23/2020   Acute right MCA stroke ([place]) 04/20/2020    Orientation RESPIRATION BLADDER Height & Weight     Self, Time, Situation, Place  Normal Continent Weight: 137 lb (62.1 kg) Height:  5\' 5"  (165.1 cm)  BEHAVIORAL SYMPTOMS/MOOD NEUROLOGICAL BOWEL NUTRITION STATUS      Continent Diet (carb modified)  AMBULATORY STATUS COMMUNICATION OF NEEDS Skin   Limited Assist Verbally Surgical wounds (right groin, gauze dressing: change PRN)                       Personal Care Assistance Level of Assistance  Bathing, Feeding, Dressing Bathing Assistance: Limited assistance Feeding  assistance: Independent Dressing Assistance: Limited assistance     Functional Limitations Info  Sight Sight Info: Impaired (right eye impaired vision)        SPECIAL CARE FACTORS FREQUENCY  PT (By licensed PT), OT (By licensed OT)     PT Frequency: 5x/wk OT Frequency: 5x/wk            Contractures Contractures Info: Not present    Additional Factors Info  Code Status, Allergies, Psychotropic, Insulin Sliding Scale Code Status Info: Full Allergies Info: NKA Psychotropic Info: Cymbalta 60mg  daily Insulin Sliding Scale Info: see DC summary       Current Medications (02/01/2021):  This is the current hospital active medication list Current Facility-Administered Medications  Medication Dose Route Frequency Provider Last Rate Last Admin   0.9 %  sodium chloride infusion  250 mL Intravenous PRN Orma Flaming, MD       0.9 %  sodium chloride infusion   Intravenous Continuous Luanne Bras, MD 75 mL/hr at 02/01/21 0046 New Bag at 02/01/21 0046   acetaminophen (TYLENOL) tablet 650 mg  650 mg Oral Q4H PRN Orma Flaming, MD   650 mg at 02/01/21 1150   Or   acetaminophen (TYLENOL) 160 MG/5ML solution 650 mg  650 mg Per Tube Q4H PRN Orma Flaming, MD   650 mg at 01/30/21 1037   Or   acetaminophen (TYLENOL) suppository 650 mg  650 mg Rectal Q4H PRN Orma Flaming, MD       albuterol (PROVENTIL) (2.5 MG/3ML) 0.083% nebulizer solution 3 mL  3 mL Inhalation Q6H PRN Orma Flaming, MD       aspirin chewable tablet 81 mg  81 mg Oral Daily Deveshwar, Willaim Rayas, MD   81 mg at 02/01/21 1234   Or   aspirin chewable tablet 81 mg  81 mg Per Tube Daily Deveshwar, Willaim Rayas, MD       atorvastatin (LIPITOR) tablet 80 mg  80 mg Oral Daily Rosalin Hawking, MD   80 mg at 02/01/21 1235   carvedilol (COREG) tablet 12.5 mg  12.5 mg Oral BID WC Orma Flaming, MD   12.5 mg at 02/01/21 7096   Chlorhexidine Gluconate Cloth 2 % PADS 6 each  6 each Topical [id number] Luanne Bras, MD   6 each at 02/01/21  0518   diclofenac Sodium (VOLTAREN) 1 % topical gel 2 g  2 g Topical QID Orma Flaming, MD   2 g at 02/01/21 1501   DULoxetine (CYMBALTA) DR capsule 60 mg  60 mg Oral Daily Orma Flaming, MD   60 mg at 02/01/21 1234   enoxaparin (LOVENOX) injection 30 mg  30 mg Subcutaneous Q24H Rosalin Hawking, MD   30 mg at 01/31/21 1759   famotidine (PEPCID) tablet 20 mg  20 mg Oral Daily Orma Flaming, MD   20 mg at 02/01/21 1234   fentaNYL (SUBLIMAZE) injection 12.5 mcg  12.5 mcg Intravenous Once Luanne Bras, MD       hydrALAZINE (APRESOLINE) injection 5-10 mg  5-10 mg Intravenous Q4H PRN Etta Quill, DO   10 mg at 01/29/21 2947   hydrALAZINE (APRESOLINE) tablet 50 mg  50 mg Oral BID Orma Flaming, MD   50 mg at 02/01/21 1235   insulin aspart (novoLOG) injection 0-9 Units  0-9 Units Subcutaneous TID WC Orma Flaming, MD   1 Units at 02/01/21 1500   losartan (COZAAR) tablet 25 mg  25 mg Oral BID Orma Flaming, MD   25 mg at 02/01/21 1235   multivitamin with minerals tablet 1 tablet  1 tablet Oral Daily Orma Flaming, MD   1 tablet at 02/01/21 1234   mupirocin ointment (BACTROBAN) 2 % 1 application  1 application Nasal BID Orma Flaming, MD   1 application at 65/46/50 1242   nicotine (NICODERM CQ - dosed in mg/24 hours) patch 21 mg  21 mg Transdermal Daily Orma Flaming, MD   21 mg at 02/01/21 1237   polyethylene glycol (MIRALAX / GLYCOLAX) packet 17 g  17 g Oral Daily PRN Etta Quill, DO   17 g at 02/01/21 3546   senna-docusate (Senokot-S) tablet 1 tablet  1 tablet Oral QHS PRN Orma Flaming, MD   1 tablet at 02/01/21 0517   sodium chloride flush (NS) 0.9 % injection 3 mL  3 mL Intravenous Q12H Orma Flaming, MD   3 mL at 02/01/21 1242   sodium chloride flush (NS) 0.9 % injection 3 mL  3 mL Intravenous PRN Orma Flaming, MD       ticagrelor Kary Kos) tablet 90 mg  90 mg Oral BID Luanne Bras, MD   90 mg at 02/01/21 1235   Or   ticagrelor (BRILINTA) tablet 90 mg  90 mg Per Tube  BID Luanne Bras, MD       tiZANidine (ZANAFLEX) tablet 2 mg  2 mg Oral TID Orma Flaming, MD   2 mg at 02/01/21 1235     Discharge Medications: Please see discharge summary for a list of discharge medications.  Relevant Imaging Results:  Relevant Lab Results:  Additional Information SS#: [id number]  Geralynn Ochs, LCSW

## 2021-02-01 NOTE — Progress Notes (Signed)
Physical Therapy Treatment Patient Details Name: Bobby Branch MRN: [id number] DOB: February 16, 1960 Today's Date: 02/01/2021   History of Present Illness 60 yo male presents to [facility] on 12/25 with HTN, R-sided scalp numbness, headache. CTA demonstrates severe L ICA stenosis. s/p bilat common carotid arteriogram with L ICA stent placement 12/28. 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    PT Comments    Pt received in supine, sleeping but able to be awoken, with good participation and tolerance for transfer and gait progression using RW. Pt with multiple LOB needing up to modA to correct while supported by RW and encouraged to trial cane/QC but pt refusing these devices. Pt needing min guard for safety with bed mobility and transfers. Pt continues to benefit from PT services to progress toward functional mobility goals. Continue to recommend SNF, pt would need to be modI or have 24/7 physical assist to DC home but reports he lives alone currently, pt agreeable to SNF.   Recommendations for follow up therapy are one component of a multi-disciplinary discharge planning process, led by the attending physician.  Recommendations may be updated based on patient status, additional functional criteria and insurance authorization.  Follow Up Recommendations  Skilled nursing-short term rehab (<3 hours/day)     Assistance Recommended at Discharge Frequent or constant Supervision/Assistance (would currently need physical assist for all mobility tasks)  Equipment Recommendations  Rollator (4 wheels)    Recommendations for Other Services       Precautions / Restrictions Precautions Precautions: Fall (moderate) Restrictions Weight Bearing Restrictions: No     Mobility  Bed Mobility Overal bed mobility: Needs Assistance Bed Mobility: Sit to Supine;Rolling;Sidelying to Sit Rolling: Supervision Sidelying to sit: Min guard   Sit to  supine: Min guard   General bed mobility comments: min cues for technique/safety and line awareness; use of bed rail to perform    Transfers Overall transfer level: Needs assistance Equipment used: None Transfers: Sit to/from Stand Sit to Stand: Min guard           General transfer comment: from EOB to RW, min guard at gait belt for safety and cues for UE placement/technique    Ambulation/Gait Ambulation/Gait assistance: Min guard;Mod assist Gait Distance (Feet): 75 Feet Assistive device: Rolling walker (2 wheels) Gait Pattern/deviations: Step-through pattern;Decreased stride length;Decreased dorsiflexion - left;Trunk flexed Gait velocity: decr     General Gait Details: cues for upright posture, pt encouraged to attempt with cane vs HHA but refusing and requesting to use RW due to drowsiness although only able to self-support with single UE; pt with LOB exiting room needing modA to correct. Intermittently needs cues to stop and improve proximity to [employer]    Modified Rankin (Stroke Patients Only) Modified Rankin (Stroke Patients Only) Pre-Morbid Rankin Score: Moderate disability Modified Rankin: Moderately severe disability     Balance Overall balance assessment: Needs assistance;History of Falls Sitting-balance support: Feet supported;No upper extremity supported Sitting balance-Leahy Scale: Good Sitting balance - Comments: Good static seated balance, fair dynamic seated balance when attempting to don pants needs min guard for safety   Standing balance support: No upper extremity supported;During functional activity Standing balance-Leahy Scale: Poor Standing balance comment: pt with LOB x3 during gait needing min/modA correction via gait belt for steadying and reliant on U UE support throughout gait trial for safety  Cognition Arousal/Alertness: Awake/alert Behavior During Therapy:  Flat affect Overall Cognitive Status: No family/caregiver present to determine baseline cognitive functioning                                 General Comments: pt aware of deficits, frustrated by his drowsiness s/p medications, motivated to progress his independence, not agitated today but apologizing to staff that he is frustrated at end of session        Exercises      General Comments General comments (skin integrity, edema, etc.): VSS on RA, pt reports drowsiness and BLE fatigue after meds but no dizziness.      Pertinent Vitals/Pain Pain Assessment: Faces Faces Pain Scale: Hurts a little bit Breathing: normal Negative Vocalization: occasional moan/groan, low speech, negative/disapproving quality Facial Expression: smiling or inexpressive Body Language: relaxed Consolability: no need to console PAINAD Score: 1 Pain Location: generalized Pain Descriptors / Indicators: Discomfort;Grimacing Pain Intervention(s): Monitored during session;Repositioned    Home Living                          Prior Function            PT Goals (current goals can now be found in the care plan section) Acute Rehab PT Goals Patient Stated Goal: to get stronger and work toward going home, agreeable to [facility] if he needs to PT Goal Formulation: With patient Time For Goal Achievement: 02/12/21 Progress towards PT goals: Progressing toward goals    Frequency    Min 3X/week      PT Plan Current plan remains appropriate    Co-evaluation              AM-PAC PT "6 Clicks" Mobility   Outcome Measure  Help needed turning from your back to your side while in a flat bed without using bedrails?: A Little Help needed moving from lying on your back to sitting on the side of a flat bed without using bedrails?: A Little Help needed moving to and from a bed to a chair (including a wheelchair)?: A Little Help needed standing up from a chair using your arms (e.g.,  wheelchair or bedside chair)?: A Little Help needed to walk in hospital room?: A Lot Help needed climbing 3-5 steps with a railing? : Total 6 Click Score: 15    End of Session Equipment Utilized During Treatment: Gait belt Activity Tolerance: Patient limited by fatigue;Other (comment) (pt reports fatigue/lethargy s/p meds) Patient left: in bed;with call bell/phone within reach;with bed alarm set Nurse Communication: Mobility status;Other (comment) (pt drowsiness) PT Visit Diagnosis: Other abnormalities of gait and mobility (R26.89);Hemiplegia and hemiparesis Hemiplegia - Right/Left: Left Hemiplegia - dominant/non-dominant: Non-dominant Hemiplegia - caused by: Cerebral infarction     Time: 9357-0177 PT Time Calculation (min) (ACUTE ONLY): 24 min  Charges:  $Gait Training: 8-22 mins $Therapeutic Activity: 8-22 mins                     Haadiya Frogge P., PTA Acute Rehabilitation Services Pager: [phone] Office: [place] 02/01/2021, 3:40 PM

## 2021-02-01 NOTE — TOC Progression Note (Signed)
Transition of Care [facility]) - Progression Note    Patient Details  Name: Bobby Branch MRN: [id number] Date of Birth: [birth year]  [facility] of [facility]) CM/SW [place], [place] Phone Number: 02/01/2021, 4:15 PM  Clinical Narrative:   CSW met with patient earlier today to discuss SNF placement, but patient wasn't sure about that; he wanted to think about it. Patient said he's been doing outpatient rehab at neuro rehab if he could continue that. CSW asked PT to resee, they are still recommending SNF. CSW met with patient again after PT session, patient agreeable to SNF at that time. CSW faxed out referral, provided patient with initial bed offers to review. CSW to follow.    Expected Discharge Plan: Skilled Nursing Facility Barriers to Discharge: Continued Medical Work up, Ship broker  Expected Discharge Plan and Services Expected Discharge Plan: [place]   Discharge Planning Services: CM Consult Post Acute Care Choice: [place] arrangements for the past 2 months: Apartment                                       Social Determinants of Health (SDOH) Interventions    Readmission Risk Interventions No flowsheet data found.

## 2021-02-01 NOTE — Progress Notes (Signed)
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  MRI of the brain showed no acute abnormalities, chronic infarction right MCA and ACA territory.  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  He apparently ran out of Plavix about a month ago.  Neurology consulted, started on aspirin and Plavix  Subjective / 24h Interval events: Complains of nose pain, concerned that he tested positive for MRSA in his nares  Assessment & Plan: Principal Problem TIA - presented with right-sided scalp tingling, patient with history of previous CVA.  Neurology consulted and following.  Imaging on admission was negative for acute CVA.  CT angiogram showed severe stenosis of left ICA as well as right carotid artery stenting with intra stent thrombus occupying 50% of the lumen.  2D echo showed EF 60 to 65%, grade 1 DD.  LDL is 114.  A1c 6.5.  Currently on Brilinta and aspirin.  Underwent cerebral angiogram yesterday status post left ICA stent placement.  He already had a right ICA stented in May.  Current recommendations are for SNF, placement pending  Active Problems Bilateral carotid stenosis status post right carotid stenting -as above  Hypertension-continue Coreg, losartan, hydralazine.  Overall stable  Diabetes mellitus type 2 with hyperglycemia and hypoglycemia -A1c 6.5.  Continue CBGs with SSI  Hyperlipidemia -LDL 114.  Continue Lipitor  CKD stage IIIb -Baseline creatinine 2-2.4.  Remains at baseline.  Recheck renal function tomorrow   History of polysubstance abuse -Denies recent cocaine  use.  Urine drug screen negative   Depression -Continue Cymbalta  Scheduled Meds:  aspirin  81 mg Oral Daily   Or   aspirin  81 mg Per Tube Daily   atorvastatin  80 mg Oral Daily   carvedilol  12.5 mg Oral BID WC   Chlorhexidine Gluconate Cloth  6 each Topical Q0600   diclofenac Sodium  2 g Topical QID   DULoxetine  60 mg Oral Daily   enoxaparin (LOVENOX) injection  30 mg Subcutaneous Q24H   famotidine  20 mg Oral Daily   fentaNYL (SUBLIMAZE) injection  12.5 mcg Intravenous Once   hydrALAZINE  50 mg Oral BID   insulin aspart  0-9 Units Subcutaneous TID WC   losartan  25 mg Oral BID   multivitamin with minerals  1 tablet Oral Daily   mupirocin ointment  1 application Nasal BID   nicotine  21 mg Transdermal Daily   sodium chloride flush  3 mL Intravenous Q12H   ticagrelor  90 mg Oral BID   Or   ticagrelor  90 mg Per Tube BID   tiZANidine  2 mg Oral TID   Continuous Infusions:  sodium chloride     sodium chloride 75 mL/hr at 02/01/21 0046   PRN Meds:.sodium chloride, acetaminophen **OR** acetaminophen (TYLENOL) oral liquid 160 mg/5 mL **OR** acetaminophen, albuterol, hydrALAZINE, polyethylene glycol, senna-docusate, sodium chloride flush  Diet Orders (From admission, onward)     Start     Ordered   01/31/21 0615  Diet Carb Modified Fluid consistency: Thin; Room service appropriate? Yes  Diet effective now       Question Answer Comment  Diet-HS  Snack? Nothing   Calorie Level Medium 1600-2000   Fluid consistency: Thin   Room service appropriate? Yes      01/31/21 0614            DVT prophylaxis: enoxaparin (LOVENOX) injection 30 mg Start: 01/29/21 1830 SCD's Start: 01/28/21 1401     Code Status: Full Code  Family Communication: no family at bedside   Status is: Inpatient  Remains inpatient appropriate because: SNF placement pending  Level of care: Progressive  Consultants:  Neurology   Procedures:  none  Microbiology  none  Antimicrobials: none     Objective: Vitals:   01/31/21 2331 02/01/21 0356 02/01/21 0904 02/01/21 1145  BP: (!) 159/80 (!) 143/76 (!) 164/79 (!) 165/75  Pulse: 97 99 78 87  Resp: 16 17 13 16   Temp: 98.7 F (37.1 C) 98.2 F (36.8 C) 97.8 F (36.6 C) 97.6 F (36.4 C)  TempSrc: Oral Oral Oral Oral  SpO2: 99% 99% 99% 100%  Weight:      Height:        Intake/Output Summary (Last 24 hours) at 02/01/2021 1154 Last data filed at 01/31/2021 1400 Gross per 24 hour  Intake 240 ml  Output --  Net 240 ml    Filed Weights   01/27/21 2214  Weight: 62.1 kg    Examination:  Constitutional: Is in no distress Eyes: No scleral icterus ENMT: Moist mucous membranes Neck: normal, supple Respiratory: Lungs are clear bilaterally, no wheezing or crackles heard Cardiovascular: Regular rate and rhythm, no murmurs, no edema Abdomen: Soft, NT, ND, positive bowel sounds Musculoskeletal: no clubbing / cyanosis.  Skin: No rashes seen Neurologic: chronic left sided deficits, no new focal deficits  Data Reviewed: I have independently reviewed following labs and imaging studies   CBC: Recent Labs  Lab 01/27/21 2302 01/30/21 0313 01/31/21 0115  WBC 8.0 6.1 14.3*  NEUTROABS 5.0 3.2 12.5*  HGB 13.1 11.8* 10.6*  HCT 39.7 34.7* 32.1*  MCV 95.7 91.6 94.4  PLT 339 296 803    Basic Metabolic Panel: Recent Labs  Lab 01/27/21 2302 01/30/21 0313 01/31/21 0115  NA 139 136 132*  K 4.3 4.1 4.4  CL 107 104 107  CO2 27 23 20*  GLUCOSE 74 74 197*  BUN 19 28* 24*  CREATININE 2.03* 2.60* 2.40*  CALCIUM 8.9 8.5* 7.8*  MG  --  2.0  --     Liver Function Tests: Recent Labs  Lab 01/27/21 2302  AST 23  ALT 22  ALKPHOS 71  BILITOT 0.5  PROT 6.8  ALBUMIN 3.3*    Coagulation Profile: Recent Labs  Lab 01/30/21 0313  INR 0.9    HbA1C: No results for input(s): HGBA1C in the last 72 hours.  CBG: Recent Labs  Lab 01/31/21 0725 01/31/21 1206 01/31/21 1703 01/31/21 2119 02/01/21 0613  GLUCAP 168*  131* 136* 164* 198*     Recent Results (from the past 240 hour(s))  Resp Panel by RT-PCR (Flu A&B, Covid) Nasopharyngeal Swab     Status: None   Collection Time: 01/28/21  5:34 PM   Specimen: Nasopharyngeal Swab; Nasopharyngeal(NP) swabs in vial transport medium  Result Value Ref Range Status   SARS Coronavirus 2 by RT PCR NEGATIVE NEGATIVE Final    Comment: (NOTE) SARS-CoV-2 target nucleic acids are NOT DETECTED.  The SARS-CoV-2 RNA is generally detectable in upper respiratory specimens during the acute phase of infection. The lowest concentration of SARS-CoV-2 viral copies this assay can detect is 138 copies/mL. A  negative result does not preclude SARS-Cov-2 infection and should not be used as the sole basis for treatment or other patient management decisions. A negative result may occur with  improper specimen collection/handling, submission of specimen other than nasopharyngeal swab, presence of viral mutation(s) within the areas targeted by this assay, and inadequate number of viral copies(<138 copies/mL). A negative result must be combined with clinical observations, patient history, and epidemiological information. The expected result is Negative.  Fact Sheet for Patients:  EntrepreneurPulse.com.au  Fact Sheet for Healthcare Providers:  IncredibleEmployment.be  This test is no t yet approved or cleared by the [place] FDA and  has been authorized for detection and/or diagnosis of SARS-CoV-2 by FDA under an Emergency Use Authorization (EUA). This EUA will remain  in effect (meaning this test can be used) for the duration of the COVID-19 declaration under Section 564(b)(1) of the Act, 21 U.S.C.section 360bbb-3(b)(1), unless the authorization is terminated  or revoked sooner.       Influenza A by PCR NEGATIVE NEGATIVE Final   Influenza B by PCR NEGATIVE NEGATIVE Final    Comment: (NOTE) The Xpert Xpress SARS-CoV-2/FLU/RSV plus  assay is intended as an aid in the diagnosis of influenza from Nasopharyngeal swab specimens and should not be used as a sole basis for treatment. Nasal washings and aspirates are unacceptable for Xpert Xpress SARS-CoV-2/FLU/RSV testing.  Fact Sheet for Patients: EntrepreneurPulse.com.au  Fact Sheet for Healthcare Providers: IncredibleEmployment.be  This test is not yet approved or cleared by the [place] FDA and has been authorized for detection and/or diagnosis of SARS-CoV-2 by FDA under an Emergency Use Authorization (EUA). This EUA will remain in effect (meaning this test can be used) for the duration of the COVID-19 declaration under Section 564(b)(1) of the Act, 21 U.S.C. section 360bbb-3(b)(1), unless the authorization is terminated or revoked.  Performed at [facility], [place] [address], [place], [place] [address]   Surgical PCR screen     Status: Abnormal   Collection Time: 01/29/21  3:32 AM   Specimen: Nasal Mucosa; Nasal Swab  Result Value Ref Range Status   MRSA, PCR NEGATIVE NEGATIVE Final   Staphylococcus aureus POSITIVE (A) NEGATIVE Final    Comment: (NOTE) The Xpert SA Assay (FDA approved for NASAL specimens in patients 33 years of age and older), is one component of a comprehensive surveillance program. It is not intended to diagnose infection nor to guide or monitor treatment. Performed at [facility], [place] [address] [place], [place] [address]       Radiology Studies: No results found.    Marzetta Board, MD, PhD [facility]  Between 7 am - 7 pm I am available, please contact me via Amion (for emergencies) or Securechat (non urgent messages)  Between 7 pm - 7 am I am not available, please contact night coverage MD/APP via Amion

## 2021-02-02 LAB — CBC
HCT: 28.2 % — ABNORMAL LOW (ref 39.0–52.0)
Hemoglobin: 9.3 g/dL — ABNORMAL LOW (ref 13.0–17.0)
MCH: 31 pg (ref 26.0–34.0)
MCHC: 33 g/dL (ref 30.0–36.0)
MCV: 94 fL (ref 80.0–100.0)
Platelets: 239 10*3/uL (ref 150–400)
RBC: 3 MIL/uL — ABNORMAL LOW (ref 4.22–5.81)
RDW: 14 % (ref 11.5–15.5)
WBC: 11.7 10*3/uL — ABNORMAL HIGH (ref 4.0–10.5)
nRBC: 0 % (ref 0.0–0.2)

## 2021-02-02 LAB — GLUCOSE, CAPILLARY
Glucose-Capillary: 99 mg/dL (ref 70–99)
Glucose-Capillary: 155 mg/dL — ABNORMAL HIGH (ref 70–99)
Glucose-Capillary: 125 mg/dL — ABNORMAL HIGH (ref 70–99)
Glucose-Capillary: 152 mg/dL — ABNORMAL HIGH (ref 70–99)

## 2021-02-02 LAB — BASIC METABOLIC PANEL
Anion gap: 7 (ref 5–15)
BUN: 19 mg/dL (ref 6–20)
CO2: 20 mmol/L — ABNORMAL LOW (ref 22–32)
Calcium: 8.3 mg/dL — ABNORMAL LOW (ref 8.9–10.3)
Chloride: 110 mmol/L (ref 98–111)
Creatinine, Ser: 2.2 mg/dL — ABNORMAL HIGH (ref 0.61–1.24)
GFR, Estimated: 34 mL/min — ABNORMAL LOW (ref >60)
Glucose, Bld: 209 mg/dL — ABNORMAL HIGH (ref 70–99)
Potassium: 3.8 mmol/L (ref 3.5–5.1)
Sodium: 137 mmol/L (ref 135–145)

## 2021-02-02 NOTE — Progress Notes (Signed)
Physical Therapy Treatment Patient Details Name: Bobby Branch MRN: [id number] DOB: 04/05/1960 Today's Date: 02/02/2021   History of Present Illness 60 yo male presents to [facility] on 12/25 with HTN, R-sided scalp numbness, headache. CTA demonstrates severe L ICA stenosis. s/p bilat common carotid arteriogram with L ICA stent placement 12/28. 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    PT Comments    Pt progressing well toward goals, still needs short stent of PT at SNF before safe enough to be Mod I at home with AD/rollator.  Emphasis on donning underwear, sit to stand, static standing balance and progression of gait stability with various levels of assist/AD.   Recommendations for follow up therapy are one component of a multi-disciplinary discharge planning process, led by the attending physician.  Recommendations may be updated based on patient status, additional functional criteria and insurance authorization.  Follow Up Recommendations  Skilled nursing-short term rehab (<3 hours/day)     Assistance Recommended at Discharge Frequent or constant Supervision/Assistance  Equipment Recommendations  Rollator (4 wheels)    Recommendations for Other Services       Precautions / Restrictions Precautions Precautions: Fall     Mobility  Bed Mobility Overal bed mobility: Needs Assistance Bed Mobility: Supine to Sit   Sidelying to sit: Min guard            Transfers Overall transfer level: Needs assistance Equipment used: None   Sit to Stand: Min guard           General transfer comment: cues for hand placement, no assist, pt slow and guard for rise to standing.    Ambulation/Gait Ambulation/Gait assistance: Min assist;Min guard Gait Distance (Feet): 300 Feet Assistive device: IV Pole;None Gait Pattern/deviations: Step-through pattern;Decreased step length - right;Decreased step length -  left;Decreased stance time - left   Gait velocity interpretation: 1.31 - 2.62 ft/sec, indicative of limited community ambulator   General Gait Details: cues for upright posture.  pt mildly paretic with mild contralateral hip drop R hip. Noticeable mild degradation of gait with fatigue and after cues to scan his environment.  Likely will need AD/rollator to be safe enough to live alone.   Stairs             Wheelchair Mobility    Modified Rankin (Stroke Patients Only) Modified Rankin (Stroke Patients Only) Modified Rankin: Moderately severe disability     Balance Overall balance assessment: Needs assistance;History of Falls Sitting-balance support: Feet supported;No upper extremity supported Sitting balance-Leahy Scale: Good Sitting balance - Comments: able to donn underwear   Standing balance support: No upper extremity supported;Single extremity supported;During functional activity Standing balance-Leahy Scale: Fair Standing balance comment: deviation and mild LOB when not assisted externally or not holding to stationary surface/device                            Cognition Arousal/Alertness: Awake/alert Behavior During Therapy: WFL for tasks assessed/performed Overall Cognitive Status: No family/caregiver present to determine baseline cognitive functioning (likely close to baseline)                                          Exercises      General Comments        Pertinent Vitals/Pain Pain Assessment: Faces Faces Pain Scale: No  hurt Pain Intervention(s): Monitored during session    Home Living                          Prior Function            PT Goals (current goals can now be found in the care plan section) Acute Rehab PT Goals Patient Stated Goal: to get stronger and work toward going home, agreeable to [facility] if he needs to PT Goal Formulation: With patient Time For Goal Achievement: 02/12/21 Potential to Achieve  Goals: Good Progress towards PT goals: Progressing toward goals    Frequency    Min 3X/week      PT Plan Current plan remains appropriate    Co-evaluation              AM-PAC PT "6 Clicks" Mobility   Outcome Measure  Help needed turning from your back to your side while in a flat bed without using bedrails?: A Little Help needed moving from lying on your back to sitting on the side of a flat bed without using bedrails?: A Little Help needed moving to and from a bed to a chair (including a wheelchair)?: A Little Help needed standing up from a chair using your arms (e.g., wheelchair or bedside chair)?: A Little Help needed to walk in hospital room?: A Little Help needed climbing 3-5 steps with a railing? : Total 6 Click Score: 16    End of Session Equipment Utilized During Treatment: Gait belt Activity Tolerance: Patient tolerated treatment well;Patient limited by fatigue Patient left: in bed;with call bell/phone within reach;with bed alarm set Nurse Communication: Mobility status PT Visit Diagnosis: Other abnormalities of gait and mobility (R26.89);Hemiplegia and hemiparesis Hemiplegia - Right/Left: Left Hemiplegia - dominant/non-dominant: Non-dominant Hemiplegia - caused by: Cerebral infarction     Time: 5465-6812 PT Time Calculation (min) (ACUTE ONLY): 18 min  Charges:  $Gait Training: 8-22 mins                     02/02/2021  Ginger Carne., PT Acute Rehabilitation Services [phone]  (pager) [phone]  (office)   Tessie Fass Abbott Jasinski 02/02/2021, 1:07 PM

## 2021-02-02 NOTE — Progress Notes (Signed)
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  MRI of the brain showed no acute abnormalities, chronic infarction right MCA and ACA territory.  CT angiogram showed severe stenosis of the left ICA, status post stenting.  Awaiting SNF  Subjective / 24h Interval events: No significant complaints this morning  Assessment & Plan: Principal Problem TIA - presented with right-sided scalp tingling, patient with history of previous CVA.  Neurology consulted and following.  Imaging on admission was negative for acute CVA.  CT angiogram showed severe stenosis of left ICA as well as right carotid artery stenting with intra stent thrombus occupying 50% of the lumen.  2D echo showed EF 60 to 65%, grade 1 DD.  LDL is 114.  A1c 6.5.  Currently on Brilinta and aspirin.  Underwent cerebral angiogram, status post left ICA stent placement.  He already had a right ICA stented in May.  Current recommendations are for SNF, placement pending  Active Problems Bilateral carotid stenosis status post right carotid stenting -as above  Hypertension-continue Coreg, losartan, hydralazine.  Overall stable  Diabetes mellitus type 2 with hyperglycemia and hypoglycemia -A1c 6.5.  Continue CBGs with SSI  Hyperlipidemia -LDL 114.  Continue Lipitor  CKD stage IIIb -Baseline creatinine 2-2.4.  Creatinine stable this morning, 2.2.  Continue to monitor.   History of polysubstance abuse -Denies recent cocaine use.  Urine drug screen negative   Depression -Continue Cymbalta  Scheduled Meds:  aspirin  81 mg Oral Daily   Or   aspirin  81 mg Per Tube Daily   atorvastatin  80 mg Oral Daily   carvedilol  12.5  mg Oral BID WC   Chlorhexidine Gluconate Cloth  6 each Topical Q0600   diclofenac Sodium  2 g Topical QID   DULoxetine  60 mg Oral Daily   enoxaparin (LOVENOX) injection  30 mg Subcutaneous Q24H   famotidine  20 mg Oral Daily   fentaNYL (SUBLIMAZE) injection  12.5 mcg Intravenous Once   hydrALAZINE  50 mg Oral BID   insulin aspart  0-9 Units Subcutaneous TID WC   losartan  25 mg Oral BID   multivitamin with minerals  1 tablet Oral Daily   mupirocin ointment  1 application Nasal BID   nicotine  21 mg Transdermal Daily   sodium chloride flush  3 mL Intravenous Q12H   ticagrelor  90 mg Oral BID   Or   ticagrelor  90 mg Per Tube BID   tiZANidine  2 mg Oral TID   Continuous Infusions:  sodium chloride     sodium chloride 75 mL/hr at 02/02/21 0640   PRN Meds:.sodium chloride, acetaminophen **OR** acetaminophen (TYLENOL) oral liquid 160 mg/5 mL **OR** acetaminophen, albuterol, hydrALAZINE, polyethylene glycol, senna-docusate, sodium chloride flush  Diet Orders (From admission, onward)     Start     Ordered   01/31/21 0615  Diet Carb Modified Fluid consistency: Thin; Room service appropriate? Yes  Diet effective now       Question Answer Comment  Diet-HS Snack? Nothing   Calorie Level Medium 1600-2000   Fluid consistency: Thin   Room service appropriate? Yes      01/31/21 [phone]  DVT prophylaxis: enoxaparin (LOVENOX) injection 30 mg Start: 01/29/21 1830 SCD's Start: 01/28/21 1401     Code Status: Full Code  Family Communication: no family at bedside   Status is: Inpatient  Remains inpatient appropriate because: SNF placement pending  Level of care: Progressive  Consultants:  Neurology   Procedures:  none  Microbiology  none  Antimicrobials: none    Objective: Vitals:   02/01/21 2000 02/02/21 0000 02/02/21 0400 02/02/21 0758  BP: (!) 161/78 138/76 (!) 144/78 (!) 168/78  Pulse: 86 78 80 78  Resp: 18 18 18 17   Temp: 97.8 F (36.6 C) 97.9 F  (36.6 C) 97.9 F (36.6 C) 99.2 F (37.3 C)  TempSrc: Oral Oral Oral Oral  SpO2: 100% 99% 100% 100%  Weight:      Height:        Intake/Output Summary (Last 24 hours) at 02/02/2021 1128 Last data filed at 02/02/2021 0758 Gross per 24 hour  Intake 123 ml  Output 825 ml  Net -702 ml    Filed Weights   01/27/21 2214  Weight: 62.1 kg    Examination:  Constitutional: NAD Respiratory: CTA Cardiovascular: RRR, no edema  Data Reviewed: I have independently reviewed following labs and imaging studies   CBC: Recent Labs  Lab 01/27/21 2302 01/30/21 0313 01/31/21 0115 02/02/21 0146  WBC 8.0 6.1 14.3* 11.7*  NEUTROABS 5.0 3.2 12.5*  --   HGB 13.1 11.8* 10.6* 9.3*  HCT 39.7 34.7* 32.1* 28.2*  MCV 95.7 91.6 94.4 94.0  PLT 339 296 273 323    Basic Metabolic Panel: Recent Labs  Lab 01/27/21 2302 01/30/21 0313 01/31/21 0115 02/02/21 0146  NA 139 136 132* 137  K 4.3 4.1 4.4 3.8  CL 107 104 107 110  CO2 27 23 20* 20*  GLUCOSE 74 74 197* 209*  BUN 19 28* 24* 19  CREATININE 2.03* 2.60* 2.40* 2.20*  CALCIUM 8.9 8.5* 7.8* 8.3*  MG  --  2.0  --   --     Liver Function Tests: Recent Labs  Lab 01/27/21 2302  AST 23  ALT 22  ALKPHOS 71  BILITOT 0.5  PROT 6.8  ALBUMIN 3.3*    Coagulation Profile: Recent Labs  Lab 01/30/21 0313  INR 0.9    HbA1C: No results for input(s): HGBA1C in the last 72 hours.  CBG: Recent Labs  Lab 02/01/21 0613 02/01/21 1328 02/01/21 1550 02/01/21 2253 02/02/21 0706  GLUCAP 198* 144* 173* 168* 99     Recent Results (from the past 240 hour(s))  Resp Panel by RT-PCR (Flu A&B, Covid) Nasopharyngeal Swab     Status: None   Collection Time: 01/28/21  5:34 PM   Specimen: Nasopharyngeal Swab; Nasopharyngeal(NP) swabs in vial transport medium  Result Value Ref Range Status   SARS Coronavirus 2 by RT PCR NEGATIVE NEGATIVE Final    Comment: (NOTE) SARS-CoV-2 target nucleic acids are NOT DETECTED.  The SARS-CoV-2 RNA is  generally detectable in upper respiratory specimens during the acute phase of infection. The lowest concentration of SARS-CoV-2 viral copies this assay can detect is 138 copies/mL. A negative result does not preclude SARS-Cov-2 infection and should not be used as the sole basis for treatment or other patient management decisions. A negative result may occur with  improper specimen collection/handling, submission of specimen other than nasopharyngeal swab, presence of viral mutation(s) within the areas targeted by this assay, and inadequate number of viral copies(<138 copies/mL). A negative result must be combined with clinical observations,  patient history, and epidemiological information. The expected result is Negative.  Fact Sheet for Patients:  EntrepreneurPulse.com.au  Fact Sheet for Healthcare Providers:  IncredibleEmployment.be  This test is no t yet approved or cleared by the [place] FDA and  has been authorized for detection and/or diagnosis of SARS-CoV-2 by FDA under an Emergency Use Authorization (EUA). This EUA will remain  in effect (meaning this test can be used) for the duration of the COVID-19 declaration under Section 564(b)(1) of the Act, 21 U.S.C.section 360bbb-3(b)(1), unless the authorization is terminated  or revoked sooner.       Influenza A by PCR NEGATIVE NEGATIVE Final   Influenza B by PCR NEGATIVE NEGATIVE Final    Comment: (NOTE) The Xpert Xpress SARS-CoV-2/FLU/RSV plus assay is intended as an aid in the diagnosis of influenza from Nasopharyngeal swab specimens and should not be used as a sole basis for treatment. Nasal washings and aspirates are unacceptable for Xpert Xpress SARS-CoV-2/FLU/RSV testing.  Fact Sheet for Patients: EntrepreneurPulse.com.au  Fact Sheet for Healthcare Providers: IncredibleEmployment.be  This test is not yet approved or cleared by the [place] FDA and has been authorized for detection and/or diagnosis of SARS-CoV-2 by FDA under an Emergency Use Authorization (EUA). This EUA will remain in effect (meaning this test can be used) for the duration of the COVID-19 declaration under Section 564(b)(1) of the Act, 21 U.S.C. section 360bbb-3(b)(1), unless the authorization is terminated or revoked.  Performed at [facility], [place] [address] [place], [place] [address]   Surgical PCR screen     Status: Abnormal   Collection Time: 01/29/21  3:32 AM   Specimen: Nasal Mucosa; Nasal Swab  Result Value Ref Range Status   MRSA, PCR NEGATIVE NEGATIVE Final   Staphylococcus aureus POSITIVE (A) NEGATIVE Final    Comment: (NOTE) The Xpert SA Assay (FDA approved for NASAL specimens in patients 52 years of age and older), is one component of a comprehensive surveillance program. It is not intended to diagnose infection nor to guide or monitor treatment. Performed at [facility], [place] [address], [place], [place] [address]       Radiology Studies: No results found.    Marzetta Board, MD, PhD [facility]  Between 7 am - 7 pm I am available, please contact me via Amion (for emergencies) or Securechat (non urgent messages)  Between 7 pm - 7 am I am not available, please contact night coverage MD/APP via Amion

## 2021-02-03 DIAGNOSIS — E1165 Type 2 diabetes mellitus with hyperglycemia: Secondary | ICD-10-CM

## 2021-02-03 LAB — GLUCOSE, CAPILLARY
Glucose-Capillary: 125 mg/dL — ABNORMAL HIGH (ref 70–99)
Glucose-Capillary: 175 mg/dL — ABNORMAL HIGH (ref 70–99)
Glucose-Capillary: 107 mg/dL — ABNORMAL HIGH (ref 70–99)
Glucose-Capillary: 169 mg/dL — ABNORMAL HIGH (ref 70–99)

## 2021-02-03 LAB — BASIC METABOLIC PANEL
Anion gap: 8 (ref 5–15)
BUN: 18 mg/dL (ref 6–20)
CO2: 21 mmol/L — ABNORMAL LOW (ref 22–32)
Calcium: 8.5 mg/dL — ABNORMAL LOW (ref 8.9–10.3)
Chloride: 111 mmol/L (ref 98–111)
Creatinine, Ser: 1.81 mg/dL — ABNORMAL HIGH (ref 0.61–1.24)
GFR, Estimated: 43 mL/min — ABNORMAL LOW (ref >60)
Glucose, Bld: 141 mg/dL — ABNORMAL HIGH (ref 70–99)
Potassium: 3.8 mmol/L (ref 3.5–5.1)
Sodium: 140 mmol/L (ref 135–145)

## 2021-02-03 MED ORDER — PHENOL 1.4 % MT LIQD
1.0000 | OROMUCOSAL | Status: DC | PRN
  Administered 2021-02-03: 1 via OROMUCOSAL
  Filled 2021-02-03: qty 177

## 2021-02-03 MED ORDER — ALUM & MAG HYDROXIDE-SIMETH 200-200-20 MG/5ML PO SUSP
15.0000 mL | ORAL | Status: DC | PRN
  Administered 2021-02-03 (×2): 15 mL via ORAL
  Filled 2021-02-03 (×2): qty 30

## 2021-02-03 MED ORDER — ALUM & MAG HYDROXIDE-SIMETH 200-200-20 MG/5ML PO SUSP
30.0000 mL | ORAL | Status: DC | PRN
  Filled 2021-02-03: qty 30

## 2021-02-03 NOTE — TOC Progression Note (Signed)
Transition of Care [facility]) - Progression Note    Patient Details  Name: Bobby Branch MRN: [id number] Date of Birth: November 09, 1960  Transition of Care [facility]) CM/SW [employer] [place] Phone Number: 02/03/2021, 11:38 AM  Clinical Narrative:    CSW attempted to provide bed offers to pt yesterday, but he was unavailable. Bed offers provided today, and pt requested CSW follow up later for his decision. CSW followed up with pt who stated that he does not want to go to a nursing home now. He says he wants to go back to his apartment with a [facility] aide. He states he did not want to be here this long and wants to sign himself out tomorrow. Pt was upset about the situation. CSW consulted CM to follow up and arrange home health needs.   Expected Discharge Plan: Skilled Nursing Facility Barriers to Discharge: Continued Medical Work up, Ship broker  Expected Discharge Plan and Services Expected Discharge Plan: [place]   Discharge Planning Services: CM Consult Post Acute Care Choice: [place] arrangements for the past 2 months: Apartment                                       Social Determinants of Health (SDOH) Interventions    Readmission Risk Interventions No flowsheet data found.

## 2021-02-03 NOTE — Progress Notes (Addendum)
Patient ID: Bobby Branch, male   DOB: 1960-09-25, 61 y.o.   MRN: [id number]  PROGRESS NOTE    Bobby Branch  XBD:[id number] DOB: 04-Nov-1960 DOA: 01/27/2021 PCP: Charlott Rakes, MD   Brief Narrative:  61 y.o. 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  On presentation, neurology was consulted.  CT of the head was negative for acute finding.  MRI of brain showed no acute abnormality, showed chronic cortical infarction right MCA and ACA territory.  CT angio of of head/neck showed severe stenosis of left ICA; right carotid artery stenting with involved IntraStent thrombus occupying 50% of lumen. He underwent left carotid artery stenting on 01/22/2021 by Dr. Estanislado Pandy.  PT recommending SNF placement.  Assessment & Plan:   Possible TIA in a patient with history of previous stroke with residual spastic hemiplegia -MRI of brain was negative for acute findings -Neurology signed off on 01/31/2021: Outpatient follow-up with neurology.  Continue aspirin, Brilinta and statin -Awaiting SNF placement.  Bilateral carotid stenosis status post right carotid stenting -Status post left carotid artery stenting on 01/22/2021 by Dr. Estanislado Pandy.    Essential hypertension -Continue Coreg, hydralazine, losartan.  Monitor blood pressure.  Diabetes mellitus type 2 with hyperglycemia and hypoglycemia insulin -A1c 6.5.  Continue CBGs with SSI  Hyperlipidemia - LDL 114.  Continue Lipitor  Chronic kidney disease stage IIIb  -Baseline creatinine 2-2.4.  Creatinine stable.  History of polysubstance abuse -Denies recent cocaine use.  Urine drug screen negative  Depression -Continue Cymbalta   DVT prophylaxis: SCDs Code Status: Full Family Communication: None at bedside Disposition Plan: Status is: Inpatient  Remains  inpatient appropriate because: Need for SNF placement.  Currently medically stable for discharge.  Consultants: Neurology/neuro-interventionist  Procedures: left carotid artery stenting on 01/22/2021 by Dr. Estanislado Pandy.    Antimicrobials: None   Subjective: Patient seen and examined at bedside.  Poor historian.  No fever, chest pain, vomiting reported. Objective: Vitals:   02/02/21 1742 02/02/21 2000 02/03/21 0000 02/03/21 0400  BP: (!) 177/95 (!) 159/83 (!) 154/88 (!) 151/80  Pulse: 82 75 74 71  Resp: 17 18 18 18   Temp: 98.1 F (36.7 C) 97.9 F (36.6 C) 98.1 F (36.7 C) 98.6 F (37 C)  TempSrc: Oral Oral Oral Oral  SpO2: 100% 100% 99% 97%  Weight:      Height:        Intake/Output Summary (Last 24 hours) at 02/03/2021 0801 Last data filed at 02/03/2021 0418 Gross per 24 hour  Intake 237 ml  Output 1700 ml  Net -1463 ml    Filed Weights   01/27/21 2214  Weight: 62.1 kg    Examination:  General exam: No distress.  On room air currently.  Looks chronically ill.  Poor historian.  Slow to respond. Respiratory system: Decreased breath sounds at bases bilaterally with no wheezing Cardiovascular system: Currently rate controlled; S1-S2 heard gastrointestinal system: Abdomen is distended slightly, soft and nontender.  Bowel sounds are heard  extremities: Mild lower extremity edema present; no clubbing  Data Reviewed: I have personally reviewed following labs and imaging studies  CBC: Recent Labs  Lab 01/27/21 2302 01/30/21 0313 01/31/21 0115 02/02/21 0146  WBC 8.0 6.1 14.3* 11.7*  NEUTROABS 5.0 3.2 12.5*  --   HGB 13.1 11.8* 10.6* 9.3*  HCT 39.7 34.7* 32.1* 28.2*  MCV 95.7 91.6 94.4 94.0  PLT  339 296 273 182    Basic Metabolic Panel: Recent Labs  Lab 01/27/21 2302 01/30/21 0313 01/31/21 0115 02/02/21 0146 02/03/21 0207  NA 139 136 132* 137 140  K 4.3 4.1 4.4 3.8 3.8  CL 107 104 107 110 111  CO2 27 23 20* 20* 21*  GLUCOSE 74 74 197* 209* 141*  BUN 19 28*  24* 19 18  CREATININE 2.03* 2.60* 2.40* 2.20* 1.81*  CALCIUM 8.9 8.5* 7.8* 8.3* 8.5*  MG  --  2.0  --   --   --     GFR: Estimated Creatinine Clearance: 38.7 mL/min (A) (by C-G formula based on SCr of 1.81 mg/dL (H)). Liver Function Tests: Recent Labs  Lab 01/27/21 2302  AST 23  ALT 22  ALKPHOS 71  BILITOT 0.5  PROT 6.8  ALBUMIN 3.3*    No results for input(s): LIPASE, AMYLASE in the last 168 hours. No results for input(s): AMMONIA in the last 168 hours. Coagulation Profile: Recent Labs  Lab 01/30/21 0313  INR 0.9   Cardiac Enzymes: No results for input(s): CKTOTAL, CKMB, CKMBINDEX, TROPONINI in the last 168 hours. BNP (last 3 results) No results for input(s): PROBNP in the last 8760 hours. HbA1C: No results for input(s): HGBA1C in the last 72 hours.  CBG: Recent Labs  Lab 02/02/21 0706 02/02/21 1223 02/02/21 1704 02/02/21 2222 02/03/21 0610  GLUCAP 99 155* 125* 152* 125*    Lipid Profile: No results for input(s): CHOL, HDL, LDLCALC, TRIG, CHOLHDL, LDLDIRECT in the last 72 hours.  Thyroid Function Tests: No results for input(s): TSH, T4TOTAL, FREET4, T3FREE, THYROIDAB in the last 72 hours. Anemia Panel: No results for input(s): VITAMINB12, FOLATE, FERRITIN, TIBC, IRON, RETICCTPCT in the last 72 hours. Sepsis Labs: No results for input(s): PROCALCITON, LATICACIDVEN in the last 168 hours.  Recent Results (from the past 240 hour(s))  Resp Panel by RT-PCR (Flu A&B, Covid) Nasopharyngeal Swab     Status: None   Collection Time: 01/28/21  5:34 PM   Specimen: Nasopharyngeal Swab; Nasopharyngeal(NP) swabs in vial transport medium  Result Value Ref Range Status   SARS Coronavirus 2 by RT PCR NEGATIVE NEGATIVE Final    Comment: (NOTE) SARS-CoV-2 target nucleic acids are NOT DETECTED.  The SARS-CoV-2 RNA is generally detectable in upper respiratory specimens during the acute phase of infection. The lowest concentration of SARS-CoV-2 viral copies this assay can  detect is 138 copies/mL. A negative result does not preclude SARS-Cov-2 infection and should not be used as the sole basis for treatment or other patient management decisions. A negative result may occur with  improper specimen collection/handling, submission of specimen other than nasopharyngeal swab, presence of viral mutation(s) within the areas targeted by this assay, and inadequate number of viral copies(<138 copies/mL). A negative result must be combined with clinical observations, patient history, and epidemiological information. The expected result is Negative.  Fact Sheet for Patients:  EntrepreneurPulse.com.au  Fact Sheet for Healthcare Providers:  IncredibleEmployment.be  This test is no t yet approved or cleared by the [place] FDA and  has been authorized for detection and/or diagnosis of SARS-CoV-2 by FDA under an Emergency Use Authorization (EUA). This EUA will remain  in effect (meaning this test can be used) for the duration of the COVID-19 declaration under Section 564(b)(1) of the Act, 21 U.S.C.section 360bbb-3(b)(1), unless the authorization is terminated  or revoked sooner.       Influenza A by PCR NEGATIVE NEGATIVE Final   Influenza B by PCR NEGATIVE  NEGATIVE Final    Comment: (NOTE) The Xpert Xpress SARS-CoV-2/FLU/RSV plus assay is intended as an aid in the diagnosis of influenza from Nasopharyngeal swab specimens and should not be used as a sole basis for treatment. Nasal washings and aspirates are unacceptable for Xpert Xpress SARS-CoV-2/FLU/RSV testing.  Fact Sheet for Patients: EntrepreneurPulse.com.au  Fact Sheet for Healthcare Providers: IncredibleEmployment.be  This test is not yet approved or cleared by the [place] FDA and has been authorized for detection and/or diagnosis of SARS-CoV-2 by FDA under an Emergency Use Authorization (EUA). This EUA will remain in  effect (meaning this test can be used) for the duration of the COVID-19 declaration under Section 564(b)(1) of the Act, 21 U.S.C. section 360bbb-3(b)(1), unless the authorization is terminated or revoked.  Performed at [facility], [place] [address], [place], [place] [address]   Surgical PCR screen     Status: Abnormal   Collection Time: 01/29/21  3:32 AM   Specimen: Nasal Mucosa; Nasal Swab  Result Value Ref Range Status   MRSA, PCR NEGATIVE NEGATIVE Final   Staphylococcus aureus POSITIVE (A) NEGATIVE Final    Comment: (NOTE) The Xpert SA Assay (FDA approved for NASAL specimens in patients 80 years of age and older), is one component of a comprehensive surveillance program. It is not intended to diagnose infection nor to guide or monitor treatment. Performed at [facility], [place] [address], [place], [place] [address]           Radiology Studies: No results found.      Scheduled Meds:  aspirin  81 mg Oral Daily   Or   aspirin  81 mg Per Tube Daily   atorvastatin  80 mg Oral Daily   carvedilol  12.5 mg Oral BID WC   Chlorhexidine Gluconate Cloth  6 each Topical Q0600   diclofenac Sodium  2 g Topical QID   DULoxetine  60 mg Oral Daily   enoxaparin (LOVENOX) injection  30 mg Subcutaneous Q24H   famotidine  20 mg Oral Daily   fentaNYL (SUBLIMAZE) injection  12.5 mcg Intravenous Once   hydrALAZINE  50 mg Oral BID   insulin aspart  0-9 Units Subcutaneous TID WC   losartan  25 mg Oral BID   multivitamin with minerals  1 tablet Oral Daily   nicotine  21 mg Transdermal Daily   sodium chloride flush  3 mL Intravenous Q12H   ticagrelor  90 mg Oral BID   Or   ticagrelor  90 mg Per Tube BID   tiZANidine  2 mg Oral TID   Continuous Infusions:  sodium chloride     sodium chloride 75 mL/hr at 02/02/21 2010          Aline August, MD [facility] 02/03/2021, 8:01 AM

## 2021-02-03 NOTE — Plan of Care (Signed)
  Problem: Education: Goal: Knowledge of General Education information will improve Description: Including pain rating scale, medication(s)/side effects and non-pharmacologic comfort measures Outcome: Progressing   Problem: Health Behavior/Discharge Planning: Goal: Ability to manage health-related needs will improve Outcome: Progressing   Problem: Clinical Measurements: Goal: Ability to maintain clinical measurements within normal limits will improve Outcome: Progressing Goal: Will remain free from infection Outcome: Progressing Goal: Diagnostic test results will improve Outcome: Progressing Goal: Respiratory complications will improve Outcome: Progressing Goal: Cardiovascular complication will be avoided Outcome: Progressing   Problem: Activity: Goal: Risk for activity intolerance will decrease Outcome: Progressing   Problem: Nutrition: Goal: Adequate nutrition will be maintained Outcome: Progressing   Problem: Coping: Goal: Level of anxiety will decrease Outcome: Progressing   Problem: Elimination: Goal: Will not experience complications related to bowel motility Outcome: Progressing Goal: Will not experience complications related to urinary retention Outcome: Progressing   Problem: Pain Managment: Goal: General experience of comfort will improve Outcome: Progressing   Problem: Safety: Goal: Ability to remain free from injury will improve Outcome: Progressing   Problem: Skin Integrity: Goal: Risk for impaired skin integrity will decrease Outcome: Progressing   Problem: Education: Goal: Knowledge of disease or condition will improve Outcome: Progressing Goal: Knowledge of secondary prevention will improve (SELECT ALL) Outcome: Progressing Goal: Knowledge of patient specific risk factors will improve (INDIVIDUALIZE FOR PATIENT) Outcome: Progressing Goal: Individualized Educational Video(s) Outcome: Progressing   Problem: Coping: Goal: Will verbalize  positive feelings about self Outcome: Progressing Goal: Will identify appropriate support needs Outcome: Progressing   Problem: Health Behavior/Discharge Planning: Goal: Ability to manage health-related needs will improve Outcome: Progressing   Problem: Self-Care: Goal: Ability to participate in self-care as condition permits will improve Outcome: Progressing Goal: Verbalization of feelings and concerns over difficulty with self-care will improve Outcome: Progressing Goal: Ability to communicate needs accurately will improve Outcome: Progressing   Problem: Nutrition: Goal: Risk of aspiration will decrease Outcome: Progressing Goal: Dietary intake will improve Outcome: Progressing   Problem: Ischemic Stroke/TIA Tissue Perfusion: Goal: Complications of ischemic stroke/TIA will be minimized Outcome: Progressing   

## 2021-02-04 ENCOUNTER — Other Ambulatory Visit: Payer: Self-pay

## 2021-02-04 DIAGNOSIS — N1832 Chronic kidney disease, stage 3b: Secondary | ICD-10-CM

## 2021-02-04 LAB — GLUCOSE, CAPILLARY
Glucose-Capillary: 152 mg/dL — ABNORMAL HIGH (ref 70–99)
Glucose-Capillary: 120 mg/dL — ABNORMAL HIGH (ref 70–99)

## 2021-02-04 MED ORDER — ATORVASTATIN CALCIUM 80 MG PO TABS
80.0000 mg | ORAL_TABLET | Freq: Every day | ORAL | 0 refills | Status: DC
  Filled 2021-02-04: qty 30, 30d supply, fill #0

## 2021-02-04 MED ORDER — TICAGRELOR 90 MG PO TABS
90.0000 mg | ORAL_TABLET | Freq: Two times a day (BID) | ORAL | 0 refills | Status: DC
  Filled 2021-02-04: qty 60, 30d supply, fill #0

## 2021-02-04 MED ORDER — HYDRALAZINE HCL 50 MG PO TABS: 50.0000 mg | ORAL_TABLET | Freq: Two times a day (BID) | ORAL | Status: DC

## 2021-02-04 MED ORDER — LOSARTAN POTASSIUM 25 MG PO TABS: 25.0000 mg | ORAL_TABLET | Freq: Two times a day (BID) | ORAL | Status: DC

## 2021-02-04 NOTE — Discharge Summary (Signed)
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 under fluoroscopic guidance. A control arteriogram was then performed at 15 and 30 minutes post second angioplasty. These continued to demonstrate excellent apposition of flow through the stented segment of the proximal left internal carotid artery without evidence of intraluminal filling defects extra cranially or intracranially. Balloon guide was then removed. Hemostasis at the right groin puncture site was obtained using a pre clot device with manual compression for approximately 20 minutes. Distal pulses remained palpable in both feet. A flat panel CT of the brain demonstrated no evidence of intracranial hemorrhage or mass effect or midline shift. Patient was then extubated without difficulty. Upon recovery, the patient denied any nausea, vomiting or headaches. He was able to move  his right arm and leg without any difficulty. There was no change in the mild left facial droop from the previous ischemic stroke. The patient was then transferred to neuro ICU for post revascularization care. The patient's overnight stay was unremarkable. The patient complained of a frontal headache which responded to Tylenol, and low-dose IV fentanyl. The following morning, the IV heparin was stopped and the patient was switched to aspirin 81 mg a day and Brilinta 90 mg b.i.d. The right groin appeared soft.  Distal pulses were palpable. Neurologically, the patient had returned to his preprocedural status. He continued to have power in his left upper extremity, and a 3+ to 4/5 power in the left lower extremity. The right side appeared 5/5 in the upper and lower extremities. Patient appeared alert, awake, oriented to time, place, space. He was then transferred to the admitting service. 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 PLAN: Patient to be followed in the clinic in approximately 2-3 weeks time post discharge. Patient advised to continue on aspirin 81 mg and Brilinta 90 mg b.i.d. for a month. At that time the patient will be switched to aspirin 81 mg, and Plavix 75 mg a day for at least 6 months.  Patient expressed understanding and agreement with the above management plan. Electronically Signed   By: Luanne Bras M.D.   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 Progressive severe stenosis of the left internal carotid proximally associated with an ulcerated plaque. Presented with history of right facial scalp tingling. EXAM: INTRAVSC STENT CERV CAROTID W/ EMB-PROT COMPARISON:  CT angiogram of the head and  neck 01/28/2021 and MRI of brain of 01/28/2021. MEDICATIONS: Heparin 3,000 units IV. Ancef 2 g IV antibiotic was administered within 1 hour of the procedure. ANESTHESIA/SEDATION: General anesthesia. CONTRAST:  Omnipaque 300 approximately 70 mL. FLUOROSCOPY TIME:  Fluoroscopy Time: 26 minutes 1 seconds (1526 mGy). COMPLICATIONS: None immediate. TECHNIQUE: Informed written consent was obtained from the patient after a thorough discussion of the procedural risks, benefits and alternatives. All questions were addressed. Maximal Sterile Barrier Technique was utilized including caps, mask, sterile gowns, sterile gloves, sterile drape, hand hygiene and skin antiseptic. A timeout was performed prior to the initiation of the procedure. The right groin was prepped and draped in the usual sterile fashion. Thereafter using modified Seldinger technique, transfemoral access into the right common femoral artery was obtained without difficulty. Over a 0.035 inch guidewire, an 8 French 25 Pinnacle sheath was inserted. Through this, and also over 0.035 inch guidewire, a 5 [place] JB 1 catheter was advanced to the aortic arch region and selectively positioned in the right common carotid artery and the left common carotid artery. FINDINGS: The right common carotid arteriogram demonstrates the right external carotid artery and its major branches to be widely patent. The right internal carotid artery in the stented segment demonstrates approximately 25% stenosis by the NASCET criteria of the midportion of the previously positioned stent. More distally, the vessel is seen to opacify to the cranial skull base. The petrous, the cavernous and the supraclinoid segments are widely patent with mild arteriosclerotic changes in the proximal cavernous segment. Right posterior communicating artery is seen opacifying the right posterior cerebral artery distribution. The right middle cerebral artery and the right anterior cerebral artery opacify into  the capillary and venous phases. Prompt cross-filling via the anterior communicating artery of the left anterior cerebral artery A2 segment is noted. The left common carotid arteriogram demonstrates eccentric plaque in the mid to distal 1/3 of the left common carotid artery with approximately 20% stenosis. Distal to this, the left external carotid artery and its major branches demonstrate wide patency. The left internal carotid artery at the bulb and just distally demonstrates a long segment severe stenosis associated with a moderate-size ulcerated plaque along the inferoposterior wall at the level of the bulb. No evidence of intraluminal filling defect is demonstrated. More distally, the vessel is seen to opacify to the cranial skull base. The petrous, the cavernous and the supraclinoid segments are patent. Noted is a moderate stenosis at the supraclinoid left ICA. The left middle cerebral artery opacifies into the capillary and the venous phases. The partially opacified left anterior cerebral A1 segment demonstrates wide patency. Mixing of unopacified blood is seen from the contralateral right internal carotid artery via the anterior communicating artery as mentioned above. ENDOVASCULAR REVASCULARIZATION OF SYMPTOMATIC HIGH-GRADE STENOSIS OF THE PROXIMAL LEFT INTERNAL CAROTID ARTERY. The diagnostic catheter in the left common carotid artery was exchanged over a 0.035 inch 300 cm Rosen exchange wire for a 95 cm 087 balloon  guide catheter which had been prepped with 50% contrast and 50% heparinized saline infusion. Guidewire was removed. Good aspiration obtained from the hub of the balloon guide catheter. An arteriogram was then performed through the balloon guide catheter which demonstrated no change in the extracranial or intracranial circulation. A 4-7 mm NAV Emboshield device was then prepped and purged with heparinized saline infusion antegradely and retrogradely in its housing. The filter was then captured  into the delivery microcatheter. The combination of the 014 inch micro guidewire with the delivery microcatheter was then removed. A moderate J configuration was given to the distal wire. Thereafter, using biplane roadmap technique and constant fluoroscopic guidance using continuous heparinized saline infusion, the Emboshield delivery microcatheter was then advanced to the distal end of the balloon guide catheter. Using a torque device, the wire was gently advanced through the severe stenosis unhindered followed by the delivery microcatheter to the cervical petrous junction. The filter was then deployed in the usual manner without difficulty. The delivery microcatheter of the filter was removed under fluoroscopic surveillance. A control arteriogram performed through the balloon guide catheter demonstrated no change in the extracranial or intracranial circulation. At this time, a 4 x 30 mm Viatrac 14 balloon angioplasty catheter was prepped and purged with heparinized saline infusion retrogradely and with 50% contrast and 50% heparinized saline infusion antegradely. Using the rapid exchange technique, the balloon was then advanced and positioned adequate distance from the site of the severe stenosis. Control inflation was then performed using micro inflation syringe device via micro tubing to 4 mm where it was maintained for approximately 40 seconds. During this time no significant hemodynamic or heart rate changes were evident. Balloon was deflated and retrieved and removed. A control arteriogram performed through the balloon guide catheter now demonstrated modestly improved caliber of the proximal left internal carotid stenosis. Measurements were then performed of left internal carotid artery distal to the stenosis, and also in the left common carotid artery. It was decided to proceed with placement of a 6/8 mm x 40 mm Xact stent. The stent delivery system was then prepped retrogradely with heparinized saline  infusion. Thereafter, using a rapid exchange technique, the stent delivery apparatus was advanced and positioned with the distal and proximal markers covering the entire segment of severe angioplasty stenosis. The stent was deployed in the usual manner without any complications. The delivery apparatus was removed. Control arteriogram performed through the balloon guide now demonstrated excellent apposition with improved flow extra cranially and intracranially. A 5 mm x 30 mm Viatrac 14 balloon angioplasty catheter was then prepped and purged as described earlier in the usual manner. Again using the rapid exchange technique, this was advanced into the stent. A control inflation was then performed again using micro inflation syringe device via micro tubing to approximately 5 mm where it was maintained for approximately 30 seconds. The balloon was then deflated and retrieved and removed. A control arteriogram performed through the balloon guide catheter demonstrated excellent apposition with significantly improved caliber of the stented segment. At this time the filter capture device was then again advanced using the rapid exchange technique under fluoroscopic guidance. The filter device was then captured under fluoroscopic guidance into the filter device without difficulty. The system was removed under fluoroscopic guidance. A control arteriogram was then performed at 15 and 30 minutes post second angioplasty. These continued to demonstrate excellent apposition of flow through the stented segment of the proximal left internal carotid artery without evidence of intraluminal filling defects extra cranially  or intracranially. Balloon guide was then removed. Hemostasis at the right groin puncture site was obtained using a pre clot device with manual compression for approximately 20 minutes. Distal pulses remained palpable in both feet. A flat panel CT of the brain demonstrated no evidence of intracranial hemorrhage or  mass effect or midline shift. Patient was then extubated without difficulty. Upon recovery, the patient denied any nausea, vomiting or headaches. He was able to move his right arm and leg without any difficulty. There was no change in the mild left facial droop from the previous ischemic stroke. The patient was then transferred to neuro ICU for post revascularization care. The patient's overnight stay was unremarkable. The patient complained of a frontal headache which responded to Tylenol, and low-dose IV fentanyl. The following morning, the IV heparin was stopped and the patient was switched to aspirin 81 mg a day and Brilinta 90 mg b.i.d. The right groin appeared soft.  Distal pulses were palpable. Neurologically, the patient had returned to his preprocedural status. He continued to have power in his left upper extremity, and a 3+ to 4/5 power in the left lower extremity. The right side appeared 5/5 in the upper and lower extremities. Patient appeared alert, awake, oriented to time, place, space. He was then transferred to the admitting service. 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 PLAN: Patient to be followed in the clinic in approximately 2-3 weeks time post discharge. Patient advised to continue on aspirin 81 mg and Brilinta 90 mg b.i.d. for a month. At that time the patient will be switched to aspirin 81 mg, and Plavix 75 mg a day for at least 6 months. Patient expressed understanding and agreement with the above management plan. Electronically Signed   By: Luanne Bras M.D.   On: 02/04/2021 09:06   ECHOCARDIOGRAM COMPLETE  Result Date: 01/29/2021    ECHOCARDIOGRAM REPORT   Patient Name:   LIBERATO STANSBERY Date of Exam: 01/29/2021 Medical Rec #:  [id number]    Height:       65.0 in Accession #:    [id number]   Weight:       137.0 lb Date of Birth:  Jun 08, 1960    BSA:           1.684 m Patient Age:    15 years     BP:           148/94 mmHg Patient Gender: M            HR:           81 bpm. Exam Location:  Inpatient Procedure: 2D Echo, Cardiac Doppler, Color Doppler, 3D Echo and Strain Analysis Indications:    Stroke  History:        Patient has prior history of Echocardiogram examinations, most                 recent 04/20/2020. Risk Factors:Hypertension and Diabetes.  Sonographer:    Glo Herring Referring Phys: [id number] [place]  1. Left ventricular ejection fraction, by estimation, is 60 to 65%. The left ventricle has normal function. The left ventricle has no regional wall motion abnormalities. There is mild left ventricular hypertrophy. Left ventricular diastolic parameters are consistent with Grade I diastolic dysfunction (impaired relaxation). The average left ventricular global longitudinal strain is -17.1 %. The global longitudinal strain is normal.  2. Right ventricular systolic function is normal. The right ventricular size  is normal.  3. The mitral valve is normal in structure. Trivial mitral valve regurgitation. No evidence of mitral stenosis.  4. The aortic valve is tricuspid. Aortic valve regurgitation is not visualized. Aortic valve sclerosis/calcification is present, without any evidence of aortic stenosis.  5. The inferior vena cava is normal in size with greater than 50% respiratory variability, suggesting right atrial pressure of 3 mmHg. FINDINGS  Left Ventricle: Left ventricular ejection fraction, by estimation, is 60 to 65%. The left ventricle has normal function. The left ventricle has no regional wall motion abnormalities. The average left ventricular global longitudinal strain is -17.1 %. The global longitudinal strain is normal. The left ventricular internal cavity size was normal in size. There is mild left ventricular hypertrophy. Left ventricular diastolic parameters are consistent with Grade I diastolic dysfunction (impaired relaxation). Right  Ventricle: The right ventricular size is normal. No increase in right ventricular wall thickness. Right ventricular systolic function is normal. Left Atrium: Left atrial size was normal in size. Right Atrium: Right atrial size was normal in size. Pericardium: There is no evidence of pericardial effusion. Mitral Valve: The mitral valve is normal in structure. There is mild thickening of the mitral valve leaflet(s). Trivial mitral valve regurgitation. No evidence of mitral valve stenosis. Tricuspid Valve: The tricuspid valve is normal in structure. Tricuspid valve regurgitation is not demonstrated. No evidence of tricuspid stenosis. Aortic Valve: The aortic valve is tricuspid. Aortic valve regurgitation is not visualized. Aortic valve sclerosis/calcification is present, without any evidence of aortic stenosis. Aortic valve mean gradient measures 3.0 mmHg. Aortic valve peak gradient measures 5.4 mmHg. Aortic valve area, by VTI measures 2.81 cm. Pulmonic Valve: The pulmonic valve was normal in structure. Pulmonic valve regurgitation is not visualized. No evidence of pulmonic stenosis. Aorta: The aortic root is normal in size and structure. Venous: The inferior vena cava is normal in size with greater than 50% respiratory variability, suggesting right atrial pressure of 3 mmHg. IAS/Shunts: No atrial level shunt detected by color flow Doppler.  LEFT VENTRICLE PLAX 2D LVIDd:         4.20 cm   Diastology LVIDs:         3.00 cm   LV e' medial:    5.22 cm/s LV PW:         1.20 cm   LV E/e' medial:  10.4 LV IVS:        1.20 cm   LV e' lateral:   7.51 cm/s LVOT diam:     2.00 cm   LV E/e' lateral: 7.2 LV SV:         67 LV SV Index:   40        2D Longitudinal Strain LVOT Area:     3.14 cm  2D Strain GLS Avg:     -17.1 %  RIGHT VENTRICLE             IVC RV Basal diam:  3.70 cm     IVC diam: 1.30 cm RV Mid diam:    2.80 cm RV S prime:     11.60 cm/s LEFT ATRIUM             Index        RIGHT ATRIUM           Index LA diam:         3.40 cm 2.02 cm/m   RA Area:     13.70 cm LA Vol (A2C):   35.2 ml 20.90 ml/m  RA Volume:   32.30 ml  19.18 ml/m LA Vol (A4C):   35.0 ml 20.78 ml/m LA Biplane Vol: 36.8 ml 21.85 ml/m  AORTIC VALVE                    PULMONIC VALVE AV Area (Vmax):    2.95 cm     PV Vmax:       1.17 m/s AV Area (Vmean):   2.57 cm     PV Peak grad:  5.5 mmHg AV Area (VTI):     2.81 cm AV Vmax:           116.00 cm/s AV Vmean:          82.600 cm/s AV VTI:            0.237 m AV Peak Grad:      5.4 mmHg AV Mean Grad:      3.0 mmHg LVOT Vmax:         109.00 cm/s LVOT Vmean:        67.600 cm/s LVOT VTI:          0.212 m LVOT/AV VTI ratio: 0.89  AORTA Ao Root diam: 2.90 cm MITRAL VALVE MV Area (PHT): 5.09 cm    SHUNTS MV Decel Time: 149 msec    Systemic VTI:  0.21 m MV E velocity: 54.20 cm/s  Systemic Diam: 2.00 cm MV A velocity: 95.70 cm/s MV E/A ratio:  0.57 Jenkins Rouge MD Electronically signed by Jenkins Rouge MD Signature Date/Time: 01/29/2021/3:34:05 PM    Final    IR ANGIO INTRA EXTRACRAN SEL COM CAROTID INNOMINATE BILAT MOD SED  Result Date: 02/04/2021 CLINICAL DATA:  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 Progressive severe stenosis of the left internal carotid proximally associated with an ulcerated plaque. Presented with history of right facial scalp tingling. EXAM: INTRAVSC STENT CERV CAROTID W/ EMB-PROT COMPARISON:  CT angiogram of the head and neck 01/28/2021 and MRI of brain of 01/28/2021. MEDICATIONS: Heparin 3,000 units IV. Ancef 2 g IV antibiotic was administered within 1 hour of the procedure. ANESTHESIA/SEDATION: General anesthesia. CONTRAST:  Omnipaque 300 approximately 70 mL. FLUOROSCOPY TIME:  Fluoroscopy Time: 26 minutes 1 seconds (1526 mGy). COMPLICATIONS: None immediate. TECHNIQUE: Informed written consent was obtained from the patient after a thorough discussion of the procedural risks, benefits and  alternatives. All questions were addressed. Maximal Sterile Barrier Technique was utilized including caps, mask, sterile gowns, sterile gloves, sterile drape, hand hygiene and skin antiseptic. A timeout was performed prior to the initiation of the procedure. The right groin was prepped and draped in the usual sterile fashion. Thereafter using modified Seldinger technique, transfemoral access into the right common femoral artery was obtained without difficulty. Over a 0.035 inch guidewire, an 8 French 25 Pinnacle sheath was inserted. Through this, and also over 0.035 inch guidewire, a 5 [place] JB 1 catheter was advanced to the aortic arch region and selectively positioned in the right common carotid artery and the left common carotid artery. FINDINGS: The right common carotid arteriogram demonstrates the right external carotid artery and its major branches to be widely patent. The right internal carotid artery in the stented segment demonstrates approximately 25% stenosis by the NASCET criteria of the midportion of the previously positioned stent. More distally, the vessel is seen to opacify to the cranial skull base. The petrous, the cavernous and the supraclinoid segments are widely patent with mild arteriosclerotic changes  in the proximal cavernous segment. Right posterior communicating artery is seen opacifying the right posterior cerebral artery distribution. The right middle cerebral artery and the right anterior cerebral artery opacify into the capillary and venous phases. Prompt cross-filling via the anterior communicating artery of the left anterior cerebral artery A2 segment is noted. The left common carotid arteriogram demonstrates eccentric plaque in the mid to distal 1/3 of the left common carotid artery with approximately 20% stenosis. Distal to this, the left external carotid artery and its major branches demonstrate wide patency. The left internal carotid artery at the bulb and just distally  demonstrates a long segment severe stenosis associated with a moderate-size ulcerated plaque along the inferoposterior wall at the level of the bulb. No evidence of intraluminal filling defect is demonstrated. More distally, the vessel is seen to opacify to the cranial skull base. The petrous, the cavernous and the supraclinoid segments are patent. Noted is a moderate stenosis at the supraclinoid left ICA. The left middle cerebral artery opacifies into the capillary and the venous phases. The partially opacified left anterior cerebral A1 segment demonstrates wide patency. Mixing of unopacified blood is seen from the contralateral right internal carotid artery via the anterior communicating artery as mentioned above. ENDOVASCULAR REVASCULARIZATION OF SYMPTOMATIC HIGH-GRADE STENOSIS OF THE PROXIMAL LEFT INTERNAL CAROTID ARTERY. The diagnostic catheter in the left common carotid artery was exchanged over a 0.035 inch 300 cm Rosen exchange wire for a 95 cm 087 balloon guide catheter which had been prepped with 50% contrast and 50% heparinized saline infusion. Guidewire was removed. Good aspiration obtained from the hub of the balloon guide catheter. An arteriogram was then performed through the balloon guide catheter which demonstrated no change in the extracranial or intracranial circulation. A 4-7 mm NAV Emboshield device was then prepped and purged with heparinized saline infusion antegradely and retrogradely in its housing. The filter was then captured into the delivery microcatheter. The combination of the 014 inch micro guidewire with the delivery microcatheter was then removed. A moderate J configuration was given to the distal wire. Thereafter, using biplane roadmap technique and constant fluoroscopic guidance using continuous heparinized saline infusion, the Emboshield delivery microcatheter was then advanced to the distal end of the balloon guide catheter. Using a torque device, the wire was gently advanced  through the severe stenosis unhindered followed by the delivery microcatheter to the cervical petrous junction. The filter was then deployed in the usual manner without difficulty. The delivery microcatheter of the filter was removed under fluoroscopic surveillance. A control arteriogram performed through the balloon guide catheter demonstrated no change in the extracranial or intracranial circulation. At this time, a 4 x 30 mm Viatrac 14 balloon angioplasty catheter was prepped and purged with heparinized saline infusion retrogradely and with 50% contrast and 50% heparinized saline infusion antegradely. Using the rapid exchange technique, the balloon was then advanced and positioned adequate distance from the site of the severe stenosis. Control inflation was then performed using micro inflation syringe device via micro tubing to 4 mm where it was maintained for approximately 40 seconds. During this time no significant hemodynamic or heart rate changes were evident. Balloon was deflated and retrieved and removed. A control arteriogram performed through the balloon guide catheter now demonstrated modestly improved caliber of the proximal left internal carotid stenosis. Measurements were then performed of left internal carotid artery distal to the stenosis, and also in the left common carotid artery. It was decided to proceed with placement of a 6/8 mm x 40  mm Xact stent. The stent delivery system was then prepped retrogradely with heparinized saline infusion. Thereafter, using a rapid exchange technique, the stent delivery apparatus was advanced and positioned with the distal and proximal markers covering the entire segment of severe angioplasty stenosis. The stent was deployed in the usual manner without any complications. The delivery apparatus was removed. Control arteriogram performed through the balloon guide now demonstrated excellent apposition with improved flow extra cranially and intracranially. A 5 mm x  30 mm Viatrac 14 balloon angioplasty catheter was then prepped and purged as described earlier in the usual manner. Again using the rapid exchange technique, this was advanced into the stent. A control inflation was then performed again using micro inflation syringe device via micro tubing to approximately 5 mm where it was maintained for approximately 30 seconds. The balloon was then deflated and retrieved and removed. A control arteriogram performed through the balloon guide catheter demonstrated excellent apposition with significantly improved caliber of the stented segment. At this time the filter capture device was then again advanced using the rapid exchange technique under fluoroscopic guidance. The filter device was then captured under fluoroscopic guidance into the filter device without difficulty. The system was removed under fluoroscopic guidance. A control arteriogram was then performed at 15 and 30 minutes post second angioplasty. These continued to demonstrate excellent apposition of flow through the stented segment of the proximal left internal carotid artery without evidence of intraluminal filling defects extra cranially or intracranially. Balloon guide was then removed. Hemostasis at the right groin puncture site was obtained using a pre clot device with manual compression for approximately 20 minutes. Distal pulses remained palpable in both feet. A flat panel CT of the brain demonstrated no evidence of intracranial hemorrhage or mass effect or midline shift. Patient was then extubated without difficulty. Upon recovery, the patient denied any nausea, vomiting or headaches. He was able to move his right arm and leg without any difficulty. There was no change in the mild left facial droop from the previous ischemic stroke. The patient was then transferred to neuro ICU for post revascularization care. The patient's overnight stay was unremarkable. The patient complained of a frontal headache which  responded to Tylenol, and low-dose IV fentanyl. The following morning, the IV heparin was stopped and the patient was switched to aspirin 81 mg a day and Brilinta 90 mg b.i.d. The right groin appeared soft.  Distal pulses were palpable. Neurologically, the patient had returned to his preprocedural status. He continued to have power in his left upper extremity, and a 3+ to 4/5 power in the left lower extremity. The right side appeared 5/5 in the upper and lower extremities. Patient appeared alert, awake, oriented to time, place, space. He was then transferred to the admitting service. 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 PLAN: Patient to be followed in the clinic in approximately 2-3 weeks time post discharge. Patient advised to continue on aspirin 81 mg and Brilinta 90 mg b.i.d. for a month. At that time the patient will be switched to aspirin 81 mg, and Plavix 75 mg a day for at least 6 months. Patient expressed understanding and agreement with the above management plan. Electronically Signed   By: Luanne Bras M.D.   On: 02/04/2021 09:06      Subjective: Patient seen and examined at bedside.  Adamant that he wants to go home today.  Does not want to go  to SNF.  No overnight fever, vomiting, seizures reported.  Discharge Exam: Vitals:   02/04/21 0442 02/04/21 0814  BP: (!) 176/86 124/70  Pulse: 62 76  Resp: 18 16  Temp: 98.2 F (36.8 C) 98.5 F (36.9 C)  SpO2: 100% 99%    General: Pt is alert, awake, not in acute distress.  Poor historian.  Slow to respond.   Cardiovascular: rate controlled, S1/S2 + Respiratory: bilateral decreased breath sounds at bases Abdominal: Soft, NT, ND, bowel sounds + Extremities: Trace lower extremity edema; no cyanosis    The results of significant diagnostics from this hospitalization (including imaging, microbiology,  ancillary and laboratory) are listed below for reference.     Microbiology: Recent Results (from the past 240 hour(s))  Resp Panel by RT-PCR (Flu A&B, Covid) Nasopharyngeal Swab     Status: None   Collection Time: 01/28/21  5:34 PM   Specimen: Nasopharyngeal Swab; Nasopharyngeal(NP) swabs in vial transport medium  Result Value Ref Range Status   SARS Coronavirus 2 by RT PCR NEGATIVE NEGATIVE Final    Comment: (NOTE) SARS-CoV-2 target nucleic acids are NOT DETECTED.  The SARS-CoV-2 RNA is generally detectable in upper respiratory specimens during the acute phase of infection. The lowest concentration of SARS-CoV-2 viral copies this assay can detect is 138 copies/mL. A negative result does not preclude SARS-Cov-2 infection and should not be used as the sole basis for treatment or other patient management decisions. A negative result may occur with  improper specimen collection/handling, submission of specimen other than nasopharyngeal swab, presence of viral mutation(s) within the areas targeted by this assay, and inadequate number of viral copies(<138 copies/mL). A negative result must be combined with clinical observations, patient history, and epidemiological information. The expected result is Negative.  Fact Sheet for Patients:  EntrepreneurPulse.com.au  Fact Sheet for Healthcare Providers:  IncredibleEmployment.be  This test is no t yet approved or cleared by the [place] FDA and  has been authorized for detection and/or diagnosis of SARS-CoV-2 by FDA under an Emergency Use Authorization (EUA). This EUA will remain  in effect (meaning this test can be used) for the duration of the COVID-19 declaration under Section 564(b)(1) of the Act, 21 U.S.C.section 360bbb-3(b)(1), unless the authorization is terminated  or revoked sooner.       Influenza A by PCR NEGATIVE NEGATIVE Final   Influenza B by PCR NEGATIVE NEGATIVE Final     Comment: (NOTE) The Xpert Xpress SARS-CoV-2/FLU/RSV plus assay is intended as an aid in the diagnosis of influenza from Nasopharyngeal swab specimens and should not be used as a sole basis for treatment. Nasal washings and aspirates are unacceptable for Xpert Xpress SARS-CoV-2/FLU/RSV testing.  Fact Sheet for Patients: EntrepreneurPulse.com.au  Fact Sheet for Healthcare Providers: IncredibleEmployment.be  This test is not yet approved or cleared by the [place] FDA and has been authorized for detection and/or diagnosis of SARS-CoV-2 by FDA under an Emergency Use Authorization (EUA). This EUA will remain in effect (meaning this test can be used) for the duration of the COVID-19 declaration under Section 564(b)(1) of the Act, 21 U.S.C. section 360bbb-3(b)(1), unless the authorization is terminated or revoked.  Performed at [facility], [place] [address] [place], [place] [address]   Surgical PCR screen     Status: Abnormal   Collection Time: 01/29/21  3:32 AM   Specimen: Nasal Mucosa; Nasal Swab  Result Value Ref Range Status   MRSA, PCR NEGATIVE NEGATIVE Final   Staphylococcus aureus POSITIVE (A) NEGATIVE Final  Comment: (NOTE) The Xpert SA Assay (FDA approved for NASAL specimens in patients 79 years of age and older), is one component of a comprehensive surveillance program. It is not intended to diagnose infection nor to guide or monitor treatment. Performed at [facility], [place] [address], [place], [place] [address]      Labs: BNP (last 3 results) No results for input(s): BNP in the last 8760 hours. Basic Metabolic Panel: Recent Labs  Lab 01/30/21 0313 01/31/21 0115 02/02/21 0146 02/03/21 0207  NA 136 132* 137 140  K 4.1 4.4 3.8 3.8  CL 104 107 110 111  CO2 23 20* 20* 21*  GLUCOSE 74 197* 209* 141*  BUN 28* 24* 19 18  CREATININE 2.60* 2.40* 2.20* 1.81*  CALCIUM 8.5* 7.8* 8.3* 8.5*  MG 2.0  --   --   --     Liver Function Tests: No results for input(s): AST, ALT, ALKPHOS, BILITOT, PROT, ALBUMIN in the last 168 hours. No results for input(s): LIPASE, AMYLASE in the last 168 hours. No results for input(s): AMMONIA in the last 168 hours. CBC: Recent Labs  Lab 01/30/21 0313 01/31/21 0115 02/02/21 0146  WBC 6.1 14.3* 11.7*  NEUTROABS 3.2 12.5*  --   HGB 11.8* 10.6* 9.3*  HCT 34.7* 32.1* 28.2*  MCV 91.6 94.4 94.0  PLT 296 273 239   Cardiac Enzymes: No results for input(s): CKTOTAL, CKMB, CKMBINDEX, TROPONINI in the last 168 hours. BNP: Invalid input(s): POCBNP CBG: Recent Labs  Lab 02/03/21 0610 02/03/21 1321 02/03/21 1732 02/03/21 2222 02/04/21 0623  GLUCAP 125* 175* 107* 169* 152*   D-Dimer No results for input(s): DDIMER in the last 72 hours. Hgb A1c No results for input(s): HGBA1C in the last 72 hours. Lipid Profile No results for input(s): CHOL, HDL, LDLCALC, TRIG, CHOLHDL, LDLDIRECT in the last 72 hours. Thyroid function studies No results for input(s): TSH, T4TOTAL, T3FREE, THYROIDAB in the last 72 hours.  Invalid input(s): FREET3 Anemia work up No results for input(s): VITAMINB12, FOLATE, FERRITIN, TIBC, IRON, RETICCTPCT in the last 72 hours. Urinalysis    Component Value Date/Time   COLORURINE YELLOW 01/28/2021 0617   APPEARANCEUR CLEAR 01/28/2021 0617   LABSPEC 1.025 01/28/2021 0617   PHURINE 6.0 01/28/2021 0617   GLUCOSEU NEGATIVE 01/28/2021 0617   HGBUR TRACE (A) 01/28/2021 0617   BILIRUBINUR NEGATIVE 01/28/2021 0617   KETONESUR NEGATIVE 01/28/2021 0617   PROTEINUR >300 (A) 01/28/2021 0617   NITRITE NEGATIVE 01/28/2021 0617   LEUKOCYTESUR NEGATIVE 01/28/2021 0617   Sepsis Labs Invalid input(s): PROCALCITONIN,  WBC,  LACTICIDVEN Microbiology Recent Results (from the past 240 hour(s))  Resp Panel by RT-PCR (Flu A&B, Covid) Nasopharyngeal Swab     Status: None   Collection Time: 01/28/21  5:34 PM   Specimen: Nasopharyngeal Swab; Nasopharyngeal(NP)  swabs in vial transport medium  Result Value Ref Range Status   SARS Coronavirus 2 by RT PCR NEGATIVE NEGATIVE Final    Comment: (NOTE) SARS-CoV-2 target nucleic acids are NOT DETECTED.  The SARS-CoV-2 RNA is generally detectable in upper respiratory specimens during the acute phase of infection. The lowest concentration of SARS-CoV-2 viral copies this assay can detect is 138 copies/mL. A negative result does not preclude SARS-Cov-2 infection and should not be used as the sole basis for treatment or other patient management decisions. A negative result may occur with  improper specimen collection/handling, submission of specimen other than nasopharyngeal swab, presence of viral mutation(s) within the areas targeted by this assay, and inadequate  number of viral copies(<138 copies/mL). A negative result must be combined with clinical observations, patient history, and epidemiological information. The expected result is Negative.  Fact Sheet for Patients:  EntrepreneurPulse.com.au  Fact Sheet for Healthcare Providers:  IncredibleEmployment.be  This test is no t yet approved or cleared by the [place] FDA and  has been authorized for detection and/or diagnosis of SARS-CoV-2 by FDA under an Emergency Use Authorization (EUA). This EUA will remain  in effect (meaning this test can be used) for the duration of the COVID-19 declaration under Section 564(b)(1) of the Act, 21 U.S.C.section 360bbb-3(b)(1), unless the authorization is terminated  or revoked sooner.       Influenza A by PCR NEGATIVE NEGATIVE Final   Influenza B by PCR NEGATIVE NEGATIVE Final    Comment: (NOTE) The Xpert Xpress SARS-CoV-2/FLU/RSV plus assay is intended as an aid in the diagnosis of influenza from Nasopharyngeal swab specimens and should not be used as a sole basis for treatment. Nasal washings and aspirates are unacceptable for Xpert Xpress  SARS-CoV-2/FLU/RSV testing.  Fact Sheet for Patients: EntrepreneurPulse.com.au  Fact Sheet for Healthcare Providers: IncredibleEmployment.be  This test is not yet approved or cleared by the [place] FDA and has been authorized for detection and/or diagnosis of SARS-CoV-2 by FDA under an Emergency Use Authorization (EUA). This EUA will remain in effect (meaning this test can be used) for the duration of the COVID-19 declaration under Section 564(b)(1) of the Act, 21 U.S.C. section 360bbb-3(b)(1), unless the authorization is terminated or revoked.  Performed at [facility], [place] [address] [place], [place] [address]   Surgical PCR screen     Status: Abnormal   Collection Time: 01/29/21  3:32 AM   Specimen: Nasal Mucosa; Nasal Swab  Result Value Ref Range Status   MRSA, PCR NEGATIVE NEGATIVE Final   Staphylococcus aureus POSITIVE (A) NEGATIVE Final    Comment: (NOTE) The Xpert SA Assay (FDA approved for NASAL specimens in patients 46 years of age and older), is one component of a comprehensive surveillance program. It is not intended to diagnose infection nor to guide or monitor treatment. Performed at [facility], [place] [address] [place], [place] [address]      Time coordinating discharge: 35 minutes  SIGNED:   Aline August, MD  [facility] 02/04/2021, 10:27 AM

## 2021-02-04 NOTE — TOC Transition Note (Signed)
Transition of Care [facility]) - CM/SW Discharge Note   Patient Details  Name: Bobby Branch MRN: [id number] Date of Birth: 11-Jul-1960  [facility]) CM/SW Contact:  Pollie Friar, RN Phone Number: 02/04/2021, 10:14 AM   Clinical Narrative:    Patient is discharging home with outpatient therapy through [facility]. Orders in Epic and information on the AVS. Pt has information on AVS to f/u with having caregivers at home and extra transportation services.  Rollator for home to be delivered to the room per [employer].  Pt has transportation home.    Final next level of care: OP Rehab Barriers to Discharge: No Barriers Identified   Patient Goals and CMS Choice     Choice offered to / list presented to : Patient  Discharge Placement                       Discharge Plan and Services   Discharge Planning Services: CM Consult Post Acute Care Choice: Home Health          DME Arranged: Walker rolling with seat DME Agency: [employer] Date DME Agency Contacted: 02/04/21   Representative spoke with at DME Agency: [facility] Determinants of Health ([place]) Interventions     Readmission Risk Interventions No flowsheet data found.

## 2021-02-04 NOTE — Plan of Care (Signed)
°  Problem: Education: Goal: Knowledge of General Education information will improve Description: Including pain rating scale, medication(s)/side effects and non-pharmacologic comfort measures Outcome: Adequate for Discharge   Problem: Health Behavior/Discharge Planning: Goal: Ability to manage health-related needs will improve Outcome: Adequate for Discharge   Problem: Clinical Measurements: Goal: Ability to maintain clinical measurements within normal limits will improve Outcome: Adequate for Discharge Goal: Will remain free from infection Outcome: Adequate for Discharge Goal: Diagnostic test results will improve Outcome: Adequate for Discharge Goal: Respiratory complications will improve Outcome: Adequate for Discharge Goal: Cardiovascular complication will be avoided Outcome: Adequate for Discharge   Problem: Activity: Goal: Risk for activity intolerance will decrease Outcome: Adequate for Discharge   Problem: Nutrition: Goal: Adequate nutrition will be maintained Outcome: Adequate for Discharge   Problem: Coping: Goal: Level of anxiety will decrease Outcome: Adequate for Discharge   Problem: Elimination: Goal: Will not experience complications related to bowel motility Outcome: Adequate for Discharge Goal: Will not experience complications related to urinary retention Outcome: Adequate for Discharge   Problem: Pain Managment: Goal: General experience of comfort will improve Outcome: Adequate for Discharge   Problem: Safety: Goal: Ability to remain free from injury will improve Outcome: Adequate for Discharge   Problem: Skin Integrity: Goal: Risk for impaired skin integrity will decrease Outcome: Adequate for Discharge   Problem: Acute Rehab PT Goals(only PT should resolve) Goal: Pt Will Go Supine/Side To Sit Outcome: Adequate for Discharge Goal: Patient Will Transfer Sit To/From Stand Outcome: Adequate for Discharge Goal: Pt Will Ambulate Outcome: Adequate  for Discharge Goal: Pt Will Verbalize and Adhere to Precautions While Description: PT Will Verbalize and Adhere to Precautions While Performing Mobility Outcome: Adequate for Discharge Goal: Pt/caregiver will Perform Home Exercise Program Outcome: Adequate for Discharge   Problem: Education: Goal: Knowledge of disease or condition will improve Outcome: Adequate for Discharge Goal: Knowledge of secondary prevention will improve (SELECT ALL) Outcome: Adequate for Discharge Goal: Knowledge of patient specific risk factors will improve (INDIVIDUALIZE FOR PATIENT) Outcome: Adequate for Discharge Goal: Individualized Educational Video(s) Outcome: Adequate for Discharge   Problem: Coping: Goal: Will verbalize positive feelings about self Outcome: Adequate for Discharge Goal: Will identify appropriate support needs Outcome: Adequate for Discharge   Problem: Health Behavior/Discharge Planning: Goal: Ability to manage health-related needs will improve Outcome: Adequate for Discharge   Problem: Self-Care: Goal: Ability to participate in self-care as condition permits will improve Outcome: Adequate for Discharge Goal: Verbalization of feelings and concerns over difficulty with self-care will improve Outcome: Adequate for Discharge Goal: Ability to communicate needs accurately will improve Outcome: Adequate for Discharge   Problem: Nutrition: Goal: Risk of aspiration will decrease Outcome: Adequate for Discharge Goal: Dietary intake will improve Outcome: Adequate for Discharge   Problem: Ischemic Stroke/TIA Tissue Perfusion: Goal: Complications of ischemic stroke/TIA will be minimized Outcome: Adequate for Discharge

## 2021-02-05 ENCOUNTER — Telehealth: Payer: Self-pay

## 2021-02-05 NOTE — Telephone Encounter (Signed)
Transition Care Management Follow-up Telephone Call Date of discharge and from where: 02/04/2021, [facility] [address]  How have you been since you were released from the hospital? He said he is doing " all right."  Any questions or concerns? Yes- He receives $560/month social security and his rent is now $860/month. He is not sure how he will afford this or why his income is only $560/month. He was in agreement to sending a referral to [facility] [place] to see if they can explain to him why his income is reduced. Referral then sent to Flint Melter, Attorney/LANC. His family had assisted him with paying rent up until now and he will need to work with his landlord to address his issues with paying his rent.  He receives $266/month food stamps. He explained that he just spoke to his insurance company about PCS and they have a representative coming out to see him 02/08/2021 for an assessment. He said that he never received PCS in the past because he moved to [facility] for awhile. The insurance company told him to call them when he returned to [facility] but he was never able to speak to anyone at [employer].  He said that he knows what agency he wants to use for PCS and what aide he will request.  He has contacted his insurance company and confirmed that the agency he prefers is in their network.  Items Reviewed: Did the pt receive and understand the discharge instructions provided? Yes  Medications obtained and verified? Yes - he said he has all medications and did not have any questions about his med regime.  Other? No  Any new allergies since your discharge? No  Dietary orders reviewed? Yes Do you have support at home?  Lives alone   Home Care and Equipment/Supplies: Were home health services ordered? no If so, what is the name of the agency? N/a  Has the agency set up a time to come to the patient's home? not applicable Were any new equipment or medical supplies ordered?  No What is the name of the  medical supply agency? N/a Were you able to get the supplies/equipment? not applicable Do you have any questions related to the use of the equipment or supplies? No  Functional Questionnaire: (I = Independent and D = Dependent) ADLs: independent. He has a wheelchair, rollator, shower chair, bedside commode to use when needed. He said he never received the left AFO because his therapy sessions ran out prior to receiving it. He will be returning to outpatient neuro PT on 02/06/2021.   Follow up appointments reviewed:  PCP Hospital f/u appt confirmed? Yes  Scheduled to see Dr Margarita Rana 02/19/2021. He is aware of the new clinic address - [address] [facility] f/u appt confirmed?  None scheduled yet.    Are transportation arrangements needed? No  - friends provide transportation or he calls his insurance company to schedule rides to medical appointments,. If their condition worsens, is the pt aware to call PCP or go to the Emergency Dept.? Yes Was the patient provided with contact information for the PCP's office or ED? Yes Was to pt encouraged to call back with questions or concerns? Yes

## 2021-02-05 NOTE — Telephone Encounter (Signed)
He said he is doing " all right."   He receives $560/month social security and his rent is now $860/month. He is not sure how he will afford this or why his income is only $560/month. He was in agreement to sending a referral to [facility] [place] [place] [place] to see if they can explain to him why his income is reduced. Referral then sent to Flint Melter, Attorney/LANC. His family had assisted him with paying rent up until now and he will need to work with his landlord to address his issues with paying his rent.   He explained that he just spoke to his insurance company about PCS and they have a representative coming out to see him 02/08/2021 for an assessment. He said that he never received PCS in the past because he moved to [facility] for awhile. The insurance company told him to call them when he returned to [facility] but he was never able to speak to anyone at [employer].  He said that he knows what agency he wants to use for PCS and what aide he will request.  He has contacted his insurance company and confirmed that the agency he prefers is in their network.   he said he has all medications and did not have any questions about his med regime.    Scheduled to see Dr Margarita Rana 02/19/2021. He is aware of the new clinic address - [address]   Marland Kitchen

## 2021-02-06 ENCOUNTER — Ambulatory Visit: Payer: Self-pay | Admitting: Physical Therapy

## 2021-02-19 ENCOUNTER — Ambulatory Visit: Payer: Federal, State, Local not specified - PPO | Admitting: Family Medicine

## 2021-02-21 ENCOUNTER — Telehealth: Payer: Self-pay

## 2021-02-21 NOTE — Telephone Encounter (Signed)
As per Novella Olive, attorney/LANC, they have provided patient with advice regarding his questions about his social security income and he needs to contact [place] himself.

## 2021-02-25 ENCOUNTER — Ambulatory Visit: Payer: Federal, State, Local not specified - PPO | Admitting: Family Medicine

## 2021-03-06 ENCOUNTER — Other Ambulatory Visit: Payer: Self-pay | Admitting: Physician Assistant

## 2021-03-06 ENCOUNTER — Other Ambulatory Visit: Payer: Self-pay | Admitting: Family Medicine

## 2021-03-06 ENCOUNTER — Other Ambulatory Visit: Payer: Self-pay

## 2021-03-06 DIAGNOSIS — E11618 Type 2 diabetes mellitus with other diabetic arthropathy: Secondary | ICD-10-CM

## 2021-03-06 DIAGNOSIS — I10 Essential (primary) hypertension: Secondary | ICD-10-CM

## 2021-03-06 MED ORDER — HYDRALAZINE HCL 50 MG PO TABS
50.0000 mg | ORAL_TABLET | Freq: Three times a day (TID) | ORAL | 2 refills | Status: DC
  Filled 2021-03-06: qty 90, 30d supply, fill #0

## 2021-03-06 MED ORDER — LOSARTAN POTASSIUM 25 MG PO TABS
25.0000 mg | ORAL_TABLET | Freq: Every day | ORAL | 1 refills | Status: DC
  Filled 2021-03-06: qty 90, 90d supply, fill #0

## 2021-03-06 NOTE — Telephone Encounter (Signed)
Requested Prescriptions  Pending Prescriptions Disp Refills   amLODipine (NORVASC) 10 MG tablet 30 tablet 3    Sig: Take 1 tablet (10 mg total) by mouth daily.     Cardiovascular: Calcium Channel Blockers 2 Passed - 03/06/2021  9:58 AM      Passed - Last BP in normal range    BP Readings from Last 1 Encounters:  02/04/21 124/70         Passed - Last Heart Rate in normal range    Pulse Readings from Last 1 Encounters:  02/04/21 76         Passed - Valid encounter within last 6 months    Recent Outpatient Visits          3 months ago Controlled type 2 diabetes mellitus with hyperglycemia, without long-term current use of insulin ([place])   [place], Charlane Ferretti, MD   6 months ago Controlled type 2 diabetes mellitus with hyperglycemia, without long-term current use of insulin ([place])   [place], Charlane Ferretti, MD   8 months ago Acute right MCA stroke [facility])   [place], Levada Dy M, [place]              hydrALAZINE (APRESOLINE) 50 MG tablet 90 tablet 2    Sig: Take 1 tablet (50 mg total) by mouth 3 (three) times daily.     Cardiovascular:  Vasodilators Failed - 03/06/2021  9:58 AM      Failed - HCT in normal range and within 360 days    HCT  Date Value Ref Range Status  02/02/2021 28.2 (L) 39.0 - 52.0 % Final         Failed - HGB in normal range and within 360 days    Hemoglobin  Date Value Ref Range Status  02/02/2021 9.3 (L) 13.0 - 17.0 g/dL Final         Failed - RBC in normal range and within 360 days    RBC  Date Value Ref Range Status  02/02/2021 3.00 (L) 4.22 - 5.81 MIL/uL Final         Failed - WBC in normal range and within 360 days    WBC  Date Value Ref Range Status  02/02/2021 11.7 (H) 4.0 - 10.5 K/uL Final         Passed - PLT in normal range and within 360 days    Platelets  Date Value Ref Range Status  02/02/2021 239 150 - 400 K/uL Final          Passed - ANA Screen, Ifa, Serum in normal range and within 360 days    ANA Ab, IFA  Date Value Ref Range Status  04/23/2020 Negative  Final    Comment:    (NOTE)                                     Negative   <1:80                                     Borderline  1:80  Positive   >1:80 ICAP nomenclature: AC-0 For more information about Hep-2 cell patterns use ANApatterns.org, the official website for the International Consensus on Antinuclear Antibody (ANA) Patterns (ICAP). Performed At: [facility] [place], [place] [id number] Rush Farmer MD [id number]          VHQION - Last BP in normal range    BP Readings from Last 1 Encounters:  02/04/21 124/70         Passed - Valid encounter within last 12 months    Recent Outpatient Visits          3 months ago Controlled type 2 diabetes mellitus with hyperglycemia, without long-term current use of insulin ([place])   [place], Charlane Ferretti, MD   6 months ago Controlled type 2 diabetes mellitus with hyperglycemia, without long-term current use of insulin ([place])   [place], Enobong, MD   8 months ago Acute right MCA stroke [facility])   [place], [place], [place]

## 2021-03-06 NOTE — Telephone Encounter (Signed)
Requested Prescriptions  Pending Prescriptions Disp Refills   tiZANidine (ZANAFLEX) 4 MG tablet 90 tablet 1    Sig: TAKE 1 TABLET(2 MG) BY MOUTH THREE TIMES DAILY     Not Delegated - Cardiovascular:  Alpha-2 Agonists - tizanidine Failed - 03/06/2021  9:58 AM      Failed - This refill cannot be delegated      Passed - Valid encounter within last 6 months    Recent Outpatient Visits          3 months ago Controlled type 2 diabetes mellitus with hyperglycemia, without long-term current use of insulin ([place])   [place], Charlane Ferretti, MD   6 months ago Controlled type 2 diabetes mellitus with hyperglycemia, without long-term current use of insulin ([place])   [place], Charlane Ferretti, MD   8 months ago Acute right MCA stroke [facility])   [place], Levada Dy M, [place]              losartan (COZAAR) 25 MG tablet 90 tablet 1    Sig: Take 1 tab by mouth daily     Cardiovascular:  Angiotensin Receptor Blockers Failed - 03/06/2021  9:58 AM      Failed - Cr in normal range and within 180 days    Creatinine, Ser  Date Value Ref Range Status  02/03/2021 1.81 (H) 0.61 - 1.24 mg/dL Final         Passed - K in normal range and within 180 days    Potassium  Date Value Ref Range Status  02/03/2021 3.8 3.5 - 5.1 mmol/L Final         Passed - Patient is not pregnant      Passed - Last BP in normal range    BP Readings from Last 1 Encounters:  02/04/21 124/70         Passed - Valid encounter within last 6 months    Recent Outpatient Visits          3 months ago Controlled type 2 diabetes mellitus with hyperglycemia, without long-term current use of insulin ([place])   [place], [place], MD   6 months ago Controlled type 2 diabetes mellitus with hyperglycemia, without long-term current use of insulin ([place])   [place],  Enobong, MD   8 months ago Acute right MCA stroke [facility])   [place], [place], [place]

## 2021-03-06 NOTE — Telephone Encounter (Signed)
Requested medication (s) are due for refill today: yes  Requested medication (s) are on the active medication list: yes  Last refill:  11/22/20 #90/1  Future visit scheduled: no  Notes to clinic:  Unable to refill per protocol, cannot delegate.     Requested Prescriptions  Pending Prescriptions Disp Refills   tiZANidine (ZANAFLEX) 4 MG tablet 90 tablet 1    Sig: TAKE 1 TABLET(2 MG) BY MOUTH THREE TIMES DAILY     Not Delegated - Cardiovascular:  Alpha-2 Agonists - tizanidine Failed - 03/06/2021  9:58 AM      Failed - This refill cannot be delegated      Passed - Valid encounter within last 6 months    Recent Outpatient Visits           3 months ago Controlled type 2 diabetes mellitus with hyperglycemia, without long-term current use of insulin ([place])   [place], Charlane Ferretti, MD   6 months ago Controlled type 2 diabetes mellitus with hyperglycemia, without long-term current use of insulin ([place])   [place], Charlane Ferretti, MD   8 months ago Acute right MCA stroke [facility])   [place], [place], [place]              Signed Prescriptions Disp Refills   losartan (COZAAR) 25 MG tablet 90 tablet 1    Sig: Take 1 tab by mouth daily     Cardiovascular:  Angiotensin Receptor Blockers Failed - 03/06/2021  9:58 AM      Failed - Cr in normal range and within 180 days    Creatinine, Ser  Date Value Ref Range Status  02/03/2021 1.81 (H) 0.61 - 1.24 mg/dL Final          Passed - K in normal range and within 180 days    Potassium  Date Value Ref Range Status  02/03/2021 3.8 3.5 - 5.1 mmol/L Final          Passed - Patient is not pregnant      Passed - Last BP in normal range    BP Readings from Last 1 Encounters:  02/04/21 124/70          Passed - Valid encounter within last 6 months    Recent Outpatient Visits           3 months ago Controlled type 2 diabetes mellitus  with hyperglycemia, without long-term current use of insulin ([place])   [place], Kimberly, MD   6 months ago Controlled type 2 diabetes mellitus with hyperglycemia, without long-term current use of insulin ([place])   [place], Enobong, MD   8 months ago Acute right MCA stroke [facility])   [place], [place], [place]

## 2021-03-06 NOTE — Telephone Encounter (Signed)
Requested medication (s) are due for refill today: yes  Requested medication (s) are on the active medication list: no  Last refill:  10/02/20  Future visit scheduled: no  Notes to clinic:  med was dc'd on 11/22/20 d/t change in therapy. Please advise     Requested Prescriptions  Pending Prescriptions Disp Refills   amLODipine (NORVASC) 10 MG tablet 30 tablet 3    Sig: Take 1 tablet (10 mg total) by mouth daily.     Cardiovascular: Calcium Channel Blockers 2 Passed - 03/06/2021  9:58 AM      Passed - Last BP in normal range    BP Readings from Last 1 Encounters:  02/04/21 124/70          Passed - Last Heart Rate in normal range    Pulse Readings from Last 1 Encounters:  02/04/21 76          Passed - Valid encounter within last 6 months    Recent Outpatient Visits           3 months ago Controlled type 2 diabetes mellitus with hyperglycemia, without long-term current use of insulin ([place])   [place], [place], MD   6 months ago Controlled type 2 diabetes mellitus with hyperglycemia, without long-term current use of insulin ([place])   [place], Charlane Ferretti, MD   8 months ago Acute right MCA stroke [facility])   [place], [place], [place]              Signed Prescriptions Disp Refills   hydrALAZINE (APRESOLINE) 50 MG tablet 90 tablet 2    Sig: Take 1 tablet (50 mg total) by mouth 3 (three) times daily.     Cardiovascular:  Vasodilators Failed - 03/06/2021  9:58 AM      Failed - HCT in normal range and within 360 days    HCT  Date Value Ref Range Status  02/02/2021 28.2 (L) 39.0 - 52.0 % Final          Failed - HGB in normal range and within 360 days    Hemoglobin  Date Value Ref Range Status  02/02/2021 9.3 (L) 13.0 - 17.0 g/dL Final          Failed - RBC in normal range and within 360 days    RBC  Date Value Ref Range Status  02/02/2021 3.00 (L) 4.22  - 5.81 MIL/uL Final          Failed - WBC in normal range and within 360 days    WBC  Date Value Ref Range Status  02/02/2021 11.7 (H) 4.0 - 10.5 K/uL Final          Passed - PLT in normal range and within 360 days    Platelets  Date Value Ref Range Status  02/02/2021 239 150 - 400 K/uL Final          Passed - ANA Screen, Ifa, Serum in normal range and within 360 days    ANA Ab, IFA  Date Value Ref Range Status  04/23/2020 Negative  Final    Comment:    (NOTE)                                     Negative   <1:80  Borderline  1:80                                     Positive   >1:80 ICAP nomenclature: AC-0 For more information about Hep-2 cell patterns use ANApatterns.org, the official website for the International Consensus on Antinuclear Antibody (ANA) Patterns (ICAP). Performed At: [facility] [place], [place] [id number] Rush Farmer MD MV:[id number]           GEZMOQ - Last BP in normal range    BP Readings from Last 1 Encounters:  02/04/21 124/70          Passed - Valid encounter within last 12 months    Recent Outpatient Visits           3 months ago Controlled type 2 diabetes mellitus with hyperglycemia, without long-term current use of insulin ([place])   [place], Charlane Ferretti, MD   6 months ago Controlled type 2 diabetes mellitus with hyperglycemia, without long-term current use of insulin ([place])   [place], Enobong, MD   8 months ago Acute right MCA stroke [facility])   [place], [place], [place]

## 2021-03-07 ENCOUNTER — Other Ambulatory Visit: Payer: Self-pay

## 2021-03-07 ENCOUNTER — Other Ambulatory Visit (HOSPITAL_COMMUNITY): Payer: Self-pay

## 2021-03-07 ENCOUNTER — Other Ambulatory Visit: Payer: Self-pay | Admitting: *Deleted

## 2021-03-07 DIAGNOSIS — I10 Essential (primary) hypertension: Secondary | ICD-10-CM

## 2021-03-07 DIAGNOSIS — I6523 Occlusion and stenosis of bilateral carotid arteries: Secondary | ICD-10-CM

## 2021-03-07 NOTE — Telephone Encounter (Signed)
Refill requests with new pharmacy sent to [facility] refill pool.

## 2021-03-07 NOTE — Telephone Encounter (Signed)
Pt requests that a nurse return his call asap because he has been without his medication for 3 days. Cb# [phone]    Attempted to reach pt, left VM to call back to discuss medication issue

## 2021-03-11 ENCOUNTER — Other Ambulatory Visit: Payer: Self-pay | Admitting: Family Medicine

## 2021-03-11 DIAGNOSIS — I6523 Occlusion and stenosis of bilateral carotid arteries: Secondary | ICD-10-CM

## 2021-03-11 DIAGNOSIS — I10 Essential (primary) hypertension: Secondary | ICD-10-CM

## 2021-03-11 NOTE — Telephone Encounter (Signed)
Medication: Rx #: [id number] hydrALAZINE (APRESOLINE) 50 MG tablet [[id number]] , Rx #: [id number] ticagrelor (BRILINTA) 90 MG TABS tablet [[id number]] , Rx #: [id number] glimepiride (AMARYL) 2 MG tablet [[id number]]    Has the patient contacted their pharmacy? YES Patient is advised to contact the office (Agent: If no, request that the patient contact the pharmacy for the refill. If patient does not wish to contact the pharmacy document the reason why and proceed with request.) (Agent: If yes, when and what did the pharmacy advise?)  Preferred Pharmacy (with phone number or street name): [address] [place], [place] [address] [phone] Has the patient been seen for an appointment in the last year OR does the patient have an upcoming appointment? NO  Agent: Please be advised that RX refills may take up to 3 business days. We ask that you follow-up with your pharmacy.

## 2021-03-12 ENCOUNTER — Other Ambulatory Visit: Payer: Self-pay

## 2021-03-12 ENCOUNTER — Ambulatory Visit: Payer: Self-pay | Admitting: *Deleted

## 2021-03-12 ENCOUNTER — Other Ambulatory Visit: Payer: Self-pay | Admitting: Family Medicine

## 2021-03-12 DIAGNOSIS — E11618 Type 2 diabetes mellitus with other diabetic arthropathy: Secondary | ICD-10-CM

## 2021-03-12 DIAGNOSIS — I10 Essential (primary) hypertension: Secondary | ICD-10-CM

## 2021-03-12 DIAGNOSIS — F32A Depression, unspecified: Secondary | ICD-10-CM

## 2021-03-12 DIAGNOSIS — M7502 Adhesive capsulitis of left shoulder: Secondary | ICD-10-CM

## 2021-03-12 DIAGNOSIS — E1122 Type 2 diabetes mellitus with diabetic chronic kidney disease: Secondary | ICD-10-CM

## 2021-03-12 DIAGNOSIS — I6523 Occlusion and stenosis of bilateral carotid arteries: Secondary | ICD-10-CM

## 2021-03-12 MED ORDER — TICAGRELOR 90 MG PO TABS: 90.0000 mg | ORAL_TABLET | Freq: Two times a day (BID) | ORAL | 0 refills | Status: DC

## 2021-03-12 MED ORDER — GLIMEPIRIDE 2 MG PO TABS: 3.0000 mg | ORAL_TABLET | Freq: Every day | ORAL | 0 refills | Status: DC

## 2021-03-12 NOTE — Telephone Encounter (Signed)
Copied from [place] [id number]. Topic: General - Inquiry >> Mar 12, 2021 11:38 AM Bobby Branch wrote: Reason for CRM: Pt called in stating he is very upset that he is still waiting for his medications, and wants to speak with someone higher, to see about getting it pushed through, please advise.

## 2021-03-12 NOTE — Telephone Encounter (Signed)
Requested medication (s) are due for refill today:   No  Requested medication (s) are on the active medication list:   Yes for all 3  Future visit scheduled:   No.   Been a No Show for last 2 appts and cancelled another 1 of his appts   Last ordered: Apresoline  03/06/2021 #90, 2 refills;  Brilinta prescribed from another provider from hospital admission 02/04/2021 #60, 0 refills;  Amaryl 11/22/2020 #46, 6 refills  Returned because criteria not met.   Requested Prescriptions  Pending Prescriptions Disp Refills   glimepiride (AMARYL) 2 MG tablet 45 tablet 6    Sig: Take 1.5 tablets (3 mg total) by mouth daily with breakfast.     Endocrinology:  Diabetes - Sulfonylureas Failed - 03/11/2021  6:26 PM      Failed - Cr in normal range and within 360 days    Creatinine, Ser  Date Value Ref Range Status  02/03/2021 1.81 (H) 0.61 - 1.24 mg/dL Final          Passed - HBA1C is between 0 and 7.9 and within 180 days    HbA1c, POC (controlled diabetic range)  Date Value Ref Range Status  11/22/2020 6.7 0.0 - 7.0 % Final   Hgb A1c MFr Bld  Date Value Ref Range Status  01/29/2021 6.5 (H) 4.8 - 5.6 % Final    Comment:    (NOTE) Pre diabetes:          5.7%-6.4%  Diabetes:              >6.4%  Glycemic control for   <7.0% adults with diabetes           Passed - Valid encounter within last 6 months    Recent Outpatient Visits           3 months ago Controlled type 2 diabetes mellitus with hyperglycemia, without long-term current use of insulin ([place])   [place], [place], MD   6 months ago Controlled type 2 diabetes mellitus with hyperglycemia, without long-term current use of insulin ([place])   [place], Charlane Ferretti, MD   8 months ago Acute right MCA stroke [facility])   [place], Levada Dy M, [place]               ticagrelor (BRILINTA) 90 MG TABS tablet 60 tablet 0    Sig: Take 1  tablet (90 mg total) by mouth 2 (two) times daily.     Hematology: Antiplatelets - ticagrelor Failed - 03/11/2021  6:26 PM      Failed - Cr in normal range and within 180 days    Creatinine, Ser  Date Value Ref Range Status  02/03/2021 1.81 (H) 0.61 - 1.24 mg/dL Final          Failed - HCT in normal range and within 180 days    HCT  Date Value Ref Range Status  02/02/2021 28.2 (L) 39.0 - 52.0 % Final          Failed - HGB in normal range and within 180 days    Hemoglobin  Date Value Ref Range Status  02/02/2021 9.3 (L) 13.0 - 17.0 g/dL Final          Passed - PLT in normal range and within 180 days    Platelets  Date Value Ref Range Status  02/02/2021 239 150 - 400 K/uL Final  Passed - Last Heart Rate in normal range    Pulse Readings from Last 1 Encounters:  02/04/21 76          Passed - Valid encounter within last 6 months    Recent Outpatient Visits           3 months ago Controlled type 2 diabetes mellitus with hyperglycemia, without long-term current use of insulin ([place])   [place], Charlane Ferretti, MD   6 months ago Controlled type 2 diabetes mellitus with hyperglycemia, without long-term current use of insulin ([place])   [place], Charlane Ferretti, MD   8 months ago [facility])   [place], Levada Dy M, [place]               hydrALAZINE (APRESOLINE) 50 MG tablet 90 tablet 2    Sig: Take 1 tablet (50 mg total) by mouth 3 (three) times daily.     Cardiovascular:  Vasodilators Failed - 03/11/2021  6:26 PM      Failed - HCT in normal range and within 360 days    HCT  Date Value Ref Range Status  02/02/2021 28.2 (L) 39.0 - 52.0 % Final          Failed - HGB in normal range and within 360 days    Hemoglobin  Date Value Ref Range Status  02/02/2021 9.3 (L) 13.0 - 17.0 g/dL Final          Failed - RBC in normal range and within 360  days    RBC  Date Value Ref Range Status  02/02/2021 3.00 (L) 4.22 - 5.81 MIL/uL Final          Failed - WBC in normal range and within 360 days    WBC  Date Value Ref Range Status  02/02/2021 11.7 (H) 4.0 - 10.5 K/uL Final          Passed - PLT in normal range and within 360 days    Platelets  Date Value Ref Range Status  02/02/2021 239 150 - 400 K/uL Final          Passed - ANA Screen, Ifa, Serum in normal range and within 360 days    ANA Ab, IFA  Date Value Ref Range Status  04/23/2020 Negative  Final    Comment:    (NOTE)                                     Negative   <1:80                                     Borderline  1:80                                     Positive   >1:80 ICAP nomenclature: AC-0 For more information about Hep-2 cell patterns use ANApatterns.org, the official website for the International Consensus on Antinuclear Antibody (ANA) Patterns (ICAP). Performed At: [facility] [place], [place] [id number] Rush Farmer MD NI:[id number]           Passed - Last BP in normal range    BP Readings from Last  1 Encounters:  02/04/21 124/70          Passed - Valid encounter within last 12 months    Recent Outpatient Visits           3 months ago Controlled type 2 diabetes mellitus with hyperglycemia, without long-term current use of insulin ([place])   [place], [place], MD   6 months ago Controlled type 2 diabetes mellitus with hyperglycemia, without long-term current use of insulin ([place])   [place], Enobong, MD   8 months ago Acute right MCA stroke [facility])   [place], [place], [place]

## 2021-03-12 NOTE — Telephone Encounter (Signed)
°  [facility] due to being evicted from his home. Patient reports he is out of all medications and would like a call from PCP. Please advise.    Reason for Disposition  [1] Prescription refill request for ESSENTIAL medicine (i.e., likelihood of harm to patient if not taken) AND [2] triager unable to refill per department policy  Answer Assessment - Initial Assessment Questions 1. DRUG NAME: "What medicine do you need to have refilled?"     Multiple medications see refill request list  2. REFILLS REMAINING: "How many refills are remaining?" (Note: The label on the medicine or pill bottle will show how many refills are remaining. If there are no refills remaining, then a renewal may be needed.)     na 3. EXPIRATION DATE: "What is the expiration date?" (Note: The label states when the prescription will expire, and thus can no longer be refilled.)     na 4. PRESCRIBING HCP: "Who prescribed it?" Reason: If prescribed by specialist, call should be referred to that group.     PCP 5. SYMPTOMS: "Do you have any symptoms?"     Has ran out of medication , no sx reported 6. PREGNANCY: "Is there any chance that you are pregnant?" "When was your last menstrual period?"     na  Protocols used: Medication Refill and Renewal Call-A-AH

## 2021-03-14 NOTE — Telephone Encounter (Signed)
Call placed to patient and VM was left informing him that his medication has been refilled .

## 2021-03-15 ENCOUNTER — Telehealth: Payer: Self-pay | Admitting: Family Medicine

## 2021-03-15 MED ORDER — TIZANIDINE HCL 4 MG PO TABS: 4.0000 mg | ORAL_TABLET | Freq: Three times a day (TID) | ORAL | 0 refills | Status: DC

## 2021-03-15 MED ORDER — HYDRALAZINE HCL 50 MG PO TABS: 50.0000 mg | ORAL_TABLET | Freq: Three times a day (TID) | ORAL | 2 refills | Status: DC

## 2021-03-15 NOTE — Telephone Encounter (Signed)
[facility] called and [facility] or Hydralazine is not there. I advised both were sent in today. Advised I will call the pharmacy. [facility] and [facility] to [place], [facility] about the refills. He says they are ready for pickup. Patient called back and advised, he verbalized understanding.

## 2021-03-15 NOTE — Telephone Encounter (Signed)
Requested medication (s) are due for refill today: Yes  Requested medication (s) are on the active medication list: Yes  Last refill:  11/22/20  Future visit scheduled: Yes  Notes to clinic:  Unable to refill per protocol, cannot delegate, patient requesting to be sent in to another pharmacy      Requested Prescriptions  Pending Prescriptions Disp Refills   tiZANidine (ZANAFLEX) 4 MG tablet 90 tablet 1    Sig: TAKE 1 TABLET(2 MG) BY MOUTH THREE TIMES DAILY     Not Delegated - Cardiovascular:  Alpha-2 Agonists - tizanidine Failed - 03/15/2021  4:00 PM      Failed - This refill cannot be delegated      Passed - Valid encounter within last 6 months    Recent Outpatient Visits           3 months ago Controlled type 2 diabetes mellitus with hyperglycemia, without long-term current use of insulin ([place])   [place], [place], MD   6 months ago Controlled type 2 diabetes mellitus with hyperglycemia, without long-term current use of insulin ([place])   [place], [place], MD   8 months ago Acute right MCA stroke [facility])   [place], [place], [place]              Signed Prescriptions Disp Refills   hydrALAZINE (APRESOLINE) 50 MG tablet 90 tablet 2    Sig: Take 1 tablet (50 mg total) by mouth 3 (three) times daily.     Cardiovascular:  Vasodilators Failed - 03/15/2021  4:00 PM      Failed - HCT in normal range and within 360 days    HCT  Date Value Ref Range Status  02/02/2021 28.2 (L) 39.0 - 52.0 % Final          Failed - HGB in normal range and within 360 days    Hemoglobin  Date Value Ref Range Status  02/02/2021 9.3 (L) 13.0 - 17.0 g/dL Final          Failed - RBC in normal range and within 360 days    RBC  Date Value Ref Range Status  02/02/2021 3.00 (L) 4.22 - 5.81 MIL/uL Final          Failed - WBC in normal range and within 360 days    WBC  Date  Value Ref Range Status  02/02/2021 11.7 (H) 4.0 - 10.5 K/uL Final          Passed - PLT in normal range and within 360 days    Platelets  Date Value Ref Range Status  02/02/2021 239 150 - 400 K/uL Final          Passed - ANA Screen, Ifa, Serum in normal range and within 360 days    ANA Ab, IFA  Date Value Ref Range Status  04/23/2020 Negative  Final    Comment:    (NOTE)                                     Negative   <1:80                                     Borderline  1:80  Positive   >1:80 ICAP nomenclature: AC-0 For more information about Hep-2 cell patterns use ANApatterns.org, the official website for the International Consensus on Antinuclear Antibody (ANA) Patterns (ICAP). Performed At: [facility] [place], [place] [id number] Rush Farmer MD FT:[id number]           CWCBJS - Last BP in normal range    BP Readings from Last 1 Encounters:  02/04/21 124/70          Passed - Valid encounter within last 12 months    Recent Outpatient Visits           3 months ago Controlled type 2 diabetes mellitus with hyperglycemia, without long-term current use of insulin ([place])   [place], Charlane Ferretti, MD   6 months ago Controlled type 2 diabetes mellitus with hyperglycemia, without long-term current use of insulin ([place])   [place], Enobong, MD   8 months ago Acute right MCA stroke [facility])   [place], [place], [place]

## 2021-03-15 NOTE — Telephone Encounter (Signed)
Pt called back to report that he is missing his Tizanidine, please advise. Patient says his arm is stiff.   [id number] [place], [place] - [address]  [place]  [address]  Phone: [phone] Fax: [phone]

## 2021-03-15 NOTE — Telephone Encounter (Signed)
Resending Hydralazine to another pharmacy as provider initially ordered #90/2 refills.  Requested Prescriptions  Pending Prescriptions Disp Refills   hydrALAZINE (APRESOLINE) 50 MG tablet 90 tablet 2    Sig: Take 1 tablet (50 mg total) by mouth 3 (three) times daily.     Cardiovascular:  Vasodilators Failed - 03/15/2021  4:00 PM      Failed - HCT in normal range and within 360 days    HCT  Date Value Ref Range Status  02/02/2021 28.2 (L) 39.0 - 52.0 % Final         Failed - HGB in normal range and within 360 days    Hemoglobin  Date Value Ref Range Status  02/02/2021 9.3 (L) 13.0 - 17.0 g/dL Final         Failed - RBC in normal range and within 360 days    RBC  Date Value Ref Range Status  02/02/2021 3.00 (L) 4.22 - 5.81 MIL/uL Final         Failed - WBC in normal range and within 360 days    WBC  Date Value Ref Range Status  02/02/2021 11.7 (H) 4.0 - 10.5 K/uL Final         Passed - PLT in normal range and within 360 days    Platelets  Date Value Ref Range Status  02/02/2021 239 150 - 400 K/uL Final         Passed - ANA Screen, Ifa, Serum in normal range and within 360 days    ANA Ab, IFA  Date Value Ref Range Status  04/23/2020 Negative  Final    Comment:    (NOTE)                                     Negative   <1:80                                     Borderline  1:80                                     Positive   >1:80 ICAP nomenclature: AC-0 For more information about Hep-2 cell patterns use ANApatterns.org, the official website for the International Consensus on Antinuclear Antibody (ANA) Patterns (ICAP). Performed At: [facility] [place], [place] [id number] Rush Farmer MD [id number]          GSUPJS - Last BP in normal range    BP Readings from Last 1 Encounters:  02/04/21 124/70         Passed - Valid encounter within last 12 months    Recent Outpatient Visits          3 months ago Controlled type 2 diabetes  mellitus with hyperglycemia, without long-term current use of insulin ([place])   [place], Charlane Ferretti, MD   6 months ago Controlled type 2 diabetes mellitus with hyperglycemia, without long-term current use of insulin ([place])   [place], Enobong, MD   8 months ago Acute right MCA stroke [facility])   [place], Levada Dy M, PA-C              tiZANidine (ZANAFLEX) 4  MG tablet 90 tablet 1    Sig: TAKE 1 TABLET(2 MG) BY MOUTH THREE TIMES DAILY     Not Delegated - Cardiovascular:  Alpha-2 Agonists - tizanidine Failed - 03/15/2021  4:00 PM      Failed - This refill cannot be delegated      Passed - Valid encounter within last 6 months    Recent Outpatient Visits          3 months ago Controlled type 2 diabetes mellitus with hyperglycemia, without long-term current use of insulin ([place])   [place], [place], MD   6 months ago Controlled type 2 diabetes mellitus with hyperglycemia, without long-term current use of insulin ([place])   [place], Enobong, MD   8 months ago Acute right MCA stroke [facility])   [place], [place], [place]

## 2021-03-15 NOTE — Telephone Encounter (Signed)
Patient asking for all refills of medicines(doesn't know the names of them) t be sent to [facility], [place], [place] [address], [phone]

## 2021-03-15 NOTE — Telephone Encounter (Signed)
Pt calling back stated medication names are hydrALAZINE (APRESOLINE) 50 MG tablet,tiZANidine (ZANAFLEX) 4 MG tablet Pt is really angry requesting refills immediatly.     [id number] [place], [place] - [address]  [address] [place] [address]  Phone: [phone] Fax: [phone]  Hours: Not open 24 hours

## 2021-04-11 ENCOUNTER — Other Ambulatory Visit: Payer: Self-pay | Admitting: Family Medicine

## 2021-04-11 DIAGNOSIS — M7502 Adhesive capsulitis of left shoulder: Secondary | ICD-10-CM

## 2021-04-11 DIAGNOSIS — I6523 Occlusion and stenosis of bilateral carotid arteries: Secondary | ICD-10-CM

## 2021-04-11 NOTE — Telephone Encounter (Signed)
Requested medication (s) are due for refill today: yes ? ?Requested medication (s) are on the active medication list: yes    ? ?Last refill: Glimepiride  03/12/21 #45 0 refills      ticagrelor 03/12/21  #60 0 refills      Zanaflex 03/15/21  #90  0 refills ? ?Future visit scheduled No  ? ?Notes to clinic:Pt needs appt. Attempted to reach, "CAll cannot be completed." Several appt cancellations in [facility].  Zanaflex not delegated. Please review. Thank you. ? ?Requested Prescriptions  ?Pending Prescriptions Disp Refills  ? glimepiride (AMARYL) 2 MG tablet [Pharmacy Med Name: GLIMEPIRIDE 2 MG TABLET] 45 tablet 0  ?  Sig: TAKE 1.5 TABLETS (3 MG TOTAL) BY MOUTH DAILY WITH BREAKFAST.*NO MORE REFILLS UNTIL OFFICE VISIT*  ?  ? Endocrinology:  Diabetes - Sulfonylureas Failed - 04/11/2021  1:24 AM  ?  ?  Failed - Cr in normal range and within 360 days  ?  Creatinine, Ser  ?Date Value Ref Range Status  ?02/03/2021 1.81 (H) 0.61 - 1.24 mg/dL Final  ?  ?  ?  ?  Passed - HBA1C is between 0 and 7.9 and within 180 days  ?  HbA1c, POC (controlled diabetic range)  ?Date Value Ref Range Status  ?11/22/2020 6.7 0.0 - 7.0 % Final  ? ?Hgb A1c MFr Bld  ?Date Value Ref Range Status  ?01/29/2021 6.5 (H) 4.8 - 5.6 % Final  ?  Comment:  ?  (NOTE) ?Pre diabetes:          5.7%-6.4% ? ?Diabetes:              >6.4% ? ?Glycemic control for   <7.0% ?adults with diabetes ?  ?  ?  ?  ?  Passed - Valid encounter within last 6 months  ?  Recent Outpatient Visits   ? ?      ? 4 months ago Controlled type 2 diabetes mellitus with hyperglycemia, without long-term current use of insulin ([place])  ? [place], Charlane Ferretti, MD  ? 7 months ago Controlled type 2 diabetes mellitus with hyperglycemia, without long-term current use of insulin ([place])  ? [place], Charlane Ferretti, MD  ? 9 months ago Acute right MCA stroke [facility])  ? [place], [place], [place]  ? ?  ?  ? ?  ?  ?   ? BRILINTA 90 MG TABS tablet [Pharmacy Med Name: BRILINTA 90 MG TABLET] 60 tablet 0  ?  Sig: TAKE 1 TABLET BY MOUTH 2 TIMES DAILY.  ?  ? Hematology: Antiplatelets - ticagrelor Failed - 04/11/2021  1:24 AM  ?  ?  Failed - Cr in normal range and within 180 days  ?  Creatinine, Ser  ?Date Value Ref Range Status  ?02/03/2021 1.81 (H) 0.61 - 1.24 mg/dL Final  ?  ?  ?  ?  Failed - HCT in normal range and within 180 days  ?  HCT  ?Date Value Ref Range Status  ?02/02/2021 28.2 (L) 39.0 - 52.0 % Final  ?  ?  ?  ?  Failed - HGB in normal range and within 180 days  ?  Hemoglobin  ?Date Value Ref Range Status  ?02/02/2021 9.3 (L) 13.0 - 17.0 g/dL Final  ?  ?  ?  ?  Passed - PLT in normal range and within 180 days  ?  Platelets  ?Date Value Ref  Range Status  ?02/02/2021 239 150 - 400 K/uL Final  ?  ?  ?  ?  Passed - Last Heart Rate in normal range  ?  Pulse Readings from Last 1 Encounters:  ?02/04/21 76  ?  ?  ?  ?  Passed - Valid encounter within last 6 months  ?  Recent Outpatient Visits   ? ?      ? 4 months ago Controlled type 2 diabetes mellitus with hyperglycemia, without long-term current use of insulin ([place])  ? [place], Charlane Ferretti, MD  ? 7 months ago Controlled type 2 diabetes mellitus with hyperglycemia, without long-term current use of insulin ([place])  ? [place], Charlane Ferretti, MD  ? 9 months ago Acute right MCA stroke [facility])  ? [place], [place], [place]  ? ?  ?  ? ?  ?  ?  ? tiZANidine (ZANAFLEX) 4 MG tablet [Pharmacy Med Name: TIZANIDINE HCL 4 MG TABLET] 90 tablet 0  ?  Sig: TAKE 1 TAB BY MOUTH 3 TIMES DAILY  ?  ? Not Delegated - Cardiovascular:  Alpha-2 Agonists - tizanidine Failed - 04/11/2021  1:24 AM  ?  ?  Failed - This refill cannot be delegated  ?  ?  Passed - Valid encounter within last 6 months  ?  Recent Outpatient Visits   ? ?      ? 4 months ago Controlled type 2 diabetes mellitus with hyperglycemia,  without long-term current use of insulin ([place])  ? [place], Charlane Ferretti, MD  ? 7 months ago Controlled type 2 diabetes mellitus with hyperglycemia, without long-term current use of insulin ([place])  ? [place], Charlane Ferretti, MD  ? 9 months ago Acute right MCA stroke [facility])  ? [place], [place], [place]  ? ?  ?  ? ?  ?  ?  ? ? ? ? ?

## 2021-05-14 ENCOUNTER — Other Ambulatory Visit: Payer: Self-pay | Admitting: Family Medicine

## 2021-05-14 DIAGNOSIS — E11618 Type 2 diabetes mellitus with other diabetic arthropathy: Secondary | ICD-10-CM

## 2021-05-14 DIAGNOSIS — I6523 Occlusion and stenosis of bilateral carotid arteries: Secondary | ICD-10-CM

## 2021-05-14 DIAGNOSIS — M7502 Adhesive capsulitis of left shoulder: Secondary | ICD-10-CM

## 2021-05-14 NOTE — Telephone Encounter (Signed)
Called pt and [facility] requesting call back to set an appointment. ?

## 2021-05-14 NOTE — Telephone Encounter (Signed)
Requested medications are due for refill today.  yes ? ?Requested medications are on the active medications list.  yes ? ?Last refill. 04/12/2021 for all 3  ? ?Future visit scheduled.   no ? ?Notes to clinic.  Courtesy refill already given. ? ? ? ?Requested Prescriptions  ?Pending Prescriptions Disp Refills  ? BRILINTA 90 MG TABS tablet [Pharmacy Med Name: BRILINTA 90 MG TABLET] 60 tablet 0  ?  Sig: TAKE 1 TABLET BY MOUTH TWICE A DAY  ?  ? Hematology: Antiplatelets - ticagrelor Failed - 05/14/2021  1:53 AM  ?  ?  Failed - Cr in normal range and within 180 days  ?  Creatinine, Ser  ?Date Value Ref Range Status  ?02/03/2021 1.81 (H) 0.61 - 1.24 mg/dL Final  ?  ?  ?  ?  Failed - HCT in normal range and within 180 days  ?  HCT  ?Date Value Ref Range Status  ?02/02/2021 28.2 (L) 39.0 - 52.0 % Final  ?  ?  ?  ?  Failed - HGB in normal range and within 180 days  ?  Hemoglobin  ?Date Value Ref Range Status  ?02/02/2021 9.3 (L) 13.0 - 17.0 g/dL Final  ?  ?  ?  ?  Passed - PLT in normal range and within 180 days  ?  Platelets  ?Date Value Ref Range Status  ?02/02/2021 239 150 - 400 K/uL Final  ?  ?  ?  ?  Passed - Last Heart Rate in normal range  ?  Pulse Readings from Last 1 Encounters:  ?02/04/21 76  ?  ?  ?  ?  Passed - Valid encounter within last 6 months  ?  Recent Outpatient Visits   ? ?      ? 5 months ago Controlled type 2 diabetes mellitus with hyperglycemia, without long-term current use of insulin ([place])  ? [place], Charlane Ferretti, MD  ? 8 months ago Controlled type 2 diabetes mellitus with hyperglycemia, without long-term current use of insulin ([place])  ? [place], Charlane Ferretti, MD  ? 10 months ago Acute right MCA stroke [facility])  ? [place], [place], [place]  ? ?  ?  ? ?  ?  ?  ? tiZANidine (ZANAFLEX) 4 MG tablet [Pharmacy Med Name: TIZANIDINE HCL 4 MG TABLET] 90 tablet 0  ?  Sig: TAKE 1 TABLET BY MOUTH THREE TIMES  A DAY  ?  ? Not Delegated - Cardiovascular:  Alpha-2 Agonists - tizanidine Failed - 05/14/2021  1:53 AM  ?  ?  Failed - This refill cannot be delegated  ?  ?  Passed - Valid encounter within last 6 months  ?  Recent Outpatient Visits   ? ?      ? 5 months ago Controlled type 2 diabetes mellitus with hyperglycemia, without long-term current use of insulin ([place])  ? [place], Charlane Ferretti, MD  ? 8 months ago Controlled type 2 diabetes mellitus with hyperglycemia, without long-term current use of insulin ([place])  ? [place], Charlane Ferretti, MD  ? 10 months ago Acute right MCA stroke [facility])  ? [place], [place], [place]  ? ?  ?  ? ?  ?  ?  ? glimepiride (AMARYL) 2 MG tablet [Pharmacy Med Name: GLIMEPIRIDE 2 MG TABLET] 45 tablet 0  ?  Sig: TAKE 1.5 TABLETS (3 MG TOTAL) BY MOUTH DAILY WITH BREAKFAST.*NO MORE REFILLS UNTIL OFFICE VISIT*  ?  ? Endocrinology:  Diabetes - Sulfonylureas Failed - 05/14/2021  1:53 AM  ?  ?  Failed - Cr in normal range and within 360 days  ?  Creatinine, Ser  ?Date Value Ref Range Status  ?02/03/2021 1.81 (H) 0.61 - 1.24 mg/dL Final  ?  ?  ?  ?  Passed - HBA1C is between 0 and 7.9 and within 180 days  ?  HbA1c, POC (controlled diabetic range)  ?Date Value Ref Range Status  ?11/22/2020 6.7 0.0 - 7.0 % Final  ? ?Hgb A1c MFr Bld  ?Date Value Ref Range Status  ?01/29/2021 6.5 (H) 4.8 - 5.6 % Final  ?  Comment:  ?  (NOTE) ?Pre diabetes:          5.7%-6.4% ? ?Diabetes:              >6.4% ? ?Glycemic control for   <7.0% ?adults with diabetes ?  ?  ?  ?  ?  Passed - Valid encounter within last 6 months  ?  Recent Outpatient Visits   ? ?      ? 5 months ago Controlled type 2 diabetes mellitus with hyperglycemia, without long-term current use of insulin ([place])  ? [place], Charlane Ferretti, MD  ? 8 months ago Controlled type 2 diabetes mellitus with hyperglycemia, without  long-term current use of insulin ([place])  ? [place], Charlane Ferretti, MD  ? 10 months ago Acute right MCA stroke [facility])  ? [place], [place], [place]  ? ?  ?  ? ?  ?  ?  ?  ?

## 2021-06-06 ENCOUNTER — Ambulatory Visit: Payer: Medicaid Other | Attending: Physician Assistant | Admitting: Physician Assistant

## 2021-06-06 DIAGNOSIS — E1122 Type 2 diabetes mellitus with diabetic chronic kidney disease: Secondary | ICD-10-CM

## 2021-06-06 DIAGNOSIS — E1165 Type 2 diabetes mellitus with hyperglycemia: Secondary | ICD-10-CM

## 2021-06-06 DIAGNOSIS — E11618 Type 2 diabetes mellitus with other diabetic arthropathy: Secondary | ICD-10-CM

## 2021-06-06 DIAGNOSIS — E7800 Pure hypercholesterolemia, unspecified: Secondary | ICD-10-CM

## 2021-06-06 DIAGNOSIS — I6523 Occlusion and stenosis of bilateral carotid arteries: Secondary | ICD-10-CM

## 2021-06-06 DIAGNOSIS — I129 Hypertensive chronic kidney disease with stage 1 through stage 4 chronic kidney disease, or unspecified chronic kidney disease: Secondary | ICD-10-CM

## 2021-06-06 DIAGNOSIS — I10 Essential (primary) hypertension: Secondary | ICD-10-CM

## 2021-06-06 DIAGNOSIS — N184 Chronic kidney disease, stage 4 (severe): Secondary | ICD-10-CM

## 2021-06-06 DIAGNOSIS — M7502 Adhesive capsulitis of left shoulder: Secondary | ICD-10-CM

## 2021-06-06 MED ORDER — TICAGRELOR 90 MG PO TABS: 90.0000 mg | ORAL_TABLET | Freq: Two times a day (BID) | ORAL | 0 refills | Status: DC

## 2021-06-06 MED ORDER — ALBUTEROL SULFATE HFA 108 (90 BASE) MCG/ACT IN AERS: 1.0000 | INHALATION_SPRAY | Freq: Four times a day (QID) | RESPIRATORY_TRACT | 2 refills | Status: DC | PRN

## 2021-06-06 MED ORDER — CARVEDILOL 12.5 MG PO TABS: 12.5000 mg | ORAL_TABLET | Freq: Two times a day (BID) | ORAL | 6 refills | Status: DC

## 2021-06-06 MED ORDER — GLIMEPIRIDE 2 MG PO TABS: ORAL_TABLET | ORAL | 0 refills | Status: DC

## 2021-06-06 MED ORDER — HYDRALAZINE HCL 50 MG PO TABS: 50.0000 mg | ORAL_TABLET | Freq: Two times a day (BID) | ORAL | 0 refills | Status: AC

## 2021-06-06 MED ORDER — TIZANIDINE HCL 4 MG PO TABS: ORAL_TABLET | ORAL | 0 refills | Status: DC

## 2021-06-06 MED ORDER — LOSARTAN POTASSIUM 25 MG PO TABS: 25.0000 mg | ORAL_TABLET | Freq: Every day | ORAL | 0 refills | Status: AC

## 2021-06-06 MED ORDER — ATORVASTATIN CALCIUM 80 MG PO TABS: 80.0000 mg | ORAL_TABLET | Freq: Every day | ORAL | 0 refills | Status: DC

## 2021-06-06 NOTE — Progress Notes (Signed)
Patient ID: Bobby Branch, male   DOB: 07-Oct-1960, 61 y.o.   MRN: [id number] ?Virtual Visit via Telephone Note ? ?I connected with Bobby Branch on 06/06/21 at  2:50 PM EDT by telephone and verified that I am speaking with the correct person using two identifiers. ? ?Location: ?Patient: home ?Provider: [facility] office ?  ?I discussed the limitations, risks, security and privacy concerns of performing an evaluation and management service by telephone and the availability of in person appointments. I also discussed with the patient that there may be a patient responsible charge related to this service. The patient expressed understanding and agreed to proceed. ? ? ?History of Present Illness: ?Blood sugars 87-110 over last 2 weeks.  No hyper or hypoglycemia.   ? ?140/80s is BP OOO ? ?Complaint with meds.  He has been in [facility] and needs meds sent there.   ?  ?Observations/Objective:  NAD, mumbles at times and difficult to follow at times ? ? ?Assessment and Plan: ?1. Essential hypertension ?- hydrALAZINE (APRESOLINE) 50 MG tablet; Take 1 tablet (50 mg total) by mouth in the morning and at bedtime.  Dispense: 180 tablet; Refill: 0 ?- losartan (COZAAR) 25 MG tablet; Take 1 tablet (25 mg total) by mouth daily.  Dispense: 90 tablet; Refill: 0 ? ?2. 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  Dispense: 60 tablet; Refill: 6 ? ? ?4. Controlled type 2 diabetes mellitus with hyperglycemia, without long-term current use of insulin (HCC) ?- glimepiride (AMARYL) 2 MG tablet; TAKE 1.5 TABLETS (3 MG TOTAL) BY MOUTH DAILY WITH BREAKFAST.*NO MORE REFILLS UNTIL OFFICE VISIT*  Dispense: 135 tablet; Refill: 0 ? ?5. Hypercholesteremia ?- atorvastatin (LIPITOR) 80 MG tablet; Take 1 tablet (80 mg total) by mouth daily.  Dispense: 90 tablet; Refill: 0 ? ?6. 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 ? ? ?Follow Up Instructions: ?Has an appt in [place] keep appt/needs labs ?  ?I discussed the assessment and treatment plan with the patient. The patient was provided an opportunity to ask questions and all were answered. The patient agreed with the plan and demonstrated an understanding of the instructions. ?  ?The patient was advised to call back or seek an in-person evaluation if the symptoms worsen or if the condition fails to improve as anticipated. ? ?I provided 20 minutes of non-face-to-face time during this encounter. ? ? ?Freeman Caldron, PA-C ? ? ?

## 2021-06-10 ENCOUNTER — Other Ambulatory Visit: Payer: Self-pay

## 2021-06-10 ENCOUNTER — Other Ambulatory Visit: Payer: Self-pay | Admitting: Family Medicine

## 2021-06-10 DIAGNOSIS — E7800 Pure hypercholesterolemia, unspecified: Secondary | ICD-10-CM

## 2021-06-10 DIAGNOSIS — I6523 Occlusion and stenosis of bilateral carotid arteries: Secondary | ICD-10-CM

## 2021-06-10 DIAGNOSIS — E1165 Type 2 diabetes mellitus with hyperglycemia: Secondary | ICD-10-CM

## 2021-06-10 DIAGNOSIS — M7502 Adhesive capsulitis of left shoulder: Secondary | ICD-10-CM

## 2021-06-10 DIAGNOSIS — E1122 Type 2 diabetes mellitus with diabetic chronic kidney disease: Secondary | ICD-10-CM

## 2021-06-10 DIAGNOSIS — I10 Essential (primary) hypertension: Secondary | ICD-10-CM

## 2021-06-10 MED ORDER — CARVEDILOL 12.5 MG PO TABS
12.5000 mg | ORAL_TABLET | Freq: Two times a day (BID) | ORAL | 6 refills | Status: AC
  Filled 2021-06-10: qty 60, 30d supply, fill #0

## 2021-06-10 MED ORDER — TICAGRELOR 90 MG PO TABS
90.0000 mg | ORAL_TABLET | Freq: Two times a day (BID) | ORAL | 0 refills | Status: AC
  Filled 2021-06-10: qty 180, 90d supply, fill #0
  Filled 2021-06-12: qty 60, 30d supply, fill #0

## 2021-06-10 MED ORDER — HYDRALAZINE HCL 50 MG PO TABS
50.0000 mg | ORAL_TABLET | Freq: Three times a day (TID) | ORAL | 2 refills | Status: AC
  Filled 2021-06-10 – 2021-06-20 (×2): qty 90, 30d supply, fill #0

## 2021-06-10 MED ORDER — ATORVASTATIN CALCIUM 80 MG PO TABS
80.0000 mg | ORAL_TABLET | Freq: Every day | ORAL | 0 refills | Status: AC
  Filled 2021-06-10: qty 90, 90d supply, fill #0

## 2021-06-10 MED ORDER — TIZANIDINE HCL 4 MG PO TABS
ORAL_TABLET | ORAL | 0 refills | Status: AC
  Filled 2021-06-10: qty 90, 30d supply, fill #0

## 2021-06-10 MED ORDER — ALBUTEROL SULFATE HFA 108 (90 BASE) MCG/ACT IN AERS
1.0000 | INHALATION_SPRAY | Freq: Four times a day (QID) | RESPIRATORY_TRACT | 2 refills | Status: AC | PRN
Start: 2021-06-10 — End: ?
  Filled 2021-06-10: qty 18, 30d supply, fill #0

## 2021-06-10 MED ORDER — GLIMEPIRIDE 2 MG PO TABS
ORAL_TABLET | ORAL | 0 refills | Status: DC
  Filled 2021-06-10 – 2021-06-12 (×4): qty 135, 90d supply, fill #0

## 2021-06-10 MED ORDER — LOSARTAN POTASSIUM 25 MG PO TABS
25.0000 mg | ORAL_TABLET | Freq: Every day | ORAL | 1 refills | Status: AC
Start: 2021-06-10 — End: ?
  Filled 2021-06-10: qty 90, 90d supply, fill #0

## 2021-06-10 NOTE — Telephone Encounter (Signed)
Requested Prescriptions  ?Pending Prescriptions Disp Refills  ?? albuterol (PROAIR HFA) 108 (90 Base) MCG/ACT inhaler 18 g 2  ?  Sig: Inhale 1 puff into the lungs every 6 (six) hours as needed for wheezing or shortness of breath.  ?  ? Pulmonology:  Beta Agonists 2 Passed - 06/10/2021  2:16 PM  ?  ?  Passed - Last BP in normal range  ?  BP Readings from Last 1 Encounters:  ?02/04/21 124/70  ?   ?  ?  Passed - Last Heart Rate in normal range  ?  Pulse Readings from Last 1 Encounters:  ?02/04/21 76  ?   ?  ?  Passed - Valid encounter within last 12 months  ?  Recent Outpatient Visits   ?      ? 4 days ago Controlled type 2 diabetes mellitus with hyperglycemia, without long-term current use of insulin ([place])  ? [place], [place], [place]  ? 6 months ago Controlled type 2 diabetes mellitus with hyperglycemia, without long-term current use of insulin ([place])  ? [place], Charlane Ferretti, MD  ? 9 months ago Controlled type 2 diabetes mellitus with hyperglycemia, without long-term current use of insulin ([place])  ? [place], Charlane Ferretti, MD  ? 11 months ago Acute right MCA stroke [facility])  ? [place], [place], [place]  ?  ?  ?Future Appointments   ?        ? In 3 months Charlott Rakes, MD [place]  ?  ? ?  ?  ?  ?? atorvastatin (LIPITOR) 80 MG tablet 90 tablet 0  ?  Sig: Take 1 tablet (80 mg total) by mouth daily.  ?  ? Cardiovascular:  Antilipid - Statins Failed - 06/10/2021  2:16 PM  ?  ?  Failed - Lipid Panel in normal range within the last 12 months  ?  Cholesterol  ?Date Value Ref Range Status  ?01/29/2021 197 0 - 200 mg/dL Final  ? ?LDL Cholesterol  ?Date Value Ref Range Status  ?01/29/2021 114 (H) 0 - 99 mg/dL Final  ?  Comment:  ?         ?Total Cholesterol/HDL:CHD Risk ?Coronary Heart Disease Risk Table ?                    Men   Women ? 1/2 Average  Risk   3.4   3.3 ? Average Risk       5.0   4.4 ? 2 X Average Risk   9.6   7.1 ? 3 X Average Risk  23.4   11.0 ?       ?Use the calculated Patient Ratio ?above and the CHD Risk Table ?to determine the patient's CHD Risk. ?       ?ATP III CLASSIFICATION (LDL): ? <100     mg/dL   Optimal ? 100-129  mg/dL   Near or Above ?                   Optimal ? 130-159  mg/dL   Borderline ? 160-189  mg/dL   High ? >190     mg/dL   Very High ?Performed at [facility], [place] [address] [place], [place] [address] ?  ? ?HDL  ?Date Value Ref Range Status  ?01/29/2021 45 >40 mg/dL  Final  ? ?Triglycerides  ?Date Value Ref Range Status  ?01/29/2021 191 (H) <150 mg/dL Final  ? ?  ?  ?  Passed - Patient is not pregnant  ?  ?  Passed - Valid encounter within last 12 months  ?  Recent Outpatient Visits   ?      ? 4 days ago Controlled type 2 diabetes mellitus with hyperglycemia, without long-term current use of insulin ([place])  ? [place], [place], [place]  ? 6 months ago Controlled type 2 diabetes mellitus with hyperglycemia, without long-term current use of insulin ([place])  ? [place], Charlane Ferretti, MD  ? 9 months ago Controlled type 2 diabetes mellitus with hyperglycemia, without long-term current use of insulin ([place])  ? [place], Charlane Ferretti, MD  ? 11 months ago Acute right MCA stroke [facility])  ? [place], [place], [place]  ?  ?  ?Future Appointments   ?        ? In 3 months Charlott Rakes, MD [place]  ?  ? ?  ?  ?  ?? carvedilol (COREG) 12.5 MG tablet 60 tablet 6  ?  Sig: Take 1 tablet (12.5 mg total) by mouth 2 (two) times daily with a meal.  ?  ? Cardiovascular: Beta Blockers 3 Failed - 06/10/2021  2:16 PM  ?  ?  Failed - Cr in normal range and within 360 days  ?  Creatinine, Ser  ?Date Value Ref Range Status  ?02/03/2021 1.81 (H) 0.61 - 1.24 mg/dL Final  ?    ?  ?  Passed - AST in normal range and within 360 days  ?  AST  ?Date Value Ref Range Status  ?01/27/2021 23 15 - 41 U/L Final  ?   ?  ?  Passed - ALT in normal range and within 360 days  ?  ALT  ?Date Value Ref Range Status  ?01/27/2021 22 0 - 44 U/L Final  ?   ?  ?  Passed - Last BP in normal range  ?  BP Readings from Last 1 Encounters:  ?02/04/21 124/70  ?   ?  ?  Passed - Last Heart Rate in normal range  ?  Pulse Readings from Last 1 Encounters:  ?02/04/21 76  ?   ?  ?  Passed - Valid encounter within last 6 months  ?  Recent Outpatient Visits   ?      ? 4 days ago Controlled type 2 diabetes mellitus with hyperglycemia, without long-term current use of insulin ([place])  ? [place], [place], [place]  ? 6 months ago Controlled type 2 diabetes mellitus with hyperglycemia, without long-term current use of insulin ([place])  ? [place], Charlane Ferretti, MD  ? 9 months ago Controlled type 2 diabetes mellitus with hyperglycemia, without long-term current use of insulin ([place])  ? [place], Charlane Ferretti, MD  ? 11 months ago Acute right MCA stroke [facility])  ? [place], [place], [place]  ?  ?  ?Future Appointments   ?        ? In 3 months Charlott Rakes, MD [place]  ?  ? ?  ?  ?  ?? glimepiride (AMARYL)  2 MG tablet 135 tablet 0  ?  Sig: TAKE 1.5 TABLETS (3 MG TOTAL) BY MOUTH DAILY WITH BREAKFAST.  ?  ? Endocrinology:  Diabetes - Sulfonylureas Failed - 06/10/2021  2:16 PM  ?  ?  Failed - Cr in normal range and within 360 days  ?  Creatinine, Ser  ?Date Value Ref Range Status  ?02/03/2021 1.81 (H) 0.61 - 1.24 mg/dL Final  ?   ?  ?  Passed - HBA1C is between 0 and 7.9 and within 180 days  ?  HbA1c, POC (controlled diabetic range)  ?Date Value Ref Range Status  ?11/22/2020 6.7 0.0 - 7.0 % Final  ? ?Hgb A1c MFr Bld  ?Date Value Ref Range Status  ?01/29/2021 6.5  (H) 4.8 - 5.6 % Final  ?  Comment:  ?  (NOTE) ?Pre diabetes:          5.7%-6.4% ? ?Diabetes:              >6.4% ? ?Glycemic control for   <7.0% ?adults with diabetes ?  ?   ?  ?  Passed - Valid encounter within last 6 months  ?  Recent Outpatient Visits   ?      ? 4 days ago Controlled type 2 diabetes mellitus with hyperglycemia, without long-term current use of insulin ([place])  ? [place], [place], [place]  ? 6 months ago Controlled type 2 diabetes mellitus with hyperglycemia, without long-term current use of insulin ([place])  ? [place], Charlane Ferretti, MD  ? 9 months ago Controlled type 2 diabetes mellitus with hyperglycemia, without long-term current use of insulin ([place])  ? [place], Charlane Ferretti, MD  ? 11 months ago Acute right MCA stroke [facility])  ? [place], [place], [place]  ?  ?  ?Future Appointments   ?        ? In 3 months Charlott Rakes, MD [place]  ?  ? ?  ?  ?  ?? hydrALAZINE (APRESOLINE) 50 MG tablet 90 tablet 2  ?  Sig: Take 1 tablet (50 mg total) by mouth 3 (three) times daily.  ?  ? Cardiovascular:  Vasodilators Failed - 06/10/2021  2:16 PM  ?  ?  Failed - HCT in normal range and within 360 days  ?  HCT  ?Date Value Ref Range Status  ?02/02/2021 28.2 (L) 39.0 - 52.0 % Final  ?   ?  ?  Failed - HGB in normal range and within 360 days  ?  Hemoglobin  ?Date Value Ref Range Status  ?02/02/2021 9.3 (L) 13.0 - 17.0 g/dL Final  ?   ?  ?  Failed - RBC in normal range and within 360 days  ?  RBC  ?Date Value Ref Range Status  ?02/02/2021 3.00 (L) 4.22 - 5.81 MIL/uL Final  ?   ?  ?  Failed - WBC in normal range and within 360 days  ?  WBC  ?Date Value Ref Range Status  ?02/02/2021 11.7 (H) 4.0 - 10.5 K/uL Final  ?   ?  ?  Failed - ANA Screen, Ifa, Serum in normal range and within 360 days  ?  ANA Ab, IFA  ?Date Value Ref Range Status   ?04/23/2020 Negative  Final  ?  Comment:  ?  (NOTE) ?  Negative   <1:80 ?                                    Borderline  1:80 ?                                    [place]

## 2021-06-10 NOTE — Telephone Encounter (Signed)
Pts med refill were sent to a pharmacy in [facility] but they needed to go to  ?[place] [facility] Phone:  [phone]  ?Fax:  [phone]  ?  ?/ please resend RX for glimepiride (AMARYL) 2 MG tablet  ? ?hydrALAZINE (APRESOLINE) 50 MG tablet  ? ?losartan (COZAAR) 25 MG tablet ? ?atorvastatin (LIPITOR) 80 MG  ? ?carvedilol (COREG) 12.5 MG tablet  ? ?ticagrelor (BRILINTA) 90 MG TABS tablet  ? ?tiZANidine (ZANAFLEX) 4 MG tablet  ? ?albuterol (PROAIR HFA) 108 (90 Base) MCG/ACT inhaler ? ?

## 2021-06-11 ENCOUNTER — Other Ambulatory Visit: Payer: Self-pay

## 2021-06-12 ENCOUNTER — Other Ambulatory Visit: Payer: Self-pay

## 2021-06-12 ENCOUNTER — Telehealth: Payer: Self-pay | Admitting: Family Medicine

## 2021-06-12 NOTE — Telephone Encounter (Signed)
Copied from [place] [id number]. Topic: General - Other ?>> Jun 12, 2021  1:29 PM Pawlus, Brayton Layman A wrote: ?Reason for CRM: Pt called in requesting home health orders for PT and OT, pt wanted to know if Dr Margarita Rana would place these orders for him. ?

## 2021-06-12 NOTE — Telephone Encounter (Signed)
Levada Dy, can you please put in these orders as his insurance requires a face-to-face visit within 90 days by the ordering clinician and his last face-to-face visit with me was in 11/2020?  Thanks. ?

## 2021-06-13 ENCOUNTER — Other Ambulatory Visit: Payer: Self-pay | Admitting: Physician Assistant

## 2021-06-13 ENCOUNTER — Other Ambulatory Visit: Payer: Self-pay

## 2021-06-13 DIAGNOSIS — I6523 Occlusion and stenosis of bilateral carotid arteries: Secondary | ICD-10-CM

## 2021-06-13 DIAGNOSIS — E1165 Type 2 diabetes mellitus with hyperglycemia: Secondary | ICD-10-CM

## 2021-06-13 MED ORDER — GLIMEPIRIDE 2 MG PO TABS
ORAL_TABLET | ORAL | 2 refills | Status: AC
  Filled 2021-06-13: qty 135, fill #0
  Filled 2021-06-13: qty 135, 90d supply, fill #0

## 2021-06-13 MED ORDER — GLIMEPIRIDE 2 MG PO TABS
ORAL_TABLET | ORAL | 2 refills | Status: DC
  Filled 2021-06-13: qty 135, fill #0

## 2021-06-13 NOTE — Telephone Encounter (Signed)
noted 

## 2021-06-13 NOTE — Telephone Encounter (Signed)
I called patient to inquire about his request for home health services. He said that he does not want home health, he wants to go back to [place] for PT/OT/ST.  He said he is familiar with the therapists there and wants outpatient therapy.  ? ?He also requested a refill of glimepiride.  ?

## 2021-06-14 ENCOUNTER — Other Ambulatory Visit: Payer: Self-pay

## 2021-06-17 ENCOUNTER — Other Ambulatory Visit: Payer: Self-pay

## 2021-06-20 ENCOUNTER — Other Ambulatory Visit: Payer: Self-pay

## 2021-06-27 ENCOUNTER — Other Ambulatory Visit: Payer: Self-pay

## 2021-07-25 ENCOUNTER — Other Ambulatory Visit: Payer: Self-pay

## 2021-07-26 ENCOUNTER — Encounter (HOSPITAL_COMMUNITY): Payer: Self-pay

## 2021-07-26 ENCOUNTER — Emergency Department (HOSPITAL_COMMUNITY)

## 2021-07-26 ENCOUNTER — Other Ambulatory Visit: Payer: Self-pay

## 2021-07-26 ENCOUNTER — Emergency Department
Admission: EM | Admit: 2021-07-26 | Discharge: 2021-07-27 | Disposition: A | Discharge status: Home / Self Care | Attending: Emergency Medicine | Admitting: Emergency Medicine

## 2021-07-26 DIAGNOSIS — Z8673 Personal history of transient ischemic attack (TIA), and cerebral infarction without residual deficits: Secondary | ICD-10-CM

## 2021-07-26 DIAGNOSIS — F17210 Nicotine dependence, cigarettes, uncomplicated: Secondary | ICD-10-CM

## 2021-07-26 DIAGNOSIS — I10 Essential (primary) hypertension: Secondary | ICD-10-CM

## 2021-07-26 DIAGNOSIS — I129 Hypertensive chronic kidney disease with stage 1 through stage 4 chronic kidney disease, or unspecified chronic kidney disease: Secondary | ICD-10-CM

## 2021-07-26 DIAGNOSIS — N189 Chronic kidney disease, unspecified: Secondary | ICD-10-CM

## 2021-07-26 HISTORY — DX: Essential (primary) hypertension: I10

## 2021-07-26 HISTORY — DX: Pure hypercholesterolemia, unspecified: E78.00

## 2021-07-26 HISTORY — DX: Type 2 diabetes mellitus without complications (CMS HCC): E11.9

## 2021-07-26 HISTORY — DX: Gastro-esophageal reflux disease without esophagitis: K21.9

## 2021-07-26 HISTORY — DX: Cerebral infarction, unspecified (CMS HCC): I63.9

## 2021-07-26 LAB — BASIC METABOLIC PANEL
ANION GAP: 7 mmol/L (ref 4–13)
BUN/CREA RATIO: 10 (ref 6–22)
BUN: 26 mg/dL — ABNORMAL HIGH (ref 8–25)
CALCIUM: 9 mg/dL (ref 8.5–10.0)
CHLORIDE: 110 mmol/L (ref 96–111)
CO2 TOTAL: 26 mmol/L (ref 22–30)
CREATININE: 2.59 mg/dL — ABNORMAL HIGH (ref 0.75–1.35)
ESTIMATED GFR: 28 mL/min/BSA — ABNORMAL LOW (ref >=60)
GLUCOSE: 144 mg/dL — ABNORMAL HIGH (ref 65–125)
POTASSIUM: 4.1 mmol/L (ref 3.5–5.1)
SODIUM: 143 mmol/L (ref 136–145)

## 2021-07-26 LAB — CBC WITH DIFF
BASOPHIL #: <0.1 10*3/uL (ref <=0.20)
BASOPHIL %: 0 %
EOSINOPHIL #: 0.28 10*3/uL (ref <=0.50)
EOSINOPHIL %: 4 %
HCT: 34.2 % — ABNORMAL LOW (ref 38.9–52.0)
HGB: 11.2 g/dL — ABNORMAL LOW (ref 13.4–17.5)
IMMATURE GRANULOCYTE #: <0.1 10*3/uL (ref <0.10)
IMMATURE GRANULOCYTE %: 0 % (ref 0–1)
LYMPHOCYTE #: 2.25 10*3/uL (ref 1.00–4.80)
LYMPHOCYTE %: 28 %
MCH: 30.5 pg (ref 26.0–32.0)
MCHC: 32.7 g/dL (ref 31.0–35.5)
MCV: 93.2 fL (ref 78.0–100.0)
MONOCYTE #: 0.62 10*3/uL (ref 0.20–1.10)
MONOCYTE %: 8 %
MPV: 9.8 fL (ref 8.7–12.5)
NEUTROPHIL #: 4.78 10*3/uL (ref 1.50–7.70)
NEUTROPHIL %: 60 %
PLATELETS: 314 10*3/uL (ref 150–400)
RBC: 3.67 10*6/uL — ABNORMAL LOW (ref 4.50–6.10)
RDW-CV: 15.7 % — ABNORMAL HIGH (ref 11.5–15.5)
WBC: 8 10*3/uL (ref 3.7–11.0)

## 2021-07-26 LAB — TROPONIN-I: TROPONIN I: 23 ng/L (ref <=30)

## 2021-07-26 MED ORDER — HYDRALAZINE 20 MG/ML INJECTION SOLUTION
20.0000 mg | INTRAMUSCULAR | Status: AC
Start: 2021-07-26 — End: 2021-07-26
  Administered 2021-07-26: 20 mg via INTRAVENOUS
  Filled 2021-07-26: qty 1

## 2021-07-26 NOTE — ED Nurses Note (Signed)
Lying in stretcher with HOB elevated for comfort. Resting quietly when undisturbed. Food box given per okay of physician. Call light in reach on bed rail, stretcher is low and locked.Gae Gallop, RN

## 2021-07-26 NOTE — ED Provider Notes (Signed)
Emergency Department Provider Note  HPI - 07/26/2021    COVID-19 PANDEMIC IN EFFECT    History of Present Illness      Michael Christensen 61 y.o. male  Date of Birth: Aug 03, 1960     Attending: Dr. Jackolyn Confer    PCP: No primary care provider on file.      Chief Complaint   Patient presents with   . Hypertension       Initial ED provider evaluation performed at 2229    History Provided by: Patient      HPI:  Michael Christensen is a 61 y.o. male who presents to the ED today for hypertension. Patient arrives to the ED via private transportation. Pt reports that he is currently staying at [facility] and had his blood pressure taken by staff just prior to arrival. He notes it was 198/106 around 2000 today. Pt states the facility wouldn't give him his Hydralazine because it wasn't in the original container. He advises they offered him Clonidine, but he refused it at the facility. Pt currently on complains of fatigue, he denies any headaches or dizziness. Please see nurses note for complete medical/surgical history and allergy list. Pt has no other concerns or complaints at time of initial ED provider evaluation.    Review of Systems     Review of Systems:  Constitutional: Fatigue. No fever or chills.  Skin: No rashes or itching.  HENT: No head injury. No sore throat, ear pain, ear discharge, or difficulty swallowing.  Eyes: No vision changes, redness, or eye discharge.  Cardiovascular: Hypertension. No chest pain or palpitations. No leg swelling.  Respiratory: No cough or SOB.  GI/abdomen: No abdominal pain. No nausea or vomiting. No diarrhea or constipation.  GU:  No dysuria or hematuria. No decreased urine output.  MSK/Extremities: No neck or back pain. No extremity pain.   Neuro: No headache. No dizziness, speech change or LOC.   Psych: No SI, HI or substance abuse.  All other systems reviewed and are negative, unless commented on in the HPI.      Past Medical History     Filed Vitals:    07/26/21 2139 07/26/21 2155   BP:  (!) 187/107 (!) 188/113   Pulse: 89    Resp: 17    Temp: 37.1 C (98.8 F)    SpO2: 99%        PMHx:    Medical History     Diagnosis Date Comment Source    CVA (cerebrovascular accident) (CMS HCC)       Diabetes mellitus, type 2 (CMS HCC)       GERD (gastroesophageal reflux disease)       HTN (hypertension)       Hypercholesterolemia            Allergies:    Allergies   Allergen Reactions   . Pork Derived (Porcine)      H/A       Social History  Social History     Tobacco Use   . Smoking status: Every Day     Packs/day: 0.50     Types: Cigarettes   . Smokeless tobacco: Never   Vaping Use   . Vaping Use: Never used   Substance Use Topics   . Alcohol use: Not Currently   . Drug use: Not Currently       Family History  Family Medical History:    None        Home Meds:  No current facility-administered medications for this encounter.    Current Outpatient Medications:   .  aspirin (ECOTRIN) 81 mg Oral Tablet, Delayed Release (E.C.), Take 1 Tablet (81 mg total) by mouth, Disp: , Rfl:   .  atorvastatin (LIPITOR) 80 mg Oral Tablet, Take 1 Tablet (80 mg total) by mouth, Disp: , Rfl:   .  carvediloL (COREG) 12.5 mg Oral Tablet, Take 1 Tablet (12.5 mg total) by mouth, Disp: , Rfl:   .  glimepiride (AMARYL) 2 mg Oral Tablet, Take by mouth, Disp: , Rfl:   .  hydrALAZINE (APRESOLINE) 50 mg Oral Tablet, Take 1 Tablet (50 mg total) by mouth, Disp: , Rfl:   .  losartan (COZAAR) 25 mg Oral Tablet, Take 1 Tablet (25 mg total) by mouth Once a day, Disp: , Rfl:   .  pantoprazole (PROTONIX) 40 mg Oral Tablet, Delayed Release (E.C.), Take 1 Tablet (40 mg total) by mouth, Disp: , Rfl:   .  ticagrelor (BRILINTA) 90 mg Oral Tablet, Take 1 Tablet (90 mg total) by mouth Twice daily, Disp: , Rfl:   .  tiZANidine (ZANAFLEX) 4 mg Oral Tablet, Take 1 Tablet (4 mg total) by mouth, Disp: , Rfl:           Exam and Objective Findings     Physical Exam   Nursing note and vitals reviewed.  Vital signs reviewed as above.     Constitutional: Pt is  awake, alert, and in no acute distress.   Skin: Warm and dry. No rash or lesions.  HENT: Atraumatic. Moist mucous membranes.  Eyes: Conjunctivae are normal. Pupils are equal, round, and reactive to light.  Cardiovascular: Regular rate. Normal rhythm. Distal pulses intact bilaterally. No edema in legs.  Respiratory: Breath sounds normal. Effort normal. No respiratory distress.   GI/abdomen: Abdomen is soft, nontender, nondistended. Bowel sounds are normal. No rebound, guarding, or masses.   MSK/Extremities: Moves all extremities. No obvious deformities. No tenderness or erythema.  Neurological: Patient is alert and oriented to person, place and time. No neurological deficits.  Psychiatric: Patient has a normal mood and affect.     Orders     Work-up:  No orders of the defined types were placed in this encounter.       Labs     Labs: ***  No results found for this or any previous visit (from the past 24 hour(s)).    Abnormal Lab results:  Labs Ordered/Reviewed - No data to display    Imaging     Imaging: ***       EKG's     ECG:     Date/Time ECG Read: {Month:16018439}\{DAY:16018440}\ 20{YEAR:16018441::"18"} {HOURS:16023010}:{MINUTES:16023011}         No results found for this visit on 07/26/21 (from the past 720 hour(s)).    Procedures:     ***    Assessment & Plan     Plan: Appropriate testing. Medical Records reviewed.    MDM           During the patient's stay in the emergency department, the above listed testing was performed to assist with medical decision making, and were reviewed by myself when available for review.    Pt remained stable throughout the emergency department course.      Consults     Consults:     Secure chat started requesting patient admission to the hospitalist service.    ***: Dr. Marland Kitchen agrees to admit the patient for further evaluation and management  after consideration of all results and the above noted symptoms.  Disposition     Impression:   Diagnosis    None       Disposition:  Data  Unavailable     Patient will be admitted to *** for further evaluation and management.     Care of the patient was transferred to *** at *** with *** pending.   Discussed results,*** diagnosis, and treatment with the patient.   Medication instructions were discussed with the patient.***   It was advised that the patient return to the ED with any new, concerning, or worsening symptoms, and follow up as directed.    The patient verbalized understanding of all instructions and had no further questions or concerns.     Follow up:   No follow-up provider specified.    Prescriptions     Prescriptions: ***  Current Discharge Medication List          Attestations     I am scribing for, and in the presence of, Dr. Jackolyn Confer for services provided on 07/26/2021.  [place], SCRIBE    [place], SCRIBE 07/26/2021, 22:38        Don't forget .tgscribe AND .sign***    This note may have been partially generated using MModal Fluency Direct system, and there may be some incorrect words, spellings, and punctuation that were not noted in checking the chart before saving.
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
## 2021-07-27 MED ORDER — HYDRALAZINE 50 MG TABLET
50.0000 mg | ORAL_TABLET | Freq: Two times a day (BID) | ORAL | 0 refills | Status: AC
Start: 2021-07-27 — End: 2021-08-26

## 2021-07-27 NOTE — Discharge Instructions (Signed)
Based on the blood pressure identified during your ED visit, it is encouraged that you keep a diary of your blood pressures and follow up with your primary care provider for more definitive care and management of your blood pressure.  Lifestyle changes may be suggested including, weight loss and dietary restrictions.

## 2021-07-27 NOTE — ED Nurses Note (Signed)
Resting quietly with eyes closed, sleeping when undisturbed. Vital signs stable, no needs noted or voiced.Gae Gallop, RN

## 2021-07-27 NOTE — ED Nurses Note (Signed)
Report to [facility], sending a ride for patient. Assisted to waiting room to await ride. Verbalized understanding of discharge instructions and self care.Gae Gallop, RN

## 2021-07-29 DIAGNOSIS — R9431 Abnormal electrocardiogram [ECG] [EKG]: Secondary | ICD-10-CM

## 2021-07-29 LAB — ECG 12 LEAD
Atrial Rate: 82 {beats}/min
Calculated P Axis: 51 degrees
Calculated R Axis: 10 degrees
Calculated T Axis: 258 degrees
PR Interval: 162 ms
QRS Duration: 74 ms
QT Interval: 368 ms
QTC Calculation: 429 ms
Ventricular rate: 82 {beats}/min

## 2021-08-29 ENCOUNTER — Other Ambulatory Visit: Payer: Self-pay

## 2021-09-18 ENCOUNTER — Telehealth: Payer: Self-pay | Admitting: Pharmacist

## 2021-09-18 NOTE — Telephone Encounter (Signed)
Patient attempted to be outreached by Ahmaya Smalls Daly, PharmD Candidate on 09/18/2021 to discuss hypertension. Left voicemail for patient to return our call at their convenience.   Catie T. Taleia Sadowski, PharmD, BCACP  [facility]  [phone]  

## 2021-09-23 ENCOUNTER — Ambulatory Visit: Payer: Medicaid Other | Admitting: Family Medicine

## 2022-04-19 LAB — FECAL OCCULT BLOOD, GUAIAC: Fecal Occult Blood: NEGATIVE
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
## 2022-05-26 ENCOUNTER — Encounter: Payer: Self-pay | Admitting: Family Medicine

## 2023-07-31 ENCOUNTER — Encounter (HOSPITAL_COMMUNITY): Payer: Self-pay | Admitting: Interventional Radiology
